# Patient Record
Sex: Male | Born: 1950 | Race: White | Hispanic: No | Marital: Married | Smoking: Former smoker
Health system: Southern US, Community
[De-identification: ages and names within clinical notes are randomized; demographics above are authoritative.]

## PROBLEM LIST (undated history)

## (undated) DIAGNOSIS — F419 Anxiety disorder, unspecified: Secondary | ICD-10-CM

## (undated) DIAGNOSIS — E785 Hyperlipidemia, unspecified: Secondary | ICD-10-CM

## (undated) DIAGNOSIS — J449 Chronic obstructive pulmonary disease, unspecified: Secondary | ICD-10-CM

## (undated) DIAGNOSIS — F329 Major depressive disorder, single episode, unspecified: Secondary | ICD-10-CM

## (undated) DIAGNOSIS — F32A Depression, unspecified: Secondary | ICD-10-CM

## (undated) DIAGNOSIS — N189 Chronic kidney disease, unspecified: Secondary | ICD-10-CM

## (undated) DIAGNOSIS — C801 Malignant (primary) neoplasm, unspecified: Secondary | ICD-10-CM

## (undated) DIAGNOSIS — I10 Essential (primary) hypertension: Secondary | ICD-10-CM

## (undated) DIAGNOSIS — E119 Type 2 diabetes mellitus without complications: Secondary | ICD-10-CM

## (undated) HISTORY — DX: Hyperlipidemia, unspecified: E78.5

## (undated) HISTORY — PX: EYE SURGERY: SHX253

## (undated) HISTORY — PX: MANDIBLE SURGERY: SHX707

## (undated) HISTORY — DX: Chronic obstructive pulmonary disease, unspecified: J44.9

## (undated) HISTORY — DX: Essential (primary) hypertension: I10

## (undated) HISTORY — DX: Chronic kidney disease, unspecified: N18.9

## (undated) HISTORY — DX: Depression, unspecified: F32.A

## (undated) HISTORY — DX: Type 2 diabetes mellitus without complications: E11.9

## (undated) HISTORY — DX: Major depressive disorder, single episode, unspecified: F32.9

## (undated) HISTORY — DX: Anxiety disorder, unspecified: F41.9

---

## 2011-10-24 ENCOUNTER — Ambulatory Visit: Payer: Self-pay | Admitting: Cardiovascular Disease

## 2012-03-16 ENCOUNTER — Ambulatory Visit: Payer: Self-pay | Admitting: Ophthalmology

## 2014-02-03 LAB — LIPID PANEL
Cholesterol: 152 mg/dL (ref 0–200)
HDL: 29 mg/dL — AB (ref 35–70)
LDL CALC: 87 mg/dL
Triglycerides: 183 mg/dL — AB (ref 40–160)

## 2014-02-03 LAB — BASIC METABOLIC PANEL
BUN: 27 mg/dL — AB (ref 4–21)
Creatinine: 1.5 mg/dL — AB (ref 0.6–1.3)
GLUCOSE: 128 mg/dL
Potassium: 5.2 mmol/L (ref 3.4–5.3)
Sodium: 140 mmol/L (ref 137–147)

## 2014-02-03 LAB — HEMOGLOBIN A1C: Hgb A1c MFr Bld: 6.3 % — AB (ref 4.0–6.0)

## 2014-04-24 ENCOUNTER — Inpatient Hospital Stay: Payer: Self-pay | Admitting: Internal Medicine

## 2014-04-24 LAB — BASIC METABOLIC PANEL
Anion Gap: 11 (ref 7–16)
BUN: 22 mg/dL — AB (ref 7–18)
CALCIUM: 8.8 mg/dL (ref 8.5–10.1)
Chloride: 105 mmol/L (ref 98–107)
Co2: 22 mmol/L (ref 21–32)
Creatinine: 1.72 mg/dL — ABNORMAL HIGH (ref 0.60–1.30)
EGFR (African American): 52 — ABNORMAL LOW
GFR CALC NON AF AMER: 43 — AB
Glucose: 240 mg/dL — ABNORMAL HIGH (ref 65–99)
OSMOLALITY: 287 (ref 275–301)
POTASSIUM: 3.7 mmol/L (ref 3.5–5.1)
SODIUM: 138 mmol/L (ref 136–145)

## 2014-04-24 LAB — CBC
HCT: 45 % (ref 40.0–52.0)
HGB: 14.7 g/dL (ref 13.0–18.0)
MCH: 30.7 pg (ref 26.0–34.0)
MCHC: 32.6 g/dL (ref 32.0–36.0)
MCV: 94 fL (ref 80–100)
Platelet: 311 10*3/uL (ref 150–440)
RBC: 4.78 10*6/uL (ref 4.40–5.90)
RDW: 13.3 % (ref 11.5–14.5)
WBC: 20 10*3/uL — ABNORMAL HIGH (ref 3.8–10.6)

## 2014-04-24 LAB — TROPONIN I

## 2014-04-25 LAB — CBC WITH DIFFERENTIAL/PLATELET
BASOS PCT: 0.1 %
Basophil #: 0 10*3/uL (ref 0.0–0.1)
EOS ABS: 0 10*3/uL (ref 0.0–0.7)
Eosinophil %: 0 %
HCT: 40.4 % (ref 40.0–52.0)
HGB: 12.8 g/dL — ABNORMAL LOW (ref 13.0–18.0)
Lymphocyte #: 0.6 10*3/uL — ABNORMAL LOW (ref 1.0–3.6)
Lymphocyte %: 3.3 %
MCH: 30.6 pg (ref 26.0–34.0)
MCHC: 31.8 g/dL — ABNORMAL LOW (ref 32.0–36.0)
MCV: 96 fL (ref 80–100)
Monocyte #: 0.6 x10 3/mm (ref 0.2–1.0)
Monocyte %: 3.5 %
Neutrophil #: 16.5 10*3/uL — ABNORMAL HIGH (ref 1.4–6.5)
Neutrophil %: 93.1 %
Platelet: 255 10*3/uL (ref 150–440)
RBC: 4.2 10*6/uL — AB (ref 4.40–5.90)
RDW: 13.3 % (ref 11.5–14.5)
WBC: 17.7 10*3/uL — AB (ref 3.8–10.6)

## 2014-04-25 LAB — BASIC METABOLIC PANEL
Anion Gap: 4 — ABNORMAL LOW (ref 7–16)
BUN: 29 mg/dL — ABNORMAL HIGH (ref 7–18)
CREATININE: 1.5 mg/dL — AB (ref 0.60–1.30)
Calcium, Total: 8.4 mg/dL — ABNORMAL LOW (ref 8.5–10.1)
Chloride: 111 mmol/L — ABNORMAL HIGH (ref 98–107)
Co2: 24 mmol/L (ref 21–32)
GLUCOSE: 177 mg/dL — AB (ref 65–99)
Osmolality: 288 (ref 275–301)
POTASSIUM: 3.6 mmol/L (ref 3.5–5.1)
SODIUM: 139 mmol/L (ref 136–145)

## 2014-05-01 LAB — BASIC METABOLIC PANEL
BUN: 21 mg/dL (ref 4–21)
Creatinine: 1.2 mg/dL (ref 0.6–1.3)
Glucose: 165 mg/dL
Potassium: 5 mmol/L (ref 3.4–5.3)
SODIUM: 139 mmol/L (ref 137–147)

## 2014-05-01 LAB — CBC AND DIFFERENTIAL
HCT: 43 % (ref 41–53)
Hemoglobin: 14.3 g/dL (ref 13.5–17.5)
Neutrophils Absolute: 11 /uL
Platelets: 277 10*3/uL (ref 150–399)
WBC: 14.5 10^3/mL

## 2014-05-01 LAB — HEPATIC FUNCTION PANEL
ALK PHOS: 42 U/L (ref 25–125)
ALT: 18 U/L (ref 10–40)
AST: 16 U/L (ref 14–40)

## 2014-05-01 LAB — HEMOGLOBIN A1C: Hgb A1c MFr Bld: 6.7 % — AB (ref 4.0–6.0)

## 2014-07-12 ENCOUNTER — Ambulatory Visit: Payer: Self-pay | Admitting: Ophthalmology

## 2014-08-15 LAB — BASIC METABOLIC PANEL WITH GFR
BUN: 34 mg/dL — AB (ref 4–21)
Creatinine: 1.9 mg/dL — AB (ref 0.6–1.3)
Glucose: 251 mg/dL
Potassium: 4.7 mmol/L (ref 3.4–5.3)
Sodium: 134 mmol/L — AB (ref 137–147)

## 2014-08-21 ENCOUNTER — Encounter: Payer: Self-pay | Admitting: *Deleted

## 2014-09-07 ENCOUNTER — Ambulatory Visit: Payer: Self-pay | Admitting: Endocrinology

## 2014-11-11 NOTE — Discharge Summary (Signed)
PATIENT NAME:  Ralph Lopez, MOLL MR#:  [id number] DATE OF BIRTH:  14-Mar-1951  DATE OF ADMISSION:  04/24/2014 DATE OF DISCHARGE:  04/25/2014  DISCHARGE DIAGNOSES:  1.  Chronic obstructive pulmonary disease exacerbation.   2.  Hypertension.  3.  Acute on chronic renal failure with chronic kidney disease stage III.   4.  Type 2 diabetes mellitus with nephropathy.  5.  Essential hypertension.   DISCHARGE MEDICATIONS:  Aspirin 81 mg p.o. daily, ProAir 90 mcg 2 puffs 4 times daily, atorvastatin 10 mg p.o. daily, amlodipine 5 mg p.o. daily, pantoprazole 40 mg p.o. daily, HCTZ 25 mg p.o. daily, atenolol 100 mg p.o. daily, Spiriva 18 mcg inhalation daily, lisinopril 40 mg p.o. daily, prednisone 20 mg 3 tablets daily for 3 days, 2 tablets daily for 3 days, 1 tablet daily for 3 days, then stop, azithromycin 500 mg daily for 3 days, Augmentin 875-125 mg p.o. b.i.d. for 10 days.   DIET: Low-sodium, low-fat diet.     Note that the patient metformin was stopped with primary doctor a week ago because of renal failure. The patient right now is not on any diabetes medication, so I advised him to check with his primary doctor in 2 days and possibly start insulin.  The patient's sugars were high here, but it is due to steroids. He does have a glucometer, I advised him to check blood sugar at least 2 times a day and keep the log and take it to the primary doctor. The patient had appointment made with his primary doctor on October 12 at 10:45.     HOSPITAL COURSE: The patient is a 64 year old male patient with tobacco abuse, comes in because of trouble breathing, wheezing, and cough. Look at history and physical for full details on October 5.   1.  The patient's chest x-ray did not show any pneumonia. The patient was started on BiPAP because of shortness of breath and wheezing. The patient's white count was 20,000 on admission. The patient admitted to hospitalist service for COPD exacerbation and the patient was able to  come off the BiPAP and he came to the floor. We continued Rocephin, Zithromax, Solu-Medrol, and Duonebs.  Yesterday the patient's wheezing got better, white count dropped to 17, and his O2 saturation improved nicely. The patient did not require any further oxygen. The patient's O2 saturations were 94% on room air at rest and 90% with exertion. He is discharged home with prednisone, antibiotics, and also nebulizers. He is advised to follow with primary doctor to set up pulmonology appointment and pulmonary function testing as outpatient.  We have added Spiriva to his medications needed diet. I advised him to quit smoking.  2.  Acute on chronic renal failure, CKD stage III , secondary to diabetic nephropathy. The patient's kidney function showed his creatinine 1.72 on October 5, BUN 22. The patient was given gentle hydration. His creatinine improved to 1.5 on October 6.  3.  The patient's sugars have been a little bit high at 190 and 200s. He is on steroids. I told him to continue to wean off steroids and then talk to his primary doctor to restart possibly other medications like glipizide or insulin. He is off metformin recently a week ago secondary to renal failure.   DISCHARGE VITAL SIGNS: Temperature 97.7, heart rate 89, blood pressure 112/69, saturation 94% on room air at rest and 93% on exertion.   TIME SPENT: More than 30 minutes.     ____________________________ Epifanio Lesches, MD  sk:bu D: 04/26/2014 12:39:04 ET T: 04/26/2014 15:16:10 ET JOB#: [id number]  cc: Epifanio Lesches, MD, <Dictator> Epifanio Lesches MD ELECTRONICALLY SIGNED 05/13/2014 18:30

## 2014-11-11 NOTE — H&P (Signed)
PATIENT NAME:  DEMONTAY, GRANTHAM MR#:  [id number] DATE OF BIRTH:  28-Feb-1951  DATE OF ADMISSION:  04/24/2014  PRIMARY CARE PHYSICIAN:  None.  REFERRING PHYSICIAN:     CHIEF COMPLAINT:  Shortness of breath.  HISTORY OF PRESENT ILLNESS:  Mr. Badeaux is a 64 year old male with past medical history of diabetes mellitus diet controlled, hypertension, hyperlipidemia, and COPD, who comes to the Emergency Department with cough and shortness of breath for the last 2 days. The cough gradually worsened, associated with severe shortness of breath. Denies having any fever. Concerning this, he came to the Emergency Department. The patient was diffusely wheezing. The patient received multiple breathing treatments without much improvement. The patient was placed on BiPAP. ABG showed pH of 7.30, pCO2 of 43. Chest x-ray does not show any infiltrates. The patient received azithromycin by the Emergency Department physician. The patient was also found to have elevated white blood cell count of 20,000.   PAST MEDICAL HISTORY:   1.  Hypertension. 2.  Diabetes mellitus. 3.  COPD. 4.  Gastroesophageal reflux disease.  ALLERGIES:  No known drug allergies.  HOME MEDICATIONS: 1.  Spiriva 18 mcg once a day. 2.  ProAir 2 puffs 4 times a day. 3.  Protonix 40 mg once a day. 4.  Lisinopril 40 mg once a day. 5.  Hydrochlorothiazide 25 mg once a day. 6.  Atorvastatin 10 mg once a day. 7.  Atenolol 100 mg once a day. 8.  Aspirin 81 mg daily. 9.  Amlodipine 5 mg once a day.  SOCIAL HISTORY:  Continues to smoke 1-1/2 packs a day. Denies drinking alcohol or using illicit drugs. Currently married, lives with his wife.   FAMILY HISTORY:  Hypertension and diabetes mellitus.  REVIEW OF SYSTEMS: CONSTITUTIONAL:  Experiencing generalized weakness. EYES:  No change in vision. EARS, NOSE, AND THROAT:  No change in hearing. RESPIRATORY:  Has cough and shortness of breath. CARDIOVASCULAR:  No chest pain or  palpitations. GASTROINTESTINAL:  No nausea, vomiting, or abdominal pain. GENITOURINARY:  No dysuria or hematuria. HEMATOLOGIC:  No easy bruising or bleeding. SKIN:  No rash or lesions. MUSCULOSKELETAL:  No joint pains and aches. NEUROLOGIC:  No weakness or numbness in any part of the body.  PHYSICAL EXAMINATION: GENERAL:  Well-built, well-nourished, age-appropriate male lying down in the bed not in distress. VITAL SIGNS:  Temperature 98, pulse 132, blood pressure 117/79, respiratory rate 26, oxygen saturation 100% on BiPAP. HEENT:  Head is normocephalic and atraumatic. There is no scleral icterus. Conjunctivae are normal. Pupils are equal, round, and react to light. Mucous membranes are moist. No pharyngeal erythema.  NECK:  Supple. No lymphadenopathy. No JVD. No carotid bruit. CHEST:  Has no focal tenderness. Bilateral decreased wheezing. HEART:  S1, S2 regular. No murmurs are heard. ABDOMEN:  Bowel sounds are present. Soft, nontender, nondistended.  EXTREMITIES:  No pedal edema. Pulses are 2+.  NEUROLOGIC:  The patient is alert and oriented to place, person, and time. Cranial nerves II through XII are intact. Motor is 5/5 in upper and lower extremities.   LABORATORY DATA:  CMP:  BUN 22, creatinine 1.72, glucose 240. CBC:  WBC 20,000, hemoglobin 14, platelet count 311.  ASSESSMENT AND PLAN:  Mr. Elahi is a 64 year old male with a history of heavy smoking, who comes to the Emergency Department with chronic obstructive pulmonary disease exacerbation.   1.  Chronic obstructive pulmonary disease exacerbation. Will continue the breathing treatments and Solu-Medrol. The patient is able to speak in full sentences.  The patient has bilateral diffuse wheezing and has air entry bilaterally. Will admit the patient to a medical bed. Continue DuoNeb and Solu-Medrol, also Rocephin and Zithromax concerning the patient's elevated white blood cell count. Chest x-ray does not show any infiltrates.  2.   Hypertension. Continue the home medications and hold hydrochlorothiazide.  3.  Renal insufficiency. We do not have the patient's baseline. Hold the hydrochlorothiazide, give gentle hydration, and follow up, as well as hold lisinopril.  4.  Tobacco use. Counsel the patient.  5.  Keep the patient on deep vein thrombosis prophylaxis with Lovenox.  TIME SPENT:  55 minutes.   ____________________________ Monica Becton, MD pv:nb D: 04/24/2014 02:56:10 ET T: 04/24/2014 03:06:19 ET JOB#: [id number]  cc: Monica Becton, MD, <Dictator> Monica Becton MD ELECTRONICALLY SIGNED 05/03/2014 22:20

## 2015-02-15 ENCOUNTER — Telehealth: Payer: Self-pay | Admitting: Unknown Physician Specialty

## 2015-02-15 DIAGNOSIS — E1122 Type 2 diabetes mellitus with diabetic chronic kidney disease: Secondary | ICD-10-CM

## 2015-02-15 DIAGNOSIS — F419 Anxiety disorder, unspecified: Secondary | ICD-10-CM | POA: Insufficient documentation

## 2015-02-15 DIAGNOSIS — E785 Hyperlipidemia, unspecified: Secondary | ICD-10-CM

## 2015-02-15 DIAGNOSIS — E1022 Type 1 diabetes mellitus with diabetic chronic kidney disease: Secondary | ICD-10-CM

## 2015-02-15 DIAGNOSIS — E119 Type 2 diabetes mellitus without complications: Secondary | ICD-10-CM | POA: Insufficient documentation

## 2015-02-15 DIAGNOSIS — N183 Chronic kidney disease, stage 3 unspecified: Secondary | ICD-10-CM | POA: Insufficient documentation

## 2015-02-15 DIAGNOSIS — F32A Depression, unspecified: Secondary | ICD-10-CM

## 2015-02-15 DIAGNOSIS — F329 Major depressive disorder, single episode, unspecified: Secondary | ICD-10-CM

## 2015-02-15 DIAGNOSIS — I129 Hypertensive chronic kidney disease with stage 1 through stage 4 chronic kidney disease, or unspecified chronic kidney disease: Secondary | ICD-10-CM

## 2015-02-15 DIAGNOSIS — J449 Chronic obstructive pulmonary disease, unspecified: Secondary | ICD-10-CM | POA: Insufficient documentation

## 2015-02-15 NOTE — Telephone Encounter (Signed)
Routing to provider. Called and asked patient what was going on with his elbow. He stated he thinks it may be tendonitis again, he can't straighten out his arm, and it hurts very bad.

## 2015-02-15 NOTE — Telephone Encounter (Signed)
Pt requests call back. Pt having issues with his elbow, wants to know if something can be called in for him. Pharm is [facility] in [place]. Thanks.

## 2015-02-16 ENCOUNTER — Encounter: Payer: Self-pay | Admitting: Unknown Physician Specialty

## 2015-02-16 ENCOUNTER — Ambulatory Visit (INDEPENDENT_AMBULATORY_CARE_PROVIDER_SITE_OTHER): Payer: BLUE CROSS/BLUE SHIELD | Admitting: Unknown Physician Specialty

## 2015-02-16 VITALS — BP 139/81 | HR 73 | Temp 97.6°F | Ht 69.7 in | Wt 171.8 lb

## 2015-02-16 DIAGNOSIS — M7702 Medial epicondylitis, left elbow: Secondary | ICD-10-CM | POA: Diagnosis not present

## 2015-02-16 MED ORDER — METHYLPREDNISOLONE 4 MG PO TBPK: ORAL_TABLET | ORAL | Status: DC

## 2015-02-16 MED ORDER — HYDROCODONE-ACETAMINOPHEN 5-325 MG PO TABS: 1.0000 | ORAL_TABLET | Freq: Four times a day (QID) | ORAL | Status: DC | PRN

## 2015-02-16 NOTE — Telephone Encounter (Signed)
It sounds like tennis elbow.  I recommend ice or heat.  Aspercream might be helpful.  Needs to get a tennis elbow brace you can get at the drug store.

## 2015-02-16 NOTE — Progress Notes (Signed)
BP 139/81 mmHg  Pulse 73  Temp(Src) 97.6 F (36.4 C)  Ht 5' 9.7" (1.77 m)  Wt 171 lb 12.8 oz (77.928 kg)  BMI 24.87 kg/m2  SpO2 98%   Subjective:    Patient ID: Ralph Lopez, male    DOB: Jul 12, 1951, 64 y.o.   MRN: [id number]  HPI: Ralph Lopez is a 64 y.o. male  Chief Complaint  Patient presents with  . Elbow Pain    pain is in left elbow and pt states pain started a couple of days ago.   Left elbow pain started suddenly a couple of days ago after repetitive lifting of crates.  He took the day off from work.  He works at [employer] in [employer] and has not made a [place] claim.  Now he cannot extend elbow and is tender to touch.    Relevant past medical, surgical, family and social history reviewed and updated as indicated. Interim medical history since our last visit reviewed. Allergies and medications reviewed and updated.  Review of Systems  Per HPI unless specifically indicated above     Objective:    BP 139/81 mmHg  Pulse 73  Temp(Src) 97.6 F (36.4 C)  Ht 5' 9.7" (1.77 m)  Wt 171 lb 12.8 oz (77.928 kg)  BMI 24.87 kg/m2  SpO2 98%  Wt Readings from Last 3 Encounters:  02/16/15 171 lb 12.8 oz (77.928 kg)  11/29/14 170 lb (77.111 kg)    Physical Exam  Musculoskeletal:       Left elbow: He exhibits swelling and effusion. Tenderness found. Medial epicondyle tenderness noted.    Results for orders placed or performed in visit on 69/67/89  Basic metabolic panel  Result Value Ref Range   Glucose 251 mg/dL   BUN 34 (A) 4 - 21 mg/dL   Creatinine 1.9 (A) 0.6 - 1.3 mg/dL   Potassium 4.7 3.4 - 5.3 mmol/L   Sodium 134 (A) 137 - 147 mmol/L  CBC and differential  Result Value Ref Range   Hemoglobin 14.3 13.5 - 17.5 g/dL   HCT 43 41 - 53 %   Neutrophils Absolute 11 /L   Platelets 277 150 - 399 K/L   WBC 14.5 38^1/OF  Basic metabolic panel  Result Value Ref Range   Glucose 165 mg/dL   BUN 21 4 - 21 mg/dL   Creatinine 1.2 0.6 - 1.3 mg/dL   Potassium 5.0 3.4 -  5.3 mmol/L   Sodium 139 137 - 147 mmol/L  Hepatic function panel  Result Value Ref Range   Alkaline Phosphatase 42 25 - 125 U/L   ALT 18 10 - 40 U/L   AST 16 14 - 40 U/L  Hemoglobin A1c  Result Value Ref Range   Hgb A1c MFr Bld 6.7 (A) 4.0 - 6.0 %  Basic metabolic panel  Result Value Ref Range   Glucose 128 mg/dL   BUN 27 (A) 4 - 21 mg/dL   Creatinine 1.5 (A) 0.6 - 1.3 mg/dL   Potassium 5.2 3.4 - 5.3 mmol/L   Sodium 140 137 - 147 mmol/L  Lipid panel  Result Value Ref Range   Triglycerides 183 (A) 40 - 160 mg/dL   Cholesterol 152 0 - 200 mg/dL   HDL 29 (A) 35 - 70 mg/dL   LDL Cholesterol 87 mg/dL  Hemoglobin A1c  Result Value Ref Range   Hgb A1c MFr Bld 6.3 (A) 4.0 - 6.0 %      Assessment & Plan:  Problem List Items Addressed This Visit    None    Visit Diagnoses    Epicondylitis elbow, medial, left    -  Primary    This seems to be a repetitive work injury.  Needs to report it for workmen's comp to get the appropriate work accomodations needed until this heals.      Relevant Medications    methylPREDNISolone (MEDROL DOSEPAK) 4 MG TBPK tablet    HYDROcodone-acetaminophen (NORCO/VICODIN) 5-325 MG per tablet        Follow up plan: Return Workman's comp doctor.

## 2015-02-16 NOTE — Telephone Encounter (Signed)
Patient came in for an appointment and I let him know what Malachy Mood suggested then.

## 2015-02-16 NOTE — Patient Instructions (Addendum)
Talk to your supervisor about a Workman's comp claim so appropriate work accomodations can be made.   Use a sling

## 2015-02-19 ENCOUNTER — Ambulatory Visit: Payer: Self-pay | Admitting: Unknown Physician Specialty

## 2015-03-04 ENCOUNTER — Other Ambulatory Visit: Payer: Self-pay | Admitting: Unknown Physician Specialty

## 2015-03-08 ENCOUNTER — Other Ambulatory Visit: Payer: Self-pay | Admitting: Unknown Physician Specialty

## 2015-03-27 ENCOUNTER — Other Ambulatory Visit: Payer: Self-pay | Admitting: Unknown Physician Specialty

## 2015-05-01 ENCOUNTER — Other Ambulatory Visit: Payer: Self-pay | Admitting: Unknown Physician Specialty

## 2015-06-01 ENCOUNTER — Other Ambulatory Visit: Payer: Self-pay | Admitting: Unknown Physician Specialty

## 2015-06-25 ENCOUNTER — Other Ambulatory Visit: Payer: Self-pay | Admitting: Unknown Physician Specialty

## 2015-07-03 ENCOUNTER — Ambulatory Visit (INDEPENDENT_AMBULATORY_CARE_PROVIDER_SITE_OTHER): Payer: BLUE CROSS/BLUE SHIELD | Admitting: Unknown Physician Specialty

## 2015-07-03 ENCOUNTER — Encounter: Payer: Self-pay | Admitting: Unknown Physician Specialty

## 2015-07-03 VITALS — BP 138/82 | HR 73 | Temp 98.3°F | Ht 69.7 in | Wt 170.8 lb

## 2015-07-03 DIAGNOSIS — I129 Hypertensive chronic kidney disease with stage 1 through stage 4 chronic kidney disease, or unspecified chronic kidney disease: Secondary | ICD-10-CM

## 2015-07-03 DIAGNOSIS — Z794 Long term (current) use of insulin: Secondary | ICD-10-CM | POA: Diagnosis not present

## 2015-07-03 DIAGNOSIS — N183 Chronic kidney disease, stage 3 unspecified: Secondary | ICD-10-CM

## 2015-07-03 DIAGNOSIS — E785 Hyperlipidemia, unspecified: Secondary | ICD-10-CM | POA: Diagnosis not present

## 2015-07-03 DIAGNOSIS — Z23 Encounter for immunization: Secondary | ICD-10-CM | POA: Diagnosis not present

## 2015-07-03 DIAGNOSIS — E1122 Type 2 diabetes mellitus with diabetic chronic kidney disease: Secondary | ICD-10-CM

## 2015-07-03 LAB — LIPID PANEL PICCOLO, WAIVED
CHOLESTEROL PICCOLO, WAIVED: 135 mg/dL (ref <200)
Chol/HDL Ratio Piccolo,Waive: 3.9 mg/dL
HDL CHOL PICCOLO, WAIVED: 35 mg/dL — AB (ref >59)
LDL CHOL CALC PICCOLO WAIVED: 81 mg/dL (ref <100)
Triglycerides Piccolo,Waived: 94 mg/dL (ref <150)
VLDL Chol Calc Piccolo,Waive: 19 mg/dL (ref <30)

## 2015-07-03 LAB — MICROALBUMIN, URINE WAIVED
CREATININE, URINE WAIVED: 200 mg/dL (ref 10–300)
Microalb, Ur Waived: 80 mg/L — ABNORMAL HIGH (ref 0–19)

## 2015-07-03 LAB — BAYER DCA HB A1C WAIVED: HB A1C (BAYER DCA - WAIVED): 6.3 % (ref <7.0)

## 2015-07-03 NOTE — Progress Notes (Signed)
BP 138/82 mmHg  Pulse 73  Temp(Src) 98.3 F (36.8 C)  Ht 5' 9.7" (1.77 m)  Wt 170 lb 12.8 oz (77.474 kg)  BMI 24.73 kg/m2  SpO2 98%   Subjective:    Patient ID: Ralph Lopez, male    DOB: 1951-05-26, 64 y.o.   MRN: [id number]  HPI: Ralph Lopez is a 64 y.o. male  Chief Complaint  Patient presents with  . Diabetes  . Hyperlipidemia  . Hypertension   Diabetes:  Using medications without difficulties Taking 30 units of Toujeo/day No hypoglycemic episodes No hyperglycemic episodes Feet problems: none Blood Sugars averaging: BS 119 this AM and typically there.   eye exam within last year  Hypertension:  Using medications without difficulty  Using medication without problems or lightheadedness No chest pain with exertion or shortness of breath No Edema  Elevated Cholesterol Using medications without problems: No Muscle aches  Diet compliance: OK Exercise: works a physical job    Relevant past medical, surgical, family and social history reviewed and updated as indicated. Interim medical history since our last visit reviewed. Allergies and medications reviewed and updated.  Review of Systems  Per HPI unless specifically indicated above     Objective:    BP 138/82 mmHg  Pulse 73  Temp(Src) 98.3 F (36.8 C)  Ht 5' 9.7" (1.77 m)  Wt 170 lb 12.8 oz (77.474 kg)  BMI 24.73 kg/m2  SpO2 98%  Wt Readings from Last 3 Encounters:  07/03/15 170 lb 12.8 oz (77.474 kg)  02/16/15 171 lb 12.8 oz (77.928 kg)  11/29/14 170 lb (77.111 kg)    Physical Exam  Constitutional: He is oriented to person, place, and time. He appears well-developed and well-nourished. No distress.  HENT:  Head: Normocephalic and atraumatic.  Eyes: Conjunctivae and lids are normal. Right eye exhibits no discharge. Left eye exhibits no discharge. No scleral icterus.  Neck: Normal range of motion. Neck supple. No JVD present. Carotid bruit is not present.  Cardiovascular: Normal rate,  regular rhythm and normal heart sounds.   Pulmonary/Chest: Effort normal and breath sounds normal. No respiratory distress.  Abdominal: Normal appearance. There is no splenomegaly or hepatomegaly.  Musculoskeletal: Normal range of motion.  Neurological: He is alert and oriented to person, place, and time.  Skin: Skin is warm, dry and intact. No rash noted. No pallor.  Psychiatric: He has a normal mood and affect. His behavior is normal. Judgment and thought content normal.     Assessment & Plan:   Problem List Items Addressed This Visit      Unprioritized   CKD (chronic kidney disease), stage III   Relevant Orders   Comprehensive metabolic panel   Hypertensive CKD (chronic kidney disease)   Relevant Orders   Microalbumin, Urine Waived   Uric acid   Hyperlipidemia    LDL is 81      Relevant Medications   atorvastatin (LIPITOR) 10 MG tablet   Other Relevant Orders   Comprehensive metabolic panel   Lipid Panel Piccolo, Waived   Diabetes ([place])    Hgb A1C is 6.3      Relevant Medications   atorvastatin (LIPITOR) 10 MG tablet   Other Relevant Orders   Comprehensive metabolic panel   Bayer DCA Hb A1c Waived    Other Visit Diagnoses    Immunization due    -  Primary    Relevant Orders    Flu Vaccine QUAD 36+ mos IM (Completed)  All diagnosis stable.  Continue present treatment Follow up plan: Return in about 6 months (around 01/01/2016) for physical.

## 2015-07-03 NOTE — Assessment & Plan Note (Signed)
LDL is 81

## 2015-07-03 NOTE — Assessment & Plan Note (Signed)
Hgb A1C is 6.3

## 2015-07-04 LAB — URIC ACID: URIC ACID: 9.1 mg/dL — AB (ref 3.7–8.6)

## 2015-07-04 LAB — COMPREHENSIVE METABOLIC PANEL
ALK PHOS: 63 IU/L (ref 39–117)
ALT: 8 IU/L (ref 0–44)
AST: 14 IU/L (ref 0–40)
Albumin/Globulin Ratio: 1.3 (ref 1.1–2.5)
Albumin: 3.9 g/dL (ref 3.6–4.8)
BILIRUBIN TOTAL: 0.2 mg/dL (ref 0.0–1.2)
BUN/Creatinine Ratio: 15 (ref 10–22)
BUN: 22 mg/dL (ref 8–27)
CHLORIDE: 105 mmol/L (ref 96–106)
CO2: 23 mmol/L (ref 18–29)
Calcium: 9.5 mg/dL (ref 8.6–10.2)
Creatinine, Ser: 1.51 mg/dL — ABNORMAL HIGH (ref 0.76–1.27)
GFR calc Af Amer: 56 mL/min/{1.73_m2} — ABNORMAL LOW (ref >59)
GFR calc non Af Amer: 48 mL/min/{1.73_m2} — ABNORMAL LOW (ref >59)
GLUCOSE: 106 mg/dL — AB (ref 65–99)
Globulin, Total: 3 g/dL (ref 1.5–4.5)
Potassium: 5.1 mmol/L (ref 3.5–5.2)
Sodium: 141 mmol/L (ref 134–144)
Total Protein: 6.9 g/dL (ref 6.0–8.5)

## 2015-07-10 ENCOUNTER — Other Ambulatory Visit: Payer: Self-pay | Admitting: Unknown Physician Specialty

## 2015-07-27 ENCOUNTER — Other Ambulatory Visit: Payer: Self-pay | Admitting: Family Medicine

## 2015-07-30 ENCOUNTER — Other Ambulatory Visit: Payer: Self-pay | Admitting: Unknown Physician Specialty

## 2015-08-28 ENCOUNTER — Other Ambulatory Visit: Payer: Self-pay | Admitting: Unknown Physician Specialty

## 2015-09-13 ENCOUNTER — Other Ambulatory Visit: Payer: Self-pay | Admitting: Unknown Physician Specialty

## 2015-09-30 ENCOUNTER — Other Ambulatory Visit: Payer: Self-pay | Admitting: Unknown Physician Specialty

## 2015-10-01 ENCOUNTER — Other Ambulatory Visit: Payer: Self-pay | Admitting: Unknown Physician Specialty

## 2015-10-01 NOTE — Telephone Encounter (Signed)
Ralph Lopez refilled his atenolol for 3 months in February, so he shouldn't be due

## 2015-11-02 ENCOUNTER — Other Ambulatory Visit: Payer: Self-pay | Admitting: Unknown Physician Specialty

## 2015-12-14 ENCOUNTER — Other Ambulatory Visit: Payer: Self-pay | Admitting: Unknown Physician Specialty

## 2015-12-18 ENCOUNTER — Telehealth: Payer: Self-pay

## 2015-12-18 NOTE — Telephone Encounter (Signed)
Patient's wife called back. She stated that they did not use this pharmacy and patient does not use any of the meds being requested.

## 2015-12-18 NOTE — Telephone Encounter (Signed)
Called to speak to patient about some refill requests we got from a pharmacy that is not listed in the chart and neither are the medications being requested. I left the patient a voicemail asking for him to please return my call.

## 2015-12-31 ENCOUNTER — Other Ambulatory Visit: Payer: Self-pay | Admitting: Unknown Physician Specialty

## 2016-01-02 ENCOUNTER — Ambulatory Visit (INDEPENDENT_AMBULATORY_CARE_PROVIDER_SITE_OTHER): Payer: BLUE CROSS/BLUE SHIELD | Admitting: Unknown Physician Specialty

## 2016-01-02 ENCOUNTER — Encounter: Payer: Self-pay | Admitting: Unknown Physician Specialty

## 2016-01-02 VITALS — BP 137/79 | HR 67 | Temp 98.4°F | Ht 69.1 in | Wt 178.0 lb

## 2016-01-02 DIAGNOSIS — E1122 Type 2 diabetes mellitus with diabetic chronic kidney disease: Secondary | ICD-10-CM

## 2016-01-02 DIAGNOSIS — N183 Chronic kidney disease, stage 3 unspecified: Secondary | ICD-10-CM | POA: Insufficient documentation

## 2016-01-02 DIAGNOSIS — Z794 Long term (current) use of insulin: Secondary | ICD-10-CM | POA: Diagnosis not present

## 2016-01-02 DIAGNOSIS — E785 Hyperlipidemia, unspecified: Secondary | ICD-10-CM | POA: Diagnosis not present

## 2016-01-02 DIAGNOSIS — J449 Chronic obstructive pulmonary disease, unspecified: Secondary | ICD-10-CM

## 2016-01-02 DIAGNOSIS — I10 Essential (primary) hypertension: Secondary | ICD-10-CM | POA: Insufficient documentation

## 2016-01-02 LAB — BAYER DCA HB A1C WAIVED: HB A1C (BAYER DCA - WAIVED): 7.8 % — ABNORMAL HIGH (ref <7.0)

## 2016-01-02 NOTE — Assessment & Plan Note (Signed)
Await lipid panel 

## 2016-01-02 NOTE — Progress Notes (Signed)
BP 137/79 mmHg  Pulse 67  Temp(Src) 98.4 F (36.9 C)  Ht 5' 9.1" (1.755 m)  Wt 178 lb (80.74 kg)  BMI 26.21 kg/m2  SpO2 96%   Subjective:    Patient ID: Ralph Lopez, male    DOB: [birth year], 65 y.o.   MRN: [id number]  HPI: Ralph Lopez is a 66 y.o. male  Chief Complaint  Patient presents with  . Diabetes    pt states last eye exam date in chart is correct  . Hyperlipidemia  . Hypertension   Diabetes: Uses Toujeo 30 units QAM Stopped Bydureon as price increased.  He took his first shot Thursday after a month No hypoglycemic episodes No hyperglycemic episodes Feet problems: none Blood Sugars averaging: It is high eye exam within last year Last Hgb A1C: 6.3  Hypertension  Using medications without difficulty Average home BPs Not checking  Using medication without problems or lightheadedness No chest pain with exertion or shortness of breath No Edema  Elevated Cholesterol Using medications without problems No Muscle aches  Diet: good Exercise: Stays active     Relevant past medical, surgical, family and social history reviewed and updated as indicated. Interim medical history since our last visit reviewed. Allergies and medications reviewed and updated.  Review of Systems  Musculoskeletal:       Complaininf of a lot of joint pain with left shoulder and bilateral knees    Per HPI unless specifically indicated above     Objective:    BP 137/79 mmHg  Pulse 67  Temp(Src) 98.4 F (36.9 C)  Ht 5' 9.1" (1.755 m)  Wt 178 lb (80.74 kg)  BMI 26.21 kg/m2  SpO2 96%  Wt Readings from Last 3 Encounters:  01/02/16 178 lb (80.74 kg)  07/03/15 170 lb 12.8 oz (77.474 kg)  02/16/15 171 lb 12.8 oz (77.928 kg)    Physical Exam  Constitutional: He is oriented to person, place, and time. He appears well-developed and well-nourished. No distress.  HENT:  Head: Normocephalic and atraumatic.  Eyes: Conjunctivae and lids are normal. Right eye exhibits no  discharge. Left eye exhibits no discharge. No scleral icterus.  Neck: Normal range of motion. Neck supple. No JVD present. Carotid bruit is not present.  Cardiovascular: Normal rate, regular rhythm and normal heart sounds.   Pulmonary/Chest: Effort normal and breath sounds normal. No respiratory distress.  Abdominal: Normal appearance. There is no splenomegaly or hepatomegaly.  Musculoskeletal: Normal range of motion.  Neurological: He is alert and oriented to person, place, and time.  Skin: Skin is warm, dry and intact. No rash noted. No pallor.  Psychiatric: He has a normal mood and affect. His behavior is normal. Judgment and thought content normal.    Results for orders placed or performed in visit on 07/03/15  Comprehensive metabolic panel  Result Value Ref Range   Glucose 106 (H) 65 - 99 mg/dL   BUN 22 8 - 27 mg/dL   Creatinine, Ser 1.51 (H) 0.76 - 1.27 mg/dL   GFR calc non Af Amer 48 (L) >59 mL/min/1.73   GFR calc Af Amer 56 (L) >59 mL/min/1.73   BUN/Creatinine Ratio 15 10 - 22   Sodium 141 134 - 144 mmol/L   Potassium 5.1 3.5 - 5.2 mmol/L   Chloride 105 96 - 106 mmol/L   CO2 23 18 - 29 mmol/L   Calcium 9.5 8.6 - 10.2 mg/dL   Total Protein 6.9 6.0 - 8.5 g/dL   Albumin 3.9 3.6 -  4.8 g/dL   Globulin, Total 3.0 1.5 - 4.5 g/dL   Albumin/Globulin Ratio 1.3 1.1 - 2.5   Bilirubin Total 0.2 0.0 - 1.2 mg/dL   Alkaline Phosphatase 63 39 - 117 IU/L   AST 14 0 - 40 IU/L   ALT 8 0 - 44 IU/L  Bayer DCA Hb A1c Waived  Result Value Ref Range   Bayer DCA Hb A1c Waived 6.3 <7.0 %  Lipid Panel Piccolo, Waived  Result Value Ref Range   Cholesterol Piccolo, Waived 135 <200 mg/dL   HDL Chol Piccolo, Waived 35 (L) >59 mg/dL   Triglycerides Piccolo,Waived 94 <150 mg/dL   Chol/HDL Ratio Piccolo,Waive 3.9 mg/dL   LDL Chol Calc Piccolo Waived 81 <100 mg/dL   VLDL Chol Calc Piccolo,Waive 19 <30 mg/dL  Microalbumin, Urine Waived  Result Value Ref Range   Microalb, Ur Waived 80 (H) 0 - 19 mg/L    Creatinine, Urine Waived 200 10 - 300 mg/dL   Microalb/Creat Ratio 30-300 (H) <30 mg/g  Uric acid  Result Value Ref Range   Uric Acid 9.1 (H) 3.7 - 8.6 mg/dL      Assessment & Plan:   Problem List Items Addressed This Visit      Unprioritized   COPD, severe ([place])    Stopped smoking and breathing is  better      Diabetes ([place]) - Primary   Relevant Orders   Comprehensive metabolic panel   Bayer DCA Hb A1c Waived   Essential hypertension    Stable, continue present medications.        Relevant Orders   Comprehensive metabolic panel   Hyperlipidemia    Await lipid panel      Relevant Orders   Lipid Panel w/o Chol/HDL Ratio       Follow up plan: Return for f/u for joint pain and also 3 months for DM.

## 2016-01-02 NOTE — Assessment & Plan Note (Signed)
Stable, continue present medications.   

## 2016-01-02 NOTE — Assessment & Plan Note (Signed)
Stopped smoking and breathing is  better

## 2016-01-02 NOTE — Assessment & Plan Note (Signed)
Hgb A1C is high today at 7.8.  However stopped bydureon and recently restarted

## 2016-01-03 ENCOUNTER — Other Ambulatory Visit: Payer: Self-pay | Admitting: Unknown Physician Specialty

## 2016-01-03 DIAGNOSIS — N183 Chronic kidney disease, stage 3 (moderate): Secondary | ICD-10-CM

## 2016-01-03 LAB — COMPREHENSIVE METABOLIC PANEL
A/G RATIO: 1.3 (ref 1.2–2.2)
ALBUMIN: 4.2 g/dL (ref 3.6–4.8)
ALT: 11 IU/L (ref 0–44)
AST: 12 IU/L (ref 0–40)
Alkaline Phosphatase: 71 IU/L (ref 39–117)
BUN / CREAT RATIO: 12 (ref 10–24)
BUN: 21 mg/dL (ref 8–27)
Bilirubin Total: 0.3 mg/dL (ref 0.0–1.2)
CALCIUM: 9.7 mg/dL (ref 8.6–10.2)
CO2: 21 mmol/L (ref 18–29)
CREATININE: 1.72 mg/dL — AB (ref 0.76–1.27)
Chloride: 102 mmol/L (ref 96–106)
GFR, EST AFRICAN AMERICAN: 48 mL/min/{1.73_m2} — AB (ref >59)
GFR, EST NON AFRICAN AMERICAN: 41 mL/min/{1.73_m2} — AB (ref >59)
GLOBULIN, TOTAL: 3.3 g/dL (ref 1.5–4.5)
Glucose: 218 mg/dL — ABNORMAL HIGH (ref 65–99)
POTASSIUM: 5.4 mmol/L — AB (ref 3.5–5.2)
SODIUM: 142 mmol/L (ref 134–144)
TOTAL PROTEIN: 7.5 g/dL (ref 6.0–8.5)

## 2016-01-03 LAB — LIPID PANEL W/O CHOL/HDL RATIO
Cholesterol, Total: 153 mg/dL (ref 100–199)
HDL: 30 mg/dL — ABNORMAL LOW (ref >39)
LDL CALC: 97 mg/dL (ref 0–99)
Triglycerides: 131 mg/dL (ref 0–149)
VLDL Cholesterol Cal: 26 mg/dL (ref 5–40)

## 2016-01-09 ENCOUNTER — Encounter: Payer: Self-pay | Admitting: Unknown Physician Specialty

## 2016-01-09 ENCOUNTER — Ambulatory Visit (INDEPENDENT_AMBULATORY_CARE_PROVIDER_SITE_OTHER): Payer: BLUE CROSS/BLUE SHIELD | Admitting: Unknown Physician Specialty

## 2016-01-09 VITALS — BP 145/87 | HR 65 | Temp 98.4°F | Ht 69.5 in | Wt 176.4 lb

## 2016-01-09 DIAGNOSIS — M6248 Contracture of muscle, other site: Secondary | ICD-10-CM

## 2016-01-09 DIAGNOSIS — M62838 Other muscle spasm: Secondary | ICD-10-CM | POA: Insufficient documentation

## 2016-01-09 MED ORDER — CYCLOBENZAPRINE HCL 10 MG PO TABS: 10.0000 mg | ORAL_TABLET | Freq: Every day | ORAL | Status: DC

## 2016-01-09 NOTE — Progress Notes (Signed)
BP 145/87 mmHg  Pulse 65  Temp(Src) 98.4 F (36.9 C)  Ht 5' 9.5" (1.765 m)  Wt 176 lb 6.4 oz (80.015 kg)  BMI 25.69 kg/m2  SpO2 97%   Subjective:    Patient ID: Ralph Lopez, male    DOB: April 11, 1951, 65 y.o.   MRN: [id number]  HPI: Ralph Lopez is a 65 y.o. male  Chief Complaint  Patient presents with  . Shoulder Pain    pt states his right shoulder has been hurting for a couple of weeks now    Shoulder pain Pt with a 2 week complaint of right shoulder.  He hasn't slept for 2 nights now.  Worse at night, but not when lying on it.  Its hurts and then is stops.  States it is a dull aching or throbbing pain.  Nothing seems to help.  Has tried ice, heat, and various rubs.    Relevant past medical, surgical, family and social history reviewed and updated as indicated. Interim medical history since our last visit reviewed. Allergies and medications reviewed and updated.  Review of Systems  Per HPI unless specifically indicated above     Objective:    BP 145/87 mmHg  Pulse 65  Temp(Src) 98.4 F (36.9 C)  Ht 5' 9.5" (1.765 m)  Wt 176 lb 6.4 oz (80.015 kg)  BMI 25.69 kg/m2  SpO2 97%  Wt Readings from Last 3 Encounters:  01/09/16 176 lb 6.4 oz (80.015 kg)  01/02/16 178 lb (80.74 kg)  07/03/15 170 lb 12.8 oz (77.474 kg)    Physical Exam  Constitutional: He is oriented to person, place, and time. He appears well-developed and well-nourished. No distress.  HENT:  Head: Normocephalic and atraumatic.  Eyes: Conjunctivae and lids are normal. Right eye exhibits no discharge. Left eye exhibits no discharge. No scleral icterus.  Cardiovascular: Normal rate.   Pulmonary/Chest: Effort normal.  Abdominal: Normal appearance. There is no splenomegaly or hepatomegaly.  Musculoskeletal: Normal range of motion.       Right shoulder: He exhibits normal range of motion, no tenderness, no bony tenderness, no swelling, no effusion, no crepitus, no deformity, no laceration, no pain,  no spasm, normal pulse and normal strength.  Tender upper trapezius right side.  Shoulder exam normal  Neurological: He is alert and oriented to person, place, and time.  Skin: Skin is intact. No rash noted. No pallor.  Psychiatric: He has a normal mood and affect. His behavior is normal. Judgment and thought content normal.    Results for orders placed or performed in visit on 01/02/16  Comprehensive metabolic panel  Result Value Ref Range   Glucose 218 (H) 65 - 99 mg/dL   BUN 21 8 - 27 mg/dL   Creatinine, Ser 1.72 (H) 0.76 - 1.27 mg/dL   GFR calc non Af Amer 41 (L) >59 mL/min/1.73   GFR calc Af Amer 48 (L) >59 mL/min/1.73   BUN/Creatinine Ratio 12 10 - 24   Sodium 142 134 - 144 mmol/L   Potassium 5.4 (H) 3.5 - 5.2 mmol/L   Chloride 102 96 - 106 mmol/L   CO2 21 18 - 29 mmol/L   Calcium 9.7 8.6 - 10.2 mg/dL   Total Protein 7.5 6.0 - 8.5 g/dL   Albumin 4.2 3.6 - 4.8 g/dL   Globulin, Total 3.3 1.5 - 4.5 g/dL   Albumin/Globulin Ratio 1.3 1.2 - 2.2   Bilirubin Total 0.3 0.0 - 1.2 mg/dL   Alkaline Phosphatase 71 39 -  117 IU/L   AST 12 0 - 40 IU/L   ALT 11 0 - 44 IU/L  Bayer DCA Hb A1c Waived  Result Value Ref Range   Bayer DCA Hb A1c Waived 7.8 (H) <7.0 %  Lipid Panel w/o Chol/HDL Ratio  Result Value Ref Range   Cholesterol, Total 153 100 - 199 mg/dL   Triglycerides 131 0 - 149 mg/dL   HDL 30 (L) >39 mg/dL   VLDL Cholesterol Cal 26 5 - 40 mg/dL   LDL Calculated 97 0 - 99 mg/dL      Assessment & Plan:   Problem List Items Addressed This Visit      Unprioritized   Trapezius muscle spasm - Primary    Appt with Dr. Wynetta Emery for further work on the muscle.  Rx for muscle relaxant at night          Follow up plan: Return for appt with Dr Wynetta Emery for OMM.

## 2016-01-09 NOTE — Assessment & Plan Note (Addendum)
Appt with Dr. Wynetta Emery for further work on the muscle.  Rx for muscle relaxant at night.  Unable to rx NSAIDs due to kidney function or steroids due to blood sugar.  Take Tylenol.

## 2016-01-10 ENCOUNTER — Encounter: Payer: Self-pay | Admitting: Unknown Physician Specialty

## 2016-01-11 ENCOUNTER — Other Ambulatory Visit: Payer: Self-pay | Admitting: Unknown Physician Specialty

## 2016-01-11 MED ORDER — TRAMADOL HCL 50 MG PO TABS: 50.0000 mg | ORAL_TABLET | Freq: Three times a day (TID) | ORAL | Status: DC | PRN

## 2016-01-17 ENCOUNTER — Ambulatory Visit (INDEPENDENT_AMBULATORY_CARE_PROVIDER_SITE_OTHER): Payer: BLUE CROSS/BLUE SHIELD | Admitting: Family Medicine

## 2016-01-17 ENCOUNTER — Encounter: Payer: Self-pay | Admitting: Family Medicine

## 2016-01-17 VITALS — BP 127/80 | HR 74 | Temp 98.4°F | Ht 68.8 in | Wt 173.0 lb

## 2016-01-17 DIAGNOSIS — M62838 Other muscle spasm: Secondary | ICD-10-CM

## 2016-01-17 DIAGNOSIS — M6248 Contracture of muscle, other site: Secondary | ICD-10-CM | POA: Diagnosis not present

## 2016-01-17 NOTE — Assessment & Plan Note (Signed)
Continue current regimen. Better. Stretches given today. If not better in 1 week, will do trigger point injection. He is aware.

## 2016-01-17 NOTE — Progress Notes (Signed)
BP 127/80 mmHg  Pulse 74  Temp(Src) 98.4 F (36.9 C)  Ht 5' 8.8" (1.748 m)  Wt 173 lb (78.472 kg)  BMI 25.68 kg/m2  SpO2 96%   Subjective:    Patient ID: Ralph Lopez, male    DOB: April 18, 1951, 65 y.o.   MRN: [id number]  HPI: Ralph Lopez is a 65 y.o. male  Chief Complaint  Patient presents with  . Back Pain   BACK PAIN Duration: 3-4 weeks Mechanism of injury: lifting Location: Right upper trap Onset: sudden Severity: mild Quality: dull and aching Frequency: intermittent especially at night Radiation: none Aggravating factors: none Alleviating factors: rest, ice, heat, laying, NSAIDs, APAP, narcotics and muscle relaxer Status: better Treatments attempted:icy hot, ice, heat, tylenol, ibuprofen, flexeril, tramadol   Relief with NSAIDs?: no Nighttime pain:  yes Paresthesias / decreased sensation:  no Bowel / bladder incontinence:  no Fevers:  no Dysuria / urinary frequency:  no  Relevant past medical, surgical, family and social history reviewed and updated as indicated. Interim medical history since our last visit reviewed. Allergies and medications reviewed and updated.  Review of Systems  Constitutional: Negative.   Respiratory: Negative.   Cardiovascular: Negative.   Gastrointestinal: Negative.   Musculoskeletal: Positive for myalgias, back pain, neck pain and neck stiffness. Negative for joint swelling, arthralgias and gait problem.  Skin: Negative.   Neurological: Negative.   Psychiatric/Behavioral: Negative.    Per HPI unless specifically indicated above     Objective:    BP 127/80 mmHg  Pulse 74  Temp(Src) 98.4 F (36.9 C)  Ht 5' 8.8" (1.748 m)  Wt 173 lb (78.472 kg)  BMI 25.68 kg/m2  SpO2 96%  Wt Readings from Last 3 Encounters:  01/17/16 173 lb (78.472 kg)  01/09/16 176 lb 6.4 oz (80.015 kg)  01/02/16 178 lb (80.74 kg)    Physical Exam  Constitutional: He is oriented to person, place, and time. He appears well-developed and  well-nourished. No distress.  HENT:  Head: Normocephalic and atraumatic.  Right Ear: Hearing normal.  Left Ear: Hearing normal.  Nose: Nose normal.  Eyes: Conjunctivae and lids are normal. Right eye exhibits no discharge. Left eye exhibits no discharge. No scleral icterus.  Cardiovascular: Normal rate, regular rhythm, normal heart sounds and intact distal pulses.  Exam reveals no gallop and no friction rub.   No murmur heard. Pulmonary/Chest: Effort normal and breath sounds normal. No respiratory distress. He has no wheezes. He has no rales. He exhibits no tenderness.  Musculoskeletal: He exhibits tenderness. He exhibits no edema.  Trigger point injection R upper trap, otherwise normal exam.   Neurological: He is alert and oriented to person, place, and time. He has normal reflexes.  Skin: Skin is warm, dry and intact. No rash noted. No erythema. No pallor.  Psychiatric: He has a normal mood and affect. His speech is normal and behavior is normal. Judgment and thought content normal. Cognition and memory are normal.  Nursing note and vitals reviewed.   Results for orders placed or performed in visit on 01/02/16  Comprehensive metabolic panel  Result Value Ref Range   Glucose 218 (H) 65 - 99 mg/dL   BUN 21 8 - 27 mg/dL   Creatinine, Ser 1.72 (H) 0.76 - 1.27 mg/dL   GFR calc non Af Amer 41 (L) >59 mL/min/1.73   GFR calc Af Amer 48 (L) >59 mL/min/1.73   BUN/Creatinine Ratio 12 10 - 24   Sodium 142 134 - 144 mmol/L  Potassium 5.4 (H) 3.5 - 5.2 mmol/L   Chloride 102 96 - 106 mmol/L   CO2 21 18 - 29 mmol/L   Calcium 9.7 8.6 - 10.2 mg/dL   Total Protein 7.5 6.0 - 8.5 g/dL   Albumin 4.2 3.6 - 4.8 g/dL   Globulin, Total 3.3 1.5 - 4.5 g/dL   Albumin/Globulin Ratio 1.3 1.2 - 2.2   Bilirubin Total 0.3 0.0 - 1.2 mg/dL   Alkaline Phosphatase 71 39 - 117 IU/L   AST 12 0 - 40 IU/L   ALT 11 0 - 44 IU/L  Bayer DCA Hb A1c Waived  Result Value Ref Range   Bayer DCA Hb A1c Waived 7.8 (H) <7.0 %   Lipid Panel w/o Chol/HDL Ratio  Result Value Ref Range   Cholesterol, Total 153 100 - 199 mg/dL   Triglycerides 131 0 - 149 mg/dL   HDL 30 (L) >39 mg/dL   VLDL Cholesterol Cal 26 5 - 40 mg/dL   LDL Calculated 97 0 - 99 mg/dL      Assessment & Plan:   Problem List Items Addressed This Visit      Musculoskeletal and Integument   Trapezius muscle spasm - Primary    Continue current regimen. Better. Stretches given today. If not better in 1 week, will do trigger point injection. He is aware.           Follow up plan: Return in about 1 week (around 01/24/2016) for trigger point injection if not better.

## 2016-01-21 ENCOUNTER — Other Ambulatory Visit: Payer: Self-pay | Admitting: Family Medicine

## 2016-01-21 ENCOUNTER — Other Ambulatory Visit: Payer: Self-pay | Admitting: Unknown Physician Specialty

## 2016-01-24 ENCOUNTER — Ambulatory Visit: Payer: BLUE CROSS/BLUE SHIELD | Admitting: Family Medicine

## 2016-02-06 ENCOUNTER — Other Ambulatory Visit: Payer: Self-pay | Admitting: Unknown Physician Specialty

## 2016-02-29 ENCOUNTER — Other Ambulatory Visit: Payer: Self-pay | Admitting: Unknown Physician Specialty

## 2016-02-29 ENCOUNTER — Other Ambulatory Visit: Payer: Self-pay | Admitting: Family Medicine

## 2016-03-03 NOTE — Telephone Encounter (Signed)
Your patient 

## 2016-03-10 ENCOUNTER — Other Ambulatory Visit: Payer: Self-pay | Admitting: Family Medicine

## 2016-03-10 NOTE — Telephone Encounter (Signed)
rx

## 2016-04-04 ENCOUNTER — Ambulatory Visit (INDEPENDENT_AMBULATORY_CARE_PROVIDER_SITE_OTHER): Payer: BLUE CROSS/BLUE SHIELD | Admitting: Unknown Physician Specialty

## 2016-04-04 ENCOUNTER — Telehealth: Payer: Self-pay

## 2016-04-04 ENCOUNTER — Encounter: Payer: Self-pay | Admitting: Unknown Physician Specialty

## 2016-04-04 ENCOUNTER — Ambulatory Visit
Admission: RE | Admit: 2016-04-04 | Discharge: 2016-04-04 | Disposition: A | Discharge status: Home / Self Care | Payer: BLUE CROSS/BLUE SHIELD | Source: Ambulatory Visit | Attending: Unknown Physician Specialty | Admitting: Unknown Physician Specialty

## 2016-04-04 VITALS — BP 124/80 | HR 94 | Temp 98.2°F | Ht 70.3 in | Wt 162.6 lb

## 2016-04-04 DIAGNOSIS — J449 Chronic obstructive pulmonary disease, unspecified: Secondary | ICD-10-CM | POA: Diagnosis not present

## 2016-04-04 DIAGNOSIS — Z23 Encounter for immunization: Secondary | ICD-10-CM | POA: Diagnosis not present

## 2016-04-04 DIAGNOSIS — R05 Cough: Secondary | ICD-10-CM

## 2016-04-04 DIAGNOSIS — E1122 Type 2 diabetes mellitus with diabetic chronic kidney disease: Secondary | ICD-10-CM | POA: Diagnosis not present

## 2016-04-04 DIAGNOSIS — N183 Chronic kidney disease, stage 3 unspecified: Secondary | ICD-10-CM

## 2016-04-04 DIAGNOSIS — R918 Other nonspecific abnormal finding of lung field: Secondary | ICD-10-CM | POA: Insufficient documentation

## 2016-04-04 DIAGNOSIS — Z794 Long term (current) use of insulin: Secondary | ICD-10-CM

## 2016-04-04 DIAGNOSIS — R059 Cough, unspecified: Secondary | ICD-10-CM

## 2016-04-04 LAB — BAYER DCA HB A1C WAIVED: HB A1C (BAYER DCA - WAIVED): 7 % — ABNORMAL HIGH (ref <7.0)

## 2016-04-04 MED ORDER — UMECLIDINIUM-VILANTEROL 62.5-25 MCG/INH IN AEPB: 1.0000 | INHALATION_SPRAY | Freq: Every day | RESPIRATORY_TRACT | Status: DC

## 2016-04-04 MED ORDER — UMECLIDINIUM-VILANTEROL 62.5-25 MCG/INH IN AEPB: 1.0000 | INHALATION_SPRAY | Freq: Every day | RESPIRATORY_TRACT | 12 refills | Status: DC

## 2016-04-04 NOTE — Assessment & Plan Note (Signed)
Hgb A1C down to 7.0 for  7.8.  Continue present medication

## 2016-04-04 NOTE — Assessment & Plan Note (Signed)
Add Anoro.  Stop [employer]

## 2016-04-04 NOTE — Progress Notes (Signed)
BP 124/80 (BP Location: Left Arm, Patient Position: Sitting, Cuff Size: Normal)   Pulse 94   Temp 98.2 F (36.8 C)   Ht 5' 10.3" (1.786 m)   Wt 162 lb 9.6 oz (73.8 kg)   SpO2 98%   BMI 23.13 kg/m    Subjective:    Patient ID: Ralph Lopez, male    DOB: 10-May-1951, 65 y.o.   MRN: [id number]  HPI: Ralph Lopez is a 65 y.o. male  Chief Complaint  Patient presents with  . Diabetes    pt states eye exam was done in the last few weeks. Will fax eye exam form to [facility] in [place].   . Hyperlipidemia  . Hypertension  . Cough    pt states he has a cough that started a few weeks ago.   COPD Feels cough is getting worse.  Taking Spireva daily and using Proair twice a day Night time symptoms: none ER visits since last visit: none Missed work or school::none Increased cough:yes Increased SOB:yes Using O2:no  Diabetes: Using medications without difficulties.  Taking 30 u daily of Toujeo No hypoglycemic episodes No hyperglycemic episodes Feet problems:none Blood Sugars averaging: 150-190 eye exam within last year Last Hgb A1C: 7.8  Hypertension  Using medications without difficulty Average home BPs not checking  Using medication without problems or lightheadedness No chest pain with exertion or shortness of breath No Edema  Elevated Cholesterol Using medications without problems No Muscle aches  Diet: watches what he eats Exercise: working        Relevant past medical, surgical, family and social history reviewed and updated as indicated. Interim medical history since our last visit reviewed. Allergies and medications reviewed and updated.  Review of Systems  Per HPI unless specifically indicated above     Objective:    BP 124/80 (BP Location: Left Arm, Patient Position: Sitting, Cuff Size: Normal)   Pulse 94   Temp 98.2 F (36.8 C)   Ht 5' 10.3" (1.786 m)   Wt 162 lb 9.6 oz (73.8 kg)   SpO2 98%   BMI 23.13 kg/m   Wt Readings from Last 3  Encounters:  04/04/16 162 lb 9.6 oz (73.8 kg)  01/17/16 173 lb (78.5 kg)  01/09/16 176 lb 6.4 oz (80 kg)    Physical Exam  Constitutional: He is oriented to person, place, and time. He appears well-developed and well-nourished. No distress.  HENT:  Head: Normocephalic and atraumatic.  Eyes: Conjunctivae and lids are normal. Right eye exhibits no discharge. Left eye exhibits no discharge. No scleral icterus.  Neck: Normal range of motion. Neck supple. No JVD present. Carotid bruit is not present.  Cardiovascular: Normal rate, regular rhythm and normal heart sounds.   Pulmonary/Chest: Effort normal and breath sounds normal. No respiratory distress.  Abdominal: Normal appearance. There is no splenomegaly or hepatomegaly.  Musculoskeletal: Normal range of motion.  Neurological: He is alert and oriented to person, place, and time.  Skin: Skin is warm, dry and intact. No rash noted. No pallor.  Psychiatric: He has a normal mood and affect. His behavior is normal. Judgment and thought content normal.    Results for orders placed or performed in visit on 01/02/16  Comprehensive metabolic panel  Result Value Ref Range   Glucose 218 (H) 65 - 99 mg/dL   BUN 21 8 - 27 mg/dL   Creatinine, Ser 1.72 (H) 0.76 - 1.27 mg/dL   GFR calc non Af Amer 41 (L) >59 mL/min/1.73  GFR calc Af Amer 48 (L) >59 mL/min/1.73   BUN/Creatinine Ratio 12 10 - 24   Sodium 142 134 - 144 mmol/L   Potassium 5.4 (H) 3.5 - 5.2 mmol/L   Chloride 102 96 - 106 mmol/L   CO2 21 18 - 29 mmol/L   Calcium 9.7 8.6 - 10.2 mg/dL   Total Protein 7.5 6.0 - 8.5 g/dL   Albumin 4.2 3.6 - 4.8 g/dL   Globulin, Total 3.3 1.5 - 4.5 g/dL   Albumin/Globulin Ratio 1.3 1.2 - 2.2   Bilirubin Total 0.3 0.0 - 1.2 mg/dL   Alkaline Phosphatase 71 39 - 117 IU/L   AST 12 0 - 40 IU/L   ALT 11 0 - 44 IU/L  Bayer DCA Hb A1c Waived  Result Value Ref Range   Bayer DCA Hb A1c Waived 7.8 (H) <7.0 %  Lipid Panel w/o Chol/HDL Ratio  Result Value Ref  Range   Cholesterol, Total 153 100 - 199 mg/dL   Triglycerides 131 0 - 149 mg/dL   HDL 30 (L) >39 mg/dL   VLDL Cholesterol Cal 26 5 - 40 mg/dL   LDL Calculated 97 0 - 99 mg/dL      Assessment & Plan:   Problem List Items Addressed This Visit      Unprioritized   CKD (chronic kidney disease), stage III   Relevant Orders   Comprehensive metabolic panel   COPD, severe (HCC)    Add Anoro.  Stop Spireva      Relevant Medications   umeclidinium-vilanterol (ANORO ELLIPTA) 62.5-25 MCG/INH 1 puff   umeclidinium-vilanterol (ANORO ELLIPTA) 62.5-25 MCG/INH AEPB   Other Relevant Orders   DG Chest 2 View   Type 2 diabetes mellitus with stage 3 chronic kidney disease, with long-term current use of insulin (HCC)    Hgb A1C down to 7.0 for  7.8.  Continue present medication      Relevant Orders   Comprehensive metabolic panel   Bayer DCA Hb A1c Waived    Other Visit Diagnoses    Need for pneumococcal vaccination    -  Primary   Relevant Orders   Pneumococcal conjugate vaccine 13-valent IM (Completed)   Immunization due       Relevant Orders   Flu vaccine HIGH DOSE PF (Completed)   Cough       Chest x-ray.  Order low dose CT for smoking history       Follow up plan: Return in about 3 months (around 07/04/2016).

## 2016-04-04 NOTE — Telephone Encounter (Signed)
[facility] called and wanted to give report on patient. They stated that the patient's chest x-ray showed a larger right hilar mass with RUL atelectasis. A Chest CT is recommended for further evaluation for malignancy.

## 2016-04-04 NOTE — Patient Instructions (Addendum)
Influenza (Flu) Vaccine (Inactivated or Recombinant):  1. Why get vaccinated? Influenza ("flu") is a contagious disease that spreads around the [place] every year, usually between October and May. Flu is caused by influenza viruses, and is spread mainly by coughing, sneezing, and close contact. Anyone can get flu. Flu strikes suddenly and can last several days. Symptoms vary by age, but can include:  fever/chills  sore throat  muscle aches  fatigue  cough  headache  runny or stuffy nose Flu can also lead to pneumonia and blood infections, and cause diarrhea and seizures in children. If you have a medical condition, such as heart or lung disease, flu can make it worse. Flu is more dangerous for some people. Infants and young children, people 65 years of age and older, pregnant women, and people with certain health conditions or a weakened immune system are at greatest risk. Each year thousands of people in the [place] die from flu, and many more are hospitalized. Flu vaccine can:  keep you from getting flu,  make flu less severe if you do get it, and  keep you from spreading flu to your family and other people. 2. Inactivated and recombinant flu vaccines A dose of flu vaccine is recommended every flu season. Children 6 months through 8 years of age may need two doses during the same flu season. Everyone else needs only one dose each flu season. Some inactivated flu vaccines contain a very small amount of a mercury-based preservative called thimerosal. Studies have not shown thimerosal in vaccines to be harmful, but flu vaccines that do not contain thimerosal are available. There is no live flu virus in flu shots. They cannot cause the flu. There are many flu viruses, and they are always changing. Each year a new flu vaccine is made to protect against three or four viruses that are likely to cause disease in the upcoming flu season. But even when the vaccine doesn't exactly  match these viruses, it may still provide some protection. Flu vaccine cannot prevent:  flu that is caused by a virus not covered by the vaccine, or  illnesses that look like flu but are not. It takes about 2 weeks for protection to develop after vaccination, and protection lasts through the flu season. 3. Some people should not get this vaccine Tell the person who is giving you the vaccine:  If you have any severe, life-threatening allergies. If you ever had a life-threatening allergic reaction after a dose of flu vaccine, or have a severe allergy to any part of this vaccine, you may be advised not to get vaccinated. Most, but not all, types of flu vaccine contain a small amount of egg protein.  If you ever had Guillain-Barre Syndrome (also called GBS). Some people with a history of GBS should not get this vaccine. This should be discussed with your doctor.  If you are not feeling well. It is usually okay to get flu vaccine when you have a mild illness, but you might be asked to come back when you feel better. 4. Risks of a vaccine reaction With any medicine, including vaccines, there is a chance of reactions. These are usually mild and go away on their own, but serious reactions are also possible. Most people who get a flu shot do not have any problems with it. Minor problems following a flu shot include:  soreness, redness, or swelling where the shot was given  hoarseness  sore, red or itchy eyes  cough    fever  aches  headache  itching  fatigue If these problems occur, they usually begin soon after the shot and last 1 or 2 days. More serious problems following a flu shot can include the following:  There may be a small increased risk of Guillain-Barre Syndrome (GBS) after inactivated flu vaccine. This risk has been estimated at 1 or 2 additional cases per million people vaccinated. This is much lower than the risk of severe complications from flu, which can be prevented by  flu vaccine.  Young children who get the flu shot along with pneumococcal vaccine (PCV13) and/or DTaP vaccine at the same time might be slightly more likely to have a seizure caused by fever. Ask your doctor for more information. Tell your doctor if a child who is getting flu vaccine has ever had a seizure. Problems that could happen after any injected vaccine:  People sometimes faint after a medical procedure, including vaccination. Sitting or lying down for about 15 minutes can help prevent fainting, and injuries caused by a fall. Tell your doctor if you feel dizzy, or have vision changes or ringing in the ears.  Some people get severe pain in the shoulder and have difficulty moving the arm where a shot was given. This happens very rarely.  Any medication can cause a severe allergic reaction. Such reactions from a vaccine are very rare, estimated at about 1 in a million doses, and would happen within a few minutes to a few hours after the vaccination. As with any medicine, there is a very remote chance of a vaccine causing a serious injury or death. The safety of vaccines is always being monitored. For more information, visit: www.cdc.gov/vaccinesafety/ 5. What if there is a serious reaction? What should I look for?  Look for anything that concerns you, such as signs of a severe allergic reaction, very high fever, or unusual behavior. Signs of a severe allergic reaction can include hives, swelling of the face and throat, difficulty breathing, a fast heartbeat, dizziness, and weakness. These would start a few minutes to a few hours after the vaccination. What should I do?  If you think it is a severe allergic reaction or other emergency that can't wait, call 9-1-1 and get the person to the nearest hospital. Otherwise, call your doctor.  Reactions should be reported to the Vaccine Adverse Event Reporting System (VAERS). Your doctor should file this report, or you can do it yourself through the  VAERS web site at www.vaers.hhs.gov, or by calling 1-800-822-7967. VAERS does not give medical advice. 6. The National Vaccine Injury Compensation Program The National Vaccine Injury Compensation Program (VICP) is a federal program that was created to compensate people who may have been injured by certain vaccines. Persons who believe they may have been injured by a vaccine can learn about the program and about filing a claim by calling 1-800-338-2382 or visiting the VICP website at www.hrsa.gov/vaccinecompensation. There is a time limit to file a claim for compensation. 7. How can I learn more?  Ask your healthcare provider. He or she can give you the vaccine package insert or suggest other sources of information.  Call your local or state health department.  Contact the Centers for Disease Control and Prevention (CDC):  Call 1-800-232-4636 (1-800-CDC-INFO) or  Visit CDC's website at www.cdc.gov/flu Vaccine Information Statement Inactivated Influenza Vaccine (02/24/2014)   This information is not intended to replace advice given to you by your health care provider. Make sure you discuss any questions you have with   your health care provider.   Document Released: 05/01/2006 Document Revised: 07/28/2014 Document Reviewed: 02/27/2014 Elsevier Interactive Patient Education 2016 [employer] Pneumococcal Conjugate Vaccine (PCV13)  1. Why get vaccinated? Vaccination can protect both children and adults from pneumococcal disease. Pneumococcal disease is caused by bacteria that can spread from person to person through close contact. It can cause ear infections, and it can also lead to more serious infections of the:  Lungs (pneumonia),  Blood (bacteremia), and  Covering of the brain and spinal cord (meningitis). Pneumococcal pneumonia is most common among adults. Pneumococcal meningitis can cause deafness and brain damage, and it kills about 1 child in 10 who get it. Anyone can get  pneumococcal disease, but children under 53 years of age and adults 15 years and older, people with certain medical conditions, and cigarette smokers are at the highest risk. Before there was a vaccine, the [place] saw:  more than 700 cases of meningitis,  about 13,000 blood infections,  about 5 million ear infections, and  about 200 deaths in children under 5 each year from pneumococcal disease. Since vaccine became available, severe pneumococcal disease in these children has fallen by 88%. About 18,000 older adults die of pneumococcal disease each year in the [place]. Treatment of pneumococcal infections with penicillin and other drugs is not as effective as it used to be, because some strains of the disease have become resistant to these drugs. This makes prevention of the disease, through vaccination, even more important. 2. PCV13 vaccine Pneumococcal conjugate vaccine (called PCV13) protects against 13 types of pneumococcal bacteria. PCV13 is routinely given to children at 2, 4, 6, and 62-50 months of age. It is also recommended for children and adults 67 to 80 years of age with certain health conditions, and for all adults 61 years of age and older. Your doctor can give you details. 3. Some people should not get this vaccine Anyone who has ever had a life-threatening allergic reaction to a dose of this vaccine, to an earlier pneumococcal vaccine called PCV7, or to any vaccine containing diphtheria toxoid (for example, DTaP), should not get PCV13. Anyone with a severe allergy to any component of PCV13 should not get the vaccine. Tell your doctor if the person being vaccinated has any severe allergies. If the person scheduled for vaccination is not feeling well, your healthcare provider might decide to reschedule the shot on another day. 4. Risks of a vaccine reaction With any medicine, including vaccines, there is a chance of reactions. These are usually mild and go away on their  own, but serious reactions are also possible. Problems reported following PCV13 varied by age and dose in the series. The most common problems reported among children were:  About half became drowsy after the shot, had a temporary loss of appetite, or had redness or tenderness where the shot was given.  About 1 out of 3 had swelling where the shot was given.  About 1 out of 3 had a mild fever, and about 1 in 20 had a fever over 102.59F.  Up to about 8 out of 10 became fussy or irritable. Adults have reported pain, redness, and swelling where the shot was given; also mild fever, fatigue, headache, chills, or muscle pain. Young children who get PCV13 along with inactivated flu vaccine at the same time may be at increased risk for seizures caused by fever. Ask your doctor for more information. Problems that could happen after any vaccine:  People sometimes faint after  a medical procedure, including vaccination. Sitting or lying down for about 15 minutes can help prevent fainting, and injuries caused by a fall. Tell your doctor if you feel dizzy, or have vision changes or ringing in the ears.  Some older children and adults get severe pain in the shoulder and have difficulty moving the arm where a shot was given. This happens very rarely.  Any medication can cause a severe allergic reaction. Such reactions from a vaccine are very rare, estimated at about 1 in a million doses, and would happen within a few minutes to a few hours after the vaccination. As with any medicine, there is a very small chance of a vaccine causing a serious injury or death. The safety of vaccines is always being monitored. For more information, visit: http://www.aguilar.org/ 5. What if there is a serious reaction? What should I look for?  Look for anything that concerns you, such as signs of a severe allergic reaction, very high fever, or unusual behavior. Signs of a severe allergic reaction can include hives, swelling  of the face and throat, difficulty breathing, a fast heartbeat, dizziness, and weakness-usually within a few minutes to a few hours after the vaccination. What should I do?  If you think it is a severe allergic reaction or other emergency that can't wait, call 9-1-1 or get the person to the nearest hospital. Otherwise, call your doctor. Reactions should be reported to the Vaccine Adverse Event Reporting System (VAERS). Your doctor should file this report, or you can do it yourself through the VAERS web site at www.vaers.SamedayNews.es, or by calling (617) 500-2729. VAERS does not give medical advice. 6. The National Vaccine Injury Compensation Program The [employer] Vaccine Injury Compensation Program (VICP) is a federal program that was created to compensate people who may have been injured by certain vaccines. Persons who believe they may have been injured by a vaccine can learn about the program and about filing a claim by calling 616-799-4199 or visiting the [place] website at GoldCloset.com.ee. There is a time limit to file a claim for compensation. 7. How can I learn more?  Ask your healthcare provider. He or she can give you the vaccine package insert or suggest other sources of information.  Call your local or state health department.  Contact the Centers for Disease Control and Prevention (CDC):  Call (435)005-6480 (1-800-CDC-INFO) or  Visit CDC's website at http://hunter.com/ Vaccine Information Statement PCV13 Vaccine (05/25/2014)   This information is not intended to replace advice given to you by your health care provider. Make sure you discuss any questions you have with your health care provider.   Document Released: 05/04/2006 Document Revised: 07/28/2014 Document Reviewed: 06/01/2014 Elsevier Interactive Patient Education [employer].

## 2016-04-05 LAB — COMPREHENSIVE METABOLIC PANEL
A/G RATIO: 1 — AB (ref 1.2–2.2)
ALT: 6 IU/L (ref 0–44)
AST: 9 IU/L (ref 0–40)
Albumin: 3.5 g/dL — ABNORMAL LOW (ref 3.6–4.8)
Alkaline Phosphatase: 69 IU/L (ref 39–117)
BUN/Creatinine Ratio: 12 (ref 10–24)
BUN: 19 mg/dL (ref 8–27)
Bilirubin Total: 0.3 mg/dL (ref 0.0–1.2)
CALCIUM: 9.9 mg/dL (ref 8.6–10.2)
CO2: 22 mmol/L (ref 18–29)
Chloride: 99 mmol/L (ref 96–106)
Creatinine, Ser: 1.56 mg/dL — ABNORMAL HIGH (ref 0.76–1.27)
GFR calc Af Amer: 53 mL/min/{1.73_m2} — ABNORMAL LOW (ref >59)
GFR, EST NON AFRICAN AMERICAN: 46 mL/min/{1.73_m2} — AB (ref >59)
Globulin, Total: 3.5 g/dL (ref 1.5–4.5)
Glucose: 137 mg/dL — ABNORMAL HIGH (ref 65–99)
POTASSIUM: 4.5 mmol/L (ref 3.5–5.2)
Sodium: 139 mmol/L (ref 134–144)
Total Protein: 7 g/dL (ref 6.0–8.5)

## 2016-04-07 ENCOUNTER — Other Ambulatory Visit: Payer: Self-pay | Admitting: Family Medicine

## 2016-04-07 ENCOUNTER — Telehealth: Payer: Self-pay | Admitting: Unknown Physician Specialty

## 2016-04-07 DIAGNOSIS — R918 Other nonspecific abnormal finding of lung field: Secondary | ICD-10-CM

## 2016-04-07 MED ORDER — LORAZEPAM 0.5 MG PO TABS: 0.5000 mg | ORAL_TABLET | Freq: Every day | ORAL | 1 refills | Status: DC

## 2016-04-07 NOTE — Telephone Encounter (Signed)
Discussed with pt about chest x-ray.  CT is pending.  He would like something for nerves.  Lorazepam will be called in

## 2016-04-07 NOTE — Telephone Encounter (Signed)
Medication called in 

## 2016-04-07 NOTE — Telephone Encounter (Signed)
We discussed chest x-ray showing mass.  Order CT with contrast. Communicated with the cancer center.

## 2016-04-09 ENCOUNTER — Other Ambulatory Visit: Payer: Self-pay | Admitting: Family Medicine

## 2016-04-09 NOTE — Telephone Encounter (Signed)
Routing to provider  

## 2016-04-10 ENCOUNTER — Ambulatory Visit
Admission: RE | Admit: 2016-04-10 | Discharge: 2016-04-10 | Disposition: A | Discharge status: Home / Self Care | Payer: BLUE CROSS/BLUE SHIELD | Source: Ambulatory Visit | Attending: Unknown Physician Specialty | Admitting: Unknown Physician Specialty

## 2016-04-10 ENCOUNTER — Encounter: Payer: Self-pay | Admitting: Oncology

## 2016-04-10 ENCOUNTER — Inpatient Hospital Stay: Payer: BLUE CROSS/BLUE SHIELD | Attending: Oncology | Admitting: Oncology

## 2016-04-10 ENCOUNTER — Telehealth: Payer: Self-pay

## 2016-04-10 VITALS — BP 141/81 | HR 80 | Temp 96.9°F | Resp 18 | Wt 161.4 lb

## 2016-04-10 DIAGNOSIS — I2510 Atherosclerotic heart disease of native coronary artery without angina pectoris: Secondary | ICD-10-CM | POA: Insufficient documentation

## 2016-04-10 DIAGNOSIS — J9819 Other pulmonary collapse: Secondary | ICD-10-CM | POA: Insufficient documentation

## 2016-04-10 DIAGNOSIS — I10 Essential (primary) hypertension: Secondary | ICD-10-CM | POA: Diagnosis not present

## 2016-04-10 DIAGNOSIS — J449 Chronic obstructive pulmonary disease, unspecified: Secondary | ICD-10-CM | POA: Insufficient documentation

## 2016-04-10 DIAGNOSIS — R918 Other nonspecific abnormal finding of lung field: Secondary | ICD-10-CM | POA: Insufficient documentation

## 2016-04-10 DIAGNOSIS — E119 Type 2 diabetes mellitus without complications: Secondary | ICD-10-CM | POA: Insufficient documentation

## 2016-04-10 DIAGNOSIS — F419 Anxiety disorder, unspecified: Secondary | ICD-10-CM | POA: Insufficient documentation

## 2016-04-10 DIAGNOSIS — R05 Cough: Secondary | ICD-10-CM | POA: Diagnosis not present

## 2016-04-10 DIAGNOSIS — Z87891 Personal history of nicotine dependence: Secondary | ICD-10-CM | POA: Insufficient documentation

## 2016-04-10 DIAGNOSIS — E785 Hyperlipidemia, unspecified: Secondary | ICD-10-CM | POA: Insufficient documentation

## 2016-04-10 DIAGNOSIS — J90 Pleural effusion, not elsewhere classified: Secondary | ICD-10-CM | POA: Diagnosis not present

## 2016-04-10 DIAGNOSIS — Z79899 Other long term (current) drug therapy: Secondary | ICD-10-CM | POA: Insufficient documentation

## 2016-04-10 DIAGNOSIS — N189 Chronic kidney disease, unspecified: Secondary | ICD-10-CM | POA: Diagnosis not present

## 2016-04-10 DIAGNOSIS — I700 Atherosclerosis of aorta: Secondary | ICD-10-CM | POA: Insufficient documentation

## 2016-04-10 MED ORDER — IOPAMIDOL (ISOVUE-300) INJECTION 61%
60.0000 mL | Freq: Once | INTRAVENOUS | Status: AC | PRN
  Administered 2016-04-10: 60 mL via INTRAVENOUS

## 2016-04-10 NOTE — Progress Notes (Signed)
New evaluation for lung cancer. States has dry cough but feeling well.

## 2016-04-10 NOTE — Telephone Encounter (Signed)
[facility] called to let us know his Chest CT results were back and in his chart.

## 2016-04-14 NOTE — Progress Notes (Signed)
[place]  Telephone:([phone]) [phone] [phone]) [phone]  ID: Ralph Lopez OB: May 25, 1951  MR#: [id number]  PZW#:[id number]  Patient Care Team: Kathrine Haddock, NP as PCP - General (Nurse Practitioner)  CHIEF COMPLAINT: Right upper lobe lung mass.  INTERVAL HISTORY: Patient is 65 year old male who presented to his primary care physician with persistent cough. He otherwise felt well. Subsequent x-ray and CT scan revealed a large right upper lobe lung mass highly suspicious for malignancy. Currently, patient feels well and is asymptomatic. He denies any fevers. He has no neurologic complaints. He has a good appetite and denies weight loss. He continues to have a cough, but denies shortness of breath, chest pain, or hemoptysis. He has no nausea, vomiting, constipation, or diarrhea. He has no urinary complaints. Patient otherwise feels well and offers no further specific complaints.  REVIEW OF SYSTEMS:   Review of Systems  Constitutional: Negative.  Negative for fever, malaise/fatigue and weight loss.  Respiratory: Positive for cough. Negative for hemoptysis and shortness of breath.   Cardiovascular: Negative.  Negative for chest pain and leg swelling.  Gastrointestinal: Negative.  Negative for abdominal pain.  Musculoskeletal: Negative.   Neurological: Negative.  Negative for sensory change and weakness.  Psychiatric/Behavioral: Negative.  The patient is not nervous/anxious.     As per HPI. Otherwise, a complete review of systems is negative.  PAST MEDICAL HISTORY: Past Medical History:  Diagnosis Date  . Anxiety   . Chronic kidney disease   . COPD (chronic obstructive pulmonary disease) ([place])   . Depression   . Diabetes mellitus without complication ([place])   . Hyperlipidemia   . Hypertension     PAST SURGICAL HISTORY: Past Surgical History:  Procedure Laterality Date  . EYE SURGERY    . MANDIBLE SURGERY      FAMILY HISTORY: Family History  Problem  Relation Age of Onset  . Diabetes Mother   . Hypertension Mother   . Breast cancer Mother   . Thrombosis Mother   . Heart disease Father     MI  . Breast cancer Sister   . Colon cancer Brother   . Lung cancer Brother     ADVANCED DIRECTIVES (Y/N):  N  HEALTH MAINTENANCE: Social History  Substance Use Topics  . Smoking status: Former Smoker    Years: 20.00    Quit date: 04/22/2014  . Smokeless tobacco: Never Used  . Alcohol use No     Colonoscopy:  PAP:  Bone density:  Lipid panel:  No Known Allergies  Current Outpatient Prescriptions  Medication Sig Dispense Refill  . albuterol (PROVENTIL HFA;VENTOLIN HFA) 108 (90 BASE) MCG/ACT inhaler Inhale 2 puffs into the lungs every 6 (six) hours as needed for wheezing or shortness of breath.    Marland Kitchen aspirin EC 81 MG tablet Take 81 mg by mouth daily.    Marland Kitchen atenolol (TENORMIN) 100 MG tablet TAKE ONE TABLET BY MOUTH ONCE DAILY 90 tablet 0  . atorvastatin (LIPITOR) 10 MG tablet TAKE ONE TABLET BY MOUTH ONCE DAILY 30 tablet 6  . BYDUREON 2 MG PEN INJECT '2MG'$  SUBCUTANEOUSLY WEEKLY 4 each 0  . lisinopril (PRINIVIL,ZESTRIL) 40 MG tablet Take 1 tablet (40 mg total) by mouth daily. 90 tablet 1  . pantoprazole (PROTONIX) 40 MG tablet TAKE ONE TABLET BY MOUTH ONCE DAILY 30 tablet 0  . ranitidine (ZANTAC) 150 MG tablet Take 150 mg by mouth daily.    Nelva Nay SOLOSTAR 300 UNIT/ML SOPN INJECT DAILY AS DIRECTED 4 pen 6  .  traMADol (ULTRAM) 50 MG tablet Take 1 tablet (50 mg total) by mouth every 8 (eight) hours as needed. 30 tablet 0  . umeclidinium-vilanterol (ANORO ELLIPTA) 62.5-25 MCG/INH AEPB Inhale 1 puff into the lungs daily. 1 each 12   Current Facility-Administered Medications  Medication Dose Route Frequency Provider Last Rate Last Dose  . umeclidinium-vilanterol (ANORO ELLIPTA) 62.5-25 MCG/INH 1 puff  1 puff Inhalation Daily Kathrine Haddock, NP        OBJECTIVE: Vitals:   04/10/16 1453  BP: (!) 141/81  Pulse: 80  Resp: 18  Temp: (!)  96.9 F (36.1 C)     Body mass index is 22.97 kg/m.    ECOG FS:0 - Asymptomatic  General: Well-developed, well-nourished, no acute distress. Eyes: Pink conjunctiva, anicteric sclera. HEENT: Normocephalic, moist mucous membranes, clear oropharnyx. Lungs: Clear to auscultation bilaterally. Heart: Regular rate and rhythm. No rubs, murmurs, or gallops. Abdomen: Soft, nontender, nondistended. No organomegaly noted, normoactive bowel sounds. Musculoskeletal: No edema, cyanosis, or clubbing. Neuro: Alert, answering all questions appropriately. Cranial nerves grossly intact. Skin: No rashes or petechiae noted. Psych: Normal affect. Lymphatics: No cervical, calvicular, axillary or inguinal LAD.   LAB RESULTS:  Lab Results  Component Value Date   NA 139 04/04/2016   K 4.5 04/04/2016   CL 99 04/04/2016   CO2 22 04/04/2016   GLUCOSE 137 (H) 04/04/2016   BUN 19 04/04/2016   CREATININE 1.56 (H) 04/04/2016   CALCIUM 9.9 04/04/2016   PROT 7.0 04/04/2016   ALBUMIN 3.5 (L) 04/04/2016   AST 9 04/04/2016   ALT 6 04/04/2016   ALKPHOS 69 04/04/2016   BILITOT 0.3 04/04/2016   GFRNONAA 46 (L) 04/04/2016   GFRAA 53 (L) 04/04/2016    Lab Results  Component Value Date   WBC 14.5 05/01/2014   NEUTROABS 11 05/01/2014   HGB 14.3 05/01/2014   HCT 43 05/01/2014   MCV 96 04/25/2014   PLT 277 05/01/2014     STUDIES: Dg Chest 2 View  Result Date: 04/04/2016 CLINICAL DATA:  Cough for 2 weeks. EXAM: CHEST  2 VIEW COMPARISON:  Radiographs of April 24, 2014. FINDINGS: Large right hilar and upper lobe opacity is noted most consistent with large right hilar mass or neoplasm resulting in right upper lobe atelectasis. No pneumothorax is noted. No pleural effusion is noted. Left lung is clear. Cardiac silhouette appears normal. Bony thorax is unremarkable. IMPRESSION: Large right hilar mass consistent with malignancy is noted with probable atelectasis of right upper lobe. CT scan of the chest with  contrast administration is recommended for further evaluation. These results will be called to the ordering clinician or representative by the Radiologist Assistant, and communication documented in the PACS or zVision Dashboard. Electronically Signed   By: Marijo Conception, M.D.   On: 04/04/2016 14:54   Ct Chest W Contrast  Result Date: 04/10/2016 CLINICAL DATA:  Right lung mass appreciable on recent chest radiograph EXAM: CT CHEST WITH CONTRAST TECHNIQUE: Multidetector CT imaging of the chest was performed during intravenous contrast administration. CONTRAST:  46m ISOVUE-300 IOPAMIDOL (ISOVUE-300) INJECTION 61% COMPARISON:  Chest radiograph April 04, 2016 FINDINGS: Cardiovascular: There is no appreciable thoracic aortic aneurysm or dissection. There is atherosclerotic calcification in the aorta as well as foci of coronary artery calcification at multiple sites. The visualized great vessels show mild calcification at the origins of the left common and left subclavian arteries. There is a minimal amount of pericardial fluid present. There is a mass which is causing collapse of the  right upper lobe with extension into the pulmonary venous confluence from the right side. There is no major vessel pulmonary arterial embolus seen. Mediastinum/Nodes: Mass which appears to arise from the right upper lobe extends into the right superior mediastinum at the level of the superior vena cava. This mass does not invade the superior vena cava. There are multiple small lymph nodes in the right paratracheal region. The largest of these lymph nodes measures 1.3 x 1.2 cm. There is an enlarged sub- carinal lymph node measuring 1.7 x 1.2 cm. There is right hilar adenopathy immediately adjacent to the right upper lobe mass measuring 2.0 x 1.8 cm. Thyroid appears unremarkable.  There is a small hiatal hernia. Lungs/Pleura: There is a mass with consolidation collapsing the right upper lobe. It is difficult to separate mass from  surrounding consolidation. The area that is felt to represent primarily mass measures 8.7 x 6.2 cm. Mass extends into the right upper lobe pulmonary vein and into the central pulmonary vein confluence near the junction with the left atrium. Tumor extending into the pulmonary venous region new measures 4.3 x 2.1 by 2.5 cm. There is a fairly small pleural effusion on the right. On axial slice 89 series 3, there is a 6 mm nodular opacity in the lateral segment of the right lower lobe. On axial slice 784 series 3, there is a 4 mm nodular opacity in the lateral segment of the left lower lobe. On axial slice 84 series 3, there is a 5 mm nodular opacity abutting the major fissure on the left. Upper Abdomen: There is incomplete visualization of a left renal cyst measuring 1.9 x 1.8 cm arising medially from the upper pole region. Adrenals appear normal bilaterally. Visualized upper abdominal structures otherwise appear unremarkable. Musculoskeletal: There is degenerative change in the thoracic spine noted. There are no blastic or lytic bone lesions. IMPRESSION: Large mass arising from the right upper lobe with surrounding right upper lobe consolidation and right upper lobe collapse. There is apparent invasion of tumor into the right upper lobe pulmonary vein extending into the confluence of the pulmonary veins near the insertion with the left atrium. There is adenopathy in the right paratracheal and right hilar regions as well as in the sub- carinal region. Scattered small nodular opacities bilaterally may well represent small parenchymal metastases. No adrenal lesions evident. There is a fairly small right pleural effusion. There is coronary artery calcification as well as atherosclerotic calcification in the aorta/aortic atherosclerosis. These results will be called to the ordering clinician or representative by the Radiologist Assistant, and communication documented in the PACS or zVision Dashboard. Electronically Signed    By: Lowella Grip III M.D.   On: 04/10/2016 09:29    ASSESSMENT: Right upper lobe lung mass.  PLAN:    1. Right upper lobe lung mass: CT scan results reviewed independently and reported as above. This highly suspicious for underlying malignancy. 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 We will get a PET scan as well as a CT-guided biopsy in the next 1-2 weeks to confirm the diagnosis. Patient will then return to clinic today discuss the results and treatment planning. He will also likely need an MRI of the brain in the near future. 2. Renal insufficiency: Unclear patient's baseline, monitor.  Approximately 45 minutes was spent in discussion of which greater than 50% was consultation.  Patient expressed understanding and was in agreement with this plan. He also understands that He can call clinic  at any time with any questions, concerns, or complaints.   No matching staging information was found for the patient.  Lloyd Huger, MD   04/14/2016 10:11 AM

## 2016-04-18 ENCOUNTER — Telehealth: Payer: Self-pay | Admitting: *Deleted

## 2016-04-18 ENCOUNTER — Telehealth: Payer: Self-pay | Admitting: Oncology

## 2016-04-18 NOTE — Telephone Encounter (Signed)
Lattie Haw has been working on this appt all week for the patient. Please discuss with her. Thanks.

## 2016-04-18 NOTE — Telephone Encounter (Signed)
Asking if the biopsy has been decided not to be done. Sees where the appt was moved for PET and FU. I see where Mickel Baas has an inquiry regarding this matter

## 2016-04-18 NOTE — Telephone Encounter (Signed)
Pt sister called [facility] today, very anxious that they had not received a date yet for CT guided bx. I called and lvm for specialty schedulers to inquire on status and advised Dianne that I would call her back when I find out. Can you confirm that team faxed over orders already? Thanks.

## 2016-04-18 NOTE — Telephone Encounter (Signed)
All orders have been submitted for this patient. Lattie Haw has been working on getting this scheduled as well. Please discuss with her. Thanks.

## 2016-04-21 ENCOUNTER — Encounter: Payer: Self-pay | Admitting: *Deleted

## 2016-04-21 ENCOUNTER — Telehealth: Payer: Self-pay | Admitting: *Deleted

## 2016-04-21 ENCOUNTER — Encounter: Payer: Self-pay | Admitting: Oncology

## 2016-04-21 NOTE — Telephone Encounter (Signed)
Called pt to inform him that we are still working on getting biopsy scheduled. Spoke with pt's wife and reassured her that biopsy will get scheduled so we can move forward with treatment. Pt's sister called this morning as well to question about biopsy but was unable to call her back since patient's sister is not an authorized person for our office to discuss his medical care with. This information was passed along to administration to discuss with pt's sister if calls back.

## 2016-04-22 ENCOUNTER — Other Ambulatory Visit: Payer: Self-pay | Admitting: *Deleted

## 2016-04-22 ENCOUNTER — Other Ambulatory Visit: Payer: Self-pay | Admitting: Oncology

## 2016-04-22 ENCOUNTER — Telehealth: Payer: Self-pay | Admitting: *Deleted

## 2016-04-22 MED ORDER — OXYCODONE-ACETAMINOPHEN 5-325 MG PO TABS: 1.0000 | ORAL_TABLET | Freq: Four times a day (QID) | ORAL | 0 refills | Status: DC | PRN

## 2016-04-22 NOTE — Telephone Encounter (Signed)
-----   Message from Lloyd Huger, MD sent at 04/22/2016 12:16 PM EDT ----- Dr. Rosita Fire will do bronch on 10/9 tentatively. They will call patient to confirm.  He needs to stop asa today if he hasn't already.  Cancel PET and CT biopsy.  ----- Message ----- From: Cephus Richer Sent: 04/18/2016  11:58 AM To: Lloyd Huger, MD, Johney Maine, RN, #  Dr. Grayland Ormond have you talk to the radiologist about pt?

## 2016-04-22 NOTE — Telephone Encounter (Signed)
Spoke with patients wife regarding update to plan of care. Dr. Grayland Ormond spoke with Dr. Alva Garnet and patient will be set up for bronchoscopy tentatively on 10/9. Patient also has PET scan scheduled for 10/10, will proceed with PET scan per Dr. Grayland Ormond. Patients wife verbalized understanding of plan. Dr. Alva Garnet office will call with details of bronchoscopy. Patient has been off aspirin, family advised to continue to hold aspirin at this time. Patients wife also requesting pain medication as patient has only been taking Tylenol, prescription for Norco will be ready for pick up today.

## 2016-04-24 ENCOUNTER — Ambulatory Visit (INDEPENDENT_AMBULATORY_CARE_PROVIDER_SITE_OTHER): Payer: BLUE CROSS/BLUE SHIELD | Admitting: Pulmonary Disease

## 2016-04-24 ENCOUNTER — Encounter: Payer: Self-pay | Admitting: Oncology

## 2016-04-24 ENCOUNTER — Encounter: Payer: Self-pay | Admitting: Pulmonary Disease

## 2016-04-24 VITALS — BP 140/70 | HR 80 | Ht 66.0 in | Wt 159.0 lb

## 2016-04-24 DIAGNOSIS — R918 Other nonspecific abnormal finding of lung field: Secondary | ICD-10-CM

## 2016-04-24 DIAGNOSIS — Z87891 Personal history of nicotine dependence: Secondary | ICD-10-CM | POA: Diagnosis not present

## 2016-04-24 DIAGNOSIS — J9819 Other pulmonary collapse: Secondary | ICD-10-CM | POA: Diagnosis not present

## 2016-04-24 NOTE — Patient Instructions (Signed)
Bronchoscopy Monday 04/28/16 @ 1:00 PM

## 2016-04-27 NOTE — Progress Notes (Signed)
PULMONARY CONSULT NOTE  Requesting MD/Service: Grayland Ormond Date of initial consultation: 04/24/16 Reason for consultation: Lung mass and RUL collapse  PT PROFILE: 25 M former smoker referred for eval of RUL collapse and apparent lung mass  HPI:  46 M former smoker who develop a "small cough" of a few days duration and was seen by primary provider who ordered CXR which is discussed below. He was referred to Oncology, then here for diagnostic intervention. He denies significant DOE, hemoptysis, weight loss and chest pain.  Past Medical History:  Diagnosis Date  . Anxiety   . Chronic kidney disease   . COPD (chronic obstructive pulmonary disease) ([place])   . Depression   . Diabetes mellitus without complication ([place])   . Hyperlipidemia   . Hypertension     Past Surgical History:  Procedure Laterality Date  . EYE SURGERY    . MANDIBLE SURGERY      MEDICATIONS: I have reviewed all medications and confirmed regimen as documented  Social History   Social History  . Marital status: Married    Spouse name: N/A  . Number of children: N/A  . Years of education: N/A   Occupational History  . Not on file.   Social History Main Topics  . Smoking status: Former Smoker    Years: 20.00    Quit date: 04/22/2014  . Smokeless tobacco: Never Used  . Alcohol use No  . Drug use: No  . Sexual activity: No   Other Topics Concern  . Not on file   Social History Narrative  . No narrative on file    Family History  Problem Relation Age of Onset  . Diabetes Mother   . Hypertension Mother   . Breast cancer Mother   . Thrombosis Mother   . Heart disease Father     MI  . Breast cancer Sister   . Colon cancer Brother   . Lung cancer Brother     ROS: No fever, myalgias/arthralgias, unexplained weight loss or weight gain No new focal weakness or sensory deficits No otalgia, hearing loss, visual changes, nasal and sinus symptoms, mouth and throat problems No neck pain or  adenopathy No abdominal pain, N/V/D, diarrhea, change in bowel pattern No dysuria, change in urinary pattern   Vitals:   04/24/16 1128  BP: 140/70  Pulse: 80  SpO2: 97%  Weight: 159 lb (72.1 kg)  Height: '5\' 6"'$  (1.676 m)     EXAM:  Gen: WDWN, No overt respiratory distress HEENT: NCAT, sclera white, oropharynx normal Neck: Supple without LAN, thyromegaly, JVD Lungs: breath sounds: diffusely diminished, No wheezes Cardiovascular: Reg, no murmurs noted Abdomen: Soft, nontender, normal BS Ext: without clubbing, cyanosis, edema Neuro: CNs grossly intact, motor and sensory intact Skin: Limited exam, no lesions noted  DATA:   BMP Latest Ref Rng & Units 04/04/2016 01/02/2016 07/03/2015  Glucose 65 - 99 mg/dL 137(H) 218(H) 106(H)  BUN 8 - 27 mg/dL '19 21 22  '$ Creatinine 0.76 - 1.27 mg/dL 1.56(H) 1.72(H) 1.51(H)  BUN/Creat Ratio 10 - '24 12 12 15  '$ Sodium 134 - 144 mmol/L 139 142 141  Potassium 3.5 - 5.2 mmol/L 4.5 5.4(H) 5.1  Chloride 96 - 106 mmol/L 99 102 105  CO2 18 - 29 mmol/L '22 21 23  '$ Calcium 8.6 - 10.2 mg/dL 9.9 9.7 9.5    CBC Latest Ref Rng & Units 05/01/2014 04/25/2014 04/24/2014  WBC 10:3/mL 14.5 17.7(H) 20.0(H)  Hemoglobin 13.5 - 17.5 g/dL 14.3 12.8(L) 14.7  Hematocrit 41 -  53 % 43 40.4 45.0  Platelets 150 - 399 K/L 277 255 311    CXR (04/04/16) Large right hilar mass consistent with malignancy is noted with probable atelectasis of right upper lobe    CT chest (04/10/16): IMPRESSION: Large mass arising from the right upper lobe with surrounding right upper lobe consolidation and right upper lobe collapse. There is apparent invasion of tumor into the right upper lobe pulmonary vein extending into the confluence of the pulmonary veins near the insertion with the left atrium. There is adenopathy in the right paratracheal and right hilar regions as well as in the sub- carinal region. Scattered small nodular opacities bilaterally may well represent small parenchymal  metastases. No adrenal lesions evident. There is a fairly small right pleural effusion  IMPRESSION:     ICD-9-CM ICD-10-CM   1. Lung mass 786.6 R91.8   2. Former smoker V15.82 Z87.891   3. Lung collapse 518.0 J98.19    I discussed the radiographic findings with the patient and his wife. They understand that this is almost certainly a malignancy and the need for confirmatory tissue diagnosis. The best way to pursue a biopsy is bronchoscopically. There is likely an endobronchial component and therefore, the yield of bronchoscopy should be very high. The procedure was discussed in detail including indication, technique, risks and alternatives. He agrees to proceed   PLAN:  Bronchoscopy scheduled for 10/09 @ 1:00 PM. The exact tissue sampling technique will be determined by findings on airway exam but will likely involve endobronchial biopsy of RUL bronchus. If there is no endobronchial component of tumor, needle biopsy, brushings and TBBx will be considered   Merton Border, MD PCCM service Mobile [phone] Pager [phone] 04/27/2016

## 2016-04-28 ENCOUNTER — Ambulatory Visit
Admission: RE | Admit: 2016-04-28 | Discharge: 2016-04-28 | Disposition: A | Discharge status: Home / Self Care | Payer: BLUE CROSS/BLUE SHIELD | Source: Ambulatory Visit | Attending: Pulmonary Disease | Admitting: Pulmonary Disease

## 2016-04-28 ENCOUNTER — Encounter: Payer: Self-pay | Admitting: *Deleted

## 2016-04-28 ENCOUNTER — Ambulatory Visit: Payer: BLUE CROSS/BLUE SHIELD | Admitting: Oncology

## 2016-04-28 ENCOUNTER — Ambulatory Visit: Payer: BLUE CROSS/BLUE SHIELD

## 2016-04-28 ENCOUNTER — Telehealth: Payer: Self-pay | Admitting: *Deleted

## 2016-04-28 ENCOUNTER — Encounter
Admission: RE | Disposition: A | Discharge status: Home / Self Care | Payer: Self-pay | Source: Ambulatory Visit | Attending: Pulmonary Disease

## 2016-04-28 ENCOUNTER — Other Ambulatory Visit: Payer: Self-pay | Admitting: *Deleted

## 2016-04-28 DIAGNOSIS — R918 Other nonspecific abnormal finding of lung field: Secondary | ICD-10-CM

## 2016-04-28 DIAGNOSIS — N189 Chronic kidney disease, unspecified: Secondary | ICD-10-CM | POA: Diagnosis not present

## 2016-04-28 DIAGNOSIS — I129 Hypertensive chronic kidney disease with stage 1 through stage 4 chronic kidney disease, or unspecified chronic kidney disease: Secondary | ICD-10-CM | POA: Insufficient documentation

## 2016-04-28 DIAGNOSIS — Z87891 Personal history of nicotine dependence: Secondary | ICD-10-CM | POA: Insufficient documentation

## 2016-04-28 DIAGNOSIS — C3411 Malignant neoplasm of upper lobe, right bronchus or lung: Secondary | ICD-10-CM | POA: Diagnosis not present

## 2016-04-28 DIAGNOSIS — E1122 Type 2 diabetes mellitus with diabetic chronic kidney disease: Secondary | ICD-10-CM | POA: Diagnosis not present

## 2016-04-28 DIAGNOSIS — J449 Chronic obstructive pulmonary disease, unspecified: Secondary | ICD-10-CM | POA: Diagnosis not present

## 2016-04-28 DIAGNOSIS — J9819 Other pulmonary collapse: Secondary | ICD-10-CM | POA: Insufficient documentation

## 2016-04-28 HISTORY — PX: FLEXIBLE BRONCHOSCOPY: SHX5094

## 2016-04-28 SURGERY — BRONCHOSCOPY, FLEXIBLE
Anesthesia: Moderate Sedation

## 2016-04-28 MED ORDER — MIDAZOLAM HCL 5 MG/5ML IJ SOLN
INTRAMUSCULAR | Status: AC
  Administered 2016-04-28: 2 mg
  Filled 2016-04-28: qty 10

## 2016-04-28 MED ORDER — FENTANYL CITRATE (PF) 100 MCG/2ML IJ SOLN
INTRAMUSCULAR | Status: AC
  Administered 2016-04-28: 50 ug
  Filled 2016-04-28: qty 4

## 2016-04-28 MED ORDER — SODIUM CHLORIDE 0.9 % IV SOLN
INTRAVENOUS | Status: DC
  Administered 2016-04-28: 13:00:00 via INTRAVENOUS

## 2016-04-28 MED ORDER — IPRATROPIUM-ALBUTEROL 0.5-2.5 (3) MG/3ML IN SOLN
3.0000 mL | Freq: Once | RESPIRATORY_TRACT | Status: AC
  Administered 2016-04-28: 3 mL via RESPIRATORY_TRACT

## 2016-04-28 MED ORDER — PHENYLEPHRINE HCL 0.25 % NA SOLN: 1.0000 | Freq: Four times a day (QID) | NASAL | Status: DC | PRN

## 2016-04-28 MED ORDER — HYDROCOD POLST-CPM POLST ER 10-8 MG/5ML PO SUER: 5.0000 mL | Freq: Two times a day (BID) | ORAL | 0 refills | Status: DC | PRN

## 2016-04-28 MED ORDER — IPRATROPIUM-ALBUTEROL 0.5-2.5 (3) MG/3ML IN SOLN
RESPIRATORY_TRACT | Status: AC
  Administered 2016-04-28: 3 mL via RESPIRATORY_TRACT
  Filled 2016-04-28: qty 3

## 2016-04-28 MED ORDER — LIDOCAINE HCL 2 % EX GEL: 1.0000 "application " | Freq: Once | CUTANEOUS | Status: DC

## 2016-04-28 MED ORDER — BUTAMBEN-TETRACAINE-BENZOCAINE 2-2-14 % EX AERO: 1.0000 | INHALATION_SPRAY | Freq: Once | CUTANEOUS | Status: DC

## 2016-04-28 NOTE — Procedures (Signed)
Indication:   R lung mass  Sedation:   Fentanyl 50 mcg Midaz 4 mg  Anesthesia: Topical to nose and throat 40 cc of 1% lidocaine used during the course of procedure  Procedure: After adequate sedation and anesthesia, the bronchoscope was introduced via the L naris and advanced into the posterior pharynx. Further anesthesia was obtained with 1% lidocaine and the scope was advanced into the trachea. Complete airway anesthesia was achieved with 1% lidocaine and a thorough airway examination was performed. This revealed the following findings:  Findings:  Upper airway - normal anatomy, cords moved bilaterally Tracheobronchial tree - normal anatomy on L with severe diffuse chronic bronchitic changes. RUL airway obstructed with endobronchial tumor. Mucosa of bronchus intermedius and RML edematous and beefy with appearance of diffuse tumor infiltration. RML bronchus appeared obstructed with whitish tumor. RLL airways were diffusely bronchitic but patent   Specimens:   washings from RUL  Cytology brushings from RUL   Complications: There was moderate bleeding on the second pass of the cytology brush with resultant coughing and desaturation to the low 80s. The scope was held against tumor in RUL to successfully tamponade the bleeding. Because of the extent of bleeding, no endobronchial biopsies were obtained  Post procedure evaluation:  Wheezing - nebulized bronchodilators ordered CXR - ordered   Merton Border, MD PCCM service Mobile [phone] Pager [phone]  04/28/2016

## 2016-04-28 NOTE — Interval H&P Note (Signed)
History and Physical Interval Note:  04/28/2016 1:53 PM  Ralph Lopez  has presented today for surgery, with the diagnosis of Lung mass  OK [name]   [facility] Yes  [facility] No per [facility]  The various methods of treatment have been discussed with the patient and family. After consideration of risks, benefits and other options for treatment, the patient has consented to  Procedure(s): FLEXIBLE BRONCHOSCOPY (N/A) as a surgical intervention .  The patient's history has been reviewed, patient examined, no change in status, stable for surgery.  I have reviewed the patient's chart and labs.  Questions were answered to the patient's satisfaction.     [name]

## 2016-04-28 NOTE — Addendum Note (Signed)
Addended by: Alva Garnet, Jahiem B on: 04/28/2016 02:59 PM   Modules accepted: Orders

## 2016-04-28 NOTE — Telephone Encounter (Signed)
-----   Message from [name], MD sent at 04/28/2016  3:00 PM EDT ----- Please call in Tussionex 473 ml. 5 cc po q 12 hrs PRN. No RF  I am unable to get that to print out from here  Thanks  Waunita Schooner

## 2016-04-28 NOTE — H&P (View-Only) (Signed)
PULMONARY CONSULT NOTE  Requesting MD/Service: Grayland Ormond Date of initial consultation: 04/24/16 Reason for consultation: Lung mass and RUL collapse  PT PROFILE: 37 M former smoker referred for eval of RUL collapse and apparent lung mass  HPI:  38 M former smoker who develop a "small cough" of a few days duration and was seen by primary provider who ordered CXR which is discussed below. He was referred to Oncology, then here for diagnostic intervention. He denies significant DOE, hemoptysis, weight loss and chest pain.  Past Medical History:  Diagnosis Date  . Anxiety   . Chronic kidney disease   . COPD (chronic obstructive pulmonary disease) ([place])   . Depression   . Diabetes mellitus without complication ([place])   . Hyperlipidemia   . Hypertension     Past Surgical History:  Procedure Laterality Date  . EYE SURGERY    . MANDIBLE SURGERY      MEDICATIONS: I have reviewed all medications and confirmed regimen as documented  Social History   Social History  . Marital status: Married    Spouse name: N/A  . Number of children: N/A  . Years of education: N/A   Occupational History  . Not on file.   Social History Main Topics  . Smoking status: Former Smoker    Years: 20.00    Quit date: 04/22/2014  . Smokeless tobacco: Never Used  . Alcohol use No  . Drug use: No  . Sexual activity: No   Other Topics Concern  . Not on file   Social History Narrative  . No narrative on file    Family History  Problem Relation Age of Onset  . Diabetes Mother   . Hypertension Mother   . Breast cancer Mother   . Thrombosis Mother   . Heart disease Father     MI  . Breast cancer Sister   . Colon cancer Brother   . Lung cancer Brother     ROS: No fever, myalgias/arthralgias, unexplained weight loss or weight gain No new focal weakness or sensory deficits No otalgia, hearing loss, visual changes, nasal and sinus symptoms, mouth and throat problems No neck pain or  adenopathy No abdominal pain, N/V/D, diarrhea, change in bowel pattern No dysuria, change in urinary pattern   Vitals:   04/24/16 1128  BP: 140/70  Pulse: 80  SpO2: 97%  Weight: 159 lb (72.1 kg)  Height: '5\' 6"'$  (1.676 m)     EXAM:  Gen: WDWN, No overt respiratory distress HEENT: NCAT, sclera white, oropharynx normal Neck: Supple without LAN, thyromegaly, JVD Lungs: breath sounds: diffusely diminished, No wheezes Cardiovascular: Reg, no murmurs noted Abdomen: Soft, nontender, normal BS Ext: without clubbing, cyanosis, edema Neuro: CNs grossly intact, motor and sensory intact Skin: Limited exam, no lesions noted  DATA:   BMP Latest Ref Rng & Units 04/04/2016 01/02/2016 07/03/2015  Glucose 65 - 99 mg/dL 137(H) 218(H) 106(H)  BUN 8 - 27 mg/dL '19 21 22  '$ Creatinine 0.76 - 1.27 mg/dL 1.56(H) 1.72(H) 1.51(H)  BUN/Creat Ratio 10 - '24 12 12 15  '$ Sodium 134 - 144 mmol/L 139 142 141  Potassium 3.5 - 5.2 mmol/L 4.5 5.4(H) 5.1  Chloride 96 - 106 mmol/L 99 102 105  CO2 18 - 29 mmol/L '22 21 23  '$ Calcium 8.6 - 10.2 mg/dL 9.9 9.7 9.5    CBC Latest Ref Rng & Units 05/01/2014 04/25/2014 04/24/2014  WBC 10:3/mL 14.5 17.7(H) 20.0(H)  Hemoglobin 13.5 - 17.5 g/dL 14.3 12.8(L) 14.7  Hematocrit 41 -  53 % 43 40.4 45.0  Platelets 150 - 399 K/L 277 255 311    CXR (04/04/16) Large right hilar mass consistent with malignancy is noted with probable atelectasis of right upper lobe    CT chest (04/10/16): IMPRESSION: Large mass arising from the right upper lobe with surrounding right upper lobe consolidation and right upper lobe collapse. There is apparent invasion of tumor into the right upper lobe pulmonary vein extending into the confluence of the pulmonary veins near the insertion with the left atrium. There is adenopathy in the right paratracheal and right hilar regions as well as in the sub- carinal region. Scattered small nodular opacities bilaterally may well represent small parenchymal  metastases. No adrenal lesions evident. There is a fairly small right pleural effusion  IMPRESSION:     ICD-9-CM ICD-10-CM   1. Lung mass 786.6 R91.8   2. Former smoker V15.82 Z87.891   3. Lung collapse 518.0 J98.19    I discussed the radiographic findings with the patient and his wife. They understand that this is almost certainly a malignancy and the need for confirmatory tissue diagnosis. The best way to pursue a biopsy is bronchoscopically. There is likely an endobronchial component and therefore, the yield of bronchoscopy should be very high. The procedure was discussed in detail including indication, technique, risks and alternatives. He agrees to proceed   PLAN:  Bronchoscopy scheduled for 10/09 @ 1:00 PM. The exact tissue sampling technique will be determined by findings on airway exam but will likely involve endobronchial biopsy of RUL bronchus. If there is no endobronchial component of tumor, needle biopsy, brushings and TBBx will be considered   Merton Border, MD PCCM service Mobile [phone] Pager [phone] 04/27/2016

## 2016-04-28 NOTE — Telephone Encounter (Signed)
Unable to call in RX per DS. Will print and have DR to sign and will call pt and wife to come by and pick it up.  Informed pt and wife that the rx will have to be picked up. They state they are sending their son Ralph Lopez to pick up RX. Nothing further needed.

## 2016-04-29 ENCOUNTER — Encounter: Payer: Self-pay | Admitting: Pulmonary Disease

## 2016-04-29 ENCOUNTER — Telehealth: Payer: Self-pay | Admitting: Pulmonary Disease

## 2016-04-29 ENCOUNTER — Ambulatory Visit: Payer: BLUE CROSS/BLUE SHIELD

## 2016-04-29 ENCOUNTER — Other Ambulatory Visit: Payer: Self-pay | Admitting: Unknown Physician Specialty

## 2016-04-29 LAB — GLUCOSE, CAPILLARY: GLUCOSE-CAPILLARY: 98 mg/dL (ref 65–99)

## 2016-04-29 LAB — CYTOLOGY - NON PAP

## 2016-04-29 NOTE — Telephone Encounter (Signed)
He has had no problems after bronchoscopy I have informed patient of the diagnosis of squamous cell carcinoma  Merton Border, MD PCCM service Mobile [phone] Pager [phone] 04/29/2016

## 2016-04-30 ENCOUNTER — Ambulatory Visit: Payer: BLUE CROSS/BLUE SHIELD

## 2016-05-01 ENCOUNTER — Other Ambulatory Visit: Payer: Self-pay | Admitting: *Deleted

## 2016-05-01 DIAGNOSIS — C3490 Malignant neoplasm of unspecified part of unspecified bronchus or lung: Secondary | ICD-10-CM

## 2016-05-02 ENCOUNTER — Encounter
Admission: RE | Admit: 2016-05-02 | Discharge: 2016-05-02 | Disposition: A | Discharge status: Home / Self Care | Payer: BLUE CROSS/BLUE SHIELD | Source: Ambulatory Visit | Attending: Oncology | Admitting: Oncology

## 2016-05-02 DIAGNOSIS — R918 Other nonspecific abnormal finding of lung field: Secondary | ICD-10-CM | POA: Insufficient documentation

## 2016-05-02 LAB — GLUCOSE, CAPILLARY: GLUCOSE-CAPILLARY: 136 mg/dL — AB (ref 65–99)

## 2016-05-02 MED ORDER — FLUDEOXYGLUCOSE F - 18 (FDG) INJECTION
12.2300 | Freq: Once | INTRAVENOUS | Status: AC
  Administered 2016-05-02: 12.23 via INTRAVENOUS

## 2016-05-03 ENCOUNTER — Telehealth: Payer: Self-pay | Admitting: Internal Medicine

## 2016-05-03 NOTE — Telephone Encounter (Signed)
Patient: Regarding worsening cough; Recommend use of hydrocodone chlorpheniramine- every 6-8 hours

## 2016-05-05 ENCOUNTER — Other Ambulatory Visit: Payer: Self-pay

## 2016-05-05 ENCOUNTER — Ambulatory Visit: Payer: BLUE CROSS/BLUE SHIELD

## 2016-05-05 DIAGNOSIS — C3411 Malignant neoplasm of upper lobe, right bronchus or lung: Secondary | ICD-10-CM | POA: Insufficient documentation

## 2016-05-05 MED ORDER — PANTOPRAZOLE SODIUM 40 MG PO TBEC: 40.0000 mg | DELAYED_RELEASE_TABLET | Freq: Every day | ORAL | 3 refills | Status: AC

## 2016-05-05 NOTE — Progress Notes (Signed)
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  INTERVAL HISTORY: Patient returns to clinic today for further evaluation, discussion of his PET scan results, and treatment planning. He continues to feel well and is asymptomatic. He denies any fevers. He has no neurologic complaints. He has a good appetite and denies weight loss. He continues to have a cough, but denies shortness of breath, chest pain, or hemoptysis. He has no nausea, vomiting, constipation, or diarrhea. He has no urinary complaints. Patient otherwise feels well and offers no further specific complaints.  REVIEW OF SYSTEMS:   Review of Systems  Constitutional: Negative.  Negative for fever, malaise/fatigue and weight loss.  Respiratory: Positive for cough. Negative for hemoptysis and shortness of breath.   Cardiovascular: Negative.  Negative for chest pain and leg swelling.  Gastrointestinal: Negative.  Negative for abdominal pain.  Musculoskeletal: Negative.   Neurological: Negative.  Negative for sensory change and weakness.  Psychiatric/Behavioral: Negative.  The patient is not nervous/anxious.     As per HPI. Otherwise, a complete review of systems is negative.  PAST MEDICAL HISTORY: Past Medical History:  Diagnosis Date  . Anxiety   . Chronic kidney disease   . COPD (chronic obstructive pulmonary disease) ([place])   . Depression   . Diabetes mellitus without complication ([place])   . Hyperlipidemia   . Hypertension     PAST SURGICAL HISTORY: Past Surgical History:  Procedure Laterality Date  . EYE SURGERY    . FLEXIBLE BRONCHOSCOPY N/A 04/28/2016   Procedure: FLEXIBLE BRONCHOSCOPY;  Surgeon: Ralph Mcardle, MD;   Location: [facility] ORS;  Service: Pulmonary;  Laterality: N/A;  . MANDIBLE SURGERY      FAMILY HISTORY: Family History  Problem Relation Age of Onset  . Diabetes Mother   . Hypertension Mother   . Breast cancer Mother   . Thrombosis Mother   . Heart disease Father     MI  . Breast cancer Sister   . Colon cancer Brother   . Lung cancer Brother     ADVANCED DIRECTIVES (Y/N):  N  HEALTH MAINTENANCE: Social History  Substance Use Topics  . Smoking status: Former Smoker    Years: 20.00    Quit date: 04/22/2014  . Smokeless tobacco: Never Used  . Alcohol use No     Colonoscopy:  PAP:  Bone density:  Lipid panel:  No Known Allergies  Current Outpatient Prescriptions  Medication Sig Dispense Refill  . albuterol (PROVENTIL HFA;VENTOLIN HFA) 108 (90 BASE) MCG/ACT inhaler Inhale 2 puffs into the lungs every 6 (six) hours as needed for wheezing or shortness of breath.    Marland Kitchen aspirin EC 81 MG tablet Take 81 mg by mouth daily.    Marland Kitchen atenolol (TENORMIN) 100 MG tablet TAKE ONE TABLET BY MOUTH ONCE DAILY 90 tablet 0  . atorvastatin (LIPITOR) 10 MG tablet TAKE ONE TABLET BY MOUTH ONCE DAILY 30 tablet 6  . BYDUREON 2 MG PEN INJECT '2MG'$  SUBCUTANEOUSLY WEEKLY 4 each 0  . chlorpheniramine-HYDROcodone (TUSSIONEX PENNKINETIC ER) 10-8 MG/5ML SUER Take 5 mLs by mouth every 12 (twelve) hours as needed for cough. 473 mL 0  . lisinopril (PRINIVIL,ZESTRIL) 40 MG tablet Take 1 tablet (40 mg total) by mouth daily. 90 tablet  1  . oxyCODONE-acetaminophen (PERCOCET/ROXICET) 5-325 MG tablet Take 1-2 tablets by mouth every 6 (six) hours as needed for moderate pain or severe pain. 60 tablet 0  . pantoprazole (PROTONIX) 40 MG tablet Take 1 tablet (40 mg total) by mouth daily. 90 tablet 3  . ranitidine (ZANTAC) 150 MG tablet Take 150 mg by mouth daily.    Nelva Nay SOLOSTAR 300 UNIT/ML SOPN INJECT DAILY AS DIRECTED 4 pen 6  . traMADol (ULTRAM) 50 MG tablet Take 1 tablet (50 mg total) by mouth every 8 (eight) hours  as needed. (Patient not taking: Reported on 04/28/2016) 30 tablet 0  . umeclidinium-vilanterol (ANORO ELLIPTA) 62.5-25 MCG/INH AEPB Inhale 1 puff into the lungs daily. 1 each 12   Current Facility-Administered Medications  Medication Dose Route Frequency Provider Last Rate Last Dose  . umeclidinium-vilanterol (ANORO ELLIPTA) 62.5-25 MCG/INH 1 puff  1 puff Inhalation Daily Ralph Haddock, NP        OBJECTIVE: There were no vitals filed for this visit.   There is no height or weight on file to calculate BMI.    ECOG FS:0 - Asymptomatic  General: Well-developed, well-nourished, no acute distress. Eyes: Pink conjunctiva, anicteric sclera. Lungs: Clear to auscultation bilaterally. Heart: Regular rate and rhythm. No rubs, murmurs, or gallops. Abdomen: Soft, nontender, nondistended. No organomegaly noted, normoactive bowel sounds. Musculoskeletal: No edema, cyanosis, or clubbing. Neuro: Alert, answering all questions appropriately. Cranial nerves grossly intact. Skin: No rashes or petechiae noted. Psych: Normal affect.   LAB RESULTS:  Lab Results  Component Value Date   NA 139 04/04/2016   K 4.5 04/04/2016   CL 99 04/04/2016   CO2 22 04/04/2016   GLUCOSE 137 (H) 04/04/2016   BUN 19 04/04/2016   CREATININE 1.56 (H) 04/04/2016   CALCIUM 9.9 04/04/2016   PROT 7.0 04/04/2016   ALBUMIN 3.5 (L) 04/04/2016   AST 9 04/04/2016   ALT 6 04/04/2016   ALKPHOS 69 04/04/2016   BILITOT 0.3 04/04/2016   GFRNONAA 46 (L) 04/04/2016   GFRAA 53 (L) 04/04/2016    Lab Results  Component Value Date   WBC 14.5 05/01/2014   NEUTROABS 11 05/01/2014   HGB 14.3 05/01/2014   HCT 43 05/01/2014   MCV 96 04/25/2014   PLT 277 05/01/2014     STUDIES: Dg Chest 1 View  Result Date: 04/28/2016 CLINICAL DATA:  Post bronchoscopy.  Right side mass. EXAM: CHEST 1 VIEW COMPARISON:  CT 04/10/2016 FINDINGS: Large mass is again noted in the right hemithorax, stable. No pneumothorax following bronchoscopy.  Elevation of the right hemidiaphragm. Left lung is clear. No effusions. Heart is normal size. IMPRESSION: Large right lung mass.  No pneumothorax. Electronically Signed   By: Ralph Lopez M.D.   On: 04/28/2016 14:53   Ct Chest W Contrast  Result Date: 04/10/2016 CLINICAL DATA:  Right lung mass appreciable on recent chest radiograph EXAM: CT CHEST WITH CONTRAST TECHNIQUE: Multidetector CT imaging of the chest was performed during intravenous contrast administration. CONTRAST:  75m ISOVUE-300 IOPAMIDOL (ISOVUE-300) INJECTION 61% COMPARISON:  Chest radiograph April 04, 2016 FINDINGS: Cardiovascular: There is no appreciable thoracic aortic aneurysm or dissection. There is atherosclerotic calcification in the aorta as well as foci of coronary artery calcification at multiple sites. The visualized great vessels show mild calcification at the origins of the left common and left subclavian arteries. There is a minimal amount of pericardial fluid present. There is a mass which is causing collapse of the right upper lobe with extension into the  pulmonary venous confluence from the right side. There is no major vessel pulmonary arterial embolus seen. Mediastinum/Nodes: Mass which appears to arise from the right upper lobe extends into the right superior mediastinum at the level of the superior vena cava. This mass does not invade the superior vena cava. There are multiple small lymph nodes in the right paratracheal region. The largest of these lymph nodes measures 1.3 x 1.2 cm. There is an enlarged sub- carinal lymph node measuring 1.7 x 1.2 cm. There is right hilar adenopathy immediately adjacent to the right upper lobe mass measuring 2.0 x 1.8 cm. Thyroid appears unremarkable.  There is a small hiatal hernia. Lungs/Pleura: There is a mass with consolidation collapsing the right upper lobe. It is difficult to separate mass from surrounding consolidation. The area that is felt to represent primarily mass measures 8.7  x 6.2 cm. Mass extends into the right upper lobe pulmonary vein and into the central pulmonary vein confluence near the junction with the left atrium. Tumor extending into the pulmonary venous region new measures 4.3 x 2.1 by 2.5 cm. There is a fairly small pleural effusion on the right. On axial slice 89 series 3, there is a 6 mm nodular opacity in the lateral segment of the right lower lobe. On axial slice 831 series 3, there is a 4 mm nodular opacity in the lateral segment of the left lower lobe. On axial slice 84 series 3, there is a 5 mm nodular opacity abutting the major fissure on the left. Upper Abdomen: There is incomplete visualization of a left renal cyst measuring 1.9 x 1.8 cm arising medially from the upper pole region. Adrenals appear normal bilaterally. Visualized upper abdominal structures otherwise appear unremarkable. Musculoskeletal: There is degenerative change in the thoracic spine noted. There are no blastic or lytic bone lesions. IMPRESSION: Large mass arising from the right upper lobe with surrounding right upper lobe consolidation and right upper lobe collapse. There is apparent invasion of tumor into the right upper lobe pulmonary vein extending into the confluence of the pulmonary veins near the insertion with the left atrium. There is adenopathy in the right paratracheal and right hilar regions as well as in the sub- carinal region. Scattered small nodular opacities bilaterally may well represent small parenchymal metastases. No adrenal lesions evident. There is a fairly small right pleural effusion. There is coronary artery calcification as well as atherosclerotic calcification in the aorta/aortic atherosclerosis. These results will be called to the ordering clinician or representative by the Radiologist Assistant, and communication documented in the PACS or zVision Dashboard. Electronically Signed   By: Lowella Grip III M.D.   On: 04/10/2016 09:29   Nm Pet Image Initial (pi)  Skull Base To Thigh  Result Date: 05/02/2016 CLINICAL DATA:  Initial treatment strategy for lung mass. EXAM: NUCLEAR MEDICINE PET SKULL BASE TO THIGH TECHNIQUE: Twelve point to mCi F-18 FDG was injected intravenously. Full-ring PET imaging was performed from the skull base to thigh after the radiotracer. CT data was obtained and used for attenuation correction and anatomic localization. FASTING BLOOD GLUCOSE:  Value: 136 mg/dl COMPARISON:  Chest CT 04/10/2016 FINDINGS: NECK No hypermetabolic lymph nodes in the neck. Focal area of hypermetabolism is noted anterior to the mandible. No CT correlate is identified. The mandible is intact. This may reflect dental or gum disease. CHEST Large central right upper lobe lung mass with a drowned/obstructed right upper lobe. This is markedly hypermetabolic with SUV max of 51.7. Most of the hypermetabolism is  centered in the medial aspect of the right upper lobe along with mediastinal and right hilar adenopathy. No contralateral adenopathy or subcarinal adenopathy. Multiple pulmonary nodules are noted on the chest CT consistent with pulmonary metastatic disease. The largest nodule at the right lung base measures 9 mm and is hypermetabolic with SUV max of 5.0. The other nodules are below the limits of sensitivity for PET imaging. Small right pleural effusion. ABDOMEN/PELVIS No abnormal hypermetabolic activity within the liver, pancreas, adrenal glands, or spleen. No hypermetabolic lymph nodes in the abdomen or pelvis. SKELETON There is a lytic lesion involving the left pubic bone near the pubic symphysis which is hypermetabolic. SUV max is 13.8. Findings consistent with a metastatic lesion. I do not however see any other definite osseous metastasis. IMPRESSION: 1. Large central right upper lobe lung mass consistent with neoplasm and associated mediastinal and right hilar adenopathy. The right upper lobe has obstructed/drowned. 2. Pulmonary metastatic disease. 3. Single lytic  metastatic bone lesion involving the left pubic bone. Electronically Signed   By: Marijo Sanes M.D.   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 Biopsy confirmed squamous cell carcinoma. MRI the brain is scheduled for May 14, 2016 to complete the staging workup. Patient will benefit from concurrent chemotherapy and radiation therapy and had consultation with radiation oncology today. Initially will treat patient as stage III disease and monitor his isolated metastasis in his left pubic bone. Return to clinic on May 15, 2016 to initiate cycle 1 of weekly carboplatinum and Taxol. Patient will initiate his daily XRT on May 14, 2016.  2. Renal insufficiency: Unclear patient's baseline, monitor.  Approximately 30 minutes was spent in discussion of which greater than 50% was consultation.  Patient expressed understanding and was in agreement with this plan. He also understands that He can call clinic at any time with any questions, concerns, or complaints.   Primary cancer of right upper lobe of lung [facility])   Staging form: Lung, AJCC 7th Edition   - Clinical stage from 05/09/2016: Stage IV (T3, N2, M1b) - Signed by Lloyd Huger, MD on 05/09/2016  Lloyd Huger, MD   05/09/2016 4:12 PM

## 2016-05-06 ENCOUNTER — Ambulatory Visit
Admission: RE | Admit: 2016-05-06 | Discharge: 2016-05-06 | Disposition: A | Discharge status: Home / Self Care | Payer: BLUE CROSS/BLUE SHIELD | Source: Ambulatory Visit | Attending: Radiation Oncology | Admitting: Radiation Oncology

## 2016-05-06 ENCOUNTER — Inpatient Hospital Stay: Payer: BLUE CROSS/BLUE SHIELD | Attending: Oncology | Admitting: Oncology

## 2016-05-06 ENCOUNTER — Encounter: Payer: Self-pay | Admitting: Radiation Oncology

## 2016-05-06 VITALS — BP 147/86 | HR 75 | Temp 98.0°F | Ht 66.0 in | Wt 159.8 lb

## 2016-05-06 DIAGNOSIS — I129 Hypertensive chronic kidney disease with stage 1 through stage 4 chronic kidney disease, or unspecified chronic kidney disease: Secondary | ICD-10-CM | POA: Insufficient documentation

## 2016-05-06 DIAGNOSIS — J90 Pleural effusion, not elsewhere classified: Secondary | ICD-10-CM | POA: Insufficient documentation

## 2016-05-06 DIAGNOSIS — Z7982 Long term (current) use of aspirin: Secondary | ICD-10-CM | POA: Insufficient documentation

## 2016-05-06 DIAGNOSIS — Z79899 Other long term (current) drug therapy: Secondary | ICD-10-CM | POA: Diagnosis not present

## 2016-05-06 DIAGNOSIS — I700 Atherosclerosis of aorta: Secondary | ICD-10-CM | POA: Insufficient documentation

## 2016-05-06 DIAGNOSIS — C3411 Malignant neoplasm of upper lobe, right bronchus or lung: Secondary | ICD-10-CM | POA: Insufficient documentation

## 2016-05-06 DIAGNOSIS — C7951 Secondary malignant neoplasm of bone: Secondary | ICD-10-CM | POA: Insufficient documentation

## 2016-05-06 DIAGNOSIS — E119 Type 2 diabetes mellitus without complications: Secondary | ICD-10-CM | POA: Insufficient documentation

## 2016-05-06 DIAGNOSIS — E1122 Type 2 diabetes mellitus with diabetic chronic kidney disease: Secondary | ICD-10-CM | POA: Diagnosis not present

## 2016-05-06 DIAGNOSIS — M25511 Pain in right shoulder: Secondary | ICD-10-CM | POA: Diagnosis not present

## 2016-05-06 DIAGNOSIS — R630 Anorexia: Secondary | ICD-10-CM | POA: Diagnosis not present

## 2016-05-06 DIAGNOSIS — Z800 Family history of malignant neoplasm of digestive organs: Secondary | ICD-10-CM | POA: Diagnosis not present

## 2016-05-06 DIAGNOSIS — F419 Anxiety disorder, unspecified: Secondary | ICD-10-CM | POA: Diagnosis not present

## 2016-05-06 DIAGNOSIS — Z801 Family history of malignant neoplasm of trachea, bronchus and lung: Secondary | ICD-10-CM | POA: Insufficient documentation

## 2016-05-06 DIAGNOSIS — Z87891 Personal history of nicotine dependence: Secondary | ICD-10-CM | POA: Diagnosis not present

## 2016-05-06 DIAGNOSIS — J449 Chronic obstructive pulmonary disease, unspecified: Secondary | ICD-10-CM | POA: Diagnosis not present

## 2016-05-06 DIAGNOSIS — Z803 Family history of malignant neoplasm of breast: Secondary | ICD-10-CM | POA: Diagnosis not present

## 2016-05-06 DIAGNOSIS — K449 Diaphragmatic hernia without obstruction or gangrene: Secondary | ICD-10-CM | POA: Insufficient documentation

## 2016-05-06 DIAGNOSIS — E785 Hyperlipidemia, unspecified: Secondary | ICD-10-CM | POA: Insufficient documentation

## 2016-05-06 DIAGNOSIS — R634 Abnormal weight loss: Secondary | ICD-10-CM | POA: Diagnosis not present

## 2016-05-06 DIAGNOSIS — N189 Chronic kidney disease, unspecified: Secondary | ICD-10-CM | POA: Insufficient documentation

## 2016-05-06 DIAGNOSIS — G939 Disorder of brain, unspecified: Secondary | ICD-10-CM | POA: Insufficient documentation

## 2016-05-06 DIAGNOSIS — F329 Major depressive disorder, single episode, unspecified: Secondary | ICD-10-CM | POA: Insufficient documentation

## 2016-05-06 DIAGNOSIS — Z5111 Encounter for antineoplastic chemotherapy: Secondary | ICD-10-CM | POA: Insufficient documentation

## 2016-05-06 NOTE — Consult Note (Signed)
NEW PATIENT EVALUATION  Name: Ralph Lopez  MRN: [id number]  Date:   05/06/2016     DOB: 01-May-1951   This 65 y.o. 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  REFERRING PHYSICIAN: Kathrine Haddock, NP  CHIEF COMPLAINT:  Chief Complaint  Patient presents with  . Lung Cancer    Initial consult    DIAGNOSIS: The encounter diagnosis was Malignant neoplasm of right upper lobe of lung ([place]).   PREVIOUS INVESTIGATIONS:  CT scans and PET/CT scans reviewed Cytology report reviewed Clinical notes reviewed   HPI: Patient is a 65 year old male former heavy smoker who presented with progressive cough for the past several months. Eventually chest x-ray was performed showing a large right upper lobe mass. CT scan PET CT scan confirmed a large central right upper lobe mass consistent with malignancy with associated mediastinal and right hilar adenopathy. There is also evidence of pulmonary metastatic disease as well as a single lytic metastatic bone lesion involving the left pubic symphysis. Patient underwent bronchoscopy which was positive on cytology for squamous cell carcinoma. He is seen today for opinion regarding radiation therapy. He still has a cough no hemoptysis. Appetite is poor he has lost about 10 pounds. He does have some right shoulder pain and specifically denies pain in his pubic region.  PLANNED TREATMENT REGIMEN: Concurrent chemoradiation with palliative intent  PAST MEDICAL HISTORY:  has a past medical history of Anxiety; Chronic kidney disease; COPD (chronic obstructive pulmonary disease) ([place]); Depression; Diabetes mellitus without complication ([place]); Hyperlipidemia; and Hypertension.    PAST SURGICAL HISTORY:  Past Surgical History:  Procedure Laterality Date  . EYE SURGERY    . FLEXIBLE BRONCHOSCOPY N/A 04/28/2016   Procedure: FLEXIBLE BRONCHOSCOPY;  Surgeon: [name], MD;  Location: [facility]  ORS;  Service: Pulmonary;  Laterality: N/A;  . MANDIBLE SURGERY      FAMILY HISTORY: family history includes Breast cancer in his mother and sister; Colon cancer in his brother; Diabetes in his mother; Heart disease in his father; Hypertension in his mother; Lung cancer in his brother; Thrombosis in his mother.  SOCIAL HISTORY:  reports that he quit smoking about 2 years ago. He quit after 20.00 years of use. He has never used smokeless tobacco. He reports that he does not drink alcohol or use drugs.  ALLERGIES: Review of patient's allergies indicates no known allergies.  MEDICATIONS:  Current Outpatient Prescriptions  Medication Sig Dispense Refill  . albuterol (PROVENTIL HFA;VENTOLIN HFA) 108 (90 BASE) MCG/ACT inhaler Inhale 2 puffs into the lungs every 6 (six) hours as needed for wheezing or shortness of breath.    Marland Kitchen aspirin EC 81 MG tablet Take 81 mg by mouth daily.    Marland Kitchen atenolol (TENORMIN) 100 MG tablet TAKE ONE TABLET BY MOUTH ONCE DAILY 90 tablet 0  . atorvastatin (LIPITOR) 10 MG tablet TAKE ONE TABLET BY MOUTH ONCE DAILY 30 tablet 6  . BYDUREON 2 MG PEN INJECT '2MG'$  SUBCUTANEOUSLY WEEKLY 4 each 0  . chlorpheniramine-HYDROcodone (TUSSIONEX PENNKINETIC ER) 10-8 MG/5ML SUER Take 5 mLs by mouth every 12 (twelve) hours as needed for cough. 473 mL 0  . lisinopril (PRINIVIL,ZESTRIL) 40 MG tablet Take 1 tablet (40 mg total) by mouth daily. 90 tablet 1  . oxyCODONE-acetaminophen (PERCOCET/ROXICET) 5-325 MG tablet Take 1-2 tablets by mouth every 6 (six) hours as needed for moderate pain or severe pain. 60 tablet 0  . pantoprazole (PROTONIX) 40 MG tablet Take  1 tablet (40 mg total) by mouth daily. 90 tablet 3  . ranitidine (ZANTAC) 150 MG tablet Take 150 mg by mouth daily.    Nelva Nay SOLOSTAR 300 UNIT/ML SOPN INJECT DAILY AS DIRECTED 4 pen 6  . traMADol (ULTRAM) 50 MG tablet Take 1 tablet (50 mg total) by mouth every 8 (eight) hours as needed. (Patient not taking: Reported on 04/28/2016) 30 tablet  0  . umeclidinium-vilanterol (ANORO ELLIPTA) 62.5-25 MCG/INH AEPB Inhale 1 puff into the lungs daily. 1 each 12   Current Facility-Administered Medications  Medication Dose Route Frequency Provider Last Rate Last Dose  . umeclidinium-vilanterol (ANORO ELLIPTA) 62.5-25 MCG/INH 1 puff  1 puff Inhalation Daily Kathrine Haddock, NP        ECOG PERFORMANCE STATUS:  1 - Symptomatic but completely ambulatory  REVIEW OF SYSTEMS: Except for the weight loss cough and right shoulder pain  Patient denies any weight loss, fatigue, weakness, fever, chills or night sweats. Patient denies any loss of vision, blurred vision. Patient denies any ringing  of the ears or hearing loss. No irregular heartbeat. Patient denies heart murmur or history of fainting. Patient denies any chest pain or pain radiating to her upper extremities. Patient denies any shortness of breath, difficulty breathing at night, cough or hemoptysis. Patient denies any swelling in the lower legs. Patient denies any nausea vomiting, vomiting of blood, or coffee ground material in the vomitus. Patient denies any stomach pain. Patient states has had normal bowel movements no significant constipation or diarrhea. Patient denies any dysuria, hematuria or significant nocturia. Patient denies any problems walking, swelling in the joints or loss of balance. Patient denies any skin changes, loss of hair or loss of weight. Patient denies any excessive worrying or anxiety or significant depression. Patient denies any problems with insomnia. Patient denies excessive thirst, polyuria, polydipsia. Patient denies any swollen glands, patient denies easy bruising or easy bleeding. Patient denies any recent infections, allergies or URI. Patient "s visual fields have not changed significantly in recent time.   PHYSICAL EXAM: BP (!) 147/86   Pulse 75   Temp 98 F (36.7 C)   Ht '5\' 6"'$  (1.676 m)   Wt 159 lb 13.3 oz (72.5 kg)   BMI 25.80 kg/m  Range of motion of his  right upper extremity does not elicit pain he has decreased breath sounds in his right upper lobe. Deep palpation of his pubic symphysis does not elicit pain. Well-developed well-nourished patient in NAD. HEENT reveals PERLA, EOMI, discs not visualized.  Oral cavity is clear. No oral mucosal lesions are identified. Neck is clear without evidence of cervical or supraclavicular adenopathy. Lungs are clear to A&P. Cardiac examination is essentially unremarkable with regular rate and rhythm without murmur rub or thrill. Abdomen is benign with no organomegaly or masses noted. Motor sensory and DTR levels are equal and symmetric in the upper and lower extremities. Cranial nerves II through XII are grossly intact. Proprioception is intact. No peripheral adenopathy or edema is identified. No motor or sensory levels are noted. Crude visual fields are within normal range.  LABORATORY DATA: Cytology reports reviewed    RADIOLOGY RESULTS: PET CT and CT scans reviewed MRI of brain pending   IMPRESSION: Stage for screw cell carcinoma along with single lytic lesion in his symphysis pubis as well as pulmonary metastasis in 65 year old male with large necrotic right upper lobe squamous cell carcinoma  PLAN: I did discuss the case personally with medical oncology. We'll go ahead with chemoradiation in  a palliative mode. I would plan on delivering 4000 cGy over 4 weeks to his right upper lobe and evaluate for response. Hopefully prevent further atelectasis of his lung, may improve some of his right shoulder pain if this is tumor involvement with nerve roots. Risks and benefits of treatment including possible dysphasia from radiation esophagitis, fatigue skin reaction alteration of blood counts all were discussed in detail with the patient. I've also offer palliative radiation therapy to his symphysis pubis should this become painful in the future. I personally set up and ordered CT simulation for tomorrow.There will be  extra effort by both professional staff as well as technical staff to coordinate and manage concurrent chemoradiation and ensuing side effects during his treatments.   I would like to take this opportunity to thank you for allowing me to participate in the care of your patient.Armstead Peaks., MD

## 2016-05-06 NOTE — Patient Instructions (Signed)

## 2016-05-06 NOTE — Progress Notes (Signed)
Written and verbal information given regarding side effects, lab appointments and hygiene.  Patient and family verbalized understanding of all information; questions answered to their satisfaction.  15 minutes spent with patient and family going over information and answering questions.

## 2016-05-07 ENCOUNTER — Ambulatory Visit
Admission: RE | Admit: 2016-05-07 | Discharge: 2016-05-07 | Disposition: A | Discharge status: Home / Self Care | Payer: BLUE CROSS/BLUE SHIELD | Source: Ambulatory Visit | Attending: Radiation Oncology | Admitting: Radiation Oncology

## 2016-05-07 DIAGNOSIS — C3411 Malignant neoplasm of upper lobe, right bronchus or lung: Secondary | ICD-10-CM | POA: Diagnosis not present

## 2016-05-08 ENCOUNTER — Other Ambulatory Visit: Payer: Self-pay | Admitting: Family Medicine

## 2016-05-08 ENCOUNTER — Inpatient Hospital Stay: Payer: BLUE CROSS/BLUE SHIELD

## 2016-05-08 NOTE — Telephone Encounter (Signed)
Call pt 

## 2016-05-09 MED ORDER — PROCHLORPERAZINE MALEATE 10 MG PO TABS: 10.0000 mg | ORAL_TABLET | Freq: Four times a day (QID) | ORAL | 1 refills | Status: DC | PRN

## 2016-05-09 MED ORDER — LIDOCAINE-PRILOCAINE 2.5-2.5 % EX CREA: TOPICAL_CREAM | CUTANEOUS | 3 refills | Status: DC

## 2016-05-09 MED ORDER — ONDANSETRON HCL 8 MG PO TABS: 8.0000 mg | ORAL_TABLET | Freq: Two times a day (BID) | ORAL | 1 refills | Status: DC | PRN

## 2016-05-09 NOTE — Progress Notes (Signed)
ALERT: Recent Pathways Treatment decision is outdated. Please await next Pathways decision 

## 2016-05-09 NOTE — Progress Notes (Signed)
START OFF PATHWAY REGIMEN - Non-Small Cell Lung  Off Pathway: CUSTOM [facility]): Carboplatin (Weekly Low Dose) + Paclitaxel + Radiation Therapy  [id number]:CUSTOM [facility]): Carboplatin (Weekly Low Dose) + Paclitaxel + Radiation Therapy:   A cycle is every 7 weeks:     Paclitaxel (Taxol(R)) 30 mg/m2 IV q7 days Dose Mod: None     Carboplatin (Paraplatin(R)) AUC = 1 IV q7 days Dose Mod: None Additional Orders: Ref: Vlacich G, et al. Oncologist. 2012; 17(5):673-81.  Disclaimer: Custom regimens, while included in the [facility] Regimen Library, are validated by a specific customer site only.  All use should be limited to the organization for whom the regimen was built.  **Always confirm dose/schedule in your pharmacy ordering system**    Patient Characteristics: Stage IV Metastatic, Squamous, PS = 0, 1, First Line, PD-L1 Expression Positive 1-49% (TPS) / Negative / Not Tested / Not a Candidate for Immunotherapy AJCC M Stage: X AJCC N Stage: X AJCC T Stage: X Current Disease Status: Distant Metastases AJCC Stage Grouping: IV Histology: Squamous Cell Line of therapy: First Line PD-L1 Expression Status: Quantity Not Sufficient Performance Status: PS = 0, 1 Would you be surprised if this patient died  in the next year? I would NOT be surprised if this patient died in the next year  Intent of Therapy: Non-Curative / Palliative Intent, Discussed with Patient

## 2016-05-12 DIAGNOSIS — C3411 Malignant neoplasm of upper lobe, right bronchus or lung: Secondary | ICD-10-CM | POA: Diagnosis not present

## 2016-05-14 ENCOUNTER — Ambulatory Visit
Admission: RE | Admit: 2016-05-14 | Discharge: 2016-05-14 | Disposition: A | Discharge status: Home / Self Care | Payer: BLUE CROSS/BLUE SHIELD | Source: Ambulatory Visit | Attending: Radiation Oncology | Admitting: Radiation Oncology

## 2016-05-14 ENCOUNTER — Other Ambulatory Visit: Payer: Self-pay | Admitting: Oncology

## 2016-05-14 ENCOUNTER — Encounter: Payer: Self-pay | Admitting: Oncology

## 2016-05-14 ENCOUNTER — Ambulatory Visit
Admission: RE | Admit: 2016-05-14 | Discharge: 2016-05-14 | Disposition: A | Discharge status: Home / Self Care | Payer: BLUE CROSS/BLUE SHIELD | Source: Ambulatory Visit | Attending: Oncology | Admitting: Oncology

## 2016-05-14 DIAGNOSIS — R938 Abnormal findings on diagnostic imaging of other specified body structures: Secondary | ICD-10-CM | POA: Diagnosis not present

## 2016-05-14 DIAGNOSIS — R9082 White matter disease, unspecified: Secondary | ICD-10-CM | POA: Diagnosis not present

## 2016-05-14 DIAGNOSIS — C3490 Malignant neoplasm of unspecified part of unspecified bronchus or lung: Secondary | ICD-10-CM | POA: Insufficient documentation

## 2016-05-14 DIAGNOSIS — I6782 Cerebral ischemia: Secondary | ICD-10-CM | POA: Diagnosis not present

## 2016-05-14 DIAGNOSIS — C3411 Malignant neoplasm of upper lobe, right bronchus or lung: Secondary | ICD-10-CM | POA: Diagnosis not present

## 2016-05-14 LAB — POCT I-STAT CREATININE: CREATININE: 1.2 mg/dL (ref 0.61–1.24)

## 2016-05-14 MED ORDER — GADOBENATE DIMEGLUMINE 529 MG/ML IV SOLN
15.0000 mL | Freq: Once | INTRAVENOUS | Status: AC | PRN
  Administered 2016-05-14: 15 mL via INTRAVENOUS

## 2016-05-14 NOTE — Progress Notes (Signed)
[place]  [phone] [phone]  ID: Ralph Lopez OB: October 02, 1950  MR#: [id number]  EPP#:[id number]  Patient Care Team: Kathrine Haddock, NP as PCP - General (Nurse Practitioner)  CHIEF COMPLAINT: Stage IV squamous cell carcinoma of the right upper lobe lung with metastasis to the left pubic bone.  INTERVAL HISTORY: Patient returns to clinic today for further evaluation and initiation of cycle 1 of weekly carboplatinum and Taxol along with his daily XRT.  He continues to feel well and is asymptomatic. He denies any fevers. He has no neurologic complaints. He has a good appetite and denies weight loss. He continues to have a cough, but denies shortness of breath, chest pain, or hemoptysis. He has no nausea, vomiting, constipation, or diarrhea. He has no urinary complaints. Patient otherwise feels well and offers no further specific complaints.  REVIEW OF SYSTEMS:   Review of Systems  Constitutional: Negative.  Negative for fever, malaise/fatigue and weight loss.  Respiratory: Positive for cough. Negative for hemoptysis and shortness of breath.   Cardiovascular: Negative.  Negative for chest pain and leg swelling.  Gastrointestinal: Negative.  Negative for abdominal pain.  Musculoskeletal: Negative.   Neurological: Negative.  Negative for sensory change and weakness.  Psychiatric/Behavioral: Negative.  The patient is not nervous/anxious.    As per HPI. Otherwise, a complete review of systems is negative.   PAST MEDICAL HISTORY: Past Medical History:  Diagnosis Date  . Anxiety   . Chronic kidney disease   . COPD (chronic obstructive pulmonary disease) ([place])   . Depression   . Diabetes mellitus without complication ([place])   . Hyperlipidemia   . Hypertension     PAST SURGICAL HISTORY: Past Surgical History:  Procedure Laterality Date  . EYE SURGERY    . FLEXIBLE BRONCHOSCOPY N/A 04/28/2016   Procedure: FLEXIBLE BRONCHOSCOPY;  Surgeon:  [name], MD;  Location: [facility] ORS;  Service: Pulmonary;  Laterality: N/A;  . MANDIBLE SURGERY      FAMILY HISTORY: Family History  Problem Relation Age of Onset  . Diabetes Mother   . Hypertension Mother   . Breast cancer Mother   . Thrombosis Mother   . Heart disease Father     MI  . Breast cancer Sister   . Colon cancer Brother   . Lung cancer Brother     ADVANCED DIRECTIVES (Y/N):  N  HEALTH MAINTENANCE: Social History  Substance Use Topics  . Smoking status: Former Smoker    Years: 20.00    Quit date: 04/22/2014  . Smokeless tobacco: Never Used  . Alcohol use No     Colonoscopy:  PAP:  Bone density:  Lipid panel:  No Known Allergies  Current Outpatient Prescriptions  Medication Sig Dispense Refill  . albuterol (PROVENTIL HFA;VENTOLIN HFA) 108 (90 BASE) MCG/ACT inhaler Inhale 2 puffs into the lungs every 6 (six) hours as needed for wheezing or shortness of breath.    Marland Kitchen aspirin EC 81 MG tablet Take 81 mg by mouth daily.    Marland Kitchen atenolol (TENORMIN) 100 MG tablet TAKE ONE TABLET BY MOUTH ONCE DAILY 90 tablet 0  . atorvastatin (LIPITOR) 10 MG tablet TAKE ONE TABLET BY MOUTH ONCE DAILY (Patient taking differently: TAKE ONE TABLET BY MOUTH ONCE DAILY AT BEDTIME) 30 tablet 6  . BYDUREON 2 MG PEN INJECT '2MG'$  SUBCUTANEOUSLY WEEKLY (Patient taking differently: INJECT '2MG'$  SUBCUTANEOUSLY WEEKLY ON THURSDAYS) 4 each 0  . chlorpheniramine-HYDROcodone (TUSSIONEX PENNKINETIC ER) 10-8 MG/5ML SUER Take 5 mLs by mouth every  12 (twelve) hours as needed for cough. 473 mL 0  . lidocaine-prilocaine (EMLA) cream Apply to port 1-2 hours prior to chemotherapy appointment. Cover with plastic wrap. 30 g 3  . lisinopril (PRINIVIL,ZESTRIL) 40 MG tablet Take 1 tablet (40 mg total) by mouth daily. (Patient taking differently: Take 40 mg by mouth at bedtime. ) 90 tablet 1  . LORazepam (ATIVAN) 0.5 MG tablet Take 1 tablet (0.5 mg total) by mouth 2 (two) times daily. 60 tablet 0  . ondansetron  (ZOFRAN) 8 MG tablet Take 1 tablet (8 mg total) by mouth 2 (two) times daily as needed for refractory nausea / vomiting. 30 tablet 1  . oxyCODONE-acetaminophen (PERCOCET/ROXICET) 5-325 MG tablet Take 1-2 tablets by mouth every 6 (six) hours as needed for moderate pain or severe pain. 60 tablet 0  . pantoprazole (PROTONIX) 40 MG tablet Take 1 tablet (40 mg total) by mouth daily. 90 tablet 3  . prochlorperazine (COMPAZINE) 10 MG tablet Take 1 tablet (10 mg total) by mouth every 6 (six) hours as needed (Nausea or vomiting). 30 tablet 1  . ranitidine (ZANTAC) 150 MG tablet Take 150 mg by mouth daily as needed for heartburn.     Nelva Nay SOLOSTAR 300 UNIT/ML SOPN INJECT DAILY AS DIRECTED (Patient taking differently: INJECT 30 UNITS SUBCUTANEOUSLY IN THE MORNING) 4 pen 6  . umeclidinium-vilanterol (ANORO ELLIPTA) 62.5-25 MCG/INH AEPB Inhale 1 puff into the lungs daily. (Patient not taking: Reported on 05/16/2016) 1 each 12   Current Facility-Administered Medications  Medication Dose Route Frequency Provider Last Rate Last Dose  . ondansetron (ZOFRAN) 4 mg in sodium chloride 0.9 % 50 mL IVPB  4 mg Intravenous Q6H PRN Kimberly A Stegmayer, PA-C      . umeclidinium-vilanterol (ANORO ELLIPTA) 62.5-25 MCG/INH 1 puff  1 puff Inhalation Daily Kathrine Haddock, NP        OBJECTIVE: Vitals:   05/15/16 0903  BP: (!) 149/88  Pulse: 82  Resp: 18  Temp: (!) 96.9 F (36.1 C)     Body mass index is 25.98 kg/m.    ECOG FS:0 - Asymptomatic  General: Well-developed, well-nourished, no acute distress. Eyes: Pink conjunctiva, anicteric sclera. Lungs: Clear to auscultation bilaterally. Heart: Regular rate and rhythm. No rubs, murmurs, or gallops. Abdomen: Soft, nontender, nondistended. No organomegaly noted, normoactive bowel sounds. Musculoskeletal: No edema, cyanosis, or clubbing. Neuro: Alert, answering all questions appropriately. Cranial nerves grossly intact. Skin: No rashes or petechiae noted. Psych:  Normal affect.   LAB RESULTS:  Lab Results  Component Value Date   NA 135 05/15/2016   K 4.0 05/15/2016   CL 100 (L) 05/15/2016   CO2 26 05/15/2016   GLUCOSE 153 (H) 05/15/2016   BUN 16 05/15/2016   CREATININE 1.31 (H) 05/15/2016   CALCIUM 10.2 05/15/2016   PROT 7.8 05/15/2016   ALBUMIN 3.3 (L) 05/15/2016   AST 20 05/15/2016   ALT 16 (L) 05/15/2016   ALKPHOS 65 05/15/2016   BILITOT 0.3 05/15/2016   GFRNONAA 56 (L) 05/15/2016   GFRAA >60 05/15/2016    Lab Results  Component Value Date   WBC 14.3 (H) 05/15/2016   NEUTROABS 11.5 (H) 05/15/2016   HGB 10.8 (L) 05/15/2016   HCT 33.3 (L) 05/15/2016   MCV 80.4 05/15/2016   PLT 391 05/15/2016     STUDIES: Dg Chest 1 View  Result Date: 04/28/2016 CLINICAL DATA:  Post bronchoscopy.  Right side mass. EXAM: CHEST 1 VIEW COMPARISON:  CT 04/10/2016 FINDINGS: Large mass is again noted  in the right hemithorax, stable. No pneumothorax following bronchoscopy. Elevation of the right hemidiaphragm. Left lung is clear. No effusions. Heart is normal size. IMPRESSION: Large right lung mass.  No pneumothorax. Electronically Signed   By: Rolm Baptise M.D.   On: 04/28/2016 14:53   Mr Jeri Cos BO Contrast  Result Date: 05/14/2016 CLINICAL DATA:  Lung cancer staging EXAM: MRI HEAD WITHOUT AND WITH CONTRAST TECHNIQUE: Multiplanar, multiecho pulse sequences of the brain and surrounding structures were obtained without and with intravenous contrast. CONTRAST:  47m MULTIHANCE GADOBENATE DIMEGLUMINE 529 MG/ML IV SOLN COMPARISON:  None. FINDINGS: Brain: Ventricle size normal. Lung volume normal. Negative for acute infarct. Mild hyperintensity in the cerebral white matter bilaterally most consistent with mild chronic microvascular ischemia. Negative for intracranial hemorrhage. 2 mm enhancing nodule in the right frontal cortex, suspicious for metastatic disease. No other suspicious enhancing lesions identified. Leptomeningeal enhancement normal. Vascular:  Normal arterial flow voids. Normal venous enhancement postcontrast administration. Skull and upper cervical spine: Negative Sinuses/Orbits: Negative Other: None IMPRESSION: 2 mm enhancing nodule in the right frontal cortex over the convexity, suspicious for solitary metastatic disease. Short-term interval MRI follow-up is suggested. Mild age-related chronic microvascular ischemic changes in the white matter. Electronically Signed   By: CFranchot GalloM.D.   On: 05/14/2016 13:09   Nm Pet Image Initial (pi) Skull Base To Thigh  Result Date: 05/02/2016 CLINICAL DATA:  Initial treatment strategy for lung mass. EXAM: NUCLEAR MEDICINE PET SKULL BASE TO THIGH TECHNIQUE: Twelve point to mCi F-18 FDG was injected intravenously. Full-ring PET imaging was performed from the skull base to thigh after the radiotracer. CT data was obtained and used for attenuation correction and anatomic localization. FASTING BLOOD GLUCOSE:  Value: 136 mg/dl COMPARISON:  Chest CT 04/10/2016 FINDINGS: NECK No hypermetabolic lymph nodes in the neck. Focal area of hypermetabolism is noted anterior to the mandible. No CT correlate is identified. The mandible is intact. This may reflect dental or gum disease. CHEST Large central right upper lobe lung mass with a drowned/obstructed right upper lobe. This is markedly hypermetabolic with SUV max of 117.5 Most of the hypermetabolism is centered in the medial aspect of the right upper lobe along with mediastinal and right hilar adenopathy. No contralateral adenopathy or subcarinal adenopathy. Multiple pulmonary nodules are noted on the chest CT consistent with pulmonary metastatic disease. The largest nodule at the right lung base measures 9 mm and is hypermetabolic with SUV max of 5.0. The other nodules are below the limits of sensitivity for PET imaging. Small right pleural effusion. ABDOMEN/PELVIS No abnormal hypermetabolic activity within the liver, pancreas, adrenal glands, or spleen. No  hypermetabolic lymph nodes in the abdomen or pelvis. SKELETON There is a lytic lesion involving the left pubic bone near the pubic symphysis which is hypermetabolic. SUV max is 13.8. Findings consistent with a metastatic lesion. I do not however see any other definite osseous metastasis. IMPRESSION: 1. Large central right upper lobe lung mass consistent with neoplasm and associated mediastinal and right hilar adenopathy. The right upper lobe has obstructed/drowned. 2. Pulmonary metastatic disease. 3. Single lytic metastatic bone lesion involving the left pubic bone. Electronically Signed   By: PMarijo SanesM.D.   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 Biopsy confirmed squamous cell carcinoma. MRI the brain noted 2 mm focus concerning for metastatic disease, but currently too small to characterize. Initially will treat patient as stage III disease and monitor his isolated metastasis in his left pubic bone. Proceed with cycle 1 of weekly carboplatinum and Taxol. Continue daily XRT. Return to clinic in 1 week for consideration of cycle 2.  2. Renal insufficiency: Unclear patient's baseline, monitor. 3. Brain lesion: 2 mm focus concerning for metastatic disease, but too small to characterize. Consider repeat brain imaging in 3 months or January 2018.  Approximately 30 minutes was spent in discussion of which greater than 50% was consultation.  Patient expressed understanding and was in agreement with this plan. He also understands that He can call clinic at any time with any questions, concerns, or complaints.   Primary cancer of right upper lobe of lung [facility])   Staging form: Lung, AJCC 7th Edition   - Clinical stage from  05/09/2016: Stage IV (T3, N2, M1b) - Signed by Lloyd Huger, MD on 05/09/2016  Lloyd Huger, MD   05/19/2016 9:23 AM

## 2016-05-15 ENCOUNTER — Ambulatory Visit
Admission: RE | Admit: 2016-05-15 | Discharge: 2016-05-15 | Disposition: A | Discharge status: Home / Self Care | Payer: BLUE CROSS/BLUE SHIELD | Source: Ambulatory Visit | Attending: Radiation Oncology | Admitting: Radiation Oncology

## 2016-05-15 ENCOUNTER — Inpatient Hospital Stay: Payer: BLUE CROSS/BLUE SHIELD

## 2016-05-15 ENCOUNTER — Other Ambulatory Visit: Payer: Self-pay | Admitting: Oncology

## 2016-05-15 ENCOUNTER — Encounter: Payer: Self-pay | Admitting: Oncology

## 2016-05-15 ENCOUNTER — Inpatient Hospital Stay (HOSPITAL_BASED_OUTPATIENT_CLINIC_OR_DEPARTMENT_OTHER): Payer: BLUE CROSS/BLUE SHIELD | Admitting: Oncology

## 2016-05-15 VITALS — BP 149/88 | HR 82 | Temp 96.9°F | Resp 18 | Wt 160.9 lb

## 2016-05-15 DIAGNOSIS — C7951 Secondary malignant neoplasm of bone: Secondary | ICD-10-CM | POA: Diagnosis not present

## 2016-05-15 DIAGNOSIS — G939 Disorder of brain, unspecified: Secondary | ICD-10-CM

## 2016-05-15 DIAGNOSIS — C3411 Malignant neoplasm of upper lobe, right bronchus or lung: Secondary | ICD-10-CM

## 2016-05-15 DIAGNOSIS — Z79899 Other long term (current) drug therapy: Secondary | ICD-10-CM

## 2016-05-15 DIAGNOSIS — I129 Hypertensive chronic kidney disease with stage 1 through stage 4 chronic kidney disease, or unspecified chronic kidney disease: Secondary | ICD-10-CM | POA: Diagnosis not present

## 2016-05-15 DIAGNOSIS — N189 Chronic kidney disease, unspecified: Secondary | ICD-10-CM

## 2016-05-15 LAB — CBC WITH DIFFERENTIAL/PLATELET
BASOS PCT: 0 %
Basophils Absolute: 0.1 10*3/uL (ref 0–0.1)
Eosinophils Absolute: 0.2 10*3/uL (ref 0–0.7)
Eosinophils Relative: 1 %
HEMATOCRIT: 33.3 % — AB (ref 40.0–52.0)
HEMOGLOBIN: 10.8 g/dL — AB (ref 13.0–18.0)
Lymphocytes Relative: 8 %
Lymphs Abs: 1.2 10*3/uL (ref 1.0–3.6)
MCH: 26 pg (ref 26.0–34.0)
MCHC: 32.3 g/dL (ref 32.0–36.0)
MCV: 80.4 fL (ref 80.0–100.0)
MONOS PCT: 10 %
Monocytes Absolute: 1.4 10*3/uL — ABNORMAL HIGH (ref 0.2–1.0)
NEUTROS ABS: 11.5 10*3/uL — AB (ref 1.4–6.5)
NEUTROS PCT: 81 %
Platelets: 391 10*3/uL (ref 150–440)
RBC: 4.14 MIL/uL — ABNORMAL LOW (ref 4.40–5.90)
RDW: 14.7 % — ABNORMAL HIGH (ref 11.5–14.5)
WBC: 14.3 10*3/uL — ABNORMAL HIGH (ref 3.8–10.6)

## 2016-05-15 LAB — COMPREHENSIVE METABOLIC PANEL
ALBUMIN: 3.3 g/dL — AB (ref 3.5–5.0)
ALT: 16 U/L — AB (ref 17–63)
AST: 20 U/L (ref 15–41)
Alkaline Phosphatase: 65 U/L (ref 38–126)
Anion gap: 9 (ref 5–15)
BILIRUBIN TOTAL: 0.3 mg/dL (ref 0.3–1.2)
BUN: 16 mg/dL (ref 6–20)
CALCIUM: 10.2 mg/dL (ref 8.9–10.3)
CO2: 26 mmol/L (ref 22–32)
CREATININE: 1.31 mg/dL — AB (ref 0.61–1.24)
Chloride: 100 mmol/L — ABNORMAL LOW (ref 101–111)
GFR calc Af Amer: >60 mL/min (ref >60)
GFR calc non Af Amer: 56 mL/min — ABNORMAL LOW (ref >60)
GLUCOSE: 153 mg/dL — AB (ref 65–99)
Potassium: 4 mmol/L (ref 3.5–5.1)
Sodium: 135 mmol/L (ref 135–145)
TOTAL PROTEIN: 7.8 g/dL (ref 6.5–8.1)

## 2016-05-15 MED ORDER — PACLITAXEL CHEMO INJECTION 300 MG/50ML
45.0000 mg/m2 | Freq: Once | INTRAVENOUS | Status: AC
  Administered 2016-05-15: 84 mg via INTRAVENOUS
  Filled 2016-05-15: qty 14

## 2016-05-15 MED ORDER — DIPHENHYDRAMINE HCL 50 MG/ML IJ SOLN
25.0000 mg | Freq: Once | INTRAMUSCULAR | Status: AC
  Administered 2016-05-15: 25 mg via INTRAVENOUS
  Filled 2016-05-15: qty 1

## 2016-05-15 MED ORDER — SODIUM CHLORIDE 0.9 % IV SOLN
165.2000 mg | Freq: Once | INTRAVENOUS | Status: AC
  Administered 2016-05-15: 170 mg via INTRAVENOUS
  Filled 2016-05-15: qty 17

## 2016-05-15 MED ORDER — SODIUM CHLORIDE 0.9 % IV SOLN
Freq: Once | INTRAVENOUS | Status: AC
  Administered 2016-05-15: 10:00:00 via INTRAVENOUS
  Filled 2016-05-15: qty 1000

## 2016-05-15 MED ORDER — LORAZEPAM 0.5 MG PO TABS: 0.5000 mg | ORAL_TABLET | Freq: Two times a day (BID) | ORAL | 0 refills | Status: DC

## 2016-05-15 MED ORDER — PROCHLORPERAZINE MALEATE 10 MG PO TABS: 10.0000 mg | ORAL_TABLET | Freq: Four times a day (QID) | ORAL | 1 refills | Status: DC | PRN

## 2016-05-15 MED ORDER — DEXAMETHASONE SODIUM PHOSPHATE 100 MG/10ML IJ SOLN: 10.0000 mg | Freq: Once | INTRAMUSCULAR | Status: DC

## 2016-05-15 MED ORDER — PALONOSETRON HCL INJECTION 0.25 MG/5ML
0.2500 mg | Freq: Once | INTRAVENOUS | Status: AC
  Administered 2016-05-15: 0.25 mg via INTRAVENOUS
  Filled 2016-05-15: qty 5

## 2016-05-15 MED ORDER — LIDOCAINE-PRILOCAINE 2.5-2.5 % EX CREA: TOPICAL_CREAM | CUTANEOUS | 3 refills | Status: DC

## 2016-05-15 MED ORDER — OXYCODONE-ACETAMINOPHEN 5-325 MG PO TABS: 1.0000 | ORAL_TABLET | Freq: Four times a day (QID) | ORAL | 0 refills | Status: DC | PRN

## 2016-05-15 MED ORDER — DEXAMETHASONE SODIUM PHOSPHATE 10 MG/ML IJ SOLN
10.0000 mg | Freq: Once | INTRAMUSCULAR | Status: AC
  Administered 2016-05-15: 10 mg via INTRAVENOUS
  Filled 2016-05-15: qty 1

## 2016-05-15 MED ORDER — FAMOTIDINE IN NACL 20-0.9 MG/50ML-% IV SOLN
20.0000 mg | Freq: Once | INTRAVENOUS | Status: AC
  Administered 2016-05-15: 20 mg via INTRAVENOUS
  Filled 2016-05-15: qty 50

## 2016-05-15 NOTE — Progress Notes (Signed)
States is feeling well. Offers no complaints. 

## 2016-05-16 ENCOUNTER — Other Ambulatory Visit (INDEPENDENT_AMBULATORY_CARE_PROVIDER_SITE_OTHER): Payer: Self-pay | Admitting: Vascular Surgery

## 2016-05-16 ENCOUNTER — Telehealth (INDEPENDENT_AMBULATORY_CARE_PROVIDER_SITE_OTHER): Payer: Self-pay

## 2016-05-16 ENCOUNTER — Ambulatory Visit
Admission: RE | Admit: 2016-05-16 | Discharge: 2016-05-16 | Disposition: A | Discharge status: Home / Self Care | Payer: BLUE CROSS/BLUE SHIELD | Source: Ambulatory Visit | Attending: Radiation Oncology | Admitting: Radiation Oncology

## 2016-05-16 DIAGNOSIS — C3411 Malignant neoplasm of upper lobe, right bronchus or lung: Secondary | ICD-10-CM | POA: Diagnosis not present

## 2016-05-16 MED ORDER — SODIUM CHLORIDE 0.9 % IV SOLN: 4.0000 mg | Freq: Four times a day (QID) | INTRAVENOUS | Status: DC | PRN

## 2016-05-16 NOTE — Telephone Encounter (Signed)
Patient's sister called and canceled the patient's procedure for a port-a-cath placement because per the patient he does not want to have the procedure.

## 2016-05-19 ENCOUNTER — Encounter: Admission: RE | Payer: Self-pay | Source: Ambulatory Visit

## 2016-05-19 ENCOUNTER — Ambulatory Visit
Admission: RE | Admit: 2016-05-19 | Discharge: 2016-05-19 | Disposition: A | Discharge status: Home / Self Care | Payer: BLUE CROSS/BLUE SHIELD | Source: Ambulatory Visit | Attending: Radiation Oncology | Admitting: Radiation Oncology

## 2016-05-19 ENCOUNTER — Ambulatory Visit
Admission: RE | Admit: 2016-05-19 | Payer: BLUE CROSS/BLUE SHIELD | Source: Ambulatory Visit | Admitting: Vascular Surgery

## 2016-05-19 DIAGNOSIS — C3411 Malignant neoplasm of upper lobe, right bronchus or lung: Secondary | ICD-10-CM | POA: Diagnosis not present

## 2016-05-19 SURGERY — PORTA CATH INSERTION
Anesthesia: Moderate Sedation

## 2016-05-20 ENCOUNTER — Ambulatory Visit
Admission: RE | Admit: 2016-05-20 | Discharge: 2016-05-20 | Disposition: A | Discharge status: Home / Self Care | Payer: BLUE CROSS/BLUE SHIELD | Source: Ambulatory Visit | Attending: Radiation Oncology | Admitting: Radiation Oncology

## 2016-05-20 DIAGNOSIS — C3411 Malignant neoplasm of upper lobe, right bronchus or lung: Secondary | ICD-10-CM | POA: Diagnosis not present

## 2016-05-21 ENCOUNTER — Ambulatory Visit
Admission: RE | Admit: 2016-05-21 | Discharge: 2016-05-21 | Disposition: A | Discharge status: Home / Self Care | Payer: BLUE CROSS/BLUE SHIELD | Source: Ambulatory Visit | Attending: Radiation Oncology | Admitting: Radiation Oncology

## 2016-05-21 DIAGNOSIS — C3411 Malignant neoplasm of upper lobe, right bronchus or lung: Secondary | ICD-10-CM | POA: Diagnosis not present

## 2016-05-21 NOTE — Progress Notes (Signed)
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  INTERVAL HISTORY: Patient returns to clinic today for further evaluation and consideration of cycle 2 of weekly carboplatinum and Taxol along with his daily XRT.  He is tolerating his treatments well with only some mild nausea. He currently feels well and is asymptomatic.  He denies any fevers. He has no neurologic complaints. He has a good appetite and denies weight loss. He continues to have a cough, but denies shortness of breath, chest pain, or hemoptysis. He has no vomiting, constipation, or diarrhea. He has no urinary complaints. Patient offers no further specific complaints.  REVIEW OF SYSTEMS:   Review of Systems  Constitutional: Negative.  Negative for fever, malaise/fatigue and weight loss.  Respiratory: Positive for cough. Negative for hemoptysis and shortness of breath.   Cardiovascular: Negative.  Negative for chest pain and leg swelling.  Gastrointestinal: Negative.  Negative for abdominal pain.  Musculoskeletal: Negative.   Neurological: Negative.  Negative for sensory change and weakness.  Psychiatric/Behavioral: Negative.  The patient is not nervous/anxious.    As per HPI. Otherwise, a complete review of systems is negative.   PAST MEDICAL HISTORY: Past Medical History:  Diagnosis Date  . Anxiety   . Chronic kidney disease   . COPD (chronic obstructive pulmonary disease) ([place])   . Depression   . Diabetes mellitus without complication ([place])   . Hyperlipidemia   . Hypertension     PAST SURGICAL HISTORY: Past Surgical History:  Procedure Laterality Date  . EYE SURGERY    . FLEXIBLE BRONCHOSCOPY N/A 04/28/2016   Procedure:  FLEXIBLE BRONCHOSCOPY;  Surgeon: [name], MD;  Location: [facility] ORS;  Service: Pulmonary;  Laterality: N/A;  . MANDIBLE SURGERY      FAMILY HISTORY: Family History  Problem Relation Age of Onset  . Diabetes Mother   . Hypertension Mother   . Breast cancer Mother   . Thrombosis Mother   . Heart disease Father     MI  . Breast cancer Sister   . Colon cancer Brother   . Lung cancer Brother     ADVANCED DIRECTIVES (Y/N):  N  HEALTH MAINTENANCE: Social History  Substance Use Topics  . Smoking status: Former Smoker    Years: 20.00    Quit date: 04/22/2014  . Smokeless tobacco: Never Used  . Alcohol use No     Colonoscopy:  PAP:  Bone density:  Lipid panel:  No Known Allergies  Current Outpatient Prescriptions  Medication Sig Dispense Refill  . albuterol (PROVENTIL HFA;VENTOLIN HFA) 108 (90 BASE) MCG/ACT inhaler Inhale 2 puffs into the lungs every 6 (six) hours as needed for wheezing or shortness of breath.    Marland Kitchen aspirin EC 81 MG tablet Take 81 mg by mouth daily.    Marland Kitchen atenolol (TENORMIN) 100 MG tablet TAKE ONE TABLET BY MOUTH ONCE DAILY 90 tablet 0  . atorvastatin (LIPITOR) 10 MG tablet TAKE ONE TABLET BY MOUTH ONCE DAILY (Patient taking differently: TAKE ONE TABLET BY MOUTH ONCE DAILY AT BEDTIME) 30 tablet 6  . BYDUREON 2 MG PEN INJECT '2MG'$  SUBCUTANEOUSLY WEEKLY (Patient taking differently: INJECT '2MG'$  SUBCUTANEOUSLY WEEKLY ON THURSDAYS) 4 each 0  . chlorpheniramine-HYDROcodone (TUSSIONEX PENNKINETIC ER) 10-8 MG/5ML SUER  Take 5 mLs by mouth every 12 (twelve) hours as needed for cough. 473 mL 0  . lidocaine-prilocaine (EMLA) cream Apply to port 1-2 hours prior to chemotherapy appointment. Cover with plastic wrap. 30 g 3  . lisinopril (PRINIVIL,ZESTRIL) 40 MG tablet Take 1 tablet (40 mg total) by mouth daily. (Patient taking differently: Take 40 mg by mouth at bedtime. ) 90 tablet 1  . LORazepam (ATIVAN) 0.5 MG tablet Take 1 tablet (0.5 mg total) by mouth 2 (two) times daily.  60 tablet 0  . ondansetron (ZOFRAN) 8 MG tablet Take 1 tablet (8 mg total) by mouth 2 (two) times daily as needed for refractory nausea / vomiting. 30 tablet 1  . oxyCODONE-acetaminophen (PERCOCET/ROXICET) 5-325 MG tablet Take 1-2 tablets by mouth every 6 (six) hours as needed for moderate pain or severe pain. 60 tablet 0  . pantoprazole (PROTONIX) 40 MG tablet Take 1 tablet (40 mg total) by mouth daily. 90 tablet 3  . prochlorperazine (COMPAZINE) 10 MG tablet Take 1 tablet (10 mg total) by mouth every 6 (six) hours as needed (Nausea or vomiting). 30 tablet 1  . ranitidine (ZANTAC) 150 MG tablet Take 150 mg by mouth daily as needed for heartburn.     Nelva Nay SOLOSTAR 300 UNIT/ML SOPN INJECT DAILY AS DIRECTED (Patient taking differently: INJECT 30 UNITS SUBCUTANEOUSLY IN THE MORNING) 4 pen 6  . umeclidinium-vilanterol (ANORO ELLIPTA) 62.5-25 MCG/INH AEPB Inhale 1 puff into the lungs daily. 1 each 12   No current facility-administered medications for this visit.     OBJECTIVE: Vitals:   05/22/16 0911  BP: 120/74  Pulse: 96  Resp: 18  Temp: 99.1 F (37.3 C)     Body mass index is 25.46 kg/m.    ECOG FS:0 - Asymptomatic  General: Well-developed, well-nourished, no acute distress. Eyes: Pink conjunctiva, anicteric sclera. Lungs: Clear to auscultation bilaterally. Heart: Regular rate and rhythm. No rubs, murmurs, or gallops. Abdomen: Soft, nontender, nondistended. No organomegaly noted, normoactive bowel sounds. Musculoskeletal: No edema, cyanosis, or clubbing. Neuro: Alert, answering all questions appropriately. Cranial nerves grossly intact. Skin: No rashes or petechiae noted. Psych: Normal affect.   LAB RESULTS:  Lab Results  Component Value Date   NA 134 (L) 05/22/2016   K 4.6 05/22/2016   CL 101 05/22/2016   CO2 27 05/22/2016   GLUCOSE 170 (H) 05/22/2016   BUN 30 (H) 05/22/2016   CREATININE 1.60 (H) 05/22/2016   CALCIUM 9.9 05/22/2016   PROT 7.9 05/22/2016   ALBUMIN 3.3  (L) 05/22/2016   AST 24 05/22/2016   ALT 22 05/22/2016   ALKPHOS 103 05/22/2016   BILITOT 0.5 05/22/2016   GFRNONAA 44 (L) 05/22/2016   GFRAA 51 (L) 05/22/2016    Lab Results  Component Value Date   WBC 10.1 05/22/2016   NEUTROABS 8.6 (H) 05/22/2016   HGB 10.7 (L) 05/22/2016   HCT 32.2 (L) 05/22/2016   MCV 80.4 05/22/2016   PLT 342 05/22/2016     STUDIES: Dg Chest 1 View  Result Date: 04/28/2016 CLINICAL DATA:  Post bronchoscopy.  Right side mass. EXAM: CHEST 1 VIEW COMPARISON:  CT 04/10/2016 FINDINGS: Large mass is again noted in the right hemithorax, stable. No pneumothorax following bronchoscopy. Elevation of the right hemidiaphragm. Left lung is clear. No effusions. Heart is normal size. IMPRESSION: Large right lung mass.  No pneumothorax. Electronically Signed   By: Rolm Baptise M.D.   On: 04/28/2016 14:53   Mr Jeri Cos ZO Contrast  Result Date:  05/14/2016 CLINICAL DATA:  Lung cancer staging EXAM: MRI HEAD WITHOUT AND WITH CONTRAST TECHNIQUE: Multiplanar, multiecho pulse sequences of the brain and surrounding structures were obtained without and with intravenous contrast. CONTRAST:  76m MULTIHANCE GADOBENATE DIMEGLUMINE 529 MG/ML IV SOLN COMPARISON:  None. FINDINGS: Brain: Ventricle size normal. Lung volume normal. Negative for acute infarct. Mild hyperintensity in the cerebral white matter bilaterally most consistent with mild chronic microvascular ischemia. Negative for intracranial hemorrhage. 2 mm enhancing nodule in the right frontal cortex, suspicious for metastatic disease. No other suspicious enhancing lesions identified. Leptomeningeal enhancement normal. Vascular: Normal arterial flow voids. Normal venous enhancement postcontrast administration. Skull and upper cervical spine: Negative Sinuses/Orbits: Negative Other: None IMPRESSION: 2 mm enhancing nodule in the right frontal cortex over the convexity, suspicious for solitary metastatic disease. Short-term interval MRI  follow-up is suggested. Mild age-related chronic microvascular ischemic changes in the white matter. Electronically Signed   By: CFranchot GalloM.D.   On: 05/14/2016 13:09   Nm Pet Image Initial (pi) Skull Base To Thigh  Result Date: 05/02/2016 CLINICAL DATA:  Initial treatment strategy for lung mass. EXAM: NUCLEAR MEDICINE PET SKULL BASE TO THIGH TECHNIQUE: Twelve point to mCi F-18 FDG was injected intravenously. Full-ring PET imaging was performed from the skull base to thigh after the radiotracer. CT data was obtained and used for attenuation correction and anatomic localization. FASTING BLOOD GLUCOSE:  Value: 136 mg/dl COMPARISON:  Chest CT 04/10/2016 FINDINGS: NECK No hypermetabolic lymph nodes in the neck. Focal area of hypermetabolism is noted anterior to the mandible. No CT correlate is identified. The mandible is intact. This may reflect dental or gum disease. CHEST Large central right upper lobe lung mass with a drowned/obstructed right upper lobe. This is markedly hypermetabolic with SUV max of 178.9 Most of the hypermetabolism is centered in the medial aspect of the right upper lobe along with mediastinal and right hilar adenopathy. No contralateral adenopathy or subcarinal adenopathy. Multiple pulmonary nodules are noted on the chest CT consistent with pulmonary metastatic disease. The largest nodule at the right lung base measures 9 mm and is hypermetabolic with SUV max of 5.0. The other nodules are below the limits of sensitivity for PET imaging. Small right pleural effusion. ABDOMEN/PELVIS No abnormal hypermetabolic activity within the liver, pancreas, adrenal glands, or spleen. No hypermetabolic lymph nodes in the abdomen or pelvis. SKELETON There is a lytic lesion involving the left pubic bone near the pubic symphysis which is hypermetabolic. SUV max is 13.8. Findings consistent with a metastatic lesion. I do not however see any other definite osseous metastasis. IMPRESSION: 1. Large central  right upper lobe lung mass consistent with neoplasm and associated mediastinal and right hilar adenopathy. The right upper lobe has obstructed/drowned. 2. Pulmonary metastatic disease. 3. Single lytic metastatic bone lesion involving the left pubic bone. Electronically Signed   By: PMarijo SanesM.D.   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 Biopsy confirmed squamous cell carcinoma. MRI the brain noted 2 mm focus concerning for metastatic disease, but currently too small to characterize. Initially will treat patient as stage III disease and monitor his isolated metastasis in his left pubic bone. Proceed with cycle 2 of weekly carboplatinum and  Taxol. Continue daily XRT. Return to clinic in 1 week for consideration of cycle 3.  2. Renal insufficiency: Patient's creatinine has slightly trended up, monitor.  3. Brain lesion: 2 mm focus concerning for metastatic disease, but too small to characterize. Consider repeat brain imaging in 3 months or January 2018. 4. Hyperglycemia: Monitor since patient is receiving dexamethasone with his treatments.   Patient expressed understanding and was in agreement with this plan. He also understands that He can call clinic at any time with any questions, concerns, or complaints.   Primary cancer of right upper lobe of lung [facility])   Staging form: Lung, AJCC 7th Edition   - Clinical stage from 05/09/2016: Stage IV (T3, N2, M1b) - Signed by Lloyd Huger, MD on 05/09/2016  Lloyd Huger, MD   05/22/2016 10:00 AM

## 2016-05-22 ENCOUNTER — Ambulatory Visit
Admission: RE | Admit: 2016-05-22 | Discharge: 2016-05-22 | Disposition: A | Discharge status: Home / Self Care | Payer: BLUE CROSS/BLUE SHIELD | Source: Ambulatory Visit | Attending: Radiation Oncology | Admitting: Radiation Oncology

## 2016-05-22 ENCOUNTER — Inpatient Hospital Stay: Payer: BLUE CROSS/BLUE SHIELD

## 2016-05-22 ENCOUNTER — Inpatient Hospital Stay: Payer: BLUE CROSS/BLUE SHIELD | Attending: Oncology | Admitting: Oncology

## 2016-05-22 VITALS — BP 120/74 | HR 96 | Temp 99.1°F | Resp 18 | Wt 157.7 lb

## 2016-05-22 DIAGNOSIS — R944 Abnormal results of kidney function studies: Secondary | ICD-10-CM | POA: Diagnosis not present

## 2016-05-22 DIAGNOSIS — G939 Disorder of brain, unspecified: Secondary | ICD-10-CM | POA: Insufficient documentation

## 2016-05-22 DIAGNOSIS — Z7982 Long term (current) use of aspirin: Secondary | ICD-10-CM | POA: Diagnosis not present

## 2016-05-22 DIAGNOSIS — N189 Chronic kidney disease, unspecified: Secondary | ICD-10-CM | POA: Insufficient documentation

## 2016-05-22 DIAGNOSIS — E1122 Type 2 diabetes mellitus with diabetic chronic kidney disease: Secondary | ICD-10-CM | POA: Diagnosis not present

## 2016-05-22 DIAGNOSIS — F329 Major depressive disorder, single episode, unspecified: Secondary | ICD-10-CM | POA: Diagnosis not present

## 2016-05-22 DIAGNOSIS — Z5111 Encounter for antineoplastic chemotherapy: Secondary | ICD-10-CM | POA: Insufficient documentation

## 2016-05-22 DIAGNOSIS — Z87891 Personal history of nicotine dependence: Secondary | ICD-10-CM | POA: Insufficient documentation

## 2016-05-22 DIAGNOSIS — F419 Anxiety disorder, unspecified: Secondary | ICD-10-CM | POA: Diagnosis not present

## 2016-05-22 DIAGNOSIS — Z79899 Other long term (current) drug therapy: Secondary | ICD-10-CM | POA: Diagnosis not present

## 2016-05-22 DIAGNOSIS — E785 Hyperlipidemia, unspecified: Secondary | ICD-10-CM | POA: Insufficient documentation

## 2016-05-22 DIAGNOSIS — C3411 Malignant neoplasm of upper lobe, right bronchus or lung: Secondary | ICD-10-CM | POA: Insufficient documentation

## 2016-05-22 DIAGNOSIS — I129 Hypertensive chronic kidney disease with stage 1 through stage 4 chronic kidney disease, or unspecified chronic kidney disease: Secondary | ICD-10-CM | POA: Diagnosis not present

## 2016-05-22 DIAGNOSIS — C7951 Secondary malignant neoplasm of bone: Secondary | ICD-10-CM | POA: Insufficient documentation

## 2016-05-22 DIAGNOSIS — J449 Chronic obstructive pulmonary disease, unspecified: Secondary | ICD-10-CM | POA: Insufficient documentation

## 2016-05-22 LAB — CBC WITH DIFFERENTIAL/PLATELET
BASOS ABS: 0 10*3/uL (ref 0–0.1)
Basophils Relative: 0 %
Eosinophils Absolute: 0.1 10*3/uL (ref 0–0.7)
Eosinophils Relative: 1 %
HEMATOCRIT: 32.2 % — AB (ref 40.0–52.0)
Hemoglobin: 10.7 g/dL — ABNORMAL LOW (ref 13.0–18.0)
LYMPHS PCT: 5 %
Lymphs Abs: 0.5 10*3/uL — ABNORMAL LOW (ref 1.0–3.6)
MCH: 26.6 pg (ref 26.0–34.0)
MCHC: 33.1 g/dL (ref 32.0–36.0)
MCV: 80.4 fL (ref 80.0–100.0)
MONO ABS: 0.9 10*3/uL (ref 0.2–1.0)
Monocytes Relative: 8 %
NEUTROS ABS: 8.6 10*3/uL — AB (ref 1.4–6.5)
Neutrophils Relative %: 86 %
Platelets: 342 10*3/uL (ref 150–440)
RBC: 4.01 MIL/uL — AB (ref 4.40–5.90)
RDW: 14.4 % (ref 11.5–14.5)
WBC: 10.1 10*3/uL (ref 3.8–10.6)

## 2016-05-22 LAB — COMPREHENSIVE METABOLIC PANEL
ALK PHOS: 103 U/L (ref 38–126)
ALT: 22 U/L (ref 17–63)
AST: 24 U/L (ref 15–41)
Albumin: 3.3 g/dL — ABNORMAL LOW (ref 3.5–5.0)
Anion gap: 6 (ref 5–15)
BILIRUBIN TOTAL: 0.5 mg/dL (ref 0.3–1.2)
BUN: 30 mg/dL — AB (ref 6–20)
CO2: 27 mmol/L (ref 22–32)
CREATININE: 1.6 mg/dL — AB (ref 0.61–1.24)
Calcium: 9.9 mg/dL (ref 8.9–10.3)
Chloride: 101 mmol/L (ref 101–111)
GFR calc Af Amer: 51 mL/min — ABNORMAL LOW (ref >60)
GFR, EST NON AFRICAN AMERICAN: 44 mL/min — AB (ref >60)
Glucose, Bld: 170 mg/dL — ABNORMAL HIGH (ref 65–99)
Potassium: 4.6 mmol/L (ref 3.5–5.1)
Sodium: 134 mmol/L — ABNORMAL LOW (ref 135–145)
TOTAL PROTEIN: 7.9 g/dL (ref 6.5–8.1)

## 2016-05-22 MED ORDER — SODIUM CHLORIDE 0.9 % IV SOLN
45.0000 mg/m2 | Freq: Once | INTRAVENOUS | Status: AC
  Administered 2016-05-22: 84 mg via INTRAVENOUS
  Filled 2016-05-22: qty 14

## 2016-05-22 MED ORDER — SODIUM CHLORIDE 0.9 % IV SOLN
Freq: Once | INTRAVENOUS | Status: AC
  Administered 2016-05-22: 10:00:00 via INTRAVENOUS
  Filled 2016-05-22: qty 1000

## 2016-05-22 MED ORDER — SODIUM CHLORIDE 0.9 % IV SOLN
144.4000 mg | Freq: Once | INTRAVENOUS | Status: AC
  Administered 2016-05-22: 140 mg via INTRAVENOUS
  Filled 2016-05-22: qty 14

## 2016-05-22 MED ORDER — PALONOSETRON HCL INJECTION 0.25 MG/5ML
0.2500 mg | Freq: Once | INTRAVENOUS | Status: AC
  Administered 2016-05-22: 0.25 mg via INTRAVENOUS
  Filled 2016-05-22: qty 5

## 2016-05-22 MED ORDER — DEXAMETHASONE SODIUM PHOSPHATE 100 MG/10ML IJ SOLN: 10.0000 mg | Freq: Once | INTRAMUSCULAR | Status: DC

## 2016-05-22 MED ORDER — PACLITAXEL CHEMO INJECTION 300 MG/50ML: 45.0000 mg/m2 | Freq: Once | INTRAVENOUS | Status: DC

## 2016-05-22 MED ORDER — FAMOTIDINE IN NACL 20-0.9 MG/50ML-% IV SOLN
20.0000 mg | Freq: Once | INTRAVENOUS | Status: AC
  Administered 2016-05-22: 20 mg via INTRAVENOUS
  Filled 2016-05-22: qty 50

## 2016-05-22 MED ORDER — DIPHENHYDRAMINE HCL 50 MG/ML IJ SOLN
25.0000 mg | Freq: Once | INTRAMUSCULAR | Status: AC
  Administered 2016-05-22: 25 mg via INTRAVENOUS
  Filled 2016-05-22: qty 1

## 2016-05-22 MED ORDER — DEXAMETHASONE SODIUM PHOSPHATE 10 MG/ML IJ SOLN
10.0000 mg | Freq: Once | INTRAMUSCULAR | Status: AC
  Administered 2016-05-22: 10 mg via INTRAVENOUS
  Filled 2016-05-22: qty 1

## 2016-05-22 NOTE — Progress Notes (Signed)
States had mild nausea after last treatment. Nausea resolved. Feeling well today.

## 2016-05-22 NOTE — Progress Notes (Signed)
Creatinine 1.6 today. Per Dr Grayland Ormond may proceed with treatment

## 2016-05-23 ENCOUNTER — Telehealth: Payer: Self-pay | Admitting: *Deleted

## 2016-05-23 ENCOUNTER — Ambulatory Visit
Admission: RE | Admit: 2016-05-23 | Discharge: 2016-05-23 | Disposition: A | Discharge status: Home / Self Care | Payer: BLUE CROSS/BLUE SHIELD | Source: Ambulatory Visit | Attending: Radiation Oncology | Admitting: Radiation Oncology

## 2016-05-23 DIAGNOSIS — C3411 Malignant neoplasm of upper lobe, right bronchus or lung: Secondary | ICD-10-CM | POA: Diagnosis not present

## 2016-05-23 NOTE — Telephone Encounter (Signed)
I spoke with Ralph Lopez and his sister today when he was here for radiation.   He said he was having some hallucinations last night after he got home.  He also became confused about the time of day.  He was alert and oriented today and was able to verbalize that these were hallucinations he was having.  He also said last week after he received some medication prior to chemo he felt very strange.  When he asked what was happening the nurse said she had just given him some benadryl.  He has not had any problems on other days.  He is also taking ativan, tussionex, and oxycodone prn.  Family is wondering if it is related to benadryl or possibly interactions with his medications, but they do feel it is connected to the days he receives chemotherapy.  Ralph Lopez declined seeing the MD covering for Dr. Baruch Gouty today, so he was told if this happens again to give [place] a call immediately.

## 2016-05-23 NOTE — Telephone Encounter (Signed)
Instructed wife per VO Dr Sherrine Maples to hold off his lorazepam, Tussionex and oxycodone ( he may use Oxycodone sparingly if in severe pain) She acknowledged this and reported that he had no Tussionex or Oxycodone yesterday, only the lorazepam when he got home, but she will abide by the orders to hold off medications.

## 2016-05-23 NOTE — Telephone Encounter (Signed)
Called to report that he was having hallucinations when he got home from his chemo treatment See ing stuff not there, states his "nerves were tore up" also He got up at 10 PM thinking it was 10 AM and started making coffee He was angry because she did not agree that pictures were moving and that it was night time not morning.  I called Ana RN in XRT to evaluate patient who is here for treatment this morning and she will contact on call md if necessary.

## 2016-05-24 ENCOUNTER — Other Ambulatory Visit: Payer: Self-pay | Admitting: Family Medicine

## 2016-05-26 ENCOUNTER — Ambulatory Visit
Admission: RE | Admit: 2016-05-26 | Discharge: 2016-05-26 | Disposition: A | Discharge status: Home / Self Care | Payer: BLUE CROSS/BLUE SHIELD | Source: Ambulatory Visit | Attending: Radiation Oncology | Admitting: Radiation Oncology

## 2016-05-26 DIAGNOSIS — C3411 Malignant neoplasm of upper lobe, right bronchus or lung: Secondary | ICD-10-CM | POA: Diagnosis not present

## 2016-05-26 NOTE — Telephone Encounter (Signed)
Routing to provider  

## 2016-05-27 ENCOUNTER — Ambulatory Visit
Admission: RE | Admit: 2016-05-27 | Discharge: 2016-05-27 | Disposition: A | Discharge status: Home / Self Care | Payer: BLUE CROSS/BLUE SHIELD | Source: Ambulatory Visit | Attending: Radiation Oncology | Admitting: Radiation Oncology

## 2016-05-27 DIAGNOSIS — C3411 Malignant neoplasm of upper lobe, right bronchus or lung: Secondary | ICD-10-CM | POA: Diagnosis not present

## 2016-05-28 ENCOUNTER — Ambulatory Visit
Admission: RE | Admit: 2016-05-28 | Discharge: 2016-05-28 | Disposition: A | Discharge status: Home / Self Care | Payer: BLUE CROSS/BLUE SHIELD | Source: Ambulatory Visit | Attending: Radiation Oncology | Admitting: Radiation Oncology

## 2016-05-28 DIAGNOSIS — C3411 Malignant neoplasm of upper lobe, right bronchus or lung: Secondary | ICD-10-CM | POA: Diagnosis not present

## 2016-05-29 ENCOUNTER — Ambulatory Visit
Admission: RE | Admit: 2016-05-29 | Discharge: 2016-05-29 | Disposition: A | Discharge status: Home / Self Care | Payer: BLUE CROSS/BLUE SHIELD | Source: Ambulatory Visit | Attending: Radiation Oncology | Admitting: Radiation Oncology

## 2016-05-29 ENCOUNTER — Inpatient Hospital Stay: Payer: BLUE CROSS/BLUE SHIELD

## 2016-05-29 ENCOUNTER — Inpatient Hospital Stay (HOSPITAL_BASED_OUTPATIENT_CLINIC_OR_DEPARTMENT_OTHER): Payer: BLUE CROSS/BLUE SHIELD | Admitting: Oncology

## 2016-05-29 VITALS — BP 122/82 | HR 93 | Temp 97.6°F | Resp 18 | Wt 156.6 lb

## 2016-05-29 VITALS — BP 132/80 | HR 98 | Resp 18

## 2016-05-29 DIAGNOSIS — R944 Abnormal results of kidney function studies: Secondary | ICD-10-CM | POA: Diagnosis not present

## 2016-05-29 DIAGNOSIS — C7951 Secondary malignant neoplasm of bone: Secondary | ICD-10-CM | POA: Diagnosis not present

## 2016-05-29 DIAGNOSIS — G939 Disorder of brain, unspecified: Secondary | ICD-10-CM | POA: Diagnosis not present

## 2016-05-29 DIAGNOSIS — C3411 Malignant neoplasm of upper lobe, right bronchus or lung: Secondary | ICD-10-CM

## 2016-05-29 DIAGNOSIS — Z79899 Other long term (current) drug therapy: Secondary | ICD-10-CM

## 2016-05-29 LAB — COMPREHENSIVE METABOLIC PANEL
ALT: 53 U/L (ref 17–63)
AST: 48 U/L — AB (ref 15–41)
Albumin: 3.1 g/dL — ABNORMAL LOW (ref 3.5–5.0)
Alkaline Phosphatase: 98 U/L (ref 38–126)
Anion gap: 6 (ref 5–15)
BUN: 19 mg/dL (ref 6–20)
CHLORIDE: 103 mmol/L (ref 101–111)
CO2: 26 mmol/L (ref 22–32)
CREATININE: 1.31 mg/dL — AB (ref 0.61–1.24)
Calcium: 9.9 mg/dL (ref 8.9–10.3)
GFR calc Af Amer: >60 mL/min (ref >60)
GFR calc non Af Amer: 56 mL/min — ABNORMAL LOW (ref >60)
Glucose, Bld: 136 mg/dL — ABNORMAL HIGH (ref 65–99)
Potassium: 4.4 mmol/L (ref 3.5–5.1)
SODIUM: 135 mmol/L (ref 135–145)
Total Bilirubin: 0.4 mg/dL (ref 0.3–1.2)
Total Protein: 7.5 g/dL (ref 6.5–8.1)

## 2016-05-29 LAB — CBC WITH DIFFERENTIAL/PLATELET
BASOS ABS: 0.1 10*3/uL (ref 0–0.1)
Basophils Relative: 1 %
EOS ABS: 0.1 10*3/uL (ref 0–0.7)
EOS PCT: 1 %
HCT: 34 % — ABNORMAL LOW (ref 40.0–52.0)
Hemoglobin: 11.1 g/dL — ABNORMAL LOW (ref 13.0–18.0)
LYMPHS PCT: 7 %
Lymphs Abs: 0.5 10*3/uL — ABNORMAL LOW (ref 1.0–3.6)
MCH: 26.2 pg (ref 26.0–34.0)
MCHC: 32.5 g/dL (ref 32.0–36.0)
MCV: 80.5 fL (ref 80.0–100.0)
Monocytes Absolute: 0.9 10*3/uL (ref 0.2–1.0)
Monocytes Relative: 14 %
Neutro Abs: 5 10*3/uL (ref 1.4–6.5)
Neutrophils Relative %: 77 %
PLATELETS: 363 10*3/uL (ref 150–440)
RBC: 4.22 MIL/uL — AB (ref 4.40–5.90)
RDW: 14.7 % — ABNORMAL HIGH (ref 11.5–14.5)
WBC: 6.5 10*3/uL (ref 3.8–10.6)

## 2016-05-29 MED ORDER — FAMOTIDINE IN NACL 20-0.9 MG/50ML-% IV SOLN
20.0000 mg | Freq: Once | INTRAVENOUS | Status: AC
  Administered 2016-05-29: 20 mg via INTRAVENOUS
  Filled 2016-05-29: qty 50

## 2016-05-29 MED ORDER — CARBOPLATIN CHEMO INJECTION 450 MG/45ML: 165.2000 mg | Freq: Once | INTRAVENOUS | Status: DC

## 2016-05-29 MED ORDER — SODIUM CHLORIDE 0.9 % IV SOLN
Freq: Once | INTRAVENOUS | Status: AC
  Administered 2016-05-29: 12:00:00 via INTRAVENOUS
  Filled 2016-05-29: qty 1000

## 2016-05-29 MED ORDER — SODIUM CHLORIDE 0.9 % IV SOLN
45.0000 mg/m2 | Freq: Once | INTRAVENOUS | Status: AC
  Administered 2016-05-29: 84 mg via INTRAVENOUS
  Filled 2016-05-29: qty 14

## 2016-05-29 MED ORDER — HEPARIN SOD (PORK) LOCK FLUSH 100 UNIT/ML IV SOLN: 500.0000 [IU] | Freq: Once | INTRAVENOUS | Status: DC | PRN

## 2016-05-29 MED ORDER — DEXAMETHASONE SODIUM PHOSPHATE 10 MG/ML IJ SOLN
10.0000 mg | Freq: Once | INTRAMUSCULAR | Status: AC
  Administered 2016-05-29: 10 mg via INTRAVENOUS
  Filled 2016-05-29: qty 1

## 2016-05-29 MED ORDER — PALONOSETRON HCL INJECTION 0.25 MG/5ML
0.2500 mg | Freq: Once | INTRAVENOUS | Status: AC
  Administered 2016-05-29: 0.25 mg via INTRAVENOUS
  Filled 2016-05-29: qty 5

## 2016-05-29 MED ORDER — SODIUM CHLORIDE 0.9 % IV SOLN
165.2000 mg | Freq: Once | INTRAVENOUS | Status: AC
  Administered 2016-05-29: 170 mg via INTRAVENOUS
  Filled 2016-05-29: qty 17

## 2016-05-29 NOTE — Progress Notes (Signed)
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  INTERVAL HISTORY: Patient returns to clinic today for further evaluation and consideration of cycle 3 of weekly carboplatinum and Taxol along with his daily XRT.  He is tolerating his treatments well with only some mild nausea. He currently feels well and is asymptomatic.  He denies any fevers. He has no neurologic complaints. He has a good appetite and denies weight loss. He continues to have a cough, but denies shortness of breath, chest pain, or hemoptysis. He has no vomiting, constipation, or diarrhea. He has no urinary complaints. Patient offers no further specific complaints.  REVIEW OF SYSTEMS:   Review of Systems  Constitutional: Negative.  Negative for fever, malaise/fatigue and weight loss.  Respiratory: Positive for cough. Negative for hemoptysis and shortness of breath.   Cardiovascular: Negative.  Negative for chest pain and leg swelling.  Gastrointestinal: Negative.  Negative for abdominal pain.  Musculoskeletal: Negative.   Neurological: Negative.  Negative for sensory change and weakness.  Psychiatric/Behavioral: Negative.  The patient is not nervous/anxious.    As per HPI. Otherwise, a complete review of systems is negative.   PAST MEDICAL HISTORY: Past Medical History:  Diagnosis Date  . Anxiety   . Chronic kidney disease   . COPD (chronic obstructive pulmonary disease) ([place])   . Depression   . Diabetes mellitus without complication ([place])   . Hyperlipidemia   . Hypertension     PAST SURGICAL HISTORY: Past Surgical History:  Procedure Laterality Date  . EYE SURGERY    . FLEXIBLE BRONCHOSCOPY N/A 04/28/2016   Procedure:  FLEXIBLE BRONCHOSCOPY;  Surgeon: [name], MD;  Location: [facility] ORS;  Service: Pulmonary;  Laterality: N/A;  . MANDIBLE SURGERY      FAMILY HISTORY: Family History  Problem Relation Age of Onset  . Diabetes Mother   . Hypertension Mother   . Breast cancer Mother   . Thrombosis Mother   . Heart disease Father     MI  . Breast cancer Sister   . Colon cancer Brother   . Lung cancer Brother     ADVANCED DIRECTIVES (Y/N):  N  HEALTH MAINTENANCE: Social History  Substance Use Topics  . Smoking status: Former Smoker    Years: 20.00    Quit date: 04/22/2014  . Smokeless tobacco: Never Used  . Alcohol use No     Colonoscopy:  PAP:  Bone density:  Lipid panel:  No Known Allergies  Current Outpatient Prescriptions  Medication Sig Dispense Refill  . albuterol (PROVENTIL HFA;VENTOLIN HFA) 108 (90 Base) MCG/ACT inhaler Inhale 2 puffs into the lungs every 6 (six) hours as needed for wheezing or shortness of breath.    Marland Kitchen aspirin EC 81 MG tablet Take 81 mg by mouth daily.    Marland Kitchen atenolol (TENORMIN) 100 MG tablet Take 100 mg by mouth daily.    Marland Kitchen atorvastatin (LIPITOR) 10 MG tablet Take 10 mg by mouth daily.    . Exenatide ER (BYDUREON) 2 MG PEN Inject 2 mg into the skin once a week.    Marland Kitchen lisinopril (PRINIVIL,ZESTRIL) 40 MG tablet Take 40 mg by mouth daily.    Marland Kitchen LORazepam (ATIVAN) 0.5 MG tablet Take 1 tablet (0.5 mg total)  by mouth 2 (two) times daily. 60 tablet 0  . ondansetron (ZOFRAN) 8 MG tablet Take 1 tablet (8 mg total) by mouth 2 (two) times daily as needed for refractory nausea / vomiting. 30 tablet 1  . pantoprazole (PROTONIX) 40 MG tablet Take 1 tablet (40 mg total) by mouth daily. 90 tablet 3  . prochlorperazine (COMPAZINE) 10 MG tablet Take 1 tablet (10 mg total) by mouth every 6 (six) hours as needed (Nausea or vomiting). 30 tablet 1  . ranitidine (ZANTAC) 150 MG tablet Take 150 mg by mouth daily as needed for heartburn.     . umeclidinium-vilanterol (ANORO ELLIPTA)  62.5-25 MCG/INH AEPB Inhale 1 puff into the lungs daily. 1 each 12  . atenolol (TENORMIN) 100 MG tablet TAKE ONE TABLET BY MOUTH ONCE DAILY 90 tablet 0  . BYDUREON 2 MG PEN INJECT '2MG'$  SUBCUTANEOUSLY WEEKLY 4 each 0  . TOUJEO SOLOSTAR 300 UNIT/ML SOPN INJECT DAILY AS DIRECTED 4 pen 6   No current facility-administered medications for this visit.     OBJECTIVE: Vitals:   05/29/16 1040  BP: 122/82  Pulse: 93  Resp: 18  Temp: 97.6 F (36.4 C)     Body mass index is 25.28 kg/m.    ECOG FS:0 - Asymptomatic  General: Well-developed, well-nourished, no acute distress. Eyes: Pink conjunctiva, anicteric sclera. Lungs: Clear to auscultation bilaterally. Heart: Regular rate and rhythm. No rubs, murmurs, or gallops. Abdomen: Soft, nontender, nondistended. No organomegaly noted, normoactive bowel sounds. Musculoskeletal: No edema, cyanosis, or clubbing. Neuro: Alert, answering all questions appropriately. Cranial nerves grossly intact. Skin: No rashes or petechiae noted. Psych: Normal affect.   LAB RESULTS:  Lab Results  Component Value Date   NA 135 05/29/2016   K 4.4 05/29/2016   CL 103 05/29/2016   CO2 26 05/29/2016   GLUCOSE 136 (H) 05/29/2016   BUN 19 05/29/2016   CREATININE 1.31 (H) 05/29/2016   CALCIUM 9.9 05/29/2016   PROT 7.5 05/29/2016   ALBUMIN 3.1 (L) 05/29/2016   AST 48 (H) 05/29/2016   ALT 53 05/29/2016   ALKPHOS 98 05/29/2016   BILITOT 0.4 05/29/2016   GFRNONAA 56 (L) 05/29/2016   GFRAA >60 05/29/2016    Lab Results  Component Value Date   WBC 6.5 05/29/2016   NEUTROABS 5.0 05/29/2016   HGB 11.1 (L) 05/29/2016   HCT 34.0 (L) 05/29/2016   MCV 80.5 05/29/2016   PLT 363 05/29/2016     STUDIES: Mr Jeri Cos ZJ Contrast  Result Date: 05/14/2016 CLINICAL DATA:  Lung cancer staging EXAM: MRI HEAD WITHOUT AND WITH CONTRAST TECHNIQUE: Multiplanar, multiecho pulse sequences of the brain and surrounding structures were obtained without and with intravenous  contrast. CONTRAST:  37m MULTIHANCE GADOBENATE DIMEGLUMINE 529 MG/ML IV SOLN COMPARISON:  None. FINDINGS: Brain: Ventricle size normal. Lung volume normal. Negative for acute infarct. Mild hyperintensity in the cerebral white matter bilaterally most consistent with mild chronic microvascular ischemia. Negative for intracranial hemorrhage. 2 mm enhancing nodule in the right frontal cortex, suspicious for metastatic disease. No other suspicious enhancing lesions identified. Leptomeningeal enhancement normal. Vascular: Normal arterial flow voids. Normal venous enhancement postcontrast administration. Skull and upper cervical spine: Negative Sinuses/Orbits: Negative Other: None IMPRESSION: 2 mm enhancing nodule in the right frontal cortex over the convexity, suspicious for solitary metastatic disease. Short-term interval MRI follow-up is suggested. Mild age-related chronic microvascular ischemic changes in the white matter. Electronically Signed   By: CFranchot GalloM.D.   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 Biopsy confirmed squamous cell carcinoma. MRI the brain noted 2 mm focus concerning for metastatic disease, but currently too small to characterize. Initially will treat patient as stage III disease and monitor his isolated metastasis in his left pubic bone. Proceed with cycle 3 of weekly carboplatinum and Taxol. Continue daily XRT. Return to clinic in 1 week for consideration of cycle 4.  2. Renal insufficiency: Patient's creatinine is approximately his baseline. Monitor.  3. Brain lesion: 2 mm focus concerning for metastatic disease, but too small to characterize. Consider repeat brain imaging in 3 months or January 2018. 4.  Hyperglycemia: Improved. Monitor since patient is receiving dexamethasone with his treatments.   Patient expressed understanding and was in agreement with this plan. He also understands that He can call clinic at any time with any questions, concerns, or complaints.   Primary cancer of right upper lobe of lung [facility])   Staging form: Lung, AJCC 7th Edition   - Clinical stage from 05/09/2016: Stage IV (T3, N2, M1b) - Signed by Lloyd Huger, MD on 05/09/2016  Lloyd Huger, MD   06/01/2016 11:07 PM

## 2016-05-29 NOTE — Progress Notes (Signed)
Pt states thinks has allergy to benadryl. After last infusion when benadryl was given, pt experienced hallucinations. Pt requests that benadryl not be given prior to treatment today.

## 2016-05-30 ENCOUNTER — Ambulatory Visit: Payer: BLUE CROSS/BLUE SHIELD

## 2016-05-30 ENCOUNTER — Other Ambulatory Visit: Payer: Self-pay | Admitting: Unknown Physician Specialty

## 2016-05-30 ENCOUNTER — Other Ambulatory Visit: Payer: Self-pay | Admitting: Family Medicine

## 2016-05-30 NOTE — Telephone Encounter (Signed)
Routing to provider, has appt on 07/07/16

## 2016-06-02 ENCOUNTER — Ambulatory Visit
Admission: RE | Admit: 2016-06-02 | Discharge: 2016-06-02 | Disposition: A | Discharge status: Home / Self Care | Payer: BLUE CROSS/BLUE SHIELD | Source: Ambulatory Visit | Attending: Radiation Oncology | Admitting: Radiation Oncology

## 2016-06-02 ENCOUNTER — Telehealth: Payer: Self-pay

## 2016-06-02 DIAGNOSIS — C3411 Malignant neoplasm of upper lobe, right bronchus or lung: Secondary | ICD-10-CM | POA: Diagnosis not present

## 2016-06-02 MED ORDER — INSULIN GLARGINE 300 UNIT/ML ~~LOC~~ SOPN: 30.0000 [IU] | PEN_INJECTOR | Freq: Every day | SUBCUTANEOUS | 6 refills | Status: AC

## 2016-06-02 NOTE — Telephone Encounter (Signed)
Pharmacy sent a fax wanting clarification on patient's Toujeo rx, they need to know how many units the patient is injecting. Last OV note states 30 units and to continue this present dose.

## 2016-06-02 NOTE — Telephone Encounter (Signed)
OK 

## 2016-06-03 ENCOUNTER — Ambulatory Visit
Admission: RE | Admit: 2016-06-03 | Discharge: 2016-06-03 | Disposition: A | Discharge status: Home / Self Care | Payer: BLUE CROSS/BLUE SHIELD | Source: Ambulatory Visit | Attending: Radiation Oncology | Admitting: Radiation Oncology

## 2016-06-03 DIAGNOSIS — C3411 Malignant neoplasm of upper lobe, right bronchus or lung: Secondary | ICD-10-CM | POA: Diagnosis not present

## 2016-06-03 NOTE — Progress Notes (Signed)
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  INTERVAL HISTORY: Patient returns to clinic today for further evaluation and consideration of cycle 4 of weekly carboplatinum and Taxol along with his daily XRT.  He is tolerating his treatments well with only some mild nausea. He currently feels well and is asymptomatic.  He denies any fevers. He has no neurologic complaints. He has a good appetite and denies weight loss. He continues to have a cough, but denies shortness of breath, chest pain, or hemoptysis. He has no vomiting, constipation, or diarrhea. He has no urinary complaints. Patient offers no further specific complaints.  REVIEW OF SYSTEMS:   Review of Systems  Constitutional: Negative.  Negative for fever, malaise/fatigue and weight loss.  Respiratory: Positive for cough. Negative for hemoptysis and shortness of breath.   Cardiovascular: Negative.  Negative for chest pain and leg swelling.  Gastrointestinal: Negative.  Negative for abdominal pain.  Musculoskeletal: Negative.   Neurological: Negative.  Negative for sensory change and weakness.  Psychiatric/Behavioral: Negative.  The patient is not nervous/anxious.    As per HPI. Otherwise, a complete review of systems is negative.   PAST MEDICAL HISTORY: Past Medical History:  Diagnosis Date  . Anxiety   . Chronic kidney disease   . COPD (chronic obstructive pulmonary disease) ([place])   . Depression   . Diabetes mellitus without complication ([place])   . Hyperlipidemia   . Hypertension     PAST SURGICAL HISTORY: Past Surgical History:  Procedure Laterality Date  . EYE SURGERY    . FLEXIBLE BRONCHOSCOPY N/A 04/28/2016   Procedure:  FLEXIBLE BRONCHOSCOPY;  Surgeon: Ralph Mcardle, MD;  Location: [facility] ORS;  Service: Pulmonary;  Laterality: N/A;  . MANDIBLE SURGERY      FAMILY HISTORY: Family History  Problem Relation Age of Onset  . Diabetes Mother   . Hypertension Mother   . Breast cancer Mother   . Thrombosis Mother   . Heart disease Father     MI  . Breast cancer Sister   . Colon cancer Brother   . Lung cancer Brother     ADVANCED DIRECTIVES (Y/N):  N  HEALTH MAINTENANCE: Social History  Substance Use Topics  . Smoking status: Former Smoker    Years: 20.00    Quit date: 04/22/2014  . Smokeless tobacco: Never Used  . Alcohol use No     Colonoscopy:  PAP:  Bone density:  Lipid panel:  No Known Allergies  Current Outpatient Prescriptions  Medication Sig Dispense Refill  . albuterol (PROVENTIL HFA;VENTOLIN HFA) 108 (90 Base) MCG/ACT inhaler Inhale 2 puffs into the lungs every 6 (six) hours as needed for wheezing or shortness of breath.    Marland Kitchen aspirin EC 81 MG tablet Take 81 mg by mouth daily.    Marland Kitchen atenolol (TENORMIN) 100 MG tablet TAKE ONE TABLET BY MOUTH ONCE DAILY 90 tablet 0  . atorvastatin (LIPITOR) 10 MG tablet Take 10 mg by mouth daily.    Marland Kitchen BYDUREON 2 MG PEN INJECT '2MG'$  SUBCUTANEOUSLY WEEKLY 4 each 0  . Exenatide ER (BYDUREON) 2 MG PEN Inject 2 mg into the skin once a week.    . Insulin Glargine (TOUJEO SOLOSTAR) 300 UNIT/ML SOPN Inject 30 Units as  directed daily. 4 pen 6  . LORazepam (ATIVAN) 0.5 MG tablet Take 1 tablet (0.5 mg total) by mouth 2 (two) times daily. 60 tablet 0  . ondansetron (ZOFRAN) 8 MG tablet Take 1 tablet (8 mg total) by mouth 2 (two) times daily as needed for refractory nausea / vomiting. 30 tablet 1  . pantoprazole (PROTONIX) 40 MG tablet Take 1 tablet (40 mg total) by mouth daily. 90 tablet 3  . prochlorperazine (COMPAZINE) 10 MG tablet Take 1 tablet (10 mg total) by mouth every 6 (six) hours as needed (Nausea or vomiting). 30 tablet 1  . ranitidine (ZANTAC) 150 MG  tablet Take 150 mg by mouth daily as needed for heartburn.     . umeclidinium-vilanterol (ANORO ELLIPTA) 62.5-25 MCG/INH AEPB Inhale 1 puff into the lungs daily. 1 each 12   No current facility-administered medications for this visit.     OBJECTIVE: Vitals:   06/05/16 1122  BP: 97/62  Pulse: 96  Resp: 18  Temp: 97.7 F (36.5 C)     Body mass index is 25.39 kg/m.    ECOG FS:0 - Asymptomatic  General: Well-developed, well-nourished, no acute distress. Eyes: Pink conjunctiva, anicteric sclera. Lungs: Clear to auscultation bilaterally. Heart: Regular rate and rhythm. No rubs, murmurs, or gallops. Abdomen: Soft, nontender, nondistended. No organomegaly noted, normoactive bowel sounds. Musculoskeletal: No edema, cyanosis, or clubbing. Neuro: Alert, answering all questions appropriately. Cranial nerves grossly intact. Skin: No rashes or petechiae noted. Psych: Normal affect.   LAB RESULTS:  Lab Results  Component Value Date   NA 135 06/05/2016   K 5.3 (H) 06/05/2016   CL 102 06/05/2016   CO2 26 06/05/2016   GLUCOSE 239 (H) 06/05/2016   BUN 21 (H) 06/05/2016   CREATININE 1.45 (H) 06/05/2016   CALCIUM 9.6 06/05/2016   PROT 7.0 06/05/2016   ALBUMIN 3.1 (L) 06/05/2016   AST 19 06/05/2016   ALT 25 06/05/2016   ALKPHOS 67 06/05/2016   BILITOT 0.2 (L) 06/05/2016   GFRNONAA 49 (L) 06/05/2016   GFRAA 57 (L) 06/05/2016    Lab Results  Component Value Date   WBC 5.6 06/05/2016   NEUTROABS 4.7 06/05/2016   HGB 10.1 (L) 06/05/2016   HCT 31.3 (L) 06/05/2016   MCV 81.0 06/05/2016   PLT 301 06/05/2016     STUDIES: Mr Ralph Lopez TF Contrast  Result Date: 05/14/2016 CLINICAL DATA:  Lung cancer staging EXAM: MRI HEAD WITHOUT AND WITH CONTRAST TECHNIQUE: Multiplanar, multiecho pulse sequences of the brain and surrounding structures were obtained without and with intravenous contrast. CONTRAST:  105m MULTIHANCE GADOBENATE DIMEGLUMINE 529 MG/ML IV SOLN COMPARISON:  None. FINDINGS:  Brain: Ventricle size normal. Lung volume normal. Negative for acute infarct. Mild hyperintensity in the cerebral white matter bilaterally most consistent with mild chronic microvascular ischemia. Negative for intracranial hemorrhage. 2 mm enhancing nodule in the right frontal cortex, suspicious for metastatic disease. No other suspicious enhancing lesions identified. Leptomeningeal enhancement normal. Vascular: Normal arterial flow voids. Normal venous enhancement postcontrast administration. Skull and upper cervical spine: Negative Sinuses/Orbits: Negative Other: None IMPRESSION: 2 mm enhancing nodule in the right frontal cortex over the convexity, suspicious for solitary metastatic disease. Short-term interval MRI follow-up is suggested. Mild age-related chronic microvascular ischemic changes in the white matter. Electronically Signed   By: CFranchot GalloM.D.   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 Biopsy confirmed squamous cell carcinoma. MRI the brain noted 2 mm focus concerning for metastatic disease, but currently too small to characterize. Initially will treat patient as stage III disease and monitor his isolated metastasis in his left pubic bone. Proceed with cycle 4 of weekly carboplatinum and Taxol. Continue daily XRT completing on June 16, 2016, patient will also likely get a boost XRT treatment in several weeks. Return to clinic in 2 weeks for further evaluation and consideration of cycle 5.  2. Renal insufficiency: Patient's creatinine is approximately his baseline. Monitor.  3. Brain lesion: 2 mm focus concerning for metastatic disease, but too small to characterize. Consider repeat brain imaging in 3 months or  January 2018. 4. Hyperglycemia: Blood glucose significantly elevated today. Monitor since patient is receiving dexamethasone with his treatments.  5. Hypotension: Patient is asymptomatic, monitor.  Patient expressed understanding and was in agreement with this plan. He also understands that He can call clinic at any time with any questions, concerns, or complaints.   Primary cancer of right upper lobe of lung [facility])   Staging form: Lung, AJCC 7th Edition   - Clinical stage from 05/09/2016: Stage IV (T3, N2, M1b) - Signed by Lloyd Huger, MD on 05/09/2016  Lloyd Huger, MD   06/05/2016 11:47 AM

## 2016-06-04 ENCOUNTER — Ambulatory Visit
Admission: RE | Admit: 2016-06-04 | Discharge: 2016-06-04 | Disposition: A | Discharge status: Home / Self Care | Payer: BLUE CROSS/BLUE SHIELD | Source: Ambulatory Visit | Attending: Radiation Oncology | Admitting: Radiation Oncology

## 2016-06-04 DIAGNOSIS — C3411 Malignant neoplasm of upper lobe, right bronchus or lung: Secondary | ICD-10-CM | POA: Diagnosis not present

## 2016-06-05 ENCOUNTER — Inpatient Hospital Stay: Payer: BLUE CROSS/BLUE SHIELD

## 2016-06-05 ENCOUNTER — Ambulatory Visit
Admission: RE | Admit: 2016-06-05 | Discharge: 2016-06-05 | Disposition: A | Discharge status: Home / Self Care | Payer: BLUE CROSS/BLUE SHIELD | Source: Ambulatory Visit | Attending: Radiation Oncology | Admitting: Radiation Oncology

## 2016-06-05 ENCOUNTER — Inpatient Hospital Stay (HOSPITAL_BASED_OUTPATIENT_CLINIC_OR_DEPARTMENT_OTHER): Payer: BLUE CROSS/BLUE SHIELD | Admitting: Oncology

## 2016-06-05 VITALS — BP 97/62 | HR 96 | Temp 97.7°F | Resp 18 | Wt 157.3 lb

## 2016-06-05 DIAGNOSIS — C7951 Secondary malignant neoplasm of bone: Secondary | ICD-10-CM

## 2016-06-05 DIAGNOSIS — C3411 Malignant neoplasm of upper lobe, right bronchus or lung: Secondary | ICD-10-CM

## 2016-06-05 DIAGNOSIS — R944 Abnormal results of kidney function studies: Secondary | ICD-10-CM

## 2016-06-05 DIAGNOSIS — Z79899 Other long term (current) drug therapy: Secondary | ICD-10-CM

## 2016-06-05 DIAGNOSIS — G939 Disorder of brain, unspecified: Secondary | ICD-10-CM | POA: Diagnosis not present

## 2016-06-05 LAB — COMPREHENSIVE METABOLIC PANEL
ALK PHOS: 67 U/L (ref 38–126)
ALT: 25 U/L (ref 17–63)
ANION GAP: 7 (ref 5–15)
AST: 19 U/L (ref 15–41)
Albumin: 3.1 g/dL — ABNORMAL LOW (ref 3.5–5.0)
BILIRUBIN TOTAL: 0.2 mg/dL — AB (ref 0.3–1.2)
BUN: 21 mg/dL — AB (ref 6–20)
CALCIUM: 9.6 mg/dL (ref 8.9–10.3)
CO2: 26 mmol/L (ref 22–32)
Chloride: 102 mmol/L (ref 101–111)
Creatinine, Ser: 1.45 mg/dL — ABNORMAL HIGH (ref 0.61–1.24)
GFR calc Af Amer: 57 mL/min — ABNORMAL LOW (ref >60)
GFR, EST NON AFRICAN AMERICAN: 49 mL/min — AB (ref >60)
Glucose, Bld: 239 mg/dL — ABNORMAL HIGH (ref 65–99)
POTASSIUM: 5.3 mmol/L — AB (ref 3.5–5.1)
Sodium: 135 mmol/L (ref 135–145)
TOTAL PROTEIN: 7 g/dL (ref 6.5–8.1)

## 2016-06-05 LAB — CBC WITH DIFFERENTIAL/PLATELET
Basophils Absolute: 0 10*3/uL (ref 0–0.1)
Basophils Relative: 1 %
Eosinophils Absolute: 0 10*3/uL (ref 0–0.7)
Eosinophils Relative: 0 %
HEMATOCRIT: 31.3 % — AB (ref 40.0–52.0)
Hemoglobin: 10.1 g/dL — ABNORMAL LOW (ref 13.0–18.0)
LYMPHS ABS: 0.4 10*3/uL — AB (ref 1.0–3.6)
LYMPHS PCT: 7 %
MCH: 26.1 pg (ref 26.0–34.0)
MCHC: 32.2 g/dL (ref 32.0–36.0)
MCV: 81 fL (ref 80.0–100.0)
MONO ABS: 0.5 10*3/uL (ref 0.2–1.0)
MONOS PCT: 9 %
NEUTROS ABS: 4.7 10*3/uL (ref 1.4–6.5)
Neutrophils Relative %: 83 %
Platelets: 301 10*3/uL (ref 150–440)
RBC: 3.86 MIL/uL — ABNORMAL LOW (ref 4.40–5.90)
RDW: 14.8 % — AB (ref 11.5–14.5)
WBC: 5.6 10*3/uL (ref 3.8–10.6)

## 2016-06-05 MED ORDER — SODIUM CHLORIDE 0.9 % IV SOLN
140.0000 mg | Freq: Once | INTRAVENOUS | Status: AC
  Administered 2016-06-05: 140 mg via INTRAVENOUS
  Filled 2016-06-05: qty 14

## 2016-06-05 MED ORDER — ZOLPIDEM TARTRATE 5 MG PO TABS: 5.0000 mg | ORAL_TABLET | Freq: Every evening | ORAL | 1 refills | Status: DC | PRN

## 2016-06-05 MED ORDER — PALONOSETRON HCL INJECTION 0.25 MG/5ML
0.2500 mg | Freq: Once | INTRAVENOUS | Status: AC
  Administered 2016-06-05: 0.25 mg via INTRAVENOUS
  Filled 2016-06-05: qty 5

## 2016-06-05 MED ORDER — DEXAMETHASONE SODIUM PHOSPHATE 10 MG/ML IJ SOLN
10.0000 mg | Freq: Once | INTRAMUSCULAR | Status: AC
  Administered 2016-06-05: 10 mg via INTRAVENOUS
  Filled 2016-06-05: qty 1

## 2016-06-05 MED ORDER — SODIUM CHLORIDE 0.9 % IV SOLN
Freq: Once | INTRAVENOUS | Status: AC
  Administered 2016-06-05: 13:00:00 via INTRAVENOUS
  Filled 2016-06-05: qty 1000

## 2016-06-05 MED ORDER — FAMOTIDINE IN NACL 20-0.9 MG/50ML-% IV SOLN
20.0000 mg | Freq: Once | INTRAVENOUS | Status: AC
  Administered 2016-06-05: 20 mg via INTRAVENOUS
  Filled 2016-06-05: qty 50

## 2016-06-05 MED ORDER — PACLITAXEL CHEMO INJECTION 300 MG/50ML
45.0000 mg/m2 | Freq: Once | INTRAVENOUS | Status: AC
  Administered 2016-06-05: 84 mg via INTRAVENOUS
  Filled 2016-06-05: qty 14

## 2016-06-05 NOTE — Progress Notes (Signed)
Patient states that he has no new concerns today.  Patient states that he does have constipation

## 2016-06-06 ENCOUNTER — Ambulatory Visit
Admission: RE | Admit: 2016-06-06 | Discharge: 2016-06-06 | Disposition: A | Discharge status: Home / Self Care | Payer: BLUE CROSS/BLUE SHIELD | Source: Ambulatory Visit | Attending: Radiation Oncology | Admitting: Radiation Oncology

## 2016-06-06 DIAGNOSIS — C3411 Malignant neoplasm of upper lobe, right bronchus or lung: Secondary | ICD-10-CM | POA: Diagnosis not present

## 2016-06-09 ENCOUNTER — Ambulatory Visit
Admission: RE | Admit: 2016-06-09 | Discharge: 2016-06-09 | Disposition: A | Discharge status: Home / Self Care | Payer: BLUE CROSS/BLUE SHIELD | Source: Ambulatory Visit | Attending: Radiation Oncology | Admitting: Radiation Oncology

## 2016-06-09 DIAGNOSIS — C3411 Malignant neoplasm of upper lobe, right bronchus or lung: Secondary | ICD-10-CM | POA: Diagnosis not present

## 2016-06-10 ENCOUNTER — Ambulatory Visit
Admission: RE | Admit: 2016-06-10 | Discharge: 2016-06-10 | Disposition: A | Discharge status: Home / Self Care | Payer: BLUE CROSS/BLUE SHIELD | Source: Ambulatory Visit | Attending: Radiation Oncology | Admitting: Radiation Oncology

## 2016-06-10 DIAGNOSIS — C3411 Malignant neoplasm of upper lobe, right bronchus or lung: Secondary | ICD-10-CM | POA: Diagnosis not present

## 2016-06-11 ENCOUNTER — Ambulatory Visit
Admission: RE | Admit: 2016-06-11 | Discharge: 2016-06-11 | Disposition: A | Discharge status: Home / Self Care | Payer: BLUE CROSS/BLUE SHIELD | Source: Ambulatory Visit | Attending: Radiation Oncology | Admitting: Radiation Oncology

## 2016-06-11 ENCOUNTER — Ambulatory Visit: Payer: BLUE CROSS/BLUE SHIELD

## 2016-06-11 DIAGNOSIS — C3411 Malignant neoplasm of upper lobe, right bronchus or lung: Secondary | ICD-10-CM | POA: Diagnosis not present

## 2016-06-16 ENCOUNTER — Ambulatory Visit: Payer: BLUE CROSS/BLUE SHIELD

## 2016-06-16 ENCOUNTER — Ambulatory Visit
Admission: RE | Admit: 2016-06-16 | Discharge: 2016-06-16 | Disposition: A | Discharge status: Home / Self Care | Payer: BLUE CROSS/BLUE SHIELD | Source: Ambulatory Visit | Attending: Radiation Oncology | Admitting: Radiation Oncology

## 2016-06-16 DIAGNOSIS — C3411 Malignant neoplasm of upper lobe, right bronchus or lung: Secondary | ICD-10-CM | POA: Diagnosis not present

## 2016-06-17 ENCOUNTER — Ambulatory Visit: Payer: BLUE CROSS/BLUE SHIELD

## 2016-06-18 ENCOUNTER — Ambulatory Visit: Payer: BLUE CROSS/BLUE SHIELD

## 2016-06-18 NOTE — Progress Notes (Signed)
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  INTERVAL HISTORY: Patient returns to clinic today for further evaluation. He currently is on a break from XRT and we will reinitiate with a boost in approximately 2 weeks. He continues to tolerate his treatments well with only some mild nausea. He currently feels well and is asymptomatic.  He denies any fevers. He has no neurologic complaints. He has a good appetite and denies weight loss. He continues to have a cough, but denies shortness of breath, chest pain, or hemoptysis. He has no vomiting, constipation, or diarrhea. He has no urinary complaints. Patient offers no further specific complaints.  REVIEW OF SYSTEMS:   Review of Systems  Constitutional: Negative.  Negative for fever, malaise/fatigue and weight loss.  Respiratory: Positive for cough. Negative for hemoptysis and shortness of breath.   Cardiovascular: Negative.  Negative for chest pain and leg swelling.  Gastrointestinal: Negative.  Negative for abdominal pain.  Musculoskeletal: Negative.   Neurological: Negative.  Negative for sensory change and weakness.  Psychiatric/Behavioral: Negative.  The patient is not nervous/anxious.    As per HPI. Otherwise, a complete review of systems is negative.   PAST MEDICAL HISTORY: Past Medical History:  Diagnosis Date  . Anxiety   . Chronic kidney disease   . COPD (chronic obstructive pulmonary disease) ([place])   . Depression   . Diabetes mellitus without complication ([place])   . Hyperlipidemia   . Hypertension     PAST SURGICAL HISTORY: Past Surgical History:  Procedure Laterality Date  . EYE SURGERY    . FLEXIBLE BRONCHOSCOPY N/A 04/28/2016     Procedure: FLEXIBLE BRONCHOSCOPY;  Surgeon: [name], MD;  Location: [facility] ORS;  Service: Pulmonary;  Laterality: N/A;  . MANDIBLE SURGERY      FAMILY HISTORY: Family History  Problem Relation Age of Onset  . Diabetes Mother   . Hypertension Mother   . Breast cancer Mother   . Thrombosis Mother   . Heart disease Father     MI  . Breast cancer Sister   . Colon cancer Brother   . Lung cancer Brother     ADVANCED DIRECTIVES (Y/N):  N  HEALTH MAINTENANCE: Social History  Substance Use Topics  . Smoking status: Former Smoker    Years: 20.00    Quit date: 04/22/2014  . Smokeless tobacco: Never Used  . Alcohol use No     Colonoscopy:  PAP:  Bone density:  Lipid panel:  No Known Allergies  Current Outpatient Prescriptions  Medication Sig Dispense Refill  . albuterol (PROVENTIL HFA;VENTOLIN HFA) 108 (90 Base) MCG/ACT inhaler Inhale 2 puffs into the lungs every 6 (six) hours as needed for wheezing or shortness of breath.    Marland Kitchen aspirin EC 81 MG tablet Take 81 mg by mouth daily.    Marland Kitchen atenolol (TENORMIN) 100 MG tablet TAKE ONE TABLET BY MOUTH ONCE DAILY 90 tablet 0  . atorvastatin (LIPITOR) 10 MG tablet Take 10 mg by mouth daily.    Marland Kitchen BYDUREON 2 MG PEN INJECT '2MG'$  SUBCUTANEOUSLY WEEKLY 4 each 0  . Exenatide ER (BYDUREON) 2 MG PEN Inject 2 mg into the skin once a week.    . Insulin Glargine (TOUJEO SOLOSTAR) 300 UNIT/ML  SOPN Inject 30 Units as directed daily. 4 pen 6  . LORazepam (ATIVAN) 0.5 MG tablet Take 1 tablet (0.5 mg total) by mouth 2 (two) times daily. 60 tablet 0  . ondansetron (ZOFRAN) 8 MG tablet Take 1 tablet (8 mg total) by mouth 2 (two) times daily as needed for refractory nausea / vomiting. 30 tablet 1  . pantoprazole (PROTONIX) 40 MG tablet Take 1 tablet (40 mg total) by mouth daily. 90 tablet 3  . prochlorperazine (COMPAZINE) 10 MG tablet Take 1 tablet (10 mg total) by mouth every 6 (six) hours as needed (Nausea or vomiting). 30 tablet 1  . ranitidine  (ZANTAC) 150 MG tablet Take 150 mg by mouth daily as needed for heartburn.     . umeclidinium-vilanterol (ANORO ELLIPTA) 62.5-25 MCG/INH AEPB Inhale 1 puff into the lungs daily. 1 each 12  . zolpidem (AMBIEN) 5 MG tablet Take 1 tablet (5 mg total) by mouth at bedtime as needed for sleep. 30 tablet 1   No current facility-administered medications for this visit.     OBJECTIVE: Vitals:   06/19/16 0910  BP: 114/75  Pulse: 86  Resp: 18  Temp: 97.1 F (36.2 C)     Body mass index is 25.98 kg/m.    ECOG FS:0 - Asymptomatic  General: Well-developed, well-nourished, no acute distress. Eyes: Pink conjunctiva, anicteric sclera. Lungs: Clear to auscultation bilaterally. Heart: Regular rate and rhythm. No rubs, murmurs, or gallops. Abdomen: Soft, nontender, nondistended. No organomegaly noted, normoactive bowel sounds. Musculoskeletal: No edema, cyanosis, or clubbing. Neuro: Alert, answering all questions appropriately. Cranial nerves grossly intact. Skin: No rashes or petechiae noted. Psych: Normal affect.   LAB RESULTS:  Lab Results  Component Value Date   NA 137 06/19/2016   K 4.5 06/19/2016   CL 103 06/19/2016   CO2 29 06/19/2016   GLUCOSE 160 (H) 06/19/2016   BUN 23 (H) 06/19/2016   CREATININE 1.41 (H) 06/19/2016   CALCIUM 9.3 06/19/2016   PROT 7.0 06/19/2016   ALBUMIN 3.4 (L) 06/19/2016   AST 16 06/19/2016   ALT 14 (L) 06/19/2016   ALKPHOS 72 06/19/2016   BILITOT 0.4 06/19/2016   GFRNONAA 51 (L) 06/19/2016   GFRAA 59 (L) 06/19/2016    Lab Results  Component Value Date   WBC 4.4 06/19/2016   NEUTROABS 3.2 06/19/2016   HGB 10.6 (L) 06/19/2016   HCT 32.3 (L) 06/19/2016   MCV 82.1 06/19/2016   PLT 210 06/19/2016     STUDIES: No results found.  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 Biopsy confirmed squamous cell carcinoma. MRI the brain noted 2 mm focus concerning for metastatic disease, but currently too small to characterize. Initially will treat patient as stage III disease and monitor his isolated metastasis in his left pubic bone. Because patient is currently on break prior to his boost XRT, will also delay chemotherapy. Return to clinic in 2 weeks to reinitiate XRT as well as cycle 5 of carboplatinum and Taxol.  2. Renal insufficiency: Patient's creatinine is approximately his baseline. Monitor.  3. Brain lesion: 2 mm focus concerning for metastatic disease, but too small to characterize. Consider repeat brain imaging in 3 months in January 2018. 4. Hyperglycemia: Blood glucose elevated today.  Monitor since patient is receiving dexamethasone with his treatments.  5. Hypotension: Blood pressure is now within normal limits.  Patient expressed understanding and was in agreement with this plan. He also understands that He can call clinic at any time with any questions, concerns, or complaints.   Primary cancer of right upper lobe of lung [facility])   Staging form: Lung, AJCC 7th Edition   - Clinical stage from 05/09/2016: Stage IV (T3, N2, M1b) - Signed by Lloyd Huger, MD on 05/09/2016    Lloyd Huger, MD   06/22/2016 10:33 PM

## 2016-06-19 ENCOUNTER — Inpatient Hospital Stay: Payer: BLUE CROSS/BLUE SHIELD

## 2016-06-19 ENCOUNTER — Inpatient Hospital Stay (HOSPITAL_BASED_OUTPATIENT_CLINIC_OR_DEPARTMENT_OTHER): Payer: BLUE CROSS/BLUE SHIELD | Admitting: Oncology

## 2016-06-19 VITALS — BP 114/75 | HR 86 | Temp 97.1°F | Resp 18 | Wt 160.9 lb

## 2016-06-19 DIAGNOSIS — C3411 Malignant neoplasm of upper lobe, right bronchus or lung: Secondary | ICD-10-CM | POA: Diagnosis not present

## 2016-06-19 DIAGNOSIS — G939 Disorder of brain, unspecified: Secondary | ICD-10-CM | POA: Diagnosis not present

## 2016-06-19 DIAGNOSIS — C7951 Secondary malignant neoplasm of bone: Secondary | ICD-10-CM

## 2016-06-19 DIAGNOSIS — Z79899 Other long term (current) drug therapy: Secondary | ICD-10-CM

## 2016-06-19 DIAGNOSIS — R944 Abnormal results of kidney function studies: Secondary | ICD-10-CM | POA: Diagnosis not present

## 2016-06-19 LAB — COMPREHENSIVE METABOLIC PANEL
ALK PHOS: 72 U/L (ref 38–126)
ALT: 14 U/L — ABNORMAL LOW (ref 17–63)
ANION GAP: 5 (ref 5–15)
AST: 16 U/L (ref 15–41)
Albumin: 3.4 g/dL — ABNORMAL LOW (ref 3.5–5.0)
BUN: 23 mg/dL — ABNORMAL HIGH (ref 6–20)
CALCIUM: 9.3 mg/dL (ref 8.9–10.3)
CO2: 29 mmol/L (ref 22–32)
Chloride: 103 mmol/L (ref 101–111)
Creatinine, Ser: 1.41 mg/dL — ABNORMAL HIGH (ref 0.61–1.24)
GFR calc non Af Amer: 51 mL/min — ABNORMAL LOW (ref >60)
GFR, EST AFRICAN AMERICAN: 59 mL/min — AB (ref >60)
Glucose, Bld: 160 mg/dL — ABNORMAL HIGH (ref 65–99)
POTASSIUM: 4.5 mmol/L (ref 3.5–5.1)
SODIUM: 137 mmol/L (ref 135–145)
TOTAL PROTEIN: 7 g/dL (ref 6.5–8.1)
Total Bilirubin: 0.4 mg/dL (ref 0.3–1.2)

## 2016-06-19 LAB — CBC WITH DIFFERENTIAL/PLATELET
Basophils Absolute: 0 10*3/uL (ref 0–0.1)
Basophils Relative: 1 %
EOS ABS: 0 10*3/uL (ref 0–0.7)
EOS PCT: 1 %
HCT: 32.3 % — ABNORMAL LOW (ref 40.0–52.0)
HEMOGLOBIN: 10.6 g/dL — AB (ref 13.0–18.0)
LYMPHS ABS: 0.4 10*3/uL — AB (ref 1.0–3.6)
Lymphocytes Relative: 8 %
MCH: 27 pg (ref 26.0–34.0)
MCHC: 32.9 g/dL (ref 32.0–36.0)
MCV: 82.1 fL (ref 80.0–100.0)
MONO ABS: 0.7 10*3/uL (ref 0.2–1.0)
MONOS PCT: 16 %
NEUTROS PCT: 74 %
Neutro Abs: 3.2 10*3/uL (ref 1.4–6.5)
Platelets: 210 10*3/uL (ref 150–440)
RBC: 3.93 MIL/uL — ABNORMAL LOW (ref 4.40–5.90)
RDW: 18 % — AB (ref 11.5–14.5)
WBC: 4.4 10*3/uL (ref 3.8–10.6)

## 2016-06-19 NOTE — Progress Notes (Signed)
Offers no complaints. States is feeling well. 

## 2016-06-22 ENCOUNTER — Other Ambulatory Visit: Payer: Self-pay | Admitting: Unknown Physician Specialty

## 2016-06-23 ENCOUNTER — Ambulatory Visit
Admission: RE | Admit: 2016-06-23 | Discharge: 2016-06-23 | Disposition: A | Discharge status: Home / Self Care | Payer: BLUE CROSS/BLUE SHIELD | Source: Ambulatory Visit | Attending: Radiation Oncology | Admitting: Radiation Oncology

## 2016-06-23 ENCOUNTER — Encounter: Payer: Self-pay | Admitting: Radiation Oncology

## 2016-06-23 VITALS — BP 128/89 | HR 89 | Temp 95.8°F | Resp 20 | Wt 160.8 lb

## 2016-06-23 DIAGNOSIS — C3411 Malignant neoplasm of upper lobe, right bronchus or lung: Secondary | ICD-10-CM | POA: Insufficient documentation

## 2016-06-23 DIAGNOSIS — Z510 Encounter for antineoplastic radiation therapy: Secondary | ICD-10-CM | POA: Diagnosis not present

## 2016-06-23 DIAGNOSIS — Z87891 Personal history of nicotine dependence: Secondary | ICD-10-CM | POA: Insufficient documentation

## 2016-06-23 NOTE — Progress Notes (Signed)
Radiation Oncology Follow up Note  Name: Ralph Lopez   Date:   06/23/2016 MRN:  [id number] DOB: Jun 21, 1951    This 65 y.o. male presents to the clinic today for follow-up status post initial course of radiation therapy for stage IV squamous cell carcinoma the right upper lobe.  REFERRING PROVIDER: Kathrine Haddock, NP  HPI: Patient is completed initial course of radiation therapy to his right upper lobe for presumed stage IV non-small cell lung cancer. He is seen today in routine follow-up and is doing well his cough has completely diminished. Specifically denies hemoptysis or chest tightness. He is having no dysphagia..  COMPLICATIONS OF TREATMENT: none  FOLLOW UP COMPLIANCE: keeps appointments   PHYSICAL EXAM:  BP 128/89   Pulse 89   Temp (!) 95.8 F (35.4 C)   Resp 20   Wt 160 lb 13.2 oz (73 kg)   BMI 25.96 kg/m  Well-developed well-nourished patient in NAD. HEENT reveals PERLA, EOMI, discs not visualized.  Oral cavity is clear. No oral mucosal lesions are identified. Neck is clear without evidence of cervical or supraclavicular adenopathy. Lungs are clear to A&P. Cardiac examination is essentially unremarkable with regular rate and rhythm without murmur rub or thrill. Abdomen is benign with no organomegaly or masses noted. Motor sensory and DTR levels are equal and symmetric in the upper and lower extremities. Cranial nerves II through XII are grossly intact. Proprioception is intact. No peripheral adenopathy or edema is identified. No motor or sensory levels are noted. Crude visual fields are within normal range.  RADIOLOGY RESULTS: Repeat CT scan for small field lung boost has been ordered  PLAN: At this time like to reevaluate his chest and possibly add another 2 weeks of palliative radiation therapy to prevent further atelectasis hemoptysis and progression of disease. I personally ordered CT simulation for later this week. Risks and benefits of further treatment were all  reviewed with patient and his wife. They both seem to compress my treatment plan well.  I would like to take this opportunity to thank you for allowing me to participate in the care of your patient.Armstead Peaks., MD

## 2016-06-24 ENCOUNTER — Encounter: Payer: Self-pay | Admitting: *Deleted

## 2016-06-25 ENCOUNTER — Encounter: Payer: Self-pay | Admitting: Oncology

## 2016-06-25 ENCOUNTER — Ambulatory Visit
Admission: RE | Admit: 2016-06-25 | Discharge: 2016-06-25 | Disposition: A | Discharge status: Home / Self Care | Payer: BLUE CROSS/BLUE SHIELD | Source: Ambulatory Visit | Attending: Radiation Oncology | Admitting: Radiation Oncology

## 2016-06-25 DIAGNOSIS — C3411 Malignant neoplasm of upper lobe, right bronchus or lung: Secondary | ICD-10-CM | POA: Diagnosis not present

## 2016-06-26 ENCOUNTER — Encounter: Payer: Self-pay | Admitting: Unknown Physician Specialty

## 2016-06-27 MED ORDER — AMLODIPINE BESYLATE 5 MG PO TABS: 5.0000 mg | ORAL_TABLET | Freq: Every day | ORAL | 3 refills | Status: DC

## 2016-06-30 DIAGNOSIS — C3411 Malignant neoplasm of upper lobe, right bronchus or lung: Secondary | ICD-10-CM | POA: Diagnosis not present

## 2016-07-03 ENCOUNTER — Other Ambulatory Visit: Payer: BLUE CROSS/BLUE SHIELD

## 2016-07-03 ENCOUNTER — Ambulatory Visit: Payer: BLUE CROSS/BLUE SHIELD

## 2016-07-03 ENCOUNTER — Ambulatory Visit: Payer: BLUE CROSS/BLUE SHIELD | Admitting: Oncology

## 2016-07-07 ENCOUNTER — Encounter: Payer: Self-pay | Admitting: Unknown Physician Specialty

## 2016-07-07 ENCOUNTER — Ambulatory Visit (INDEPENDENT_AMBULATORY_CARE_PROVIDER_SITE_OTHER): Payer: BLUE CROSS/BLUE SHIELD | Admitting: Unknown Physician Specialty

## 2016-07-07 VITALS — BP 108/71 | HR 90 | Temp 97.6°F | Ht 70.0 in | Wt 160.0 lb

## 2016-07-07 DIAGNOSIS — E782 Mixed hyperlipidemia: Secondary | ICD-10-CM | POA: Diagnosis not present

## 2016-07-07 DIAGNOSIS — Z794 Long term (current) use of insulin: Secondary | ICD-10-CM

## 2016-07-07 DIAGNOSIS — E1122 Type 2 diabetes mellitus with diabetic chronic kidney disease: Secondary | ICD-10-CM | POA: Diagnosis not present

## 2016-07-07 DIAGNOSIS — N183 Chronic kidney disease, stage 3 unspecified: Secondary | ICD-10-CM

## 2016-07-07 DIAGNOSIS — I10 Essential (primary) hypertension: Secondary | ICD-10-CM

## 2016-07-07 DIAGNOSIS — M25471 Effusion, right ankle: Secondary | ICD-10-CM

## 2016-07-07 LAB — CBC WITH DIFFERENTIAL/PLATELET
Hematocrit: 33.6 % — ABNORMAL LOW (ref 37.5–51.0)
Hemoglobin: 10.8 g/dL — ABNORMAL LOW (ref 13.0–17.7)
LYMPHS: 7 %
Lymphocytes Absolute: 0.5 10*3/uL — ABNORMAL LOW (ref 0.7–3.1)
MCH: 27.6 pg (ref 26.6–33.0)
MCHC: 32.1 g/dL (ref 31.5–35.7)
MCV: 86 fL (ref 79–97)
MID (Absolute): 1.3 10*3/uL (ref 0.1–1.6)
MID: 20 %
NEUTROS ABS: 4.7 10*3/uL (ref 1.4–7.0)
NEUTROS PCT: 72 %
PLATELETS: 329 10*3/uL (ref 150–379)
RBC: 3.91 x10E6/uL — ABNORMAL LOW (ref 4.14–5.80)
RDW: 19 % — AB (ref 12.3–15.4)
WBC: 6.5 10*3/uL (ref 3.4–10.8)

## 2016-07-07 LAB — BAYER DCA HB A1C WAIVED: HB A1C (BAYER DCA - WAIVED): 6.9 % (ref <7.0)

## 2016-07-07 MED ORDER — METHYLPREDNISOLONE 4 MG PO TBPK: ORAL_TABLET | ORAL | 0 refills | Status: DC

## 2016-07-07 NOTE — Progress Notes (Signed)
BP 108/71 (BP Location: Left Arm, Patient Position: Sitting, Cuff Size: Normal)   Pulse 90   Temp 97.6 F (36.4 C)   Ht '5\' 10"'$  (1.778 m) Comment: pt had shoes on  Wt 160 lb (72.6 kg) Comment: pt had shoes on  SpO2 98%   BMI 22.96 kg/m    Subjective:    Patient ID: Ralph Lopez, male    DOB: 31-Mar-1951, 65 y.o.   MRN: [id number]  HPI: Ralph Lopez is a 65 y.o. male  Chief Complaint  Patient presents with  . Diabetes    pt states he had eye exam this year, will fax form to [facility]  . Hyperlipidemia  . Hypertension   Getting cancer treatments and doing well.    Diabetes: Taking 30 units of Toujeo Using medications without difficulties No hypoglycemic episodes none No hyperglycemic episodes none Feet problems: Blood Sugars averaging: 108 eye exam within last year Last Hgb A1C: 7.0  Hypertension  Using medications without difficulty Average home BPs Not checking  Using medication without problems or lightheadedness No chest pain with exertion or shortness of breath No Edema  Elevated Cholesterol Using medications without problems No Muscle aches  Diet: Exercise: Currently getting chemo  Ankle swelling Left medial ankle swollen fter wearing bedroom slippers and being on feet a lot.  This has happened before.  Painful at night and during day.     Relevant past medical, surgical, family and social history reviewed and updated as indicated. Interim medical history since our last visit reviewed. Allergies and medications reviewed and updated.  Review of Systems  Per HPI unless specifically indicated above     Objective:    BP 108/71 (BP Location: Left Arm, Patient Position: Sitting, Cuff Size: Normal)   Pulse 90   Temp 97.6 F (36.4 C)   Ht '5\' 10"'$  (1.778 m) Comment: pt had shoes on  Wt 160 lb (72.6 kg) Comment: pt had shoes on  SpO2 98%   BMI 22.96 kg/m   Wt Readings from Last 3 Encounters:  07/07/16 160 lb (72.6 kg)  06/23/16 160 lb 13.2  oz (73 kg)  06/19/16 160 lb 15 oz (73 kg)    Physical Exam  Constitutional: He is oriented to person, place, and time. He appears well-developed and well-nourished. No distress.  HENT:  Head: Normocephalic and atraumatic.  Eyes: Conjunctivae and lids are normal. Right eye exhibits no discharge. Left eye exhibits no discharge. No scleral icterus.  Neck: Normal range of motion. Neck supple. No JVD present. Carotid bruit is not present.  Cardiovascular: Normal rate, regular rhythm and normal heart sounds.   Pulmonary/Chest: Effort normal and breath sounds normal. No respiratory distress.  Abdominal: Normal appearance. There is no splenomegaly or hepatomegaly.  Musculoskeletal: Normal range of motion.  Neurological: He is alert and oriented to person, place, and time.  Skin: Skin is warm, dry and intact. No rash noted. No pallor.  Psychiatric: He has a normal mood and affect. His behavior is normal. Judgment and thought content normal.   WBC is normal  Results for orders placed or performed in visit on 06/19/16  CBC with Differential  Result Value Ref Range   WBC 4.4 3.8 - 10.6 K/uL   RBC 3.93 (L) 4.40 - 5.90 MIL/uL   Hemoglobin 10.6 (L) 13.0 - 18.0 g/dL   HCT 32.3 (L) 40.0 - 52.0 %   MCV 82.1 80.0 - 100.0 fL   MCH 27.0 26.0 - 34.0 pg  MCHC 32.9 32.0 - 36.0 g/dL   RDW 18.0 (H) 11.5 - 14.5 %   Platelets 210 150 - 440 K/uL   Neutrophils Relative % 74 %   Neutro Abs 3.2 1.4 - 6.5 K/uL   Lymphocytes Relative 8 %   Lymphs Abs 0.4 (L) 1.0 - 3.6 K/uL   Monocytes Relative 16 %   Monocytes Absolute 0.7 0.2 - 1.0 K/uL   Eosinophils Relative 1 %   Eosinophils Absolute 0.0 0 - 0.7 K/uL   Basophils Relative 1 %   Basophils Absolute 0.0 0 - 0.1 K/uL  Comprehensive metabolic panel  Result Value Ref Range   Sodium 137 135 - 145 mmol/L   Potassium 4.5 3.5 - 5.1 mmol/L   Chloride 103 101 - 111 mmol/L   CO2 29 22 - 32 mmol/L   Glucose, Bld 160 (H) 65 - 99 mg/dL   BUN 23 (H) 6 - 20 mg/dL    Creatinine, Ser 1.41 (H) 0.61 - 1.24 mg/dL   Calcium 9.3 8.9 - 10.3 mg/dL   Total Protein 7.0 6.5 - 8.1 g/dL   Albumin 3.4 (L) 3.5 - 5.0 g/dL   AST 16 15 - 41 U/L   ALT 14 (L) 17 - 63 U/L   Alkaline Phosphatase 72 38 - 126 U/L   Total Bilirubin 0.4 0.3 - 1.2 mg/dL   GFR calc non Af Amer 51 (L) >60 mL/min   GFR calc Af Amer 59 (L) >60 mL/min   Anion gap 5 5 - 15      Assessment & Plan:   Problem List Items Addressed This Visit      Unprioritized   CKD (chronic kidney disease), stage III - Primary   Essential hypertension    Stable, continue present medications.        Relevant Medications   lisinopril (PRINIVIL,ZESTRIL) 40 MG tablet   Other Relevant Orders   Comprehensive metabolic panel   Hyperlipidemia   Relevant Medications   lisinopril (PRINIVIL,ZESTRIL) 40 MG tablet   Type 2 diabetes mellitus with stage 3 chronic kidney disease, with long-term current use of insulin (HCC)    Hgb a1C is 6.9%  Continue present      Relevant Medications   lisinopril (PRINIVIL,ZESTRIL) 40 MG tablet   Other Relevant Orders   Bayer DCA Hb A1c Waived    Other Visit Diagnoses    Right ankle swelling       CBC not elevated.  Will try a Medrol dose pack.  Warning increase in blood sugar   Relevant Orders   CBC With Differential/Platelet       Follow up plan: Return in about 3 months (around 10/05/2016).

## 2016-07-07 NOTE — Assessment & Plan Note (Signed)
Hgb a1C is 6.9%  Continue present

## 2016-07-07 NOTE — Assessment & Plan Note (Signed)
Stable, continue present medications.   

## 2016-07-08 ENCOUNTER — Ambulatory Visit
Admission: RE | Admit: 2016-07-08 | Discharge: 2016-07-08 | Disposition: A | Discharge status: Home / Self Care | Payer: BLUE CROSS/BLUE SHIELD | Source: Ambulatory Visit | Attending: Radiation Oncology | Admitting: Radiation Oncology

## 2016-07-08 DIAGNOSIS — C3411 Malignant neoplasm of upper lobe, right bronchus or lung: Secondary | ICD-10-CM | POA: Diagnosis not present

## 2016-07-08 LAB — COMPREHENSIVE METABOLIC PANEL
A/G RATIO: 1.2 (ref 1.2–2.2)
ALK PHOS: 75 IU/L (ref 39–117)
ALT: 11 IU/L (ref 0–44)
AST: 11 IU/L (ref 0–40)
Albumin: 3.7 g/dL (ref 3.6–4.8)
BUN/Creatinine Ratio: 14 (ref 10–24)
BUN: 21 mg/dL (ref 8–27)
CALCIUM: 10 mg/dL (ref 8.6–10.2)
CHLORIDE: 101 mmol/L (ref 96–106)
CO2: 26 mmol/L (ref 18–29)
Creatinine, Ser: 1.49 mg/dL — ABNORMAL HIGH (ref 0.76–1.27)
GFR calc Af Amer: 56 mL/min/{1.73_m2} — ABNORMAL LOW (ref >59)
GFR calc non Af Amer: 49 mL/min/{1.73_m2} — ABNORMAL LOW (ref >59)
GLOBULIN, TOTAL: 3.1 g/dL (ref 1.5–4.5)
Glucose: 67 mg/dL (ref 65–99)
POTASSIUM: 5.2 mmol/L (ref 3.5–5.2)
SODIUM: 141 mmol/L (ref 134–144)
Total Protein: 6.8 g/dL (ref 6.0–8.5)

## 2016-07-09 ENCOUNTER — Ambulatory Visit
Admission: RE | Admit: 2016-07-09 | Discharge: 2016-07-09 | Disposition: A | Discharge status: Home / Self Care | Payer: BLUE CROSS/BLUE SHIELD | Source: Ambulatory Visit | Attending: Radiation Oncology | Admitting: Radiation Oncology

## 2016-07-09 DIAGNOSIS — C3411 Malignant neoplasm of upper lobe, right bronchus or lung: Secondary | ICD-10-CM | POA: Diagnosis not present

## 2016-07-09 NOTE — Progress Notes (Signed)
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  INTERVAL HISTORY: Patient returns to clinic today for initiation of weekly Carbo/Taxol with XRT boost. Today is cycle 5 of Carbo/Taxol. He continues to tolerate his treatments well with only some mild nausea. He currently feels well and is asymptomatic.  He denies any fevers. He has no neurologic complaints. He has a good appetite and denies weight loss. He continues to have a cough, but denies shortness of breath, chest pain, or hemoptysis. He has no vomiting, constipation, or diarrhea. He has no urinary complaints. Patient offers no further specific complaints.  REVIEW OF SYSTEMS:   Review of Systems  Constitutional: Negative.  Negative for fever, malaise/fatigue and weight loss.  Respiratory: Positive for cough. Negative for hemoptysis and shortness of breath.   Cardiovascular: Negative.  Negative for chest pain and leg swelling.  Gastrointestinal: Negative.  Negative for abdominal pain.  Musculoskeletal: Negative.   Neurological: Negative.  Negative for sensory change and weakness.  Psychiatric/Behavioral: Negative.  The patient is not nervous/anxious.    As per HPI. Otherwise, a complete review of systems is negative.   PAST MEDICAL HISTORY: Past Medical History:  Diagnosis Date  . Anxiety   . Chronic kidney disease   . COPD (chronic obstructive pulmonary disease) ([place])   . Depression   . Diabetes mellitus without complication ([place])   . Hyperlipidemia   . Hypertension     PAST SURGICAL HISTORY: Past Surgical History:  Procedure Laterality Date  . EYE SURGERY    . FLEXIBLE BRONCHOSCOPY N/A 04/28/2016   Procedure: FLEXIBLE BRONCHOSCOPY;   Surgeon: [name], MD;  Location: [facility] ORS;  Service: Pulmonary;  Laterality: N/A;  . MANDIBLE SURGERY      FAMILY HISTORY: Family History  Problem Relation Age of Onset  . Diabetes Mother   . Hypertension Mother   . Breast cancer Mother   . Thrombosis Mother   . Heart disease Father     MI  . Breast cancer Sister   . Colon cancer Brother   . Lung cancer Brother     ADVANCED DIRECTIVES (Y/N):  N  HEALTH MAINTENANCE: Social History  Substance Use Topics  . Smoking status: Former Smoker    Years: 20.00    Quit date: 04/22/2014  . Smokeless tobacco: Never Used  . Alcohol use No     Colonoscopy:  PAP:  Bone density:  Lipid panel:  No Known Allergies  Current Outpatient Prescriptions  Medication Sig Dispense Refill  . amLODipine (NORVASC) 5 MG tablet Take 1 tablet (5 mg total) by mouth daily. 90 tablet 3  . aspirin EC 81 MG tablet Take 81 mg by mouth daily.    Marland Kitchen atenolol (TENORMIN) 100 MG tablet TAKE ONE TABLET BY MOUTH ONCE DAILY 90 tablet 0  . atorvastatin (LIPITOR) 10 MG tablet Take 10 mg by mouth daily.    Marland Kitchen BYDUREON 2 MG PEN INJECT '2MG'$  SUBCUTANEOUSLY WEEKLY 4 each 0  . Insulin Glargine (TOUJEO SOLOSTAR) 300 UNIT/ML SOPN Inject 30 Units as directed daily. 4 pen 6  . lisinopril (PRINIVIL,ZESTRIL) 40 MG tablet Take 40 mg by mouth daily.    Marland Kitchen LORazepam (ATIVAN) 0.5 MG tablet Take 1 tablet (0.5 mg total) by mouth  2 (two) times daily. 60 tablet 0  . methylPREDNISolone (MEDROL DOSEPAK) 4 MG TBPK tablet As directed 21 tablet 0  . ondansetron (ZOFRAN) 8 MG tablet Take 1 tablet (8 mg total) by mouth 2 (two) times daily as needed for refractory nausea / vomiting. 30 tablet 1  . pantoprazole (PROTONIX) 40 MG tablet Take 1 tablet (40 mg total) by mouth daily. 90 tablet 3  . prochlorperazine (COMPAZINE) 10 MG tablet Take 1 tablet (10 mg total) by mouth every 6 (six) hours as needed (Nausea or vomiting). 30 tablet 1  . ranitidine (ZANTAC) 150 MG tablet Take 150 mg by mouth  daily as needed for heartburn.     . umeclidinium-vilanterol (ANORO ELLIPTA) 62.5-25 MCG/INH AEPB Inhale 1 puff into the lungs daily. 1 each 12  . zolpidem (AMBIEN) 5 MG tablet Take 1 tablet (5 mg total) by mouth at bedtime as needed for sleep. 30 tablet 1   No current facility-administered medications for this visit.    Facility-Administered Medications Ordered in Other Visits  Medication Dose Route Frequency Provider Last Rate Last Dose  . CARBOplatin (PARAPLATIN) 180 mg in sodium chloride 0.9 % 250 mL chemo infusion  180 mg Intravenous Once Lloyd Huger, MD      . PACLitaxel (TAXOL) 84 mg in sodium chloride 0.9 % 250 mL chemo infusion (</= '80mg'$ /m2)  45 mg/m2 (Treatment Plan Recorded) Intravenous Once Lloyd Huger, MD 264 mL/hr at 07/10/16 1055 84 mg at 07/10/16 1055    OBJECTIVE: Vitals:   07/10/16 0850  BP: (!) 150/93  Pulse: 93  Resp: 18  Temp: (!) 96.9 F (36.1 C)     Body mass index is 23.25 kg/m.    ECOG FS:0 - Asymptomatic  General: Well-developed, well-nourished, no acute distress. Eyes: Pink conjunctiva, anicteric sclera. Lungs: Clear to auscultation bilaterally. Heart: Regular rate and rhythm. No rubs, murmurs, or gallops. Abdomen: Soft, nontender, nondistended. No organomegaly noted, normoactive bowel sounds. Musculoskeletal: No edema, cyanosis, or clubbing. Neuro: Alert, answering all questions appropriately. Cranial nerves grossly intact. Skin: No rashes or petechiae noted. Psych: Normal affect.   LAB RESULTS:  Lab Results  Component Value Date   NA 140 07/10/2016   K 4.1 07/10/2016   CL 107 07/10/2016   CO2 27 07/10/2016   GLUCOSE 148 (H) 07/10/2016   BUN 30 (H) 07/10/2016   CREATININE 1.17 07/10/2016   CALCIUM 9.5 07/10/2016   PROT 7.8 07/10/2016   ALBUMIN 3.8 07/10/2016   AST 17 07/10/2016   ALT 19 07/10/2016   ALKPHOS 61 07/10/2016   BILITOT 0.3 07/10/2016   GFRNONAA >60 07/10/2016   GFRAA >60 07/10/2016    Lab Results  Component  Value Date   WBC 10.1 07/10/2016   NEUTROABS 7.9 (H) 07/10/2016   HGB 11.4 (L) 07/10/2016   HCT 34.1 (L) 07/10/2016   MCV 84.3 07/10/2016   PLT 356 07/10/2016     STUDIES: No results found.  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 Grayland Ormond. Biopsy confirmed squamous cell carcinoma. MRI the brain noted 2 mm focus concerning for metastatic disease, but currently too small to characterize. Initially will treat patient as stage III disease and monitor his isolated metastasis in his left pubic bone. Proceed with cycle 5 weekly carboplatinum  and Taxol along with concurrent XRT boost. Return to clinic in 1 week for continuation of XRT boost as well and cycle 6 of carboplatinum and Taxol.  2. Renal insufficiency: Patient's creatinine is WNL today. BUN slightly elevated. Monitor.  3. Brain lesion: 2 mm focus concerning for metastatic disease, but too small to characterize. Consider repeat brain imaging in 3 month in January 2018. 4. Hyperglycemia: Blood glucose slightly elevated today. Monitor since patient is receiving dexamethasone with his treatments.   Patient expressed understanding and was in agreement with this plan. He also understands that He can call clinic at any time with any questions, concerns, or complaints.   Primary cancer of right upper lobe of lung [facility])   Staging form: Lung, AJCC 7th Edition   - Clinical stage from 05/09/2016: Stage IV (T3, N2, M1b) - Signed by Lloyd Huger, MD on 05/09/2016   Faythe Casa, NP 07/10/2016  Patient was seen and evaluated independently and I agree with the assessment and plan as dictated above. At the completion of patient's XRT he will benefit from 2 doses of consolidation chemotherapy.  Lloyd Huger, MD  07/13/16 8:08 AM

## 2016-07-10 ENCOUNTER — Inpatient Hospital Stay: Payer: BLUE CROSS/BLUE SHIELD

## 2016-07-10 ENCOUNTER — Inpatient Hospital Stay: Payer: BLUE CROSS/BLUE SHIELD | Attending: Oncology

## 2016-07-10 ENCOUNTER — Other Ambulatory Visit: Payer: Self-pay | Admitting: Unknown Physician Specialty

## 2016-07-10 ENCOUNTER — Ambulatory Visit
Admission: RE | Admit: 2016-07-10 | Discharge: 2016-07-10 | Disposition: A | Discharge status: Home / Self Care | Payer: BLUE CROSS/BLUE SHIELD | Source: Ambulatory Visit | Attending: Radiation Oncology | Admitting: Radiation Oncology

## 2016-07-10 ENCOUNTER — Inpatient Hospital Stay (HOSPITAL_BASED_OUTPATIENT_CLINIC_OR_DEPARTMENT_OTHER): Payer: BLUE CROSS/BLUE SHIELD | Admitting: Oncology

## 2016-07-10 VITALS — BP 150/93 | HR 93 | Temp 96.9°F | Resp 18 | Wt 162.0 lb

## 2016-07-10 DIAGNOSIS — Z5111 Encounter for antineoplastic chemotherapy: Secondary | ICD-10-CM | POA: Insufficient documentation

## 2016-07-10 DIAGNOSIS — F329 Major depressive disorder, single episode, unspecified: Secondary | ICD-10-CM | POA: Diagnosis not present

## 2016-07-10 DIAGNOSIS — Z87891 Personal history of nicotine dependence: Secondary | ICD-10-CM | POA: Diagnosis not present

## 2016-07-10 DIAGNOSIS — C7951 Secondary malignant neoplasm of bone: Secondary | ICD-10-CM | POA: Insufficient documentation

## 2016-07-10 DIAGNOSIS — N189 Chronic kidney disease, unspecified: Secondary | ICD-10-CM | POA: Insufficient documentation

## 2016-07-10 DIAGNOSIS — C3411 Malignant neoplasm of upper lobe, right bronchus or lung: Secondary | ICD-10-CM | POA: Insufficient documentation

## 2016-07-10 DIAGNOSIS — Z79899 Other long term (current) drug therapy: Secondary | ICD-10-CM | POA: Diagnosis not present

## 2016-07-10 DIAGNOSIS — E785 Hyperlipidemia, unspecified: Secondary | ICD-10-CM | POA: Diagnosis not present

## 2016-07-10 DIAGNOSIS — Z7982 Long term (current) use of aspirin: Secondary | ICD-10-CM | POA: Diagnosis not present

## 2016-07-10 DIAGNOSIS — J449 Chronic obstructive pulmonary disease, unspecified: Secondary | ICD-10-CM | POA: Insufficient documentation

## 2016-07-10 DIAGNOSIS — G939 Disorder of brain, unspecified: Secondary | ICD-10-CM

## 2016-07-10 DIAGNOSIS — E1122 Type 2 diabetes mellitus with diabetic chronic kidney disease: Secondary | ICD-10-CM | POA: Diagnosis not present

## 2016-07-10 DIAGNOSIS — F419 Anxiety disorder, unspecified: Secondary | ICD-10-CM | POA: Diagnosis not present

## 2016-07-10 DIAGNOSIS — I129 Hypertensive chronic kidney disease with stage 1 through stage 4 chronic kidney disease, or unspecified chronic kidney disease: Secondary | ICD-10-CM | POA: Diagnosis not present

## 2016-07-10 DIAGNOSIS — Z794 Long term (current) use of insulin: Secondary | ICD-10-CM | POA: Diagnosis not present

## 2016-07-10 LAB — COMPREHENSIVE METABOLIC PANEL
ALT: 19 U/L (ref 17–63)
ANION GAP: 6 (ref 5–15)
AST: 17 U/L (ref 15–41)
Albumin: 3.8 g/dL (ref 3.5–5.0)
Alkaline Phosphatase: 61 U/L (ref 38–126)
BUN: 30 mg/dL — ABNORMAL HIGH (ref 6–20)
CALCIUM: 9.5 mg/dL (ref 8.9–10.3)
CHLORIDE: 107 mmol/L (ref 101–111)
CO2: 27 mmol/L (ref 22–32)
Creatinine, Ser: 1.17 mg/dL (ref 0.61–1.24)
Glucose, Bld: 148 mg/dL — ABNORMAL HIGH (ref 65–99)
Potassium: 4.1 mmol/L (ref 3.5–5.1)
SODIUM: 140 mmol/L (ref 135–145)
Total Bilirubin: 0.3 mg/dL (ref 0.3–1.2)
Total Protein: 7.8 g/dL (ref 6.5–8.1)

## 2016-07-10 LAB — CBC WITH DIFFERENTIAL/PLATELET
Basophils Absolute: 0 10*3/uL (ref 0–0.1)
Basophils Relative: 0 %
EOS ABS: 0 10*3/uL (ref 0–0.7)
EOS PCT: 0 %
HCT: 34.1 % — ABNORMAL LOW (ref 40.0–52.0)
Hemoglobin: 11.4 g/dL — ABNORMAL LOW (ref 13.0–18.0)
LYMPHS ABS: 0.7 10*3/uL — AB (ref 1.0–3.6)
Lymphocytes Relative: 7 %
MCH: 28.2 pg (ref 26.0–34.0)
MCHC: 33.5 g/dL (ref 32.0–36.0)
MCV: 84.3 fL (ref 80.0–100.0)
MONO ABS: 1.5 10*3/uL — AB (ref 0.2–1.0)
MONOS PCT: 15 %
Neutro Abs: 7.9 10*3/uL — ABNORMAL HIGH (ref 1.4–6.5)
Neutrophils Relative %: 78 %
PLATELETS: 356 10*3/uL (ref 150–440)
RBC: 4.05 MIL/uL — ABNORMAL LOW (ref 4.40–5.90)
RDW: 21.7 % — ABNORMAL HIGH (ref 11.5–14.5)
WBC: 10.1 10*3/uL (ref 3.8–10.6)

## 2016-07-10 MED ORDER — SODIUM CHLORIDE 0.9 % IV SOLN
Freq: Once | INTRAVENOUS | Status: AC
  Administered 2016-07-10: 10:00:00 via INTRAVENOUS
  Filled 2016-07-10: qty 1000

## 2016-07-10 MED ORDER — SODIUM CHLORIDE 0.9 % IV SOLN
45.0000 mg/m2 | Freq: Once | INTRAVENOUS | Status: AC
  Administered 2016-07-10: 84 mg via INTRAVENOUS
  Filled 2016-07-10: qty 14

## 2016-07-10 MED ORDER — DEXAMETHASONE SODIUM PHOSPHATE 10 MG/ML IJ SOLN
10.0000 mg | Freq: Once | INTRAMUSCULAR | Status: AC
  Administered 2016-07-10: 10 mg via INTRAVENOUS
  Filled 2016-07-10: qty 1

## 2016-07-10 MED ORDER — SODIUM CHLORIDE 0.9 % IV SOLN
180.0000 mg | Freq: Once | INTRAVENOUS | Status: AC
  Administered 2016-07-10: 180 mg via INTRAVENOUS
  Filled 2016-07-10: qty 18

## 2016-07-10 MED ORDER — FAMOTIDINE IN NACL 20-0.9 MG/50ML-% IV SOLN
20.0000 mg | Freq: Once | INTRAVENOUS | Status: AC
  Administered 2016-07-10: 20 mg via INTRAVENOUS
  Filled 2016-07-10: qty 50

## 2016-07-10 MED ORDER — PALONOSETRON HCL INJECTION 0.25 MG/5ML
0.2500 mg | Freq: Once | INTRAVENOUS | Status: AC
  Administered 2016-07-10: 0.25 mg via INTRAVENOUS
  Filled 2016-07-10: qty 5

## 2016-07-10 NOTE — Progress Notes (Signed)
Carbo dose '180mg'$  today different from last dose of '140mg'$  due to change in SrCr (<10%).

## 2016-07-10 NOTE — Telephone Encounter (Signed)
Routing to provider  

## 2016-07-10 NOTE — Telephone Encounter (Signed)
Patients wife called to see if [facility] had faxed request for pts BYDUREON '2mg'$  pen. He needs this today.   Thank [place]

## 2016-07-10 NOTE — Progress Notes (Signed)
Offers no complaints  

## 2016-07-11 ENCOUNTER — Ambulatory Visit
Admission: RE | Admit: 2016-07-11 | Discharge: 2016-07-11 | Disposition: A | Discharge status: Home / Self Care | Payer: BLUE CROSS/BLUE SHIELD | Source: Ambulatory Visit | Attending: Radiation Oncology | Admitting: Radiation Oncology

## 2016-07-11 DIAGNOSIS — C3411 Malignant neoplasm of upper lobe, right bronchus or lung: Secondary | ICD-10-CM | POA: Diagnosis not present

## 2016-07-15 ENCOUNTER — Ambulatory Visit
Admission: RE | Admit: 2016-07-15 | Discharge: 2016-07-15 | Disposition: A | Discharge status: Home / Self Care | Payer: BLUE CROSS/BLUE SHIELD | Source: Ambulatory Visit | Attending: Radiation Oncology | Admitting: Radiation Oncology

## 2016-07-15 DIAGNOSIS — C3411 Malignant neoplasm of upper lobe, right bronchus or lung: Secondary | ICD-10-CM | POA: Diagnosis not present

## 2016-07-15 NOTE — Progress Notes (Signed)
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  INTERVAL HISTORY: Patient returns to clinic today for continuation of weekly Carbo/Taxol with XRT boost. Today is cycle 7 of Carbo/Taxol. He continues to tolerate his treatments well.  He currently feels well and is asymptomatic. He does complain of continuing mild fatigue, not interfering with daily activities.  He currently has a runny nose, no cough, chest tightness, chest pain, or fever. He reports no difficulty falling asleep, but awakens after 3hrs and finds he is unable to go back to sleep; he took Ambien '5mg'$  one night, without relief, and stopped taking it. He denies numbness or tingling, vision or hearing changes. He has a good appetite and denies weight loss. He reports dyspnea on exertion but no current shortness of breath. He complains of constipation, recently stopped taking daily Miralax when he ran out of it. He has no vomiting or diarrhea. He has no urinary complaints. Patient offers no further specific complaints.  REVIEW OF SYSTEMS:   Review of Systems  Constitutional: Positive for malaise/fatigue. Negative for chills, fever and weight loss.  HENT: Negative for congestion and hearing loss.   Eyes: Negative for blurred vision.  Respiratory: Negative for cough, hemoptysis and shortness of breath.   Cardiovascular: Negative.  Negative for chest pain and leg swelling.  Gastrointestinal: Positive for constipation. Negative for abdominal pain, diarrhea, nausea and vomiting.  Genitourinary: Negative for frequency and urgency.  Musculoskeletal: Negative.  Negative for myalgias.  Neurological: Negative.  Negative for tingling, sensory change,  weakness and headaches.  Psychiatric/Behavioral: The patient has insomnia. The patient is not nervous/anxious.    As per HPI. Otherwise, a complete review of systems is negative.   PAST MEDICAL HISTORY: Past Medical History:  Diagnosis Date  . Anxiety   . Chronic kidney disease   . COPD (chronic obstructive pulmonary disease) ([place])   . Depression   . Diabetes mellitus without complication ([place])   . Hyperlipidemia   . Hypertension     PAST SURGICAL HISTORY: Past Surgical History:  Procedure Laterality Date  . EYE SURGERY    . FLEXIBLE BRONCHOSCOPY N/A 04/28/2016   Procedure: FLEXIBLE BRONCHOSCOPY;  Surgeon: [name], MD;  Location: [facility] ORS;  Service: Pulmonary;  Laterality: N/A;  . MANDIBLE SURGERY      FAMILY HISTORY: Family History  Problem Relation Age of Onset  . Diabetes Mother   . Hypertension Mother   . Breast cancer Mother   . Thrombosis Mother   . Heart disease Father     MI  . Breast cancer Sister   . Colon cancer Brother   . Lung cancer Brother     ADVANCED DIRECTIVES (Y/N):  N  HEALTH MAINTENANCE: Social History  Substance Use Topics  . Smoking status: Former Smoker    Years: 20.00    Quit date: 04/22/2014  . Smokeless tobacco: Never Used  . Alcohol use No     Colonoscopy:  PAP:  Bone density:  Lipid panel:  No Known Allergies  Current Outpatient Prescriptions  Medication Sig Dispense Refill  . amLODipine (NORVASC) 5 MG tablet Take 1 tablet (5 mg total) by mouth daily. 90 tablet 3  . aspirin EC 81 MG tablet Take 81 mg by  mouth daily.    Marland Kitchen atenolol (TENORMIN) 100 MG tablet TAKE ONE TABLET BY MOUTH ONCE DAILY 90 tablet 0  . atorvastatin (LIPITOR) 10 MG tablet Take 10 mg by mouth daily.    Marland Kitchen BYDUREON 2 MG PEN INJECT '2MG'$  SUBCUTANEOUSLY WEEKLY 4 each 2  . Insulin Glargine (TOUJEO SOLOSTAR) 300 UNIT/ML SOPN Inject 30 Units as directed daily. 4 pen 6  . lisinopril (PRINIVIL,ZESTRIL) 40 MG tablet Take 40 mg by mouth daily.    Marland Kitchen LORazepam  (ATIVAN) 0.5 MG tablet Take 1 tablet (0.5 mg total) by mouth 2 (two) times daily. 60 tablet 0  . methylPREDNISolone (MEDROL DOSEPAK) 4 MG TBPK tablet As directed 21 tablet 0  . ondansetron (ZOFRAN) 8 MG tablet Take 1 tablet (8 mg total) by mouth 2 (two) times daily as needed for refractory nausea / vomiting. 30 tablet 1  . pantoprazole (PROTONIX) 40 MG tablet Take 1 tablet (40 mg total) by mouth daily. 90 tablet 3  . prochlorperazine (COMPAZINE) 10 MG tablet Take 1 tablet (10 mg total) by mouth every 6 (six) hours as needed (Nausea or vomiting). 30 tablet 1  . ranitidine (ZANTAC) 150 MG tablet Take 150 mg by mouth daily as needed for heartburn.     . umeclidinium-vilanterol (ANORO ELLIPTA) 62.5-25 MCG/INH AEPB Inhale 1 puff into the lungs daily. 1 each 12  . zolpidem (AMBIEN) 5 MG tablet Take 1 tablet (5 mg total) by mouth at bedtime as needed for sleep. 30 tablet 1   No current facility-administered medications for this visit.     OBJECTIVE: Vitals:   07/17/16 0855  BP: 101/64  Pulse: 94  Resp: 18  Temp: 97.4 F (36.3 C)     Body mass index is 23.16 kg/m.    ECOG FS:0 - Asymptomatic  General: Well-developed, well-nourished, no acute distress. Eyes: Pink conjunctiva, anicteric sclera. Lungs: Clear to auscultation bilaterally. Heart: Regular rate and rhythm. No rubs, murmurs, or gallops. Abdomen: Soft, nontender, nondistended. No organomegaly noted, normoactive bowel sounds. Musculoskeletal: No edema, cyanosis, or clubbing. Neuro: Alert, answering all questions appropriately. Cranial nerves grossly intact. Skin: No rashes or petechiae noted. Psych: Normal affect.   LAB RESULTS:  Lab Results  Component Value Date   NA 140 07/17/2016   K 5.6 (H) 07/17/2016   CL 106 07/17/2016   CO2 30 07/17/2016   GLUCOSE 140 (H) 07/17/2016   BUN 29 (H) 07/17/2016   CREATININE 1.60 (H) 07/17/2016   CALCIUM 9.6 07/17/2016   PROT 7.3 07/17/2016   ALBUMIN 3.4 (L) 07/17/2016   AST 19  07/17/2016   ALT 15 (L) 07/17/2016   ALKPHOS 61 07/17/2016   BILITOT 0.4 07/17/2016   GFRNONAA 44 (L) 07/17/2016   GFRAA 51 (L) 07/17/2016    Lab Results  Component Value Date   WBC 7.9 07/17/2016   NEUTROABS 6.0 07/17/2016   HGB 11.2 (L) 07/17/2016   HCT 33.9 (L) 07/17/2016   MCV 86.0 07/17/2016   PLT 299 07/17/2016     STUDIES: No results found.  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 Grayland Ormond. Biopsy confirmed squamous cell carcinoma. MRI the brain noted 2 mm focus concerning for metastatic disease, but currently too small to characterize. Initially will treat patient as stage III disease and  monitor his isolated metastasis in his left pubic bone. Proceed with cycle 7 weekly carboplatinum and Taxol along with concurrent XRT boost. Return to clinic in 1 week for continuation of XRT boost as well and cycle 8 of carboplatinum and Taxol. At the completion of patient's XRT he will benefit from 2 doses of consolidation chemotherapy.  2. Renal insufficiency: Patient's creatinine is WNL today. BUN slightly elevated. Monitor.  3. Brain lesion: 2 mm focus concerning for metastatic disease, but too small to characterize. Consider repeat brain imaging in 3 month in January 2018. 4. Hyperglycemia: Blood glucose slightly elevated today. Monitor since patient is receiving dexamethasone with his treatments.  5. Insomnia: Double dose to '10mg'$  Ambien at night. 6. Constipation: Start taking miralax daily. 7. Fatigue: Activities to tolerance. Monitor.  Patient expressed understanding and was in agreement with this plan. He also understands that He can call clinic at any time with any questions, concerns, or complaints.   Primary cancer of right upper lobe of lung [facility])   Staging  form: Lung, AJCC 7th Edition   - Clinical stage from 05/09/2016: Stage IV (T3, N2, M1b) - Signed by Lloyd Huger, MD on 05/09/2016   Lucendia Herrlich, NP 07/17/16 0945  Patient was seen and evaluated independently and I agree with the assessment and plan as dictated above. Patient has a mild hyperkalemia today. Monitor closely and repeat laboratory work next week.  Lloyd Huger, MD 07/18/16 1:26 PM

## 2016-07-16 ENCOUNTER — Ambulatory Visit
Admission: RE | Admit: 2016-07-16 | Discharge: 2016-07-16 | Disposition: A | Discharge status: Home / Self Care | Payer: BLUE CROSS/BLUE SHIELD | Source: Ambulatory Visit | Attending: Radiation Oncology | Admitting: Radiation Oncology

## 2016-07-16 ENCOUNTER — Telehealth: Payer: Self-pay | Admitting: Unknown Physician Specialty

## 2016-07-16 DIAGNOSIS — C3411 Malignant neoplasm of upper lobe, right bronchus or lung: Secondary | ICD-10-CM | POA: Diagnosis not present

## 2016-07-16 NOTE — Telephone Encounter (Signed)
If he's not better, he really needs to be seen.

## 2016-07-16 NOTE — Telephone Encounter (Signed)
Routing to provider  

## 2016-07-16 NOTE — Telephone Encounter (Signed)
Pt's wife called stated pt's foot is giving him trouble again. Wants to know if Malachy Mood can call in more of the medication she gave him at his last visit. Pharm is [facility] in [place]. Did not know the name of the medication. Thanks.

## 2016-07-17 ENCOUNTER — Inpatient Hospital Stay: Payer: BLUE CROSS/BLUE SHIELD

## 2016-07-17 ENCOUNTER — Other Ambulatory Visit: Payer: Self-pay

## 2016-07-17 ENCOUNTER — Ambulatory Visit
Admission: RE | Admit: 2016-07-17 | Discharge: 2016-07-17 | Disposition: A | Discharge status: Home / Self Care | Payer: BLUE CROSS/BLUE SHIELD | Source: Ambulatory Visit | Attending: Radiation Oncology | Admitting: Radiation Oncology

## 2016-07-17 ENCOUNTER — Inpatient Hospital Stay (HOSPITAL_BASED_OUTPATIENT_CLINIC_OR_DEPARTMENT_OTHER): Payer: BLUE CROSS/BLUE SHIELD | Admitting: Oncology

## 2016-07-17 VITALS — BP 101/64 | HR 94 | Temp 97.4°F | Resp 18 | Wt 161.4 lb

## 2016-07-17 DIAGNOSIS — C3411 Malignant neoplasm of upper lobe, right bronchus or lung: Secondary | ICD-10-CM | POA: Diagnosis not present

## 2016-07-17 DIAGNOSIS — C7951 Secondary malignant neoplasm of bone: Secondary | ICD-10-CM | POA: Diagnosis not present

## 2016-07-17 DIAGNOSIS — G939 Disorder of brain, unspecified: Secondary | ICD-10-CM

## 2016-07-17 DIAGNOSIS — Z79899 Other long term (current) drug therapy: Secondary | ICD-10-CM | POA: Diagnosis not present

## 2016-07-17 LAB — CBC WITH DIFFERENTIAL/PLATELET
BASOS ABS: 0 10*3/uL (ref 0–0.1)
Basophils Relative: 1 %
Eosinophils Absolute: 0.1 10*3/uL (ref 0–0.7)
Eosinophils Relative: 1 %
HEMATOCRIT: 33.9 % — AB (ref 40.0–52.0)
Hemoglobin: 11.2 g/dL — ABNORMAL LOW (ref 13.0–18.0)
LYMPHS ABS: 0.6 10*3/uL — AB (ref 1.0–3.6)
LYMPHS PCT: 7 %
MCH: 28.3 pg (ref 26.0–34.0)
MCHC: 33 g/dL (ref 32.0–36.0)
MCV: 86 fL (ref 80.0–100.0)
MONO ABS: 1.2 10*3/uL — AB (ref 0.2–1.0)
MONOS PCT: 15 %
NEUTROS ABS: 6 10*3/uL (ref 1.4–6.5)
Neutrophils Relative %: 76 %
Platelets: 299 10*3/uL (ref 150–440)
RBC: 3.94 MIL/uL — ABNORMAL LOW (ref 4.40–5.90)
RDW: 21.2 % — AB (ref 11.5–14.5)
WBC: 7.9 10*3/uL (ref 3.8–10.6)

## 2016-07-17 LAB — COMPREHENSIVE METABOLIC PANEL
ALT: 15 U/L — ABNORMAL LOW (ref 17–63)
AST: 19 U/L (ref 15–41)
Albumin: 3.4 g/dL — ABNORMAL LOW (ref 3.5–5.0)
Alkaline Phosphatase: 61 U/L (ref 38–126)
Anion gap: 4 — ABNORMAL LOW (ref 5–15)
BUN: 29 mg/dL — AB (ref 6–20)
CO2: 30 mmol/L (ref 22–32)
Calcium: 9.6 mg/dL (ref 8.9–10.3)
Chloride: 106 mmol/L (ref 101–111)
Creatinine, Ser: 1.6 mg/dL — ABNORMAL HIGH (ref 0.61–1.24)
GFR calc Af Amer: 51 mL/min — ABNORMAL LOW (ref >60)
GFR, EST NON AFRICAN AMERICAN: 44 mL/min — AB (ref >60)
Glucose, Bld: 140 mg/dL — ABNORMAL HIGH (ref 65–99)
POTASSIUM: 5.6 mmol/L — AB (ref 3.5–5.1)
Sodium: 140 mmol/L (ref 135–145)
TOTAL PROTEIN: 7.3 g/dL (ref 6.5–8.1)
Total Bilirubin: 0.4 mg/dL (ref 0.3–1.2)

## 2016-07-17 MED ORDER — PALONOSETRON HCL INJECTION 0.25 MG/5ML
0.2500 mg | Freq: Once | INTRAVENOUS | Status: AC
  Administered 2016-07-17: 0.25 mg via INTRAVENOUS
  Filled 2016-07-17: qty 5

## 2016-07-17 MED ORDER — FAMOTIDINE IN NACL 20-0.9 MG/50ML-% IV SOLN
20.0000 mg | Freq: Once | INTRAVENOUS | Status: AC
  Administered 2016-07-17: 20 mg via INTRAVENOUS
  Filled 2016-07-17: qty 50

## 2016-07-17 MED ORDER — SODIUM CHLORIDE 0.9 % IV SOLN
Freq: Once | INTRAVENOUS | Status: AC
  Administered 2016-07-17: 11:00:00 via INTRAVENOUS
  Filled 2016-07-17: qty 1000

## 2016-07-17 MED ORDER — SODIUM CHLORIDE 0.9 % IV SOLN
144.4000 mg | Freq: Once | INTRAVENOUS | Status: AC
  Administered 2016-07-17: 140 mg via INTRAVENOUS
  Filled 2016-07-17: qty 14

## 2016-07-17 MED ORDER — SODIUM CHLORIDE 0.9 % IV SOLN
45.0000 mg/m2 | Freq: Once | INTRAVENOUS | Status: AC
  Administered 2016-07-17: 84 mg via INTRAVENOUS
  Filled 2016-07-17: qty 14

## 2016-07-17 MED ORDER — DEXAMETHASONE SODIUM PHOSPHATE 10 MG/ML IJ SOLN
10.0000 mg | Freq: Once | INTRAMUSCULAR | Status: AC
  Administered 2016-07-17: 10 mg via INTRAVENOUS
  Filled 2016-07-17: qty 1

## 2016-07-17 NOTE — Telephone Encounter (Signed)
Called and left a VM letting patient and/or his wife know that he would need an appointment if his foot is not getting any better. Asked for them to give [place] a call to schedule an appointment.

## 2016-07-17 NOTE — Progress Notes (Signed)
Offers no complaints. Feeling well. 

## 2016-07-18 ENCOUNTER — Ambulatory Visit
Admission: RE | Admit: 2016-07-18 | Discharge: 2016-07-18 | Disposition: A | Discharge status: Home / Self Care | Payer: BLUE CROSS/BLUE SHIELD | Source: Ambulatory Visit | Attending: Radiation Oncology | Admitting: Radiation Oncology

## 2016-07-18 DIAGNOSIS — C3411 Malignant neoplasm of upper lobe, right bronchus or lung: Secondary | ICD-10-CM | POA: Diagnosis not present

## 2016-07-22 ENCOUNTER — Ambulatory Visit
Admission: RE | Admit: 2016-07-22 | Discharge: 2016-07-22 | Disposition: A | Discharge status: Home / Self Care | Payer: BLUE CROSS/BLUE SHIELD | Source: Ambulatory Visit | Attending: Radiation Oncology | Admitting: Radiation Oncology

## 2016-07-22 DIAGNOSIS — C3411 Malignant neoplasm of upper lobe, right bronchus or lung: Secondary | ICD-10-CM | POA: Diagnosis not present

## 2016-07-23 ENCOUNTER — Ambulatory Visit
Admission: RE | Admit: 2016-07-23 | Discharge: 2016-07-23 | Disposition: A | Discharge status: Home / Self Care | Payer: BLUE CROSS/BLUE SHIELD | Source: Ambulatory Visit | Attending: Radiation Oncology | Admitting: Radiation Oncology

## 2016-07-23 DIAGNOSIS — C3411 Malignant neoplasm of upper lobe, right bronchus or lung: Secondary | ICD-10-CM | POA: Diagnosis not present

## 2016-07-23 NOTE — Progress Notes (Deleted)
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  INTERVAL HISTORY: Patient returns to clinic today for continuation of weekly Carbo/Taxol with XRT boost. Today is cycle 7 of Carbo/Taxol. He continues to tolerate his treatments well.  He currently feels well and is asymptomatic. He does complain of continuing mild fatigue, not interfering with daily activities.  He currently has a runny nose, no cough, chest tightness, chest pain, or fever. He reports no difficulty falling asleep, but awakens after 3hrs and finds he is unable to go back to sleep; he took Ambien '5mg'$  one night, without relief, and stopped taking it. He denies numbness or tingling, vision or hearing changes. He has a good appetite and denies weight loss. He reports dyspnea on exertion but no current shortness of breath. He complains of constipation, recently stopped taking daily Miralax when he ran out of it. He has no vomiting or diarrhea. He has no urinary complaints. Patient offers no further specific complaints.  REVIEW OF SYSTEMS:   Review of Systems  Constitutional: Positive for malaise/fatigue. Negative for chills, fever and weight loss.  HENT: Negative for congestion and hearing loss.   Eyes: Negative for blurred vision.  Respiratory: Negative for cough, hemoptysis and shortness of breath.   Cardiovascular: Negative.  Negative for chest pain and leg swelling.  Gastrointestinal: Positive for constipation. Negative for abdominal pain, diarrhea, nausea and vomiting.  Genitourinary: Negative for frequency and urgency.  Musculoskeletal: Negative.  Negative for myalgias.  Neurological: Negative.  Negative for tingling, sensory change,  weakness and headaches.  Psychiatric/Behavioral: The patient has insomnia. The patient is not nervous/anxious.    As per HPI. Otherwise, a complete review of systems is negative.   PAST MEDICAL HISTORY: Past Medical History:  Diagnosis Date  . Anxiety   . Chronic kidney disease   . COPD (chronic obstructive pulmonary disease) ([place])   . Depression   . Diabetes mellitus without complication ([place])   . Hyperlipidemia   . Hypertension     PAST SURGICAL HISTORY: Past Surgical History:  Procedure Laterality Date  . EYE SURGERY    . FLEXIBLE BRONCHOSCOPY N/A 04/28/2016   Procedure: FLEXIBLE BRONCHOSCOPY;  Surgeon: [name], MD;  Location: [facility] ORS;  Service: Pulmonary;  Laterality: N/A;  . MANDIBLE SURGERY      FAMILY HISTORY: Family History  Problem Relation Age of Onset  . Diabetes Mother   . Hypertension Mother   . Breast cancer Mother   . Thrombosis Mother   . Heart disease Father     MI  . Breast cancer Sister   . Colon cancer Brother   . Lung cancer Brother     ADVANCED DIRECTIVES (Y/N):  N  HEALTH MAINTENANCE: Social History  Substance Use Topics  . Smoking status: Former Smoker    Years: 20.00    Quit date: 04/22/2014  . Smokeless tobacco: Never Used  . Alcohol use No     Colonoscopy:  PAP:  Bone density:  Lipid panel:  No Known Allergies  Current Outpatient Prescriptions  Medication Sig Dispense Refill  . amLODipine (NORVASC) 5 MG tablet Take 1 tablet (5 mg total) by mouth daily. 90 tablet 3  . aspirin EC 81 MG tablet Take 81 mg by  mouth daily.    Marland Kitchen atenolol (TENORMIN) 100 MG tablet TAKE ONE TABLET BY MOUTH ONCE DAILY 90 tablet 0  . atorvastatin (LIPITOR) 10 MG tablet Take 10 mg by mouth daily.    Marland Kitchen BYDUREON 2 MG PEN INJECT '2MG'$  SUBCUTANEOUSLY WEEKLY 4 each 2  . Insulin Glargine (TOUJEO SOLOSTAR) 300 UNIT/ML SOPN Inject 30 Units as directed daily. 4 pen 6  . lisinopril (PRINIVIL,ZESTRIL) 40 MG tablet Take 40 mg by mouth daily.    Marland Kitchen LORazepam  (ATIVAN) 0.5 MG tablet Take 1 tablet (0.5 mg total) by mouth 2 (two) times daily. 60 tablet 0  . methylPREDNISolone (MEDROL DOSEPAK) 4 MG TBPK tablet As directed 21 tablet 0  . ondansetron (ZOFRAN) 8 MG tablet Take 1 tablet (8 mg total) by mouth 2 (two) times daily as needed for refractory nausea / vomiting. 30 tablet 1  . pantoprazole (PROTONIX) 40 MG tablet Take 1 tablet (40 mg total) by mouth daily. 90 tablet 3  . prochlorperazine (COMPAZINE) 10 MG tablet Take 1 tablet (10 mg total) by mouth every 6 (six) hours as needed (Nausea or vomiting). 30 tablet 1  . ranitidine (ZANTAC) 150 MG tablet Take 150 mg by mouth daily as needed for heartburn.     . umeclidinium-vilanterol (ANORO ELLIPTA) 62.5-25 MCG/INH AEPB Inhale 1 puff into the lungs daily. 1 each 12  . zolpidem (AMBIEN) 5 MG tablet Take 1 tablet (5 mg total) by mouth at bedtime as needed for sleep. 30 tablet 1   No current facility-administered medications for this visit.     OBJECTIVE: There were no vitals filed for this visit.   There is no height or weight on file to calculate BMI.    ECOG FS:0 - Asymptomatic  General: Well-developed, well-nourished, no acute distress. Eyes: Pink conjunctiva, anicteric sclera. Lungs: Clear to auscultation bilaterally. Heart: Regular rate and rhythm. No rubs, murmurs, or gallops. Abdomen: Soft, nontender, nondistended. No organomegaly noted, normoactive bowel sounds. Musculoskeletal: No edema, cyanosis, or clubbing. Neuro: Alert, answering all questions appropriately. Cranial nerves grossly intact. Skin: No rashes or petechiae noted. Psych: Normal affect.   LAB RESULTS:  Lab Results  Component Value Date   NA 140 07/17/2016   K 5.6 (H) 07/17/2016   CL 106 07/17/2016   CO2 30 07/17/2016   GLUCOSE 140 (H) 07/17/2016   BUN 29 (H) 07/17/2016   CREATININE 1.60 (H) 07/17/2016   CALCIUM 9.6 07/17/2016   PROT 7.3 07/17/2016   ALBUMIN 3.4 (L) 07/17/2016   AST 19 07/17/2016   ALT 15 (L)  07/17/2016   ALKPHOS 61 07/17/2016   BILITOT 0.4 07/17/2016   GFRNONAA 44 (L) 07/17/2016   GFRAA 51 (L) 07/17/2016    Lab Results  Component Value Date   WBC 7.9 07/17/2016   NEUTROABS 6.0 07/17/2016   HGB 11.2 (L) 07/17/2016   HCT 33.9 (L) 07/17/2016   MCV 86.0 07/17/2016   PLT 299 07/17/2016     STUDIES: No results found.  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 Grayland Ormond. Biopsy confirmed squamous cell carcinoma. MRI the brain noted 2 mm focus concerning for metastatic disease, but currently too small to characterize. Initially will treat patient as stage III disease and monitor his isolated metastasis in his left pubic bone.  Proceed with cycle 7 weekly carboplatinum and Taxol along with concurrent XRT boost. Return to clinic in 1 week for continuation of XRT boost as well and cycle 8 of carboplatinum and Taxol. At the completion of patient's XRT he will benefit from 2 doses of consolidation chemotherapy.  2. Renal insufficiency: Patient's creatinine is WNL today. BUN slightly elevated. Monitor.  3. Brain lesion: 2 mm focus concerning for metastatic disease, but too small to characterize. Consider repeat brain imaging in 3 month in January 2018. 4. Hyperglycemia: Blood glucose slightly elevated today. Monitor since patient is receiving dexamethasone with his treatments.  5. Insomnia: Double dose to '10mg'$  Ambien at night. 6. Constipation: Start taking miralax daily. 7. Fatigue: Activities to tolerance. Monitor.  Patient expressed understanding and was in agreement with this plan. He also understands that He can call clinic at any time with any questions, concerns, or complaints.   Primary cancer of right upper lobe of lung [facility])   Staging form: Lung, AJCC 7th  Edition   - Clinical stage from 05/09/2016: Stage IV (T3, N2, M1b) - Signed by Lloyd Huger, MD on 05/09/2016   Lucendia Herrlich, NP 07/17/16 0945  Patient was seen and evaluated independently and I agree with the assessment and plan as dictated above. Patient has a mild hyperkalemia today. Monitor closely and repeat laboratory work next week.  Lloyd Huger, MD 07/23/16 11:20 PM

## 2016-07-24 ENCOUNTER — Inpatient Hospital Stay: Payer: BLUE CROSS/BLUE SHIELD

## 2016-07-24 ENCOUNTER — Inpatient Hospital Stay: Payer: BLUE CROSS/BLUE SHIELD | Admitting: Oncology

## 2016-07-24 ENCOUNTER — Ambulatory Visit
Admission: RE | Admit: 2016-07-24 | Discharge: 2016-07-24 | Disposition: A | Discharge status: Home / Self Care | Payer: BLUE CROSS/BLUE SHIELD | Source: Ambulatory Visit | Attending: Radiation Oncology | Admitting: Radiation Oncology

## 2016-07-24 ENCOUNTER — Inpatient Hospital Stay: Payer: BLUE CROSS/BLUE SHIELD | Attending: Oncology | Admitting: Oncology

## 2016-07-24 VITALS — BP 127/79 | HR 91 | Temp 98.5°F | Resp 18 | Wt 162.0 lb

## 2016-07-24 DIAGNOSIS — Z7982 Long term (current) use of aspirin: Secondary | ICD-10-CM | POA: Diagnosis not present

## 2016-07-24 DIAGNOSIS — E1165 Type 2 diabetes mellitus with hyperglycemia: Secondary | ICD-10-CM | POA: Diagnosis not present

## 2016-07-24 DIAGNOSIS — G939 Disorder of brain, unspecified: Secondary | ICD-10-CM | POA: Insufficient documentation

## 2016-07-24 DIAGNOSIS — Z794 Long term (current) use of insulin: Secondary | ICD-10-CM | POA: Insufficient documentation

## 2016-07-24 DIAGNOSIS — Z87891 Personal history of nicotine dependence: Secondary | ICD-10-CM | POA: Diagnosis not present

## 2016-07-24 DIAGNOSIS — N189 Chronic kidney disease, unspecified: Secondary | ICD-10-CM | POA: Diagnosis not present

## 2016-07-24 DIAGNOSIS — C7951 Secondary malignant neoplasm of bone: Secondary | ICD-10-CM | POA: Diagnosis not present

## 2016-07-24 DIAGNOSIS — J449 Chronic obstructive pulmonary disease, unspecified: Secondary | ICD-10-CM | POA: Insufficient documentation

## 2016-07-24 DIAGNOSIS — I129 Hypertensive chronic kidney disease with stage 1 through stage 4 chronic kidney disease, or unspecified chronic kidney disease: Secondary | ICD-10-CM | POA: Diagnosis not present

## 2016-07-24 DIAGNOSIS — E785 Hyperlipidemia, unspecified: Secondary | ICD-10-CM | POA: Diagnosis not present

## 2016-07-24 DIAGNOSIS — K5900 Constipation, unspecified: Secondary | ICD-10-CM | POA: Insufficient documentation

## 2016-07-24 DIAGNOSIS — F419 Anxiety disorder, unspecified: Secondary | ICD-10-CM | POA: Insufficient documentation

## 2016-07-24 DIAGNOSIS — Z923 Personal history of irradiation: Secondary | ICD-10-CM | POA: Insufficient documentation

## 2016-07-24 DIAGNOSIS — Z79899 Other long term (current) drug therapy: Secondary | ICD-10-CM | POA: Diagnosis not present

## 2016-07-24 DIAGNOSIS — F329 Major depressive disorder, single episode, unspecified: Secondary | ICD-10-CM | POA: Diagnosis not present

## 2016-07-24 DIAGNOSIS — C3411 Malignant neoplasm of upper lobe, right bronchus or lung: Secondary | ICD-10-CM | POA: Diagnosis not present

## 2016-07-24 DIAGNOSIS — G4700 Insomnia, unspecified: Secondary | ICD-10-CM | POA: Insufficient documentation

## 2016-07-24 LAB — CBC WITH DIFFERENTIAL/PLATELET
BASOS ABS: 0 10*3/uL (ref 0–0.1)
Basophils Relative: 1 %
EOS PCT: 1 %
Eosinophils Absolute: 0 10*3/uL (ref 0–0.7)
HCT: 34.3 % — ABNORMAL LOW (ref 40.0–52.0)
Hemoglobin: 11.3 g/dL — ABNORMAL LOW (ref 13.0–18.0)
LYMPHS PCT: 7 %
Lymphs Abs: 0.4 10*3/uL — ABNORMAL LOW (ref 1.0–3.6)
MCH: 28.4 pg (ref 26.0–34.0)
MCHC: 32.9 g/dL (ref 32.0–36.0)
MCV: 86.4 fL (ref 80.0–100.0)
Monocytes Absolute: 0.7 10*3/uL (ref 0.2–1.0)
Monocytes Relative: 10 %
Neutro Abs: 5.5 10*3/uL (ref 1.4–6.5)
Neutrophils Relative %: 81 %
Platelets: 277 10*3/uL (ref 150–440)
RBC: 3.96 MIL/uL — ABNORMAL LOW (ref 4.40–5.90)
RDW: 21.4 % — AB (ref 11.5–14.5)
WBC: 6.7 10*3/uL (ref 3.8–10.6)

## 2016-07-24 LAB — COMPREHENSIVE METABOLIC PANEL
ALBUMIN: 3.7 g/dL (ref 3.5–5.0)
ALT: 15 U/L — ABNORMAL LOW (ref 17–63)
ANION GAP: 7 (ref 5–15)
AST: 14 U/L — AB (ref 15–41)
Alkaline Phosphatase: 59 U/L (ref 38–126)
BUN: 31 mg/dL — AB (ref 6–20)
CO2: 23 mmol/L (ref 22–32)
Calcium: 9.3 mg/dL (ref 8.9–10.3)
Chloride: 105 mmol/L (ref 101–111)
Creatinine, Ser: 1.7 mg/dL — ABNORMAL HIGH (ref 0.61–1.24)
GFR calc Af Amer: 47 mL/min — ABNORMAL LOW (ref >60)
GFR calc non Af Amer: 41 mL/min — ABNORMAL LOW (ref >60)
GLUCOSE: 161 mg/dL — AB (ref 65–99)
POTASSIUM: 4.5 mmol/L (ref 3.5–5.1)
SODIUM: 135 mmol/L (ref 135–145)
Total Bilirubin: 0.3 mg/dL (ref 0.3–1.2)
Total Protein: 7.4 g/dL (ref 6.5–8.1)

## 2016-07-24 MED ORDER — OXYCODONE-ACETAMINOPHEN 5-325 MG PO TABS: 1.0000 | ORAL_TABLET | Freq: Four times a day (QID) | ORAL | 0 refills | Status: DC | PRN

## 2016-07-24 NOTE — Progress Notes (Signed)
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  INTERVAL HISTORY: Patient returns to clinic today for further evaluation and consideration of cycle 8 of weekly carboplatinum and Taxol.  He continues to tolerate his treatments well.  He currently feels well and is asymptomatic. He does complain of continuing mild fatigue, but it does not interfering with his daily activities. He has a good appetite and denies weight loss. He denies any chest pain or shortness of breath. He denies any nausea, vomiting, constipation, or diarrhea. He has no urinary complaints. Patient offers no further specific complaints.  REVIEW OF SYSTEMS:   Review of Systems  Constitutional: Positive for malaise/fatigue. Negative for chills, fever and weight loss.  HENT: Negative for congestion and hearing loss.   Eyes: Negative for blurred vision.  Respiratory: Negative for cough, hemoptysis and shortness of breath.   Cardiovascular: Negative.  Negative for chest pain and leg swelling.  Gastrointestinal: Positive for constipation. Negative for abdominal pain, diarrhea, nausea and vomiting.  Genitourinary: Negative for frequency and urgency.  Musculoskeletal: Negative.  Negative for myalgias.  Neurological: Negative.  Negative for tingling, sensory change, weakness and headaches.  Psychiatric/Behavioral: The patient has insomnia. The patient is not nervous/anxious.    As per HPI. Otherwise, a complete review of systems is negative.   PAST MEDICAL HISTORY: Past Medical History:  Diagnosis Date  . Anxiety   . Chronic kidney disease   . COPD (chronic obstructive pulmonary disease) ([place])   . Depression   . Diabetes mellitus  without complication ([place])   . Hyperlipidemia   . Hypertension     PAST SURGICAL HISTORY: Past Surgical History:  Procedure Laterality Date  . EYE SURGERY    . FLEXIBLE BRONCHOSCOPY N/A 04/28/2016   Procedure: FLEXIBLE BRONCHOSCOPY;  Surgeon: [name], MD;  Location: [facility] ORS;  Service: Pulmonary;  Laterality: N/A;  . MANDIBLE SURGERY      FAMILY HISTORY: Family History  Problem Relation Age of Onset  . Diabetes Mother   . Hypertension Mother   . Breast cancer Mother   . Thrombosis Mother   . Heart disease Father     MI  . Breast cancer Sister   . Colon cancer Brother   . Lung cancer Brother     ADVANCED DIRECTIVES (Y/N):  N  HEALTH MAINTENANCE: Social History  Substance Use Topics  . Smoking status: Former Smoker    Years: 20.00    Quit date: 04/22/2014  . Smokeless tobacco: Never Used  . Alcohol use No     Colonoscopy:  PAP:  Bone density:  Lipid panel:  No Known Allergies  Current Outpatient Prescriptions  Medication Sig Dispense Refill  . amLODipine (NORVASC) 5 MG tablet Take 1 tablet (5 mg total) by mouth daily. 90 tablet 3  . aspirin EC 81 MG tablet Take 81 mg by mouth daily.    Marland Kitchen atenolol (TENORMIN) 100 MG tablet TAKE ONE TABLET BY MOUTH ONCE DAILY 90 tablet 0  . atorvastatin (LIPITOR) 10 MG tablet Take 10 mg by mouth daily.    Marland Kitchen BYDUREON 2 MG PEN INJECT '2MG'$  SUBCUTANEOUSLY WEEKLY 4 each 2  . Insulin Glargine (TOUJEO SOLOSTAR) 300 UNIT/ML SOPN Inject 30 Units as directed daily. 4 pen 6  .  lisinopril (PRINIVIL,ZESTRIL) 40 MG tablet Take 40 mg by mouth daily.    Marland Kitchen LORazepam (ATIVAN) 0.5 MG tablet Take 1 tablet (0.5 mg total) by mouth 2 (two) times daily. 60 tablet 0  . methylPREDNISolone (MEDROL DOSEPAK) 4 MG TBPK tablet As directed 21 tablet 0  . ondansetron (ZOFRAN) 8 MG tablet Take 1 tablet (8 mg total) by mouth 2 (two) times daily as needed for refractory nausea / vomiting. 30 tablet 1  . oxyCODONE-acetaminophen (PERCOCET/ROXICET) 5-325 MG  tablet Take 1-2 tablets by mouth every 6 (six) hours as needed for severe pain. 60 tablet 0  . pantoprazole (PROTONIX) 40 MG tablet Take 1 tablet (40 mg total) by mouth daily. 90 tablet 3  . prochlorperazine (COMPAZINE) 10 MG tablet Take 1 tablet (10 mg total) by mouth every 6 (six) hours as needed (Nausea or vomiting). 30 tablet 1  . ranitidine (ZANTAC) 150 MG tablet Take 150 mg by mouth daily as needed for heartburn.     . umeclidinium-vilanterol (ANORO ELLIPTA) 62.5-25 MCG/INH AEPB Inhale 1 puff into the lungs daily. 1 each 12  . zolpidem (AMBIEN) 5 MG tablet Take 1 tablet (5 mg total) by mouth at bedtime as needed for sleep. 30 tablet 1   No current facility-administered medications for this visit.     OBJECTIVE: Vitals:   07/24/16 1336  BP: 127/79  Pulse: 91  Resp: 18  Temp: 98.5 F (36.9 C)     Body mass index is 23.25 kg/m.    ECOG FS:0 - Asymptomatic  General: Well-developed, well-nourished, no acute distress. Eyes: Pink conjunctiva, anicteric sclera. Lungs: Clear to auscultation bilaterally. Heart: Regular rate and rhythm. No rubs, murmurs, or gallops. Abdomen: Soft, nontender, nondistended. No organomegaly noted, normoactive bowel sounds. Musculoskeletal: No edema, cyanosis, or clubbing. Neuro: Alert, answering all questions appropriately. Cranial nerves grossly intact. Skin: No rashes or petechiae noted. Psych: Normal affect.   LAB RESULTS:  Lab Results  Component Value Date   NA 135 07/24/2016   K 4.5 07/24/2016   CL 105 07/24/2016   CO2 23 07/24/2016   GLUCOSE 161 (H) 07/24/2016   BUN 31 (H) 07/24/2016   CREATININE 1.70 (H) 07/24/2016   CALCIUM 9.3 07/24/2016   PROT 7.4 07/24/2016   ALBUMIN 3.7 07/24/2016   AST 14 (L) 07/24/2016   ALT 15 (L) 07/24/2016   ALKPHOS 59 07/24/2016   BILITOT 0.3 07/24/2016   GFRNONAA 41 (L) 07/24/2016   GFRAA 47 (L) 07/24/2016    Lab Results  Component Value Date   WBC 6.7 07/24/2016   NEUTROABS 5.5 07/24/2016   HGB  11.3 (L) 07/24/2016   HCT 34.3 (L) 07/24/2016   MCV 86.4 07/24/2016   PLT 277 07/24/2016     STUDIES: No results found.  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 Initially will treat patient as stage III disease and monitor his isolated metastasis in his left pubic bone. Proceed with cycle 8 weekly carboplatinum and Taxol along with concurrent XRT boost. Patient completed XRT today. Return to clinic in 3 weeks for consideration of cycle 1 of 2 of consolidation treatment. Plan to reimage approximately 6-8 weeks after the completion of chemotherapy.  2. Renal insufficiency:  Mild, monitor.  3. Brain lesion: 2 mm focus concerning for metastatic disease, but too small to characterize. Consider repeat brain imaging in 3 month in January 2018. 4. Hyperglycemia: Blood glucose slightly elevated today. Monitor since patient is receiving dexamethasone with his treatments.  5. Insomnia: Continue Ambien as needed. 6. Constipation: Continue MiraLAX.  Patient expressed understanding and was in agreement with this plan. He also understands that He can call clinic at any time with any questions, concerns, or complaints.   Primary cancer of right upper lobe of lung [facility])   Staging form: Lung, AJCC 7th Edition   - Clinical stage from 05/09/2016: Stage IV (T3, N2, M1b) - Signed by Lloyd Huger, MD on 05/09/2016    Lloyd Huger, MD 07/24/16 2:29 PM

## 2016-07-24 NOTE — Progress Notes (Signed)
Complains of shoulder pain and requests refill of percocet. Feeling well today.

## 2016-07-25 ENCOUNTER — Inpatient Hospital Stay: Payer: BLUE CROSS/BLUE SHIELD

## 2016-07-25 VITALS — BP 109/74 | HR 104 | Temp 97.6°F | Resp 18

## 2016-07-25 DIAGNOSIS — C3411 Malignant neoplasm of upper lobe, right bronchus or lung: Secondary | ICD-10-CM

## 2016-07-25 MED ORDER — FAMOTIDINE IN NACL 20-0.9 MG/50ML-% IV SOLN
20.0000 mg | Freq: Once | INTRAVENOUS | Status: AC
  Administered 2016-07-25: 20 mg via INTRAVENOUS
  Filled 2016-07-25: qty 50

## 2016-07-25 MED ORDER — DEXAMETHASONE SODIUM PHOSPHATE 10 MG/ML IJ SOLN
10.0000 mg | Freq: Once | INTRAMUSCULAR | Status: AC
  Administered 2016-07-25: 10 mg via INTRAVENOUS
  Filled 2016-07-25: qty 1

## 2016-07-25 MED ORDER — SODIUM CHLORIDE 0.9 % IV SOLN
Freq: Once | INTRAVENOUS | Status: AC
  Administered 2016-07-25: 10:00:00 via INTRAVENOUS
  Filled 2016-07-25: qty 1000

## 2016-07-25 MED ORDER — PALONOSETRON HCL INJECTION 0.25 MG/5ML
0.2500 mg | Freq: Once | INTRAVENOUS | Status: AC
  Administered 2016-07-25: 0.25 mg via INTRAVENOUS
  Filled 2016-07-25: qty 5

## 2016-07-25 MED ORDER — PACLITAXEL CHEMO INJECTION 300 MG/50ML
45.0000 mg/m2 | Freq: Once | INTRAVENOUS | Status: AC
  Administered 2016-07-25: 84 mg via INTRAVENOUS
  Filled 2016-07-25: qty 14

## 2016-07-25 MED ORDER — SODIUM CHLORIDE 0.9 % IV SOLN: 10.0000 mg | Freq: Once | INTRAVENOUS | Status: DC

## 2016-07-25 MED ORDER — CARBOPLATIN CHEMO INJECTION 450 MG/45ML
138.8000 mg | Freq: Once | INTRAVENOUS | Status: AC
  Administered 2016-07-25: 140 mg via INTRAVENOUS
  Filled 2016-07-25: qty 14

## 2016-07-31 ENCOUNTER — Ambulatory Visit: Payer: BLUE CROSS/BLUE SHIELD | Admitting: Oncology

## 2016-07-31 ENCOUNTER — Other Ambulatory Visit: Payer: BLUE CROSS/BLUE SHIELD

## 2016-07-31 ENCOUNTER — Ambulatory Visit: Payer: BLUE CROSS/BLUE SHIELD

## 2016-08-12 ENCOUNTER — Other Ambulatory Visit: Payer: Self-pay | Admitting: Unknown Physician Specialty

## 2016-08-12 MED ORDER — METHYLPREDNISOLONE 4 MG PO TBPK: ORAL_TABLET | ORAL | 0 refills | Status: DC

## 2016-08-13 ENCOUNTER — Other Ambulatory Visit: Payer: Self-pay | Admitting: Oncology

## 2016-08-13 DIAGNOSIS — C3411 Malignant neoplasm of upper lobe, right bronchus or lung: Secondary | ICD-10-CM

## 2016-08-13 NOTE — Progress Notes (Signed)
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  INTERVAL HISTORY: Patient returns to clinic today for further evaluation and consideration of cycle 1 of 2 of carboplatin and paclitaxel consolidation treatment.  He continues to tolerate his treatments well.  He currently feels well and is asymptomatic. He does complain of continuing mild intermittent fatigue, but it is not interfering with his daily activities.  He denies any weakness.  He denies any fever, chills, or recent illnesses.  He has a good appetite and denies weight loss.  He reports mild shortness of breath with exertion.  He denies any chest pain.  He reports occasional constipation, relieved with miralax.  He denies any nausea, vomiting, diarrhea, hematuria, or hematochezia.  He has no urinary complaints.  He complains of 5/10 left rear hip pain, attributing it to sleeping "wrong".  He reports improving right foot pain after taking steroids prescribed by PCP.  Patient offers no further specific complaints.  REVIEW OF SYSTEMS:   Review of Systems  Constitutional: Positive for malaise/fatigue. Negative for chills, fever and weight loss.  HENT: Negative for congestion and hearing loss.   Eyes: Negative for blurred vision.  Respiratory: Positive for shortness of breath. Negative for cough and hemoptysis.   Cardiovascular: Negative.  Negative for chest pain and leg swelling.  Gastrointestinal: Positive for constipation. Negative for abdominal pain, blood in stool, diarrhea, nausea and vomiting.  Genitourinary: Negative.  Negative for frequency, hematuria and urgency.  Musculoskeletal: Positive for joint pain. Negative for myalgias.  Neurological:  Negative.  Negative for tingling, sensory change, weakness and headaches.  Psychiatric/Behavioral: The patient has insomnia. The patient is not nervous/anxious.    As per HPI. Otherwise, a complete review of systems is negative.   PAST MEDICAL HISTORY: Past Medical History:  Diagnosis Date  . Anxiety   . Chronic kidney disease   . COPD (chronic obstructive pulmonary disease) ([place])   . Depression   . Diabetes mellitus without complication ([place])   . Hyperlipidemia   . Hypertension     PAST SURGICAL HISTORY: Past Surgical History:  Procedure Laterality Date  . EYE SURGERY    . FLEXIBLE BRONCHOSCOPY N/A 04/28/2016   Procedure: FLEXIBLE BRONCHOSCOPY;  Surgeon: [name], MD;  Location: [facility] ORS;  Service: Pulmonary;  Laterality: N/A;  . MANDIBLE SURGERY      FAMILY HISTORY: Family History  Problem Relation Age of Onset  . Diabetes Mother   . Hypertension Mother   . Breast cancer Mother   . Thrombosis Mother   . Heart disease Father     MI  . Breast cancer Sister   . Colon cancer Brother   . Lung cancer Brother     ADVANCED DIRECTIVES (Y/N):  N  HEALTH MAINTENANCE: Social History  Substance Use Topics  . Smoking status: Former Smoker    Years: 20.00    Quit date: 04/22/2014  . Smokeless tobacco: Never Used  . Alcohol use No     Colonoscopy:  PAP:  Bone density:  Lipid panel:  No Known Allergies  Current Outpatient Prescriptions  Medication Sig Dispense Refill  . amLODipine (NORVASC) 5 MG tablet Take 1 tablet (5 mg total) by mouth daily. 90 tablet 3  . aspirin EC  81 MG tablet Take 81 mg by mouth daily.    Marland Kitchen atenolol (TENORMIN) 100 MG tablet TAKE ONE TABLET BY MOUTH ONCE DAILY 90 tablet 0  . atorvastatin (LIPITOR) 10 MG tablet Take 10 mg by mouth daily.    Marland Kitchen BYDUREON 2 MG PEN INJECT 2MG SUBCUTANEOUSLY WEEKLY 4 each 2  . Insulin Glargine (TOUJEO SOLOSTAR) 300 UNIT/ML SOPN Inject 30 Units as directed daily. 4 pen 6  . lisinopril (PRINIVIL,ZESTRIL) 40 MG  tablet Take 40 mg by mouth daily.    Marland Kitchen LORazepam (ATIVAN) 0.5 MG tablet Take 1 tablet (0.5 mg total) by mouth 2 (two) times daily. 60 tablet 0  . methylPREDNISolone (MEDROL DOSEPAK) 4 MG TBPK tablet As directed 21 tablet 0  . ondansetron (ZOFRAN) 8 MG tablet Take 1 tablet by mouth 2 (two) times daily as needed.    Marland Kitchen oxyCODONE-acetaminophen (PERCOCET/ROXICET) 5-325 MG tablet Take 1-2 tablets by mouth every 6 (six) hours as needed for severe pain. 60 tablet 0  . pantoprazole (PROTONIX) 40 MG tablet Take 1 tablet (40 mg total) by mouth daily. 90 tablet 3  . prochlorperazine (COMPAZINE) 10 MG tablet Take 1 tablet (10 mg total) by mouth every 6 (six) hours as needed (Nausea or vomiting). 30 tablet 1  . ranitidine (ZANTAC) 150 MG tablet Take 150 mg by mouth daily as needed for heartburn.     . umeclidinium-vilanterol (ANORO ELLIPTA) 62.5-25 MCG/INH AEPB Inhale 1 puff into the lungs daily. 1 each 12  . zolpidem (AMBIEN) 5 MG tablet Take 1 tablet (5 mg total) by mouth at bedtime as needed for sleep. 30 tablet 1   No current facility-administered medications for this visit.    Facility-Administered Medications Ordered in Other Visits  Medication Dose Route Frequency Provider Last Rate Last Dose  . CARBOplatin (PARAPLATIN) 380 mg in sodium chloride 0.9 % 250 mL chemo infusion  380 mg Intravenous Once Lloyd Huger, MD      . PACLitaxel (TAXOL) 336 mg in sodium chloride 0.9 % 500 mL chemo infusion (> 81m/m2)  175 mg/m2 (Treatment Plan Recorded) Intravenous Once TLloyd Huger MD 185 mL/hr at 08/14/16 1029 336 mg at 08/14/16 1029  . pegfilgrastim (NEULASTA ONPRO KIT) injection 6 mg  6 mg Subcutaneous Once TLloyd Huger MD        OBJECTIVE: Vitals:   08/14/16 0917  BP: 122/77  Pulse: 93  Resp: 18  Temp: (!) 96.9 F (36.1 C)     Body mass index is 23.88 kg/m.    ECOG FS:0 - Asymptomatic  General: Well-developed, well-nourished, no acute distress. Eyes: Pink conjunctiva, anicteric  sclera. Lungs: Clear to auscultation bilaterally. Heart: Regular rate and rhythm. No rubs, murmurs, or gallops. Abdomen: Soft, nontender, nondistended. No organomegaly noted, normoactive bowel sounds. Musculoskeletal: No edema, cyanosis, or clubbing. Neuro: Alert, answering all questions appropriately. Cranial nerves grossly intact. Skin: No rashes or petechiae noted. Psych: Normal affect.   LAB RESULTS:  Lab Results  Component Value Date   NA 136 08/14/2016   K 4.9 08/14/2016   CL 103 08/14/2016   CO2 25 08/14/2016   GLUCOSE 208 (H) 08/14/2016   BUN 32 (H) 08/14/2016   CREATININE 1.49 (H) 08/14/2016   CALCIUM 9.7 08/14/2016   PROT 7.3 08/14/2016   ALBUMIN 3.5 08/14/2016   AST 23 08/14/2016   ALT 10 (L) 08/14/2016   ALKPHOS 57 08/14/2016   BILITOT 0.4 08/14/2016   GFRNONAA 48 (L) 08/14/2016   GFRAA 55 (L) 08/14/2016  Lab Results  Component Value Date   WBC 10.0 08/14/2016   NEUTROABS 8.6 (H) 08/14/2016   HGB 10.6 (L) 08/14/2016   HCT 30.7 (L) 08/14/2016   MCV 88.4 08/14/2016   PLT 193 08/14/2016     STUDIES: No results found.  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 Initially will treat patient as stage III disease and monitor his isolated metastasis in his left pubic bone. Patient completed XRT on 07/24/16.  Proceed with cycle 1 of 2 of consolidation carboplatinum and Taxol today. Return to clinic in 3 weeks for consideration of cycle 2 of 2 of consolidation treatment. Plan to reimage approximately 6-8 weeks after the completion of chemotherapy.  2. Renal insufficiency: Mild, improving, monitor.  3. Brain lesion: 2 mm focus concerning for metastatic disease, but too small  to characterize. Consider repeat brain imaging after completion of chemo consolidation in 3 weeks. 4. Hyperglycemia: Blood glucose elevated today. Monitor since patient is receiving dexamethasone with his treatments.  5. Insomnia: Continue Ambien as needed. 6. Constipation: Continue MiraLAX.  Patient expressed understanding and was in agreement with this plan. He also understands that He can call clinic at any time with any questions, concerns, or complaints.   Cancer Staging Primary cancer of right upper lobe of lung [facility]) Staging form: Lung, AJCC 7th Edition - Clinical stage from 05/09/2016: Stage IV (T3, N2, M1b) - Signed by Lloyd Huger, MD on 05/09/2016    Lucendia Herrlich, NP  08/14/16 1:03 PM    Patient was seen and evaluated independently and I agree with the assessment and plan as dictated above. Patient will also receive OnPro Neulasta support with his treatments.  Lloyd Huger, MD 08/17/16 1:41 PM

## 2016-08-14 ENCOUNTER — Inpatient Hospital Stay (HOSPITAL_BASED_OUTPATIENT_CLINIC_OR_DEPARTMENT_OTHER): Payer: BLUE CROSS/BLUE SHIELD | Admitting: Oncology

## 2016-08-14 ENCOUNTER — Inpatient Hospital Stay: Payer: BLUE CROSS/BLUE SHIELD

## 2016-08-14 VITALS — BP 122/77 | HR 93 | Temp 96.9°F | Resp 18 | Wt 166.4 lb

## 2016-08-14 DIAGNOSIS — G939 Disorder of brain, unspecified: Secondary | ICD-10-CM | POA: Diagnosis not present

## 2016-08-14 DIAGNOSIS — K5900 Constipation, unspecified: Secondary | ICD-10-CM | POA: Diagnosis not present

## 2016-08-14 DIAGNOSIS — C3411 Malignant neoplasm of upper lobe, right bronchus or lung: Secondary | ICD-10-CM

## 2016-08-14 DIAGNOSIS — G4700 Insomnia, unspecified: Secondary | ICD-10-CM

## 2016-08-14 DIAGNOSIS — E1165 Type 2 diabetes mellitus with hyperglycemia: Secondary | ICD-10-CM

## 2016-08-14 DIAGNOSIS — C7951 Secondary malignant neoplasm of bone: Secondary | ICD-10-CM

## 2016-08-14 DIAGNOSIS — Z79899 Other long term (current) drug therapy: Secondary | ICD-10-CM

## 2016-08-14 DIAGNOSIS — Z923 Personal history of irradiation: Secondary | ICD-10-CM

## 2016-08-14 LAB — COMPREHENSIVE METABOLIC PANEL
ALBUMIN: 3.5 g/dL (ref 3.5–5.0)
ALK PHOS: 57 U/L (ref 38–126)
ALT: 10 U/L — AB (ref 17–63)
AST: 23 U/L (ref 15–41)
Anion gap: 8 (ref 5–15)
BILIRUBIN TOTAL: 0.4 mg/dL (ref 0.3–1.2)
BUN: 32 mg/dL — ABNORMAL HIGH (ref 6–20)
CALCIUM: 9.7 mg/dL (ref 8.9–10.3)
CO2: 25 mmol/L (ref 22–32)
CREATININE: 1.49 mg/dL — AB (ref 0.61–1.24)
Chloride: 103 mmol/L (ref 101–111)
GFR calc Af Amer: 55 mL/min — ABNORMAL LOW (ref >60)
GFR calc non Af Amer: 48 mL/min — ABNORMAL LOW (ref >60)
GLUCOSE: 208 mg/dL — AB (ref 65–99)
Potassium: 4.9 mmol/L (ref 3.5–5.1)
SODIUM: 136 mmol/L (ref 135–145)
TOTAL PROTEIN: 7.3 g/dL (ref 6.5–8.1)

## 2016-08-14 LAB — CBC WITH DIFFERENTIAL/PLATELET
BASOS PCT: 0 %
Basophils Absolute: 0 10*3/uL (ref 0–0.1)
EOS ABS: 0 10*3/uL (ref 0–0.7)
Eosinophils Relative: 0 %
HEMATOCRIT: 30.7 % — AB (ref 40.0–52.0)
HEMOGLOBIN: 10.6 g/dL — AB (ref 13.0–18.0)
LYMPHS ABS: 0.3 10*3/uL — AB (ref 1.0–3.6)
Lymphocytes Relative: 3 %
MCH: 30.6 pg (ref 26.0–34.0)
MCHC: 34.6 g/dL (ref 32.0–36.0)
MCV: 88.4 fL (ref 80.0–100.0)
MONOS PCT: 11 %
Monocytes Absolute: 1.1 10*3/uL — ABNORMAL HIGH (ref 0.2–1.0)
NEUTROS ABS: 8.6 10*3/uL — AB (ref 1.4–6.5)
NEUTROS PCT: 86 %
Platelets: 193 10*3/uL (ref 150–440)
RBC: 3.47 MIL/uL — ABNORMAL LOW (ref 4.40–5.90)
RDW: 19.6 % — ABNORMAL HIGH (ref 11.5–14.5)
WBC: 10 10*3/uL (ref 3.8–10.6)

## 2016-08-14 MED ORDER — PALONOSETRON HCL INJECTION 0.25 MG/5ML
0.2500 mg | Freq: Once | INTRAVENOUS | Status: AC
  Administered 2016-08-14: 0.25 mg via INTRAVENOUS
  Filled 2016-08-14: qty 5

## 2016-08-14 MED ORDER — SODIUM CHLORIDE 0.9 % IV SOLN
382.0000 mg | Freq: Once | INTRAVENOUS | Status: AC
  Administered 2016-08-14: 380 mg via INTRAVENOUS
  Filled 2016-08-14: qty 38

## 2016-08-14 MED ORDER — SODIUM CHLORIDE 0.9 % IV SOLN
175.0000 mg/m2 | Freq: Once | INTRAVENOUS | Status: AC
  Administered 2016-08-14: 336 mg via INTRAVENOUS
  Filled 2016-08-14: qty 56

## 2016-08-14 MED ORDER — PEGFILGRASTIM 6 MG/0.6ML ~~LOC~~ PSKT
6.0000 mg | PREFILLED_SYRINGE | Freq: Once | SUBCUTANEOUS | Status: AC
  Administered 2016-08-14: 6 mg via SUBCUTANEOUS
  Filled 2016-08-14: qty 0.6

## 2016-08-14 MED ORDER — FAMOTIDINE IN NACL 20-0.9 MG/50ML-% IV SOLN
20.0000 mg | Freq: Once | INTRAVENOUS | Status: AC
  Administered 2016-08-14: 20 mg via INTRAVENOUS
  Filled 2016-08-14: qty 50

## 2016-08-14 MED ORDER — DIPHENHYDRAMINE HCL 50 MG/ML IJ SOLN
25.0000 mg | Freq: Once | INTRAMUSCULAR | Status: AC
  Administered 2016-08-14: 25 mg via INTRAVENOUS
  Filled 2016-08-14: qty 1

## 2016-08-14 MED ORDER — SODIUM CHLORIDE 0.9 % IV SOLN: 10.0000 mg | Freq: Once | INTRAVENOUS | Status: DC

## 2016-08-14 MED ORDER — DEXAMETHASONE SODIUM PHOSPHATE 10 MG/ML IJ SOLN
10.0000 mg | Freq: Once | INTRAMUSCULAR | Status: AC
  Administered 2016-08-14: 10 mg via INTRAVENOUS
  Filled 2016-08-14: qty 1

## 2016-08-14 MED ORDER — SODIUM CHLORIDE 0.9 % IV SOLN
Freq: Once | INTRAVENOUS | Status: AC
  Administered 2016-08-14: 10:00:00 via INTRAVENOUS
  Filled 2016-08-14: qty 1000

## 2016-08-14 NOTE — Progress Notes (Signed)
Complains of left hip pain that started this morning. Pt states slept on hip wrong. Was started on steroid taper yesterday by PCP for right foot injury. Right foot is feeling better today.

## 2016-08-18 ENCOUNTER — Telehealth: Payer: Self-pay | Admitting: *Deleted

## 2016-08-18 NOTE — Telephone Encounter (Signed)
Per Dr Grayland Ormond this is most likely from the Neulasta, take Claritin and Tylenol. Left message on wife's VM

## 2016-08-18 NOTE — Telephone Encounter (Signed)
Called to report that he is having a lot of pain since chemo and inj, asking if this could be from that, he is having a hard time moving due to the pain. Please advise

## 2016-08-25 ENCOUNTER — Other Ambulatory Visit: Payer: Self-pay | Admitting: Oncology

## 2016-08-25 ENCOUNTER — Ambulatory Visit: Payer: BLUE CROSS/BLUE SHIELD | Admitting: Radiation Oncology

## 2016-08-25 ENCOUNTER — Encounter: Payer: Self-pay | Admitting: Oncology

## 2016-08-25 DIAGNOSIS — C3411 Malignant neoplasm of upper lobe, right bronchus or lung: Secondary | ICD-10-CM

## 2016-08-25 MED ORDER — OXYCODONE-ACETAMINOPHEN 5-325 MG PO TABS: 1.0000 | ORAL_TABLET | Freq: Four times a day (QID) | ORAL | 0 refills | Status: DC | PRN

## 2016-09-03 NOTE — Progress Notes (Signed)
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  INTERVAL HISTORY: Patient returns to clinic today for further evaluation and consideration of cycle 2 of 2 of carboplatin and paclitaxel consolidation treatment.  He continues to tolerate his treatments well. He is having significant back pain that is radiating down his left leg consistent with his known sciatica. He otherwise feels well. He does not complain of weakness or fatigue today. He denies any fever, chills, or recent illnesses.  He has a good appetite and denies weight loss.  He reports mild shortness of breath with exertion.  He denies any chest pain.  He reports occasional constipation, relieved with miralax.  He denies any nausea, vomiting, diarrhea, hematuria, or hematochezia.  He has no urinary complaints. Patient offers no further specific complaints.  REVIEW OF SYSTEMS:   Review of Systems  Constitutional: Negative for chills, fever, malaise/fatigue and weight loss.  HENT: Negative for congestion and hearing loss.   Respiratory: Positive for shortness of breath. Negative for cough and hemoptysis.   Cardiovascular: Negative.  Negative for chest pain and leg swelling.  Gastrointestinal: Positive for constipation. Negative for abdominal pain, blood in stool, diarrhea, nausea and vomiting.  Genitourinary: Negative.  Negative for frequency, hematuria and urgency.  Musculoskeletal: Positive for back pain and joint pain. Negative for myalgias.  Neurological: Negative.  Negative for tingling, sensory change, weakness and headaches.  Psychiatric/Behavioral: The patient is not nervous/anxious and does not have insomnia.    As per HPI. Otherwise, a  complete review of systems is negative.   PAST MEDICAL HISTORY: Past Medical History:  Diagnosis Date  . Anxiety   . Chronic kidney disease   . COPD (chronic obstructive pulmonary disease) ([place])   . Depression   . Diabetes mellitus without complication ([place])   . Hyperlipidemia   . Hypertension     PAST SURGICAL HISTORY: Past Surgical History:  Procedure Laterality Date  . EYE SURGERY    . FLEXIBLE BRONCHOSCOPY N/A 04/28/2016   Procedure: FLEXIBLE BRONCHOSCOPY;  Surgeon: [name], MD;  Location: [facility] ORS;  Service: Pulmonary;  Laterality: N/A;  . MANDIBLE SURGERY      FAMILY HISTORY: Family History  Problem Relation Age of Onset  . Diabetes Mother   . Hypertension Mother   . Breast cancer Mother   . Thrombosis Mother   . Heart disease Father     MI  . Breast cancer Sister   . Colon cancer Brother   . Lung cancer Brother     ADVANCED DIRECTIVES (Y/N):  N  HEALTH MAINTENANCE: Social History  Substance Use Topics  . Smoking status: Former Smoker    Years: 20.00    Quit date: 04/22/2014  . Smokeless tobacco: Never Used  . Alcohol use No     Colonoscopy:  PAP:  Bone density:  Lipid panel:  No Known Allergies  Current Outpatient Prescriptions  Medication Sig Dispense Refill  . amLODipine (NORVASC) 5 MG tablet Take 1 tablet (5 mg total) by mouth daily. 90 tablet 3  . aspirin EC 81 MG tablet Take 81 mg by mouth daily.    Marland Kitchen atenolol (TENORMIN) 100 MG tablet TAKE ONE TABLET BY MOUTH ONCE DAILY 90 tablet 0  . atorvastatin (LIPITOR)  10 MG tablet Take 10 mg by mouth daily.    Marland Kitchen BYDUREON 2 MG PEN INJECT '2MG'$  SUBCUTANEOUSLY WEEKLY 4 each 2  . Insulin Glargine (TOUJEO SOLOSTAR) 300 UNIT/ML SOPN Inject 30 Units as directed daily. 4 pen 6  . lisinopril (PRINIVIL,ZESTRIL) 40 MG tablet Take 40 mg by mouth daily.    Marland Kitchen LORazepam (ATIVAN) 0.5 MG tablet Take 1 tablet (0.5 mg total) by mouth 2 (two) times daily. 60 tablet 0  . ondansetron (ZOFRAN) 8 MG tablet Take 1  tablet by mouth 2 (two) times daily as needed.    Marland Kitchen oxyCODONE-acetaminophen (PERCOCET/ROXICET) 5-325 MG tablet Take 1-2 tablets by mouth every 6 (six) hours as needed for severe pain. 60 tablet 0  . pantoprazole (PROTONIX) 40 MG tablet Take 1 tablet (40 mg total) by mouth daily. 90 tablet 3  . prochlorperazine (COMPAZINE) 10 MG tablet Take 1 tablet (10 mg total) by mouth every 6 (six) hours as needed (Nausea or vomiting). 30 tablet 1  . ranitidine (ZANTAC) 150 MG tablet Take 150 mg by mouth daily as needed for heartburn.     . zolpidem (AMBIEN) 5 MG tablet Take 1 tablet (5 mg total) by mouth at bedtime as needed for sleep. 30 tablet 1  . umeclidinium-vilanterol (ANORO ELLIPTA) 62.5-25 MCG/INH AEPB Inhale 1 puff into the lungs daily. (Patient not taking: Reported on 09/04/2016) 1 each 12   No current facility-administered medications for this visit.     OBJECTIVE: Vitals:   09/04/16 0924  BP: 99/73  Pulse: 97  Resp: 18  Temp: 97.9 F (36.6 C)     Body mass index is 23.04 kg/m.    ECOG FS:0 - Asymptomatic  General: Well-developed, well-nourished, no acute distress. Eyes: Pink conjunctiva, anicteric sclera. Lungs: Clear to auscultation bilaterally. Heart: Regular rate and rhythm. No rubs, murmurs, or gallops. Abdomen: Soft, nontender, nondistended. No organomegaly noted, normoactive bowel sounds. Musculoskeletal: No edema, cyanosis, or clubbing. Neuro: Alert, answering all questions appropriately. Cranial nerves grossly intact. Skin: No rashes or petechiae noted. Psych: Normal affect.   LAB RESULTS:  Lab Results  Component Value Date   NA 134 (L) 09/04/2016   K 4.3 09/04/2016   CL 101 09/04/2016   CO2 26 09/04/2016   GLUCOSE 184 (H) 09/04/2016   BUN 19 09/04/2016   CREATININE 1.50 (H) 09/04/2016   CALCIUM 10.0 09/04/2016   PROT 7.5 09/04/2016   ALBUMIN 3.8 09/04/2016   AST 15 09/04/2016   ALT 10 (L) 09/04/2016   ALKPHOS 77 09/04/2016   BILITOT 0.6 09/04/2016   GFRNONAA  47 (L) 09/04/2016   GFRAA 55 (L) 09/04/2016    Lab Results  Component Value Date   WBC 12.1 (H) 09/04/2016   NEUTROABS 9.8 (H) 09/04/2016   HGB 11.2 (L) 09/04/2016   HCT 33.2 (L) 09/04/2016   MCV 90.3 09/04/2016   PLT 371 09/04/2016     STUDIES: No results found.  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 Initially will treat patient as stage III disease and monitor his isolated metastasis in his left pubic bone. Patient completed XRT July 24, 2016  Proceed with cycle 2 of 2 of consolidation carboplatinum and Taxol today. Return  to clinic in 8 weeks for repeat PET scan and further evaluation. We will also repeat MRI the brain at that time.   2. Renal insufficiency: Mild, improving, monitor.  3. Brain lesion: 2 mm focus concerning for metastatic disease, but too small to characterize. Repeat brain imaging as above.  4. Hyperglycemia: Blood glucose elevated today. Monitor since patient is receiving dexamethasone with his treatments.  5. Insomnia: Continue Ambien as needed. 6. Constipation: Continue MiraLAX. 7. Back pain: Continue evaluation and treatment per PCP. If no improvement, patient may benefit from an MRI.  Patient expressed understanding and was in agreement with this plan. He also understands that He can call clinic at any time with any questions, concerns, or complaints.   Cancer Staging Primary cancer of right upper lobe of lung [facility]) Staging form: Lung, AJCC 7th Edition - Clinical stage from 05/09/2016: Stage IV (T3, N2, M1b) - Signed by Lloyd Huger, MD on 05/09/2016    Lloyd Huger, MD 09/06/16 12:01 PM

## 2016-09-04 ENCOUNTER — Inpatient Hospital Stay: Payer: BLUE CROSS/BLUE SHIELD | Attending: Oncology

## 2016-09-04 ENCOUNTER — Inpatient Hospital Stay (HOSPITAL_BASED_OUTPATIENT_CLINIC_OR_DEPARTMENT_OTHER): Payer: BLUE CROSS/BLUE SHIELD | Admitting: Oncology

## 2016-09-04 ENCOUNTER — Inpatient Hospital Stay: Payer: BLUE CROSS/BLUE SHIELD

## 2016-09-04 VITALS — BP 102/68 | HR 83 | Resp 18

## 2016-09-04 VITALS — BP 99/73 | HR 97 | Temp 97.9°F | Resp 18 | Wt 160.6 lb

## 2016-09-04 DIAGNOSIS — Z79899 Other long term (current) drug therapy: Secondary | ICD-10-CM

## 2016-09-04 DIAGNOSIS — E119 Type 2 diabetes mellitus without complications: Secondary | ICD-10-CM | POA: Diagnosis not present

## 2016-09-04 DIAGNOSIS — G939 Disorder of brain, unspecified: Secondary | ICD-10-CM | POA: Insufficient documentation

## 2016-09-04 DIAGNOSIS — K5900 Constipation, unspecified: Secondary | ICD-10-CM

## 2016-09-04 DIAGNOSIS — I10 Essential (primary) hypertension: Secondary | ICD-10-CM | POA: Insufficient documentation

## 2016-09-04 DIAGNOSIS — J449 Chronic obstructive pulmonary disease, unspecified: Secondary | ICD-10-CM | POA: Insufficient documentation

## 2016-09-04 DIAGNOSIS — C3411 Malignant neoplasm of upper lobe, right bronchus or lung: Secondary | ICD-10-CM | POA: Insufficient documentation

## 2016-09-04 DIAGNOSIS — G4700 Insomnia, unspecified: Secondary | ICD-10-CM

## 2016-09-04 DIAGNOSIS — Z87891 Personal history of nicotine dependence: Secondary | ICD-10-CM | POA: Diagnosis not present

## 2016-09-04 DIAGNOSIS — E785 Hyperlipidemia, unspecified: Secondary | ICD-10-CM | POA: Insufficient documentation

## 2016-09-04 DIAGNOSIS — F419 Anxiety disorder, unspecified: Secondary | ICD-10-CM | POA: Insufficient documentation

## 2016-09-04 DIAGNOSIS — C7951 Secondary malignant neoplasm of bone: Secondary | ICD-10-CM

## 2016-09-04 DIAGNOSIS — N289 Disorder of kidney and ureter, unspecified: Secondary | ICD-10-CM

## 2016-09-04 DIAGNOSIS — Z5111 Encounter for antineoplastic chemotherapy: Secondary | ICD-10-CM | POA: Insufficient documentation

## 2016-09-04 LAB — COMPREHENSIVE METABOLIC PANEL
ALT: 10 U/L — AB (ref 17–63)
ANION GAP: 7 (ref 5–15)
AST: 15 U/L (ref 15–41)
Albumin: 3.8 g/dL (ref 3.5–5.0)
Alkaline Phosphatase: 77 U/L (ref 38–126)
BILIRUBIN TOTAL: 0.6 mg/dL (ref 0.3–1.2)
BUN: 19 mg/dL (ref 6–20)
CALCIUM: 10 mg/dL (ref 8.9–10.3)
CO2: 26 mmol/L (ref 22–32)
CREATININE: 1.5 mg/dL — AB (ref 0.61–1.24)
Chloride: 101 mmol/L (ref 101–111)
GFR calc non Af Amer: 47 mL/min — ABNORMAL LOW (ref >60)
GFR, EST AFRICAN AMERICAN: 55 mL/min — AB (ref >60)
GLUCOSE: 184 mg/dL — AB (ref 65–99)
Potassium: 4.3 mmol/L (ref 3.5–5.1)
SODIUM: 134 mmol/L — AB (ref 135–145)
TOTAL PROTEIN: 7.5 g/dL (ref 6.5–8.1)

## 2016-09-04 LAB — CBC WITH DIFFERENTIAL/PLATELET
Basophils Absolute: 0.1 10*3/uL (ref 0–0.1)
Basophils Relative: 1 %
EOS ABS: 0.1 10*3/uL (ref 0–0.7)
Eosinophils Relative: 1 %
HCT: 33.2 % — ABNORMAL LOW (ref 40.0–52.0)
HEMOGLOBIN: 11.2 g/dL — AB (ref 13.0–18.0)
LYMPHS ABS: 0.5 10*3/uL — AB (ref 1.0–3.6)
LYMPHS PCT: 4 %
MCH: 30.4 pg (ref 26.0–34.0)
MCHC: 33.7 g/dL (ref 32.0–36.0)
MCV: 90.3 fL (ref 80.0–100.0)
MONOS PCT: 14 %
Monocytes Absolute: 1.6 10*3/uL — ABNORMAL HIGH (ref 0.2–1.0)
NEUTROS PCT: 80 %
Neutro Abs: 9.8 10*3/uL — ABNORMAL HIGH (ref 1.4–6.5)
Platelets: 371 10*3/uL (ref 150–440)
RBC: 3.68 MIL/uL — AB (ref 4.40–5.90)
RDW: 17.1 % — ABNORMAL HIGH (ref 11.5–14.5)
WBC: 12.1 10*3/uL — AB (ref 3.8–10.6)

## 2016-09-04 MED ORDER — FAMOTIDINE IN NACL 20-0.9 MG/50ML-% IV SOLN
20.0000 mg | Freq: Once | INTRAVENOUS | Status: AC
  Administered 2016-09-04: 20 mg via INTRAVENOUS
  Filled 2016-09-04: qty 50

## 2016-09-04 MED ORDER — PEGFILGRASTIM 6 MG/0.6ML ~~LOC~~ PSKT: 6.0000 mg | PREFILLED_SYRINGE | Freq: Once | SUBCUTANEOUS | Status: DC

## 2016-09-04 MED ORDER — DIPHENHYDRAMINE HCL 50 MG/ML IJ SOLN
25.0000 mg | Freq: Once | INTRAMUSCULAR | Status: AC
  Administered 2016-09-04: 25 mg via INTRAVENOUS
  Filled 2016-09-04: qty 1

## 2016-09-04 MED ORDER — DEXAMETHASONE SODIUM PHOSPHATE 10 MG/ML IJ SOLN
10.0000 mg | Freq: Once | INTRAMUSCULAR | Status: AC
  Administered 2016-09-04: 10 mg via INTRAVENOUS
  Filled 2016-09-04: qty 1

## 2016-09-04 MED ORDER — HEPARIN SOD (PORK) LOCK FLUSH 100 UNIT/ML IV SOLN
500.0000 [IU] | Freq: Once | INTRAVENOUS | Status: DC | PRN
Start: 2016-09-04 — End: 2016-09-04
  Filled 2016-09-04: qty 5

## 2016-09-04 MED ORDER — SODIUM CHLORIDE 0.9 % IV SOLN
380.0000 mg | Freq: Once | INTRAVENOUS | Status: AC
  Administered 2016-09-04: 380 mg via INTRAVENOUS
  Filled 2016-09-04: qty 38

## 2016-09-04 MED ORDER — SODIUM CHLORIDE 0.9 % IV SOLN
Freq: Once | INTRAVENOUS | Status: AC
  Administered 2016-09-04: 11:00:00 via INTRAVENOUS
  Filled 2016-09-04: qty 1000

## 2016-09-04 MED ORDER — PALONOSETRON HCL INJECTION 0.25 MG/5ML
0.2500 mg | Freq: Once | INTRAVENOUS | Status: AC
Start: 2016-09-04 — End: 2016-09-04
  Administered 2016-09-04: 0.25 mg via INTRAVENOUS
  Filled 2016-09-04: qty 5

## 2016-09-04 MED ORDER — SODIUM CHLORIDE 0.9 % IV SOLN: 10.0000 mg | Freq: Once | INTRAVENOUS | Status: DC

## 2016-09-04 MED ORDER — SODIUM CHLORIDE 0.9 % IV SOLN
175.0000 mg/m2 | Freq: Once | INTRAVENOUS | Status: AC
  Administered 2016-09-04: 336 mg via INTRAVENOUS
  Filled 2016-09-04: qty 56

## 2016-09-08 ENCOUNTER — Encounter: Payer: Self-pay | Admitting: Unknown Physician Specialty

## 2016-09-08 ENCOUNTER — Ambulatory Visit (INDEPENDENT_AMBULATORY_CARE_PROVIDER_SITE_OTHER): Payer: BLUE CROSS/BLUE SHIELD | Admitting: Unknown Physician Specialty

## 2016-09-08 DIAGNOSIS — M25552 Pain in left hip: Secondary | ICD-10-CM | POA: Insufficient documentation

## 2016-09-08 DIAGNOSIS — F419 Anxiety disorder, unspecified: Secondary | ICD-10-CM

## 2016-09-08 MED ORDER — METHYLPREDNISOLONE 4 MG PO TBPK: ORAL_TABLET | ORAL | 0 refills | Status: DC

## 2016-09-08 MED ORDER — LORAZEPAM 0.5 MG PO TABS: 0.5000 mg | ORAL_TABLET | Freq: Two times a day (BID) | ORAL | 0 refills | Status: DC

## 2016-09-08 NOTE — Assessment & Plan Note (Addendum)
Refill of Ativan 0.5 mg for 60 tablets. Last refill October.  Pt not drivieng

## 2016-09-08 NOTE — Assessment & Plan Note (Addendum)
?   Related to bone metastasis.  Doubt sciatica.  Patient advised to increase frequency of current dose of Percocet 5 mg to every 4-6 hours and was prescribed Medrol Dose Pack because patient states this relieved his joint pain in the past. Patient was counseled on constipation as a potential adverse effect of Percocet and was told to monitor his glucose closely while taking the Medrol Dose Pack. Refill of Percocet is unwarranted at this time due to patient's last refill of 60 pills on 08/25/2016.

## 2016-09-08 NOTE — Progress Notes (Signed)
BP 135/89 (BP Location: Left Arm, Patient Position: Sitting, Cuff Size: Normal)   Pulse 98   Temp 97.7 F (36.5 C)   Wt 161 lb 9.6 oz (73.3 kg)   SpO2 95%   BMI 23.19 kg/m    Subjective:    Patient ID: Ralph Lopez, male    DOB: [birth year], 66 y.o.   MRN: [id number]  HPI: [name] is a 66 y.o. male  Chief Complaint  Patient presents with  . Pain    pt states he has been having pain in left hip, bilateral knees and bilateral shoulders for about 3 weeks.   . Medication Refill    pt states he needs a refill on lorazepam    Patient with a history of Stage IV squamous cell carcinoma of right upper lobe lung with metastasis to left pubic bone presents to clinic today complaining of left hip pain that radiates to left groin that is worse at night, bilateral knee pain, and shoulder pain for 3 weeks. Patient states the pain does not radiate down his left leg. Patient was seen by his oncologist, Dr. Grayland Ormond, on 09/04/2016 and was referred to PCP for suspected sciatica. He is taking Oxycodone 5 mg BID for pain without relief. He denies fever/chills, bladder incontinence, saddle anesthesia, recent fall/trauma, dizziness, syncope, or recent infection. Patient was unaware of the metastasis to his left hip. Patient is able to ambulate independently and has used a cane for the last 3 days for assistance and denies feeling off balance.   Relevant past medical, surgical, family and social history reviewed and updated as indicated. Interim medical history since our last visit reviewed. Allergies and medications reviewed and updated.  Review of Systems  Per HPI unless specifically indicated above     Objective:    BP 135/89 (BP Location: Left Arm, Patient Position: Sitting, Cuff Size: Normal)   Pulse 98   Temp 97.7 F (36.5 C)   Wt 161 lb 9.6 oz (73.3 kg)   SpO2 95%   BMI 23.19 kg/m   Wt Readings from Last 3 Encounters:  09/08/16 161 lb 9.6 oz (73.3 kg)  09/04/16 160 lb 9.7 oz  (72.8 kg)  08/14/16 166 lb 7.2 oz (75.5 kg)    Physical Exam  Constitutional: He is oriented to person, place, and time. No distress.  HENT:  Head: Normocephalic and atraumatic.  Right Ear: External ear normal.  Left Ear: External ear normal.  Mouth/Throat: Oropharynx is clear and moist.  Eyes: Conjunctivae and EOM are normal. Right eye exhibits no discharge. Left eye exhibits no discharge. No scleral icterus.  Neck: Normal range of motion. Neck supple.  Cardiovascular: Normal rate, regular rhythm, normal heart sounds and intact distal pulses.  Exam reveals no gallop and no friction rub.   No murmur heard. Pulmonary/Chest: Effort normal and breath sounds normal. No stridor.  Abdominal: Soft.  Musculoskeletal:  No spinal tenderness, step offs. No paraspinal tenderness. Full ROM of back intact.  Mild pain with external rotation of the hip. No pain with internal rotation. No pain with knee extension and flexion. No crepitus, effusion, swelling, erythema appreciated. Negative straight leg raise.   Neurological: He is alert and oriented to person, place, and time.  Skin: Skin is warm and dry. He is not diaphoretic.  Psychiatric: He has a normal mood and affect.      Assessment & Plan:   Problem List Items Addressed This Visit      Unprioritized   Acute  anxiety    Refill of Ativan 0.5 mg for 60 tablets. Last refill October.  Pt not drivieng      Relevant Medications   LORazepam (ATIVAN) 0.5 MG tablet   Left hip pain    ? Related to bone metastasis.  Doubt sciatica.  Patient advised to increase frequency of current dose of Percocet 5 mg to every 4-6 hours and was prescribed Medrol Dose Pack because patient states this relieved his joint pain in the past. Patient was counseled on constipation as a potential adverse effect of Percocet and was told to monitor his glucose closely while taking the Medrol Dose Pack. Refill of Percocet is unwarranted at this time due to patient's last refill  of 60 pills on 08/25/2016.            Follow up plan: Return if symptoms worsen or fail to improve.

## 2016-09-09 ENCOUNTER — Ambulatory Visit
Admission: RE | Admit: 2016-09-09 | Discharge: 2016-09-09 | Disposition: A | Discharge status: Home / Self Care | Payer: BLUE CROSS/BLUE SHIELD | Source: Ambulatory Visit | Attending: Radiation Oncology | Admitting: Radiation Oncology

## 2016-09-09 ENCOUNTER — Encounter: Payer: Self-pay | Admitting: Radiation Oncology

## 2016-09-09 ENCOUNTER — Telehealth: Payer: Self-pay | Admitting: *Deleted

## 2016-09-09 VITALS — BP 145/74 | HR 93 | Temp 97.0°F | Resp 20 | Wt 161.4 lb

## 2016-09-09 DIAGNOSIS — Z9221 Personal history of antineoplastic chemotherapy: Secondary | ICD-10-CM | POA: Insufficient documentation

## 2016-09-09 DIAGNOSIS — C3411 Malignant neoplasm of upper lobe, right bronchus or lung: Secondary | ICD-10-CM

## 2016-09-09 DIAGNOSIS — M899 Disorder of bone, unspecified: Secondary | ICD-10-CM | POA: Insufficient documentation

## 2016-09-09 DIAGNOSIS — Z923 Personal history of irradiation: Secondary | ICD-10-CM | POA: Insufficient documentation

## 2016-09-09 MED ORDER — OXYCODONE-ACETAMINOPHEN 5-325 MG PO TABS: 1.0000 | ORAL_TABLET | ORAL | 0 refills | Status: DC | PRN

## 2016-09-09 NOTE — Progress Notes (Signed)
Radiation Oncology Follow up Note  Name: Ralph Lopez   Date:   09/09/2016 MRN:  [id number] DOB: Oct 16, 1950    This 66 y.o. male presents to the clinic today for one-month follow-up status post concurrent. Chemotherapy and radiation therapy in split course fashion for stage IV non-small cell lung cancer of the right upper lobe.  REFERRING PROVIDER: Kathrine Haddock, NP  HPI: Patient is a 66 year old male now one month out of split course radiation therapy with concurrent chemotherapy for non-small cell lung cancer of the right upper lobe.. He has probable stage IV disease with a lytic lesion in the left symphysis pubis. Tumor pathology was positive for squamous cell carcinoma he is seen today in routine follow-up is doing well he specifically denies cough hemoptysis or chest tightness. He's having no pain especially in the pelvic area.  COMPLICATIONS OF TREATMENT: none  FOLLOW UP COMPLIANCE: keeps appointments   PHYSICAL EXAM:  BP (!) 145/74   Pulse 93   Temp 97 F (36.1 C)   Resp 20   Wt 161 lb 6 oz (73.2 kg)   BMI 23.16 kg/m  Well-developed well-nourished patient in NAD. HEENT reveals PERLA, EOMI, discs not visualized.  Oral cavity is clear. No oral mucosal lesions are identified. Neck is clear without evidence of cervical or supraclavicular adenopathy. Lungs are clear to A&P. Cardiac examination is essentially unremarkable with regular rate and rhythm without murmur rub or thrill. Abdomen is benign with no organomegaly or masses noted. Motor sensory and DTR levels are equal and symmetric in the upper and lower extremities. Cranial nerves II through XII are grossly intact. Proprioception is intact. No peripheral adenopathy or edema is identified. No motor or sensory levels are noted. Crude visual fields are within normal range.  RADIOLOGY RESULTS: No current films for review  PLAN: At the present time patient is doing well 1 month out from concurrent chemoradiation. He is scheduled  for a follow-up PET CT scan in April. I've asked to see him back in 3-4 months for follow-up. I am please was overall progress. Should he develop pain in his pubic region I expressly told him to contact my office for evaluation. Patient knows to call otherwise with any concerns.  I would like to take this opportunity to thank you for allowing me to participate in the care of your patient.Armstead Peaks., MD

## 2016-09-09 NOTE — Telephone Encounter (Signed)
Request refill of percocet with change in directions of 1-2 tablets every 4-6 hours PRN. Okay per Dr. Grayland Ormond. prescription given to patient while in clinic.

## 2016-09-11 ENCOUNTER — Other Ambulatory Visit: Payer: Self-pay | Admitting: Unknown Physician Specialty

## 2016-09-25 ENCOUNTER — Encounter: Payer: Self-pay | Admitting: Oncology

## 2016-09-26 ENCOUNTER — Other Ambulatory Visit: Payer: Self-pay

## 2016-09-26 DIAGNOSIS — C3411 Malignant neoplasm of upper lobe, right bronchus or lung: Secondary | ICD-10-CM

## 2016-09-26 MED ORDER — OXYCODONE-ACETAMINOPHEN 5-325 MG PO TABS: 1.0000 | ORAL_TABLET | ORAL | 0 refills | Status: DC | PRN

## 2016-09-26 NOTE — Progress Notes (Signed)
Printed RX waiting for signature

## 2016-10-09 ENCOUNTER — Encounter: Payer: Self-pay | Admitting: Oncology

## 2016-10-09 DIAGNOSIS — M25552 Pain in left hip: Secondary | ICD-10-CM

## 2016-10-10 ENCOUNTER — Ambulatory Visit
Admission: RE | Admit: 2016-10-10 | Discharge: 2016-10-10 | Disposition: A | Discharge status: Home / Self Care | Payer: BLUE CROSS/BLUE SHIELD | Source: Ambulatory Visit | Attending: Oncology | Admitting: Oncology

## 2016-10-10 ENCOUNTER — Telehealth: Payer: Self-pay | Admitting: *Deleted

## 2016-10-10 ENCOUNTER — Other Ambulatory Visit: Payer: Self-pay

## 2016-10-10 DIAGNOSIS — M25552 Pain in left hip: Secondary | ICD-10-CM

## 2016-10-10 DIAGNOSIS — M899 Disorder of bone, unspecified: Secondary | ICD-10-CM | POA: Insufficient documentation

## 2016-10-10 DIAGNOSIS — C3411 Malignant neoplasm of upper lobe, right bronchus or lung: Secondary | ICD-10-CM

## 2016-10-10 MED ORDER — OXYCODONE-ACETAMINOPHEN 5-325 MG PO TABS: 1.0000 | ORAL_TABLET | ORAL | 0 refills | Status: DC | PRN

## 2016-10-10 NOTE — Telephone Encounter (Signed)
Study Result   CLINICAL DATA:  Left hip pain, history of metastatic lung cancer.  EXAM: DG HIP (WITH OR WITHOUT PELVIS) 2-3V LEFT  COMPARISON:  None.  FINDINGS: The hip joints appear normal. However, large lytic lesion is seen involving the left superior and inferior pubic rami consistent with metastatic lesion. Sacroiliac joints appear normal.  IMPRESSION: Large lytic lesion seen involving the left superior and inferior pubic rami consistent with metastatic lesion. These results will be called to the ordering clinician or representative by the Radiologist Assistant, and communication documented in the PACS or zVision Dashboard.   Electronically Signed   By: Marijo Conception, M.D.   On: 10/10/2016 13:45

## 2016-10-10 NOTE — Telephone Encounter (Signed)
Ralph Lopez is calling patient.  Plan to see Dr. Donella Stade on Monday.

## 2016-10-10 NOTE — Telephone Encounter (Signed)
Spoke with pt's wife and gave results of left hip xray that was performed today. Informed pt's wife that needs to see Dr. Baruch Gouty for radiation treatments to the left hip area. Pt will pick up refill of pain medication at [facility] location today as well. appt to see Dr. Baruch Gouty on 3/27 at 2:30pm was given to pt's wife.

## 2016-10-10 NOTE — Telephone Encounter (Signed)
Pt and pt's wife are aware of results. Scheduled to see Dr. Baruch Gouty on 3/27 at 2:30pm. Pt is aware.

## 2016-10-13 ENCOUNTER — Telehealth: Payer: Self-pay | Admitting: *Deleted

## 2016-10-13 MED ORDER — OXYCODONE HCL 10 MG PO TABS: 10.0000 mg | ORAL_TABLET | ORAL | 0 refills | Status: DC | PRN

## 2016-10-13 NOTE — Telephone Encounter (Signed)
Having terrible pain and the med given last week on relieves it for 2 hours. Needs something else. Starts XRT tomorrow. Please advise

## 2016-10-13 NOTE — Telephone Encounter (Signed)
Per VO Dr Grayland Ormond, Oxycodone 10 mg q 4 h prn.  Debbie advised of new Rx

## 2016-10-14 ENCOUNTER — Telehealth: Payer: Self-pay | Admitting: *Deleted

## 2016-10-14 ENCOUNTER — Encounter: Payer: Self-pay | Admitting: Radiation Oncology

## 2016-10-14 ENCOUNTER — Ambulatory Visit
Admission: RE | Admit: 2016-10-14 | Discharge: 2016-10-14 | Disposition: A | Discharge status: Home / Self Care | Payer: BLUE CROSS/BLUE SHIELD | Source: Ambulatory Visit | Attending: Radiation Oncology | Admitting: Radiation Oncology

## 2016-10-14 VITALS — BP 134/88 | HR 87 | Temp 96.2°F | Resp 20 | Wt 160.1 lb

## 2016-10-14 DIAGNOSIS — C3411 Malignant neoplasm of upper lobe, right bronchus or lung: Secondary | ICD-10-CM | POA: Insufficient documentation

## 2016-10-14 DIAGNOSIS — C7951 Secondary malignant neoplasm of bone: Secondary | ICD-10-CM

## 2016-10-14 DIAGNOSIS — Z87891 Personal history of nicotine dependence: Secondary | ICD-10-CM | POA: Insufficient documentation

## 2016-10-14 DIAGNOSIS — Z923 Personal history of irradiation: Secondary | ICD-10-CM | POA: Insufficient documentation

## 2016-10-14 NOTE — Telephone Encounter (Signed)
Ralph Lopez notified of new PET scan appointment on 4/4 with 11:00 arrival and Simulation appointment on 4/5 at 67.

## 2016-10-14 NOTE — Progress Notes (Signed)
Radiation Oncology Old patient new area Note  Name: Ralph Lopez   Date:   10/14/2016 MRN:  [id number] DOB: May 01, 1951    This 66 y.o. male presents to the clinic today for reevaluation for stage IV lung cancer now with large suprapubic met.  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 He has had a known lytic lesion in the symphysis pubis. 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 He did extremely well although recently presented with increasing left hip and pelvic pain plain film was performed. Again showing a large lytic lesion in the left superior and inferior pubic rami consistent with metastatic disease. He is ambulatory with difficulty at this point and is referred to radiation oncology for consideration of palliative treatment. From a chest standpoint he is doing well no dysphagia cough hemoptysis or marked shortness of breath.  COMPLICATIONS OF TREATMENT: none  FOLLOW UP COMPLIANCE: keeps appointments   PHYSICAL EXAM:  BP 134/88   Pulse 87   Temp (!) 96.2 F (35.7 C)   Resp 20   Wt 160 lb 0.9 oz (72.6 kg)   BMI 22.97 kg/m  Range of motion of his left lower Ebony Hail does elicit some pain. Motor and sensory levels are equal and symmetric in the lower extremities bilaterally. There is some pain to palpation of the symphysis pubis. Well-developed well-nourished patient in NAD. HEENT reveals PERLA, EOMI, discs not visualized.  Oral cavity is clear. No oral mucosal lesions are identified. Neck is clear without evidence of cervical or supraclavicular adenopathy. Lungs are clear to A&P. Cardiac examination is essentially unremarkable with regular rate and rhythm without murmur rub or thrill. Abdomen is benign with no organomegaly or masses noted. Motor sensory and DTR levels are equal and symmetric in the upper and lower extremities. Cranial  nerves II through XII are grossly intact. Proprioception is intact. No peripheral adenopathy or edema is identified. No motor or sensory levels are noted. Crude visual fields are within normal range.  RADIOLOGY RESULTS: Plain films are reviewed and compatible above-stated findings. I've asked for his PET/CT scan to be moved up to early next week  PLAN: At this time I think will start a course of palliative radiation therapy to symphysis pubis. I've asked for his PET/CT scan to be moved up to early next week so I can have that to review for his simulation. Risks and benefits of pelvic radiation therapy including possible increased lower urinary tract symptoms, diarrhea, fatigue, alteration of blood counts all were discussed in detail with the patient. We will arrange his PET/CT scan and set him up for simulation shortly thereafter. I would plan on treating 3000 cGy in 10 fractions.  I would like to take this opportunity to thank you for allowing me to participate in the care of your patient.Armstead Peaks., MD

## 2016-10-15 ENCOUNTER — Emergency Department (HOSPITAL_COMMUNITY): Payer: BLUE CROSS/BLUE SHIELD

## 2016-10-15 ENCOUNTER — Encounter (HOSPITAL_COMMUNITY)
Admission: EM | Disposition: A | Discharge status: Home Health | Payer: Self-pay | Source: Home / Self Care | Attending: Neurology

## 2016-10-15 ENCOUNTER — Inpatient Hospital Stay (HOSPITAL_COMMUNITY)
Admission: EM | Disposition: A | Discharge status: Home Health | Payer: Self-pay | Source: Home / Self Care | Attending: Neurology

## 2016-10-15 ENCOUNTER — Inpatient Hospital Stay (HOSPITAL_COMMUNITY): Payer: BLUE CROSS/BLUE SHIELD

## 2016-10-15 ENCOUNTER — Encounter (HOSPITAL_COMMUNITY): Payer: Self-pay | Admitting: Radiology

## 2016-10-15 ENCOUNTER — Emergency Department (HOSPITAL_COMMUNITY): Payer: BLUE CROSS/BLUE SHIELD | Admitting: Certified Registered Nurse Anesthetist

## 2016-10-15 ENCOUNTER — Inpatient Hospital Stay (HOSPITAL_COMMUNITY)
Admission: EM | Admit: 2016-10-15 | Discharge: 2016-10-27 | DRG: 023 | Disposition: A | Discharge status: Home Health | Payer: BLUE CROSS/BLUE SHIELD | Attending: Neurology | Admitting: Neurology

## 2016-10-15 DIAGNOSIS — I480 Paroxysmal atrial fibrillation: Secondary | ICD-10-CM

## 2016-10-15 DIAGNOSIS — R339 Retention of urine, unspecified: Secondary | ICD-10-CM | POA: Diagnosis not present

## 2016-10-15 DIAGNOSIS — J9690 Respiratory failure, unspecified, unspecified whether with hypoxia or hypercapnia: Secondary | ICD-10-CM

## 2016-10-15 DIAGNOSIS — N184 Chronic kidney disease, stage 4 (severe): Secondary | ICD-10-CM | POA: Diagnosis present

## 2016-10-15 DIAGNOSIS — S2241XA Multiple fractures of ribs, right side, initial encounter for closed fracture: Secondary | ICD-10-CM | POA: Diagnosis present

## 2016-10-15 DIAGNOSIS — E876 Hypokalemia: Secondary | ICD-10-CM | POA: Diagnosis not present

## 2016-10-15 DIAGNOSIS — Z833 Family history of diabetes mellitus: Secondary | ICD-10-CM

## 2016-10-15 DIAGNOSIS — I69320 Aphasia following cerebral infarction: Secondary | ICD-10-CM | POA: Diagnosis not present

## 2016-10-15 DIAGNOSIS — N179 Acute kidney failure, unspecified: Secondary | ICD-10-CM | POA: Diagnosis not present

## 2016-10-15 DIAGNOSIS — E1165 Type 2 diabetes mellitus with hyperglycemia: Secondary | ICD-10-CM | POA: Diagnosis not present

## 2016-10-15 DIAGNOSIS — K219 Gastro-esophageal reflux disease without esophagitis: Secondary | ICD-10-CM | POA: Diagnosis present

## 2016-10-15 DIAGNOSIS — G893 Neoplasm related pain (acute) (chronic): Secondary | ICD-10-CM | POA: Diagnosis not present

## 2016-10-15 DIAGNOSIS — S270XXA Traumatic pneumothorax, initial encounter: Secondary | ICD-10-CM | POA: Diagnosis present

## 2016-10-15 DIAGNOSIS — Z8249 Family history of ischemic heart disease and other diseases of the circulatory system: Secondary | ICD-10-CM

## 2016-10-15 DIAGNOSIS — I63032 Cerebral infarction due to thrombosis of left carotid artery: Secondary | ICD-10-CM | POA: Diagnosis not present

## 2016-10-15 DIAGNOSIS — D72829 Elevated white blood cell count, unspecified: Secondary | ICD-10-CM | POA: Diagnosis present

## 2016-10-15 DIAGNOSIS — I6789 Other cerebrovascular disease: Secondary | ICD-10-CM | POA: Diagnosis not present

## 2016-10-15 DIAGNOSIS — R4182 Altered mental status, unspecified: Secondary | ICD-10-CM | POA: Diagnosis present

## 2016-10-15 DIAGNOSIS — C7951 Secondary malignant neoplasm of bone: Secondary | ICD-10-CM | POA: Diagnosis present

## 2016-10-15 DIAGNOSIS — G8191 Hemiplegia, unspecified affecting right dominant side: Secondary | ICD-10-CM | POA: Diagnosis present

## 2016-10-15 DIAGNOSIS — G9340 Encephalopathy, unspecified: Secondary | ICD-10-CM | POA: Diagnosis not present

## 2016-10-15 DIAGNOSIS — I63412 Cerebral infarction due to embolism of left middle cerebral artery: Secondary | ICD-10-CM | POA: Diagnosis present

## 2016-10-15 DIAGNOSIS — J441 Chronic obstructive pulmonary disease with (acute) exacerbation: Secondary | ICD-10-CM | POA: Diagnosis not present

## 2016-10-15 DIAGNOSIS — I6310 Cerebral infarction due to embolism of unspecified precerebral artery: Secondary | ICD-10-CM | POA: Diagnosis not present

## 2016-10-15 DIAGNOSIS — I7401 Saddle embolus of abdominal aorta: Secondary | ICD-10-CM | POA: Diagnosis not present

## 2016-10-15 DIAGNOSIS — I6523 Occlusion and stenosis of bilateral carotid arteries: Secondary | ICD-10-CM | POA: Diagnosis present

## 2016-10-15 DIAGNOSIS — C801 Malignant (primary) neoplasm, unspecified: Secondary | ICD-10-CM | POA: Diagnosis not present

## 2016-10-15 DIAGNOSIS — E1122 Type 2 diabetes mellitus with diabetic chronic kidney disease: Secondary | ICD-10-CM | POA: Diagnosis present

## 2016-10-15 DIAGNOSIS — F419 Anxiety disorder, unspecified: Secondary | ICD-10-CM | POA: Diagnosis present

## 2016-10-15 DIAGNOSIS — W1839XA Other fall on same level, initial encounter: Secondary | ICD-10-CM | POA: Diagnosis present

## 2016-10-15 DIAGNOSIS — C3490 Malignant neoplasm of unspecified part of unspecified bronchus or lung: Secondary | ICD-10-CM

## 2016-10-15 DIAGNOSIS — I129 Hypertensive chronic kidney disease with stage 1 through stage 4 chronic kidney disease, or unspecified chronic kidney disease: Secondary | ICD-10-CM | POA: Diagnosis present

## 2016-10-15 DIAGNOSIS — I6521 Occlusion and stenosis of right carotid artery: Secondary | ICD-10-CM | POA: Diagnosis not present

## 2016-10-15 DIAGNOSIS — J9601 Acute respiratory failure with hypoxia: Secondary | ICD-10-CM

## 2016-10-15 DIAGNOSIS — Z794 Long term (current) use of insulin: Secondary | ICD-10-CM

## 2016-10-15 DIAGNOSIS — I6300 Cerebral infarction due to thrombosis of unspecified precerebral artery: Secondary | ICD-10-CM

## 2016-10-15 DIAGNOSIS — T380X5A Adverse effect of glucocorticoids and synthetic analogues, initial encounter: Secondary | ICD-10-CM | POA: Diagnosis not present

## 2016-10-15 DIAGNOSIS — I469 Cardiac arrest, cause unspecified: Secondary | ICD-10-CM | POA: Diagnosis not present

## 2016-10-15 DIAGNOSIS — F329 Major depressive disorder, single episode, unspecified: Secondary | ICD-10-CM | POA: Diagnosis present

## 2016-10-15 DIAGNOSIS — C3411 Malignant neoplasm of upper lobe, right bronchus or lung: Secondary | ICD-10-CM | POA: Diagnosis present

## 2016-10-15 DIAGNOSIS — E119 Type 2 diabetes mellitus without complications: Secondary | ICD-10-CM | POA: Diagnosis not present

## 2016-10-15 DIAGNOSIS — Z9911 Dependence on respirator [ventilator] status: Secondary | ICD-10-CM | POA: Diagnosis not present

## 2016-10-15 DIAGNOSIS — E785 Hyperlipidemia, unspecified: Secondary | ICD-10-CM | POA: Diagnosis present

## 2016-10-15 DIAGNOSIS — Z923 Personal history of irradiation: Secondary | ICD-10-CM

## 2016-10-15 DIAGNOSIS — I10 Essential (primary) hypertension: Secondary | ICD-10-CM

## 2016-10-15 DIAGNOSIS — I499 Cardiac arrhythmia, unspecified: Secondary | ICD-10-CM | POA: Diagnosis not present

## 2016-10-15 DIAGNOSIS — J9600 Acute respiratory failure, unspecified whether with hypoxia or hypercapnia: Secondary | ICD-10-CM | POA: Diagnosis not present

## 2016-10-15 DIAGNOSIS — K5900 Constipation, unspecified: Secondary | ICD-10-CM | POA: Diagnosis not present

## 2016-10-15 DIAGNOSIS — J939 Pneumothorax, unspecified: Secondary | ICD-10-CM

## 2016-10-15 DIAGNOSIS — N183 Chronic kidney disease, stage 3 (moderate): Secondary | ICD-10-CM | POA: Diagnosis present

## 2016-10-15 DIAGNOSIS — I639 Cerebral infarction, unspecified: Secondary | ICD-10-CM | POA: Diagnosis present

## 2016-10-15 DIAGNOSIS — Z79899 Other long term (current) drug therapy: Secondary | ICD-10-CM

## 2016-10-15 DIAGNOSIS — Z7982 Long term (current) use of aspirin: Secondary | ICD-10-CM

## 2016-10-15 DIAGNOSIS — I63312 Cerebral infarction due to thrombosis of left middle cerebral artery: Secondary | ICD-10-CM | POA: Diagnosis not present

## 2016-10-15 DIAGNOSIS — I748 Embolism and thrombosis of other arteries: Secondary | ICD-10-CM | POA: Diagnosis not present

## 2016-10-15 DIAGNOSIS — Z9221 Personal history of antineoplastic chemotherapy: Secondary | ICD-10-CM

## 2016-10-15 DIAGNOSIS — Z801 Family history of malignant neoplasm of trachea, bronchus and lung: Secondary | ICD-10-CM

## 2016-10-15 DIAGNOSIS — D649 Anemia, unspecified: Secondary | ICD-10-CM | POA: Diagnosis present

## 2016-10-15 DIAGNOSIS — R29810 Facial weakness: Secondary | ICD-10-CM | POA: Diagnosis present

## 2016-10-15 DIAGNOSIS — Z978 Presence of other specified devices: Secondary | ICD-10-CM

## 2016-10-15 DIAGNOSIS — Q251 Coarctation of aorta: Secondary | ICD-10-CM | POA: Diagnosis not present

## 2016-10-15 DIAGNOSIS — R4701 Aphasia: Secondary | ICD-10-CM | POA: Diagnosis present

## 2016-10-15 DIAGNOSIS — I4891 Unspecified atrial fibrillation: Secondary | ICD-10-CM | POA: Diagnosis not present

## 2016-10-15 DIAGNOSIS — R1310 Dysphagia, unspecified: Secondary | ICD-10-CM | POA: Diagnosis present

## 2016-10-15 DIAGNOSIS — Z87891 Personal history of nicotine dependence: Secondary | ICD-10-CM

## 2016-10-15 HISTORY — DX: Malignant (primary) neoplasm, unspecified: C80.1

## 2016-10-15 HISTORY — PX: RADIOLOGY WITH ANESTHESIA: SHX6223

## 2016-10-15 HISTORY — PX: IR GENERIC HISTORICAL: IMG1180011

## 2016-10-15 LAB — BLOOD GAS, ARTERIAL
Acid-Base Excess: 0.1 mmol/L (ref 0.0–2.0)
Bicarbonate: 23.9 mmol/L (ref 20.0–28.0)
FIO2: 40
MECHVT: 500 mL
O2 Saturation: 98.7 %
PCO2 ART: 37.3 mmHg (ref 32.0–48.0)
PEEP: 5 cmH2O
PO2 ART: 142 mmHg — AB (ref 83.0–108.0)
Patient temperature: 98.6
RATE: 14 resp/min
pH, Arterial: 7.424 (ref 7.350–7.450)

## 2016-10-15 LAB — URINALYSIS, ROUTINE W REFLEX MICROSCOPIC
BILIRUBIN URINE: NEGATIVE
GLUCOSE, UA: NEGATIVE mg/dL
HGB URINE DIPSTICK: NEGATIVE
Ketones, ur: NEGATIVE mg/dL
Leukocytes, UA: NEGATIVE
Nitrite: NEGATIVE
PH: 6 (ref 5.0–8.0)
Protein, ur: NEGATIVE mg/dL
SPECIFIC GRAVITY, URINE: 1.027 (ref 1.005–1.030)

## 2016-10-15 LAB — COMPREHENSIVE METABOLIC PANEL
ALBUMIN: 3 g/dL — AB (ref 3.5–5.0)
ALT: 8 U/L — ABNORMAL LOW (ref 17–63)
ANION GAP: 11 (ref 5–15)
AST: 18 U/L (ref 15–41)
Alkaline Phosphatase: 53 U/L (ref 38–126)
BUN: 15 mg/dL (ref 6–20)
CHLORIDE: 102 mmol/L (ref 101–111)
CO2: 25 mmol/L (ref 22–32)
Calcium: 10.4 mg/dL — ABNORMAL HIGH (ref 8.9–10.3)
Creatinine, Ser: 1.59 mg/dL — ABNORMAL HIGH (ref 0.61–1.24)
GFR calc non Af Amer: 44 mL/min — ABNORMAL LOW (ref >60)
GFR, EST AFRICAN AMERICAN: 51 mL/min — AB (ref >60)
Glucose, Bld: 188 mg/dL — ABNORMAL HIGH (ref 65–99)
Potassium: 3.6 mmol/L (ref 3.5–5.1)
SODIUM: 138 mmol/L (ref 135–145)
Total Bilirubin: 0.5 mg/dL (ref 0.3–1.2)
Total Protein: 7 g/dL (ref 6.5–8.1)

## 2016-10-15 LAB — CBC
HCT: 32.4 % — ABNORMAL LOW (ref 39.0–52.0)
Hemoglobin: 10.3 g/dL — ABNORMAL LOW (ref 13.0–17.0)
MCH: 28.7 pg (ref 26.0–34.0)
MCHC: 31.8 g/dL (ref 30.0–36.0)
MCV: 90.3 fL (ref 78.0–100.0)
Platelets: 253 10*3/uL (ref 150–400)
RBC: 3.59 MIL/uL — ABNORMAL LOW (ref 4.22–5.81)
RDW: 13.2 % (ref 11.5–15.5)
WBC: 11.9 10*3/uL — AB (ref 4.0–10.5)

## 2016-10-15 LAB — APTT: aPTT: 22 seconds — ABNORMAL LOW (ref 24–36)

## 2016-10-15 LAB — TRIGLYCERIDES: TRIGLYCERIDES: 165 mg/dL — AB (ref <150)

## 2016-10-15 LAB — ABO/RH: ABO/RH(D): A NEG

## 2016-10-15 LAB — PREPARE RBC (CROSSMATCH)

## 2016-10-15 LAB — DIFFERENTIAL
Basophils Absolute: 0 10*3/uL (ref 0.0–0.1)
Basophils Relative: 0 %
EOS ABS: 0.1 10*3/uL (ref 0.0–0.7)
Eosinophils Relative: 1 %
LYMPHS ABS: 0.6 10*3/uL — AB (ref 0.7–4.0)
Lymphocytes Relative: 5 %
Monocytes Absolute: 1.2 10*3/uL — ABNORMAL HIGH (ref 0.1–1.0)
Monocytes Relative: 10 %
NEUTROS ABS: 10 10*3/uL — AB (ref 1.7–7.7)
NEUTROS PCT: 84 %

## 2016-10-15 LAB — PROTIME-INR
INR: 1.09
PROTHROMBIN TIME: 14.2 s (ref 11.4–15.2)

## 2016-10-15 LAB — CBG MONITORING, ED
GLUCOSE-CAPILLARY: 164 mg/dL — AB (ref 65–99)
GLUCOSE-CAPILLARY: 117 mg/dL — AB (ref 65–99)

## 2016-10-15 LAB — MRSA PCR SCREENING: MRSA BY PCR: NEGATIVE

## 2016-10-15 LAB — GLUCOSE, CAPILLARY: Glucose-Capillary: 127 mg/dL — ABNORMAL HIGH (ref 65–99)

## 2016-10-15 LAB — I-STAT TROPONIN, ED: Troponin i, poc: 0 ng/mL (ref 0.00–0.08)

## 2016-10-15 LAB — RAPID URINE DRUG SCREEN, HOSP PERFORMED
Amphetamines: NOT DETECTED
BENZODIAZEPINES: NOT DETECTED
Barbiturates: NOT DETECTED
COCAINE: NOT DETECTED
OPIATES: NOT DETECTED
Tetrahydrocannabinol: NOT DETECTED

## 2016-10-15 LAB — I-STAT CHEM 8, ED
BUN: 17 mg/dL (ref 6–20)
CALCIUM ION: 1.32 mmol/L (ref 1.15–1.40)
CHLORIDE: 101 mmol/L (ref 101–111)
Creatinine, Ser: 1.5 mg/dL — ABNORMAL HIGH (ref 0.61–1.24)
Glucose, Bld: 190 mg/dL — ABNORMAL HIGH (ref 65–99)
HEMATOCRIT: 31 % — AB (ref 39.0–52.0)
Hemoglobin: 10.5 g/dL — ABNORMAL LOW (ref 13.0–17.0)
Potassium: 3.6 mmol/L (ref 3.5–5.1)
SODIUM: 139 mmol/L (ref 135–145)
TCO2: 27 mmol/L (ref 0–100)

## 2016-10-15 LAB — ETHANOL

## 2016-10-15 SURGERY — RADIOLOGY WITH ANESTHESIA
Anesthesia: General

## 2016-10-15 SURGERY — RADIOLOGY WITH ANESTHESIA
Anesthesia: Choice

## 2016-10-15 MED ORDER — DEXTROSE 5 % IV SOLN
INTRAVENOUS | Status: DC | PRN
  Administered 2016-10-15: 25 ug/min via INTRAVENOUS

## 2016-10-15 MED ORDER — ORAL CARE MOUTH RINSE
15.0000 mL | Freq: Four times a day (QID) | OROMUCOSAL | Status: DC
Start: 2016-10-16 — End: 2016-10-18
  Administered 2016-10-16 – 2016-10-18 (×8): 15 mL via OROMUCOSAL

## 2016-10-15 MED ORDER — PROPOFOL 500 MG/50ML IV EMUL
INTRAVENOUS | Status: DC | PRN
  Administered 2016-10-15: 50 ug/kg/min via INTRAVENOUS

## 2016-10-15 MED ORDER — SUCCINYLCHOLINE CHLORIDE 200 MG/10ML IV SOSY
PREFILLED_SYRINGE | INTRAVENOUS | Status: DC | PRN
  Administered 2016-10-15: 100 mg via INTRAVENOUS

## 2016-10-15 MED ORDER — ACETAMINOPHEN 650 MG RE SUPP: 650.0000 mg | RECTAL | Status: DC | PRN

## 2016-10-15 MED ORDER — CEFAZOLIN SODIUM-DEXTROSE 2-3 GM-% IV SOLR
INTRAVENOUS | Status: DC | PRN
  Administered 2016-10-15: 2 g via INTRAVENOUS

## 2016-10-15 MED ORDER — ACETAMINOPHEN 325 MG PO TABS
650.0000 mg | ORAL_TABLET | ORAL | Status: DC | PRN
  Administered 2016-10-27: 650 mg via ORAL
  Filled 2016-10-15 (×2): qty 2

## 2016-10-15 MED ORDER — ROCURONIUM BROMIDE 100 MG/10ML IV SOLN
INTRAVENOUS | Status: DC | PRN
  Administered 2016-10-15: 50 mg via INTRAVENOUS

## 2016-10-15 MED ORDER — SODIUM CHLORIDE 0.9 % IV SOLN
INTRAVENOUS | Status: DC
  Administered 2016-10-15: 16:00:00 via INTRAVENOUS

## 2016-10-15 MED ORDER — SODIUM CHLORIDE 0.9 % IJ SOLN
INTRAVENOUS | Status: AC | PRN
  Administered 2016-10-15 (×2): 25 ug via INTRA_ARTERIAL

## 2016-10-15 MED ORDER — PHENYLEPHRINE 40 MCG/ML (10ML) SYRINGE FOR IV PUSH (FOR BLOOD PRESSURE SUPPORT)
PREFILLED_SYRINGE | INTRAVENOUS | Status: DC | PRN
  Administered 2016-10-15: 80 ug via INTRAVENOUS
  Administered 2016-10-15 (×2): 120 ug via INTRAVENOUS
  Administered 2016-10-15: 80 ug via INTRAVENOUS

## 2016-10-15 MED ORDER — PROPOFOL 1000 MG/100ML IV EMUL
5.0000 ug/kg/min | INTRAVENOUS | Status: DC
  Administered 2016-10-15: 20 ug/kg/min via INTRAVENOUS
  Administered 2016-10-15: 25 ug/kg/min via INTRAVENOUS
  Administered 2016-10-16: 10 ug/kg/min via INTRAVENOUS
  Administered 2016-10-16: 25 ug/kg/min via INTRAVENOUS
  Administered 2016-10-16: 45 ug/kg/min via INTRAVENOUS
  Filled 2016-10-15 (×5): qty 100

## 2016-10-15 MED ORDER — PROPOFOL 10 MG/ML IV BOLUS
INTRAVENOUS | Status: DC | PRN
  Administered 2016-10-15: 50 mg via INTRAVENOUS
  Administered 2016-10-15: 90 mg via INTRAVENOUS

## 2016-10-15 MED ORDER — IOPAMIDOL (ISOVUE-300) INJECTION 61%
INTRAVENOUS | Status: AC
  Filled 2016-10-15: qty 150

## 2016-10-15 MED ORDER — UMECLIDINIUM-VILANTEROL 62.5-25 MCG/INH IN AEPB
1.0000 | INHALATION_SPRAY | Freq: Every day | RESPIRATORY_TRACT | Status: DC
  Filled 2016-10-15: qty 14

## 2016-10-15 MED ORDER — NITROGLYCERIN 1 MG/10 ML FOR IR/CATH LAB
INTRA_ARTERIAL | Status: AC
Start: 2016-10-15 — End: 2016-10-16
  Filled 2016-10-15: qty 10

## 2016-10-15 MED ORDER — ACETAMINOPHEN 160 MG/5ML PO SOLN: 650.0000 mg | ORAL | Status: DC | PRN

## 2016-10-15 MED ORDER — ACETAMINOPHEN 325 MG PO TABS: 650.0000 mg | ORAL_TABLET | ORAL | Status: DC | PRN

## 2016-10-15 MED ORDER — STROKE: EARLY STAGES OF RECOVERY BOOK
Freq: Once | Status: AC
  Administered 2016-10-15: 17:00:00
  Filled 2016-10-15: qty 1

## 2016-10-15 MED ORDER — IOPAMIDOL (ISOVUE-300) INJECTION 61%
INTRAVENOUS | Status: AC
  Administered 2016-10-15: 90 mL
  Filled 2016-10-15: qty 150

## 2016-10-15 MED ORDER — CEFAZOLIN SODIUM-DEXTROSE 2-4 GM/100ML-% IV SOLN
INTRAVENOUS | Status: AC
  Filled 2016-10-15: qty 100

## 2016-10-15 MED ORDER — CHLORHEXIDINE GLUCONATE 0.12% ORAL RINSE (MEDLINE KIT)
15.0000 mL | Freq: Two times a day (BID) | OROMUCOSAL | Status: DC
  Administered 2016-10-15 – 2016-10-22 (×14): 15 mL via OROMUCOSAL

## 2016-10-15 MED ORDER — ONDANSETRON HCL 4 MG/2ML IJ SOLN: 4.0000 mg | Freq: Four times a day (QID) | INTRAMUSCULAR | Status: DC | PRN

## 2016-10-15 MED ORDER — EPHEDRINE SULFATE 50 MG/ML IJ SOLN
INTRAMUSCULAR | Status: DC | PRN
  Administered 2016-10-15: 10 mg via INTRAVENOUS

## 2016-10-15 MED ORDER — INSULIN ASPART 100 UNIT/ML ~~LOC~~ SOLN
2.0000 [IU] | SUBCUTANEOUS | Status: DC
  Administered 2016-10-15: 2 [IU] via SUBCUTANEOUS
  Administered 2016-10-15: 4 [IU] via SUBCUTANEOUS
  Administered 2016-10-16 (×3): 2 [IU] via SUBCUTANEOUS
  Administered 2016-10-17 (×2): 4 [IU] via SUBCUTANEOUS
  Administered 2016-10-17: 2 [IU] via SUBCUTANEOUS
  Administered 2016-10-17: 4 [IU] via SUBCUTANEOUS
  Administered 2016-10-18: 6 [IU] via SUBCUTANEOUS

## 2016-10-15 MED ORDER — CLEVIDIPINE BUTYRATE 0.5 MG/ML IV EMUL: 0.0000 mg/h | INTRAVENOUS | Status: DC

## 2016-10-15 MED ORDER — DEXAMETHASONE SODIUM PHOSPHATE 10 MG/ML IJ SOLN
INTRAMUSCULAR | Status: DC | PRN
  Administered 2016-10-15: 10 mg via INTRAVENOUS

## 2016-10-15 MED ORDER — LIDOCAINE HCL 1 % IJ SOLN
INTRAMUSCULAR | Status: AC
  Filled 2016-10-15: qty 20

## 2016-10-15 MED ORDER — PANTOPRAZOLE SODIUM 40 MG IV SOLR
40.0000 mg | INTRAVENOUS | Status: DC
  Administered 2016-10-15 – 2016-10-19 (×5): 40 mg via INTRAVENOUS
  Filled 2016-10-15 (×5): qty 40

## 2016-10-15 MED ORDER — SODIUM CHLORIDE 0.9 % IV SOLN
0.0000 ug/min | INTRAVENOUS | Status: DC
  Administered 2016-10-15: 45 ug/min via INTRAVENOUS
  Administered 2016-10-15: 55 ug/min via INTRAVENOUS
  Administered 2016-10-15: 60 ug/min via INTRAVENOUS
  Administered 2016-10-16: 55 ug/min via INTRAVENOUS
  Administered 2016-10-16: 40 ug/min via INTRAVENOUS
  Administered 2016-10-16: 30 ug/min via INTRAVENOUS
  Administered 2016-10-16: 60 ug/min via INTRAVENOUS
  Filled 2016-10-15 (×6): qty 1

## 2016-10-15 MED ORDER — SODIUM CHLORIDE 0.9 % IV SOLN
INTRAVENOUS | Status: DC
  Administered 2016-10-15 – 2016-10-18 (×5): via INTRAVENOUS
  Administered 2016-10-18 – 2016-10-19 (×2): 75 mL/h via INTRAVENOUS
  Administered 2016-10-20 – 2016-10-21 (×4): via INTRAVENOUS

## 2016-10-15 MED ORDER — EPTIFIBATIDE 20 MG/10ML IV SOLN
INTRAVENOUS | Status: AC
  Filled 2016-10-15: qty 10

## 2016-10-15 MED ORDER — IOPAMIDOL (ISOVUE-370) INJECTION 76%
INTRAVENOUS | Status: AC
  Administered 2016-10-15: 90 mL
  Filled 2016-10-15: qty 100

## 2016-10-15 MED ORDER — SODIUM CHLORIDE 0.9 % IV SOLN
Freq: Once | INTRAVENOUS | Status: AC
  Administered 2016-10-15 (×2): via INTRAVENOUS

## 2016-10-15 NOTE — ED Triage Notes (Addendum)
Pt here via [facility] for code stroke.  Pt LSN at 3 am.  Wife heard him "fumbling around in kitchen" and found him on floor.   R sided facial droop, Loss of mobility to R arm and R leg, aphasia.  CBG 222.  Vs 118/68, 98%RA, 80 NSR, rr 16.  Stage 4 Lung CA.  Uses walker, but normally ao x 4.

## 2016-10-15 NOTE — Anesthesia Postprocedure Evaluation (Addendum)
Anesthesia Post Note  Patient: Ralph Lopez  Procedure(s) Performed: Procedure(s) (LRB): RADIOLOGY WITH ANESTHESIA (N/A)  Patient location during evaluation: SICU Anesthesia Type: General Level of consciousness: sedated and patient remains intubated per anesthesia plan Pain management: pain level controlled Vital Signs Assessment: post-procedure vital signs reviewed and stable Respiratory status: patient remains intubated per anesthesia plan Cardiovascular status: stable Anesthetic complications: no       Last Vitals:  Vitals:   10/15/16 1600 10/15/16 1630  BP: 111/71 116/80  Pulse: 80 77  Resp: 16 (!) 8  Temp:      Last Pain:  Vitals:   10/15/16 1216  TempSrc: Oral  PainSc: 0-No pain                 Deshana Rominger,JAMES TERRILL

## 2016-10-15 NOTE — ED Notes (Signed)
Family at bedside.  Dr Leonel Ramsay speaking with family.

## 2016-10-15 NOTE — Progress Notes (Signed)
Patient ID: Ralph Lopez, male   DOB: 24-Feb-1951, 66 y.o.   MRN: [id number]  Addendum . Innominate arteriogram shows a prominent filling defect extending across the origins of RT CCA and RT VA  . Suspect large thrombus with possible underlying stenosis.  Instructions   given to [name] ,stroke  coordinater  to avoid BP cuff readings  Or venous sticks for now.Marland Kitchen

## 2016-10-15 NOTE — Code Documentation (Signed)
66 y.o. Male w/ PMHx of HTN, HLD, DM, COPD, CKD and lung cancer presents to [facility] ED via [facility] as a code stroke. Pt was LKW by his wife at 0300 this morning when he got up to go the bathroom. This morning, he apparently walked to the kitchen but then was having trouble and fell. When his wife first saw him this morning, he was already aphasic and on the ground. When EMS first arrived they found him to be completely plegic on the right side and this improved en route. On arrival, he still did have some right-sided weakness and facial droop and was very densely aphasic. On arrival to [facility] ED, labs were drawn and pt taken to CT. Pt VAN (+). NIHSS 9. IR notified of possible intervention. tPA not given d/t being out of the window. 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 ASPECTS 7. CTA revealing thrombus in the distal left M1 segment to the bifurcation. CTP showing mismatch volume of 131 ml. Case discussed w/ IR MD. IR candidate. Neuro MD and SRN try to find family to discuss case d/t pt's hx and obtain consent. Puncture time delayed d/t finding family for consent. IR bedside handoff with IR RN Mardene Celeste.

## 2016-10-15 NOTE — ED Provider Notes (Signed)
[facility] DEPT Provider Note   CSN: [id number] Arrival date & time: 10/15/16  1135     History   Chief Complaint Chief Complaint  Patient presents with  . Code Stroke    HPI Ralph Lopez is a 66 y.o. male.  Patient was found by his wife this morning on the floor in the kitchen unable to move his right arm or right leg and could not speak he was last seen normal around 3 AM    Altered Mental Status   This is a new problem. The current episode started 6 to 12 hours ago. The problem has not changed since onset.Associated symptoms include weakness. Risk factors: Lung cancer. His past medical history does not include seizures.    Past Medical History:  Diagnosis Date  . Anxiety   . Cancer ([place])    Stage IV Lung  . Chronic kidney disease   . COPD (chronic obstructive pulmonary disease) ([place])   . Depression   . Diabetes mellitus without complication ([place])   . Hyperlipidemia   . Hypertension     Patient Active Problem List   Diagnosis Date Noted  . 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 Left hip pain 09/08/2016  . Primary cancer of right upper lobe of lung ([place]) 05/05/2016  . Trapezius muscle spasm 01/09/2016  . Type 2 diabetes mellitus with stage 3 chronic kidney disease, with long-term current use of insulin ([place]) 01/02/2016  . Essential hypertension 01/02/2016  . COPD, severe ([place]) 02/15/2015  . Chronic depression 02/15/2015  . CKD (chronic kidney disease), stage III 02/15/2015  . Acute anxiety 02/15/2015  . Hyperlipidemia 02/15/2015  . Diabetes ([place]) 02/15/2015    Past Surgical History:  Procedure Laterality Date  . EYE SURGERY    . FLEXIBLE BRONCHOSCOPY N/A 04/28/2016   Procedure: FLEXIBLE BRONCHOSCOPY;  Surgeon: [name], MD;  Location: [facility] ORS;  Service: Pulmonary;  Laterality: N/A;  . MANDIBLE SURGERY         Home Medications    Prior to Admission medications   Medication Sig Start Date End Date Taking? Authorizing Provider    albuterol (PROVENTIL HFA;VENTOLIN HFA) 108 (90 Base) MCG/ACT inhaler Inhale 2 puffs into the lungs every 6 (six) hours as needed for wheezing or shortness of breath.   Yes Historical Provider, MD  amLODipine (NORVASC) 5 MG tablet Take 1 tablet (5 mg total) by mouth daily. 06/27/16  Yes Kathrine Haddock, NP  aspirin EC 81 MG tablet Take 81 mg by mouth daily.   Yes Historical Provider, MD  atenolol (TENORMIN) 100 MG tablet TAKE ONE TABLET BY MOUTH ONCE DAILY 09/12/16  Yes Kathrine Haddock, NP  atorvastatin (LIPITOR) 10 MG tablet Take 10 mg by mouth daily.   Yes Historical Provider, MD  BYDUREON 2 MG PEN INJECT '2MG'$  SUBCUTANEOUSLY WEEKLY 07/11/16  Yes Volney American, PA-C  Insulin Glargine (TOUJEO SOLOSTAR) 300 UNIT/ML SOPN Inject 30 Units as directed daily. 06/02/16  Yes Kathrine Haddock, NP  lisinopril (PRINIVIL,ZESTRIL) 40 MG tablet Take 40 mg by mouth daily. 05/30/16  Yes Historical Provider, MD  LORazepam (ATIVAN) 0.5 MG tablet Take 1 tablet (0.5 mg total) by mouth 2 (two) times daily. 09/08/16  Yes Kathrine Haddock, NP  ondansetron (ZOFRAN) 8 MG tablet Take 1 tablet by mouth 2 (two) times daily as needed. 05/09/16  Yes Historical Provider, MD  Oxycodone HCl 10 MG TABS Take 1 tablet (10 mg total) by mouth every 4 (four) hours as needed. 10/13/16  Yes Lloyd Huger, MD  pantoprazole (PROTONIX) 40 MG tablet Take 1 tablet (40 mg total) by mouth daily. 05/05/16  Yes Kathrine Haddock, NP  ranitidine (ZANTAC) 150 MG tablet Take 150 mg by mouth daily as needed for heartburn.    Yes Historical Provider, MD  lisinopril (PRINIVIL,ZESTRIL) 40 MG tablet TAKE ONE TABLET BY MOUTH ONCE DAILY Patient not taking: Reported on 10/15/2016 09/12/16   Kathrine Haddock, NP  methylPREDNISolone (MEDROL DOSEPAK) 4 MG TBPK tablet As directed Patient not taking: Reported on 10/15/2016 09/08/16   Kathrine Haddock, NP  prochlorperazine (COMPAZINE) 10 MG tablet Take 1 tablet (10 mg total) by mouth every 6 (six) hours as needed (Nausea or  vomiting). Patient not taking: Reported on 10/15/2016 05/15/16   Lloyd Huger, MD  umeclidinium-vilanterol [facility] ELLIPTA) 62.5-25 MCG/INH AEPB Inhale 1 puff into the lungs daily. Patient not taking: Reported on 09/04/2016 04/04/16   Kathrine Haddock, NP  zolpidem (AMBIEN) 5 MG tablet Take 1 tablet (5 mg total) by mouth at bedtime as needed for sleep. Patient not taking: Reported on 10/15/2016 06/05/16   Lloyd Huger, MD    Family History Family History  Problem Relation Age of Onset  . Diabetes Mother   . Hypertension Mother   . Breast cancer Mother   . Thrombosis Mother   . Heart disease Father     MI  . Breast cancer Sister   . Colon cancer Brother   . Lung cancer Brother     Social History Social History  Substance Use Topics  . Smoking status: Former Smoker    Years: 20.00    Quit date: 04/22/2014  . Smokeless tobacco: Never Used  . Alcohol use No     Allergies   Patient has no known allergies.   Review of Systems Review of Systems  Unable to perform ROS: Mental status change  Neurological: Positive for weakness.     Physical Exam Updated Vital Signs BP 125/80   Pulse 77   Temp 98.3 F (36.8 C) (Oral)   Resp 11   SpO2 96%   Physical Exam  Constitutional: He appears well-developed.  HENT:  Head: Normocephalic.  Eyes: Conjunctivae and EOM are normal. No scleral icterus.  Neck: Neck supple. No thyromegaly present.  Cardiovascular: Normal rate and regular rhythm.  Exam reveals no gallop and no friction rub.   No murmur heard. Pulmonary/Chest: No stridor. He has no wheezes. He has no rales. He exhibits no tenderness.  Abdominal: He exhibits no distension. There is no tenderness. There is no rebound.  Musculoskeletal: He exhibits no edema.  Patient with of mild right hemiparesis  Lymphadenopathy:    He has no cervical adenopathy.  Neurological: He is alert. He exhibits normal muscle tone.  Patient is alert and aphasic does follow commands  Skin:  No rash noted. No erythema.  Psychiatric: He has a normal mood and affect. His behavior is normal.     ED Treatments / Results  Labs (all labs ordered are listed, but only abnormal results are displayed) Labs Reviewed  APTT - Abnormal; Notable for the following:       Result Value   aPTT 22 (*)    All other components within normal limits  CBC - Abnormal; Notable for the following:    WBC 11.9 (*)    RBC 3.59 (*)    Hemoglobin 10.3 (*)    HCT 32.4 (*)    All other components within normal limits  DIFFERENTIAL - Abnormal; Notable for the following:  Neutro Abs 10.0 (*)    Lymphs Abs 0.6 (*)    Monocytes Absolute 1.2 (*)    All other components within normal limits  COMPREHENSIVE METABOLIC PANEL - Abnormal; Notable for the following:    Glucose, Bld 188 (*)    Creatinine, Ser 1.59 (*)    Calcium 10.4 (*)    Albumin 3.0 (*)    ALT 8 (*)    GFR calc non Af Amer 44 (*)    GFR calc Af Amer 51 (*)    All other components within normal limits  CBG MONITORING, ED - Abnormal; Notable for the following:    Glucose-Capillary 164 (*)    All other components within normal limits  I-STAT CHEM 8, ED - Abnormal; Notable for the following:    Creatinine, Ser 1.50 (*)    Glucose, Bld 190 (*)    Hemoglobin 10.5 (*)    HCT 31.0 (*)    All other components within normal limits  CBG MONITORING, ED - Abnormal; Notable for the following:    Glucose-Capillary 117 (*)    All other components within normal limits  MRSA PCR SCREENING  ETHANOL  PROTIME-INR  RAPID URINE DRUG SCREEN, HOSP PERFORMED  URINALYSIS, ROUTINE W REFLEX MICROSCOPIC  I-STAT TROPOININ, ED  PREPARE RBC (CROSSMATCH)  TYPE AND SCREEN  ABO/RH    EKG  EKG Interpretation None       Radiology Ct Angio Head W Or Wo Contrast  Result Date: 10/15/2016 CLINICAL DATA:  Acute onset right-sided weakness and abnormal speech. EXAM: CT ANGIOGRAPHY HEAD AND NECK TECHNIQUE: Multidetector CT imaging of the head and neck was  performed using the standard protocol during bolus administration of intravenous contrast. Multiplanar CT image reconstructions and MIPs were obtained to evaluate the vascular anatomy. Carotid stenosis measurements (when applicable) are obtained utilizing NASCET criteria, using the distal internal carotid diameter as the denominator. CONTRAST:  90 mL Isovue 370 COMPARISON:  MRI brain 05/14/2016 FINDINGS: CTA NECK FINDINGS Aortic arch: Atherosclerotic calcifications are present at the aortic arch without compromise of the great vessel origins. There is no aneurysm. Right carotid system: The a saddle embolus is present at bifurcation of the innominate artery and to the right common carotid artery and subclavian artery. This is nonocclusive. There is some nonocclusive thrombus extending more superiorly within the right common carotid artery is well. Atherosclerotic changes are present at the right carotid bifurcation without significant stenosis. Left carotid system: The left common carotid artery is within normal limits. Atherosclerotic changes are present at left carotid bifurcation. There is calcified plaque proximally. Noncalcified posterior plaque more distally narrows the with 3 mm. This compares with a distal measurement of 4.5 mm. Posterior calcification just below the skullbase may reflect a remote injury. There is no associated stenosis. Vertebral arteries: The vertebral arteries both originate from the subclavian arteries. There is no focal stenosis of the origin. The left vertebral artery the dominant vessel. There is no significant stenosis cannot air vertebral artery in the. Dense atherosclerotic calcifications narrowing of the left vertebral artery limiting to 1.5 mm. Skeleton: Vertebral body heights and alignment are normal. There is no focal lytic or blastic lesion. The patient is edentulous. Other neck: No significant cervical adenopathy is present. No focal mucosal or submucosal lesions are present.  The submandibular and parotid glands are within normal limits bilaterally. Upper chest: A medial right upper lobe chest mass decreased in size from the previous PET scan. The lesion measures 6.0 x 7.3 cm. There is postobstructive  pneumonitis in the right upper lobe. A right pleural effusion is present. Ill-defined opacity in the superior segment of the left lower lobe is incompletely imaged. Review of the MIP images confirms the above findings CTA HEAD FINDINGS Anterior circulation: Atherosclerotic calcifications are present in precavernous cavernous internal carotid arteries bilaterally without a significant stenosis relative to the more distal vessel. The terminus is intact bilaterally. The A1 segments are normal. The right M1 segment is normal. A nonocclusive thrombus is present in the distal left M1 segment at the MCA bifurcation. MCA branch vessels opacify the. They are of decreased caliber compared to the MCA branch vessels on the right. The area some atherosclerotic irregularity a MCA branch vessels. No focal occlusion is evident. Increased collaterals are evident on the left. Posterior circulation: Moderate stenosis is present in the dominant left vertebral artery at the dural margin. PICA origins are visualized and normal. The basilar artery is normal. Both posterior cerebral arteries originate from basilar tip. There is mild irregularity without focal stenosis or occlusion. Venous sinuses: Dural sinuses are patent. The left transverse sinus dominant. Anatomic variants: None Delayed phase: 2 punctate areas of enhancement in the right frontal lobe from previous MRI are not seen on today's study. No areas of enhancement are evident on the source images. Review of the MIP images confirms the above findings IMPRESSION: 1. Thrombus in the distal left M1 segment to the bifurcation. Flow is present in reduced caliber left MCA branch vessels. The thrombus is likely nonocclusive. Collateral vessels are also present.  2. Saddle embolus at the innominate artery bifurcation into the right common carotid artery and right subclavian artery. This embolus is nonocclusive. 3. Atherosclerotic changes at the carotid bifurcations and cavernous internal carotid arteries bilaterally without significant stenosis. 4. Atherosclerotic calcifications with moderate stenosis in the proximal left vertebral artery. These results were called by telephone at the time of interpretation on 10/15/2016 at 12:30 pm to Dr. Leonel Ramsay , who verbally acknowledged these results. Electronically Signed   By: San Morelle M.D.   On: 10/15/2016 13:05   Ct Head Wo Contrast  Result Date: 10/15/2016 CLINICAL DATA:  Stroke.  Post endovascular revascularization EXAM: CT HEAD WITHOUT CONTRAST TECHNIQUE: Contiguous axial images were obtained from the base of the skull through the vertex without intravenous contrast. COMPARISON:  CT head 10/07/2016 FINDINGS: Brain: Improvement in hypodensity in the left MCA territory compared with the earlier scan. There remains hypodensity in the left inferior temporal lobe extending into the insular cortex compatible with infarction. There is edema effacing the sylvian fissure on the left. Low-density in the frontal operculum has significantly improved as has the edema in the insular cortex. Negative for acute hemorrhage. Ventricle size normal. 2 mm midline shift to the right due to edema. Vascular: Normal arterial and venous enhancement due to prior cerebral angiogram. Skull: Negative Sinuses/Orbits: Mild mucosal edema paranasal sinuses. Other: None IMPRESSION: Improvement in left MCA hypodensity following clot retrieval. There remains low-density in the left inferior temporal lobe and insular cortex with edema which has improved since earlier today. No acute hemorrhage.  Mild midline shift to the right. Electronically Signed   By: Franchot Gallo M.D.   On: 10/15/2016 14:50   Ct Angio Neck W Or Wo Contrast  Result Date:  10/15/2016 CLINICAL DATA:  Acute onset right-sided weakness and abnormal speech. EXAM: CT ANGIOGRAPHY HEAD AND NECK TECHNIQUE: Multidetector CT imaging of the head and neck was performed using the standard protocol during bolus administration of intravenous contrast. Multiplanar CT  image reconstructions and MIPs were obtained to evaluate the vascular anatomy. Carotid stenosis measurements (when applicable) are obtained utilizing NASCET criteria, using the distal internal carotid diameter as the denominator. CONTRAST:  90 mL Isovue 370 COMPARISON:  MRI brain 05/14/2016 FINDINGS: CTA NECK FINDINGS Aortic arch: Atherosclerotic calcifications are present at the aortic arch without compromise of the great vessel origins. There is no aneurysm. Right carotid system: The a saddle embolus is present at bifurcation of the innominate artery and to the right common carotid artery and subclavian artery. This is nonocclusive. There is some nonocclusive thrombus extending more superiorly within the right common carotid artery is well. Atherosclerotic changes are present at the right carotid bifurcation without significant stenosis. Left carotid system: The left common carotid artery is within normal limits. Atherosclerotic changes are present at left carotid bifurcation. There is calcified plaque proximally. Noncalcified posterior plaque more distally narrows the with 3 mm. This compares with a distal measurement of 4.5 mm. Posterior calcification just below the skullbase may reflect a remote injury. There is no associated stenosis. Vertebral arteries: The vertebral arteries both originate from the subclavian arteries. There is no focal stenosis of the origin. The left vertebral artery the dominant vessel. There is no significant stenosis cannot air vertebral artery in the. Dense atherosclerotic calcifications narrowing of the left vertebral artery limiting to 1.5 mm. Skeleton: Vertebral body heights and alignment are normal.  There is no focal lytic or blastic lesion. The patient is edentulous. Other neck: No significant cervical adenopathy is present. No focal mucosal or submucosal lesions are present. The submandibular and parotid glands are within normal limits bilaterally. Upper chest: A medial right upper lobe chest mass decreased in size from the previous PET scan. The lesion measures 6.0 x 7.3 cm. There is postobstructive pneumonitis in the right upper lobe. A right pleural effusion is present. Ill-defined opacity in the superior segment of the left lower lobe is incompletely imaged. Review of the MIP images confirms the above findings CTA HEAD FINDINGS Anterior circulation: Atherosclerotic calcifications are present in precavernous cavernous internal carotid arteries bilaterally without a significant stenosis relative to the more distal vessel. The terminus is intact bilaterally. The A1 segments are normal. The right M1 segment is normal. A nonocclusive thrombus is present in the distal left M1 segment at the MCA bifurcation. MCA branch vessels opacify the. They are of decreased caliber compared to the MCA branch vessels on the right. The area some atherosclerotic irregularity a MCA branch vessels. No focal occlusion is evident. Increased collaterals are evident on the left. Posterior circulation: Moderate stenosis is present in the dominant left vertebral artery at the dural margin. PICA origins are visualized and normal. The basilar artery is normal. Both posterior cerebral arteries originate from basilar tip. There is mild irregularity without focal stenosis or occlusion. Venous sinuses: Dural sinuses are patent. The left transverse sinus dominant. Anatomic variants: None Delayed phase: 2 punctate areas of enhancement in the right frontal lobe from previous MRI are not seen on today's study. No areas of enhancement are evident on the source images. Review of the MIP images confirms the above findings IMPRESSION: 1. Thrombus in  the distal left M1 segment to the bifurcation. Flow is present in reduced caliber left MCA branch vessels. The thrombus is likely nonocclusive. Collateral vessels are also present. 2. Saddle embolus at the innominate artery bifurcation into the right common carotid artery and right subclavian artery. This embolus is nonocclusive. 3. Atherosclerotic changes at the carotid bifurcations and cavernous  internal carotid arteries bilaterally without significant stenosis. 4. Atherosclerotic calcifications with moderate stenosis in the proximal left vertebral artery. These results were called by telephone at the time of interpretation on 10/15/2016 at 12:30 pm to Dr. Leonel Ramsay , who verbally acknowledged these results. Electronically Signed   By: San Morelle M.D.   On: 10/15/2016 13:05   Ct Cerebral Perfusion W Contrast  Result Date: 10/15/2016 CLINICAL DATA:  Acute onset of right sided weakness and abnormal speech. Lung cancer. EXAM: CT PERFUSION BRAIN TECHNIQUE: Multiphase CT imaging of the brain was performed following IV bolus contrast injection. Subsequent parametric perfusion maps were calculated using RAPID software. CONTRAST:  90 mL Isovue 370 COMPARISON:  MRI brain 05/14/2016. FINDINGS: CT Brain Perfusion Findings: CBF (<30%) Volume: 79m Perfusion (Tmax>6.0s) volume: 1722mMismatch Volume: 13147mnfarction Location:Left MCA territory. Arterial and venous and was are excellent. Minimal patient motion is evident. IMPRESSION: 1. The acute infarct involving the left MCA territory with estimated volume of CT a less than 30% at 43 mL. 2. Mismatch volume 131 mL. These results were called by telephone at the time of interpretation on 10/15/2016 at 12:30 pm to Dr. KIRLeonel Ramsaywho verbally acknowledged these results. Electronically Signed   By: ChrSan MorelleD.   On: 10/15/2016 12:39   Ct Head Code Stroke W/o Cm  Result Date: 10/15/2016 CLINICAL DATA:  Code stroke. Acute onset of right-sided  weakness. Difficulty with speech. On lung cancer. EXAM: CT HEAD WITHOUT CONTRAST TECHNIQUE: Contiguous axial images were obtained from the base of the skull through the vertex without intravenous contrast. COMPARISON:  None. FINDINGS: Brain: Areas of hypoattenuation involve the left insular cortex. Additional hypoattenuation is present in the left frontal operculum and the super ganglionic left frontal lobe. Decreased attenuation is present within the left temporal tip. There is no hemorrhage or mass lesion. Ventricles are normal size. No other acute infarct present. Vascular: Atherosclerotic calcifications are present at the cavernous internal carotid arteries and left greater than right vertebral arteries without a hyperdense vessel. Skull: No focal lytic or blastic lesions are present. Sinuses/Orbits: Paranasal sinuses and mastoid air cells are clear. ASPECTS ([facility] Early CT Score) - Ganglionic level infarction (caudate, lentiform nuclei, internal capsule, insula, M1-M3 cortex): 5/7 - Supraganglionic infarction (M4-M6 cortex): 2/3 Total score (0-10 with 10 being normal): 7/10 IMPRESSION: 1. Acute nonhemorrhagic infarct involving the left MCA territory with involvement of the left insular cortex, left frontal operculum, left temporal tip, and left super ganglionic frontal lobe. 2. ASPECTS is 7/10 These results were called by telephone at the time of interpretation on 10/15/2016 at 12:30 pm to Dr. KIRLeonel Ramsaywho verbally acknowledged these results. Electronically Signed   By: ChrSan MorelleD.   On: 10/15/2016 12:33    Procedures Procedures (including critical care time)  Medications Ordered in ED Medications  lidocaine (XYLOCAINE) 1 % (with pres) injection (not administered)  nitroGLYCERIN 100 MCG/ML intra-arterial injection (not administered)  ceFAZolin (ANCEF) 2-4 GM/100ML-% IVPB (not administered)  umeclidinium-vilanterol (ANORO ELLIPTA) 62.5-25 MCG/INH 1 puff (not  administered)   stroke: mapping our early stages of recovery book (not administered)  0.9 %  sodium chloride infusion (not administered)  acetaminophen (TYLENOL) tablet 650 mg (not administered)    Or  acetaminophen (TYLENOL) solution 650 mg (not administered)    Or  acetaminophen (TYLENOL) suppository 650 mg (not administered)  insulin aspart (novoLOG) injection 2-6 Units (not administered)  ondansetron (ZOFRAN) injection 4 mg (not administered)  0.9 %  sodium chloride infusion (  Intravenous Duplicate 6/59/93 5701)  clevidipine (CLEVIPREX) infusion 0.5 mg/mL (0 mg/hr Intravenous Not Given 10/15/16 1545)  iopamidol (ISOVUE-370) 76 % injection (90 mLs  Contrast Given 10/15/16 1151)  iopamidol (ISOVUE-300) 61 % injection (90 mLs  Contrast Given 10/15/16 1353)  0.9 %  sodium chloride infusion ( Intravenous Anesthesia Volume Adjustment 10/15/16 1439)  nitroGLYCERIN 25 mcg in sodium chloride 0.9 % 59.71 mL (25 mcg/mL) syringe (25 mcg Intra-arterial Given 10/15/16 1347)     Initial Impression / Assessment and Plan / ED Course  I have reviewed the triage vital signs and the nursing notes.  Pertinent labs & imaging results that were available during my care of the patient were reviewed by me and considered in my medical decision making (see chart for details).     CRITICAL CARE Performed by: Ziara Thelander L Total critical care time:15 minutes Critical care time was exclusive of separately billable procedures and treating other patients. Critical care was necessary to treat or prevent imminent or life-threatening deterioration. Critical care was time spent personally by me on the following activities: development of treatment plan with patient and/or surrogate as well as nursing, discussions with consultants, evaluation of patient's response to treatment, examination of patient, obtaining history from patient or surrogate, ordering and performing treatments and interventions, ordering and review of  laboratory studies, ordering and review of radiographic studies, pulse oximetry and re-evaluation of patient's condition.   Final Clinical Impressions(s) / ED Diagnoses   Final diagnoses:  Stroke (cerebrum) ([place])  Stroke [facility])  Endotracheally intubated  Coarctation of aorta, recurrent, post-intervention    New Prescriptions Current Discharge Medication List    Patient with a stroke. He was seen by the stroke team immediately and he eventually went to interventional radiology for treatment   Milton Ferguson, MD 10/15/16 1605

## 2016-10-15 NOTE — Progress Notes (Signed)
Patient ID: Ralph Lopez, male   DOB: Jun 01, 1951, 66 y.o.   MRN: [id number] INR . 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 Past history of hypertension,diabetes and hyperlipidemia and undergoing treatment for lung carcinoma with ? Prognosis. Neuro imaging revealed CBF <30 % vol 71m,Tmax>6s vol 174 ml  Mismatch ratio 4.0,vol 1346m Findings and the unknown longevity related to lung carcinoma, discussed with patients wife..OMarland Kitchention of endovascular intervention,riskd of ICH ,worsening neurologic condition ,death  And inability to revascularize all discussed with the spouse by neurologist. Wife felt that the patient would want to have the  intervention.. Informed consent obtained.for IA therapy.  S.Harris Penton.  MD

## 2016-10-15 NOTE — Transfer of Care (Signed)
Immediate Anesthesia Transfer of Care Note  Patient: Ralph Lopez  Procedure(s) Performed: Procedure(s): RADIOLOGY WITH ANESTHESIA (N/A)  Patient Location: PACU  Anesthesia Type:General  Level of Consciousness: sedated and Patient remains intubated per anesthesia plan  Airway & Oxygen Therapy: Patient remains intubated per anesthesia plan and Patient placed on Ventilator (see vital sign flow sheet for setting)  Post-op Assessment: Report given to RN and Post -op Vital signs reviewed and stable  Post vital signs: Reviewed and stable  Last Vitals:  Vitals:   10/15/16 1226 10/15/16 1231  BP: 117/71 102/76  Pulse:  99  Resp: (!) 32 (!) 22  Temp:      Last Pain:  Vitals:   10/15/16 1216  TempSrc: Oral  PainSc: 0-No pain         Complications: No apparent anesthesia complications

## 2016-10-15 NOTE — Progress Notes (Signed)
BP range 130-150 per Dr. Erlinda Hong and to go by cuff rate. Ralph Lopez, Rande Brunt, RN

## 2016-10-15 NOTE — Progress Notes (Signed)
eLink Physician-Brief Progress Note Patient Name: Ralph Lopez DOB: 05/18/1951 MRN: [id number]   Date of Service  10/15/2016  HPI/Events of Note  66 yo presented with global aphasia and RT sided weakness, S/P Lt common carotid arteriogram,followed by complete revascularization  Pf occluded LT MCA  post op resp failure on vent  eICU Interventions  PCCM consulted-ICU team aware and notified Sedation as needed Vent support Avoid secondary brain injury     Intervention Category Evaluation Type: New Patient Evaluation  [name] 10/15/2016, 4:02 PM

## 2016-10-15 NOTE — Sedation Documentation (Signed)
Pt transferred to PACU. Report given to [place], [place]

## 2016-10-15 NOTE — ED Notes (Signed)
EKG given to Dr. Zammit 

## 2016-10-15 NOTE — Anesthesia Preprocedure Evaluation (Signed)
Anesthesia Evaluation  Patient identified by MRN, date of birth, ID band Patient unresponsive    Airway Mallampati: I   Neck ROM: Full    Dental   Pulmonary COPD, former smoker,  Lung ca c mets   breath sounds clear to auscultation       Cardiovascular hypertension,  Rhythm:Regular Rate:Normal     Neuro/Psych Acute CVA CVA    GI/Hepatic   Endo/Other  diabetes  Renal/GU      Musculoskeletal   Abdominal   Peds  Hematology   Anesthesia Other Findings   Reproductive/Obstetrics                             Anesthesia Physical Anesthesia Plan  ASA: IV and emergent  Anesthesia Plan: General   Post-op Pain Management:    Induction: Intravenous and Rapid sequence  Airway Management Planned: Oral ETT  Additional Equipment: Arterial line  Intra-op Plan:   Post-operative Plan: Post-operative intubation/ventilation  Informed Consent: I have reviewed the patients History and Physical, chart, labs and discussed the procedure including the risks, benefits and alternatives for the proposed anesthesia with the patient or authorized representative who has indicated his/her understanding and acceptance.   Dental advisory given  Plan Discussed with: CRNA  Anesthesia Plan Comments:         Anesthesia Quick Evaluation

## 2016-10-15 NOTE — ED Notes (Signed)
Pt CBG was 164, notified Brenda(RN)

## 2016-10-15 NOTE — Anesthesia Procedure Notes (Signed)
Procedure Name: Intubation Date/Time: 10/15/2016 1:05 PM Performed by: Mervyn Gay Pre-anesthesia Checklist: Patient identified, Patient being monitored, Timeout performed, Emergency Drugs available and Suction available Patient Re-evaluated:Patient Re-evaluated prior to inductionOxygen Delivery Method: Circle System Utilized Preoxygenation: Pre-oxygenation with 100% oxygen Intubation Type: Rapid sequence, Cricoid Pressure applied and IV induction Laryngoscope Size: Mac Grade View: Grade II Tube type: Subglottic suction tube Tube size: 7.5 mm Number of attempts: 1 Airway Equipment and Method: Stylet Placement Confirmation: ETT inserted through vocal cords under direct vision,  positive ETCO2 and breath sounds checked- equal and bilateral Secured at: 21 cm Tube secured with: Tape Dental Injury: Teeth and Oropharynx as per pre-operative assessment

## 2016-10-15 NOTE — H&P (Signed)
Neurology H&P  CC: Aphasia  History is obtained from: Patient  HPI: Ralph Lopez is a 66 y.o. male with a history of diabetes, hypertension, hyperlipidemia who presents with right-sided weakness and aphasia. He was last seen well around 3 AM, when he got up to go the bathroom. This morning, he apparently walked to the kitchen but then is having trouble and fell. When his wife first saw him this morning, he was already aphasic and on the ground. When EMS first arrived they found him to be completely plegic on the right side and this improved en route. On arrival, he still did have some right-sided weakness and facial droop and was very densely aphasic.  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  As for his cancer, he is undergoing active treatment with carboplatin and Taxol. He had radiation therapy and is planning to undergo repeat PET soon. He is not in hospice or had a discussed with him.  LKW: 3 AM tpa given?: no, out of window  ROS:  Unable to obtain due to altered mental status.   Past Medical History:  Diagnosis Date  . Anxiety   . Cancer ([place])    Stage IV Lung  . Chronic kidney disease   . COPD (chronic obstructive pulmonary disease) ([place])   . Depression   . Diabetes mellitus without complication ([place])   . Hyperlipidemia   . Hypertension      Family History  Problem Relation Age of Onset  . Diabetes Mother   . Hypertension Mother   . Breast cancer Mother   . Thrombosis Mother   . Heart disease Father     MI  . Breast cancer Sister   . Colon cancer Brother   . Lung cancer Brother      Social History:  reports that he quit smoking about 2 years ago. He quit after 20.00 years of use. He has never used smokeless tobacco. He reports that he does not drink alcohol or use drugs.   Exam: Current vital signs: There were no vitals taken for this  visit. Vital signs in last 24 hours: Temp:  [96.2 F (35.7 C)] 96.2 F (35.7 C) (03/27 1435) Pulse Rate:  [87] 87 (03/27 1435) Resp:  [20] 20 (03/27 1435) BP: (134)/(88) 134/88 (03/27 1435) Weight:  [72.6 kg (160 lb 0.9 oz)] 72.6 kg (160 lb 0.9 oz) (03/27 1435)  Physical Exam  Constitutional: Appears well-developed and well-nourished.  Psych: Affect appropriate to situation Eyes: No scleral injection HENT: No OP obstrucion Head: Normocephalic.  Cardiovascular: Normal rate and regular rhythm.  Respiratory: Effort normal and breath sounds normal to anterior ascultation GI: Soft.  No distension. There is no tenderness.  Skin: WDI  Neuro: Mental Status: Patient is awake, alert, Follows some simple commands but is still very aphasic Cranial Nerves: II: Does not blink to threat from the right. Pupils are equal, round, and reactive to light.   III,IV, VI: He does not cross midline to the right, looking to the left. But does come back to midline. V: Does not respond to stimulation on the right the way he doesn't the left VII: Facial movement with right facial weakness VIII: hearing is intact to voice Motor: He has a very mild right hemiparesis, intact on the left. Sensory: He does not appear to respond the pain as much on the right as  the left Cerebellar: Does not cooperate, but no clear ataxia when movements are seen.  I have reviewed labs in epic and the results pertinent to this consultation are: CMP-mildly elevated creatinine at 1.59 Mildly elevated white count  I have reviewed the images obtained: CT perfusion large mismatch left MCA territory  Impression: 66 year old male with large left MCA stroke that appears to be amenable to intra-arterial intervention by CT perfusion. Though he does have cancer, his family states that he was thought to be aggressive, and he is still receiving ongoing treatment.  Given this, after discussion with Dr. Estanislado Pandy.  Recommendations: 1.  HgbA1c, fasting lipid panel 2. MRI, MRA  of the brain without contrast 3. Frequent neuro checks 4. Echocardiogram 5. Carotid dopplers 6. Prophylactic therapy-Antiplatelet med: Aspirin - dose '325mg'$  PO or '300mg'$  PR 7. Risk factor modification 8. Telemetry monitoring 9. PT consult, OT consult, Speech consult 10. please page stroke NP  Or  PA  Or MD  from 8am -4 pm starting 3/29 as this patient will be followed by the stroke team at this point.   You can look them up on [email or web address]      This patient is critically ill and at significant risk of neurological worsening, death and care requires constant monitoring of vital signs, hemodynamics,respiratory and cardiac monitoring, neurological assessment, discussion with family, other specialists and medical decision making of high complexity. I spent 65 minutes of neurocritical care time  in the care of  this patient.  Roland Rack, MD [facility] [phone]  If 7pm- 7am, please page neurology on call as listed in [place]. 10/15/2016  12:17 PM

## 2016-10-15 NOTE — Progress Notes (Signed)
Pt transported from PACU to ICU on vent with no complications.  RT will continue to monitor.

## 2016-10-15 NOTE — Procedures (Signed)
S/P Lt common carotid arteriogram,followed by complete revascularization  Pf occluded LT MCA prox with x 1 passwith Solitaire 57m x 40 mm retrieval device achieving aTICI 3 reperfusion

## 2016-10-15 NOTE — Consult Note (Signed)
PULMONARY / CRITICAL CARE MEDICINE   Name: ESVIN HNAT MRN: [id number] DOB: [birth year]    ADMISSION DATE:  10/15/2016 CONSULTATION DATE:  10/15/2016  REFERRING MD:  Dr. Leonel Ramsay   CHIEF COMPLAINT:  Large Left MCA S/P revascularization   HISTORY OF PRESENT ILLNESS:   66 year old male with PMH of DM, HTN, HLD, CKD, COPD, Stage 4 Lung CA, and Depression. Presents to ED on 3/28 as a code stroke. Wife reports seeing him at 0300 in his normal state when he got up to go to the bathroom. Later in the morning wife found him on the ground of the kitchen aphasic. 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 Upon arrival to the ED CT revealed a large left MCA. Patient was intubated and then underwent a left common carotid arteriogram followed followed by complete revascularization. PCCM asked to consult for vent management.   Of note patient is being treated with carboplatin and Taxol. Has had radiation therapy with a planned PET soon. However, he has been complaining of hip pain in which he was told was due to a pelvic mass.   PAST MEDICAL HISTORY :  He  has a past medical history of Anxiety; Cancer ([place]); Chronic kidney disease; COPD (chronic obstructive pulmonary disease) ([place]); Depression; Diabetes mellitus without complication ([place]); Hyperlipidemia; and Hypertension.  PAST SURGICAL HISTORY: He  has a past surgical history that includes Mandible surgery; Eye surgery; and Flexible bronchoscopy (N/A, 04/28/2016).  No Known Allergies  No current facility-administered medications on file prior to encounter.    Current Outpatient Prescriptions on File Prior to Encounter  Medication Sig  . amLODipine (NORVASC) 5 MG tablet Take 1 tablet (5 mg total) by mouth daily.  Marland Kitchen aspirin EC 81 MG tablet Take 81 mg by mouth daily.  Marland Kitchen atenolol (TENORMIN) 100 MG tablet TAKE ONE TABLET BY MOUTH ONCE DAILY  . atorvastatin (LIPITOR) 10 MG tablet Take 10 mg by mouth daily.  Marland Kitchen BYDUREON 2 MG PEN  INJECT '2MG'$  SUBCUTANEOUSLY WEEKLY  . Insulin Glargine (TOUJEO SOLOSTAR) 300 UNIT/ML SOPN Inject 30 Units as directed daily.  Marland Kitchen lisinopril (PRINIVIL,ZESTRIL) 40 MG tablet Take 40 mg by mouth daily.  Marland Kitchen LORazepam (ATIVAN) 0.5 MG tablet Take 1 tablet (0.5 mg total) by mouth 2 (two) times daily.  . ondansetron (ZOFRAN) 8 MG tablet Take 1 tablet by mouth 2 (two) times daily as needed.  . Oxycodone HCl 10 MG TABS Take 1 tablet (10 mg total) by mouth every 4 (four) hours as needed.  . pantoprazole (PROTONIX) 40 MG tablet Take 1 tablet (40 mg total) by mouth daily.  . ranitidine (ZANTAC) 150 MG tablet Take 150 mg by mouth daily as needed for heartburn.   Marland Kitchen lisinopril (PRINIVIL,ZESTRIL) 40 MG tablet TAKE ONE TABLET BY MOUTH ONCE DAILY (Patient not taking: Reported on 10/15/2016)  . methylPREDNISolone (MEDROL DOSEPAK) 4 MG TBPK tablet As directed (Patient not taking: Reported on 10/15/2016)  . prochlorperazine (COMPAZINE) 10 MG tablet Take 1 tablet (10 mg total) by mouth every 6 (six) hours as needed (Nausea or vomiting). (Patient not taking: Reported on 10/15/2016)  . umeclidinium-vilanterol (ANORO ELLIPTA) 62.5-25 MCG/INH AEPB Inhale 1 puff into the lungs daily. (Patient not taking: Reported on 09/04/2016)  . zolpidem (AMBIEN) 5 MG tablet Take 1 tablet (5 mg total) by mouth at bedtime as needed for sleep. (Patient not taking: Reported on 10/15/2016)    FAMILY HISTORY:  His indicated that his mother is deceased. He indicated that his father  is deceased. He indicated that both of his sisters are alive. He indicated that his brother is alive. He indicated that his maternal grandmother is deceased. He indicated that his maternal grandfather is deceased. He indicated that his paternal grandmother is deceased. He indicated that his paternal grandfather is deceased. He indicated that his son is alive.    SOCIAL HISTORY: He  reports that he quit smoking about 2 years ago. He quit after 20.00 years of use. He has never  used smokeless tobacco. He reports that he does not drink alcohol or use drugs.  REVIEW OF SYSTEMS:   Unable to review as patient is intubated and sedated.   SUBJECTIVE:  Arrived from IR post-procedure. On propofol gtt.   VITAL SIGNS: BP 116/80   Pulse 77   Temp 98.3 F (36.8 C) (Oral)   Resp (!) 8   SpO2 100%   HEMODYNAMICS:    VENTILATOR SETTINGS: Vent Mode: PRVC FiO2 (%):  [40 %-50 %] 40 % Set Rate:  [14 bmp] 14 bmp Vt Set:  [500 mL] 500 mL PEEP:  [5 cmH20] 5 cmH20 Plateau Pressure:  [16 cmH20-18 cmH20] 16 cmH20  INTAKE / OUTPUT: No intake/output data recorded.  PHYSICAL EXAMINATION: General:  Adult male, no distress  Neuro:  Sedated, follows commands, moves all extremities  HEENT:  ETT in place  Cardiovascular:  RRR, no MRG, NI S1/S2  Lungs:  Clear breath sounds, non-labored  Abdomen:  Non-distended, active bowel sounds  Musculoskeletal:  No acute  Skin:  Warm, dry, intact   LABS:  BMET  Recent Labs Lab 10/15/16 1143 10/15/16 1147  NA 138 139  K 3.6 3.6  CL 102 101  CO2 25  --   BUN 15 17  CREATININE 1.59* 1.50*  GLUCOSE 188* 190*    Electrolytes  Recent Labs Lab 10/15/16 1143  CALCIUM 10.4*    CBC  Recent Labs Lab 10/15/16 1143 10/15/16 1147  WBC 11.9*  --   HGB 10.3* 10.5*  HCT 32.4* 31.0*  PLT 253  --     Coag's  Recent Labs Lab 10/15/16 1143  APTT 22*  INR 1.09    Sepsis Markers No results for input(s): LATICACIDVEN, PROCALCITON, O2SATVEN in the last 168 hours.  ABG No results for input(s): PHART, PCO2ART, PO2ART in the last 168 hours.  Liver Enzymes  Recent Labs Lab 10/15/16 1143  AST 18  ALT 8*  ALKPHOS 53  BILITOT 0.5  ALBUMIN 3.0*    Cardiac Enzymes No results for input(s): TROPONINI, PROBNP in the last 168 hours.  Glucose  Recent Labs Lab 10/15/16 1141 10/15/16 1438  GLUCAP 164* 117*    Imaging Ct Angio Head W Or Wo Contrast  Result Date: 10/15/2016 CLINICAL DATA:  Acute onset  right-sided weakness and abnormal speech. EXAM: CT ANGIOGRAPHY HEAD AND NECK TECHNIQUE: Multidetector CT imaging of the head and neck was performed using the standard protocol during bolus administration of intravenous contrast. Multiplanar CT image reconstructions and MIPs were obtained to evaluate the vascular anatomy. Carotid stenosis measurements (when applicable) are obtained utilizing NASCET criteria, using the distal internal carotid diameter as the denominator. CONTRAST:  90 mL Isovue 370 COMPARISON:  MRI brain 05/14/2016 FINDINGS: CTA NECK FINDINGS Aortic arch: Atherosclerotic calcifications are present at the aortic arch without compromise of the great vessel origins. There is no aneurysm. Right carotid system: The a saddle embolus is present at bifurcation of the innominate artery and to the right common carotid artery and subclavian artery. This is  nonocclusive. There is some nonocclusive thrombus extending more superiorly within the right common carotid artery is well. Atherosclerotic changes are present at the right carotid bifurcation without significant stenosis. Left carotid system: The left common carotid artery is within normal limits. Atherosclerotic changes are present at left carotid bifurcation. There is calcified plaque proximally. Noncalcified posterior plaque more distally narrows the with 3 mm. This compares with a distal measurement of 4.5 mm. Posterior calcification just below the skullbase may reflect a remote injury. There is no associated stenosis. Vertebral arteries: The vertebral arteries both originate from the subclavian arteries. There is no focal stenosis of the origin. The left vertebral artery the dominant vessel. There is no significant stenosis cannot air vertebral artery in the. Dense atherosclerotic calcifications narrowing of the left vertebral artery limiting to 1.5 mm. Skeleton: Vertebral body heights and alignment are normal. There is no focal lytic or blastic lesion.  The patient is edentulous. Other neck: No significant cervical adenopathy is present. No focal mucosal or submucosal lesions are present. The submandibular and parotid glands are within normal limits bilaterally. Upper chest: A medial right upper lobe chest mass decreased in size from the previous PET scan. The lesion measures 6.0 x 7.3 cm. There is postobstructive pneumonitis in the right upper lobe. A right pleural effusion is present. Ill-defined opacity in the superior segment of the left lower lobe is incompletely imaged. Review of the MIP images confirms the above findings CTA HEAD FINDINGS Anterior circulation: Atherosclerotic calcifications are present in precavernous cavernous internal carotid arteries bilaterally without a significant stenosis relative to the more distal vessel. The terminus is intact bilaterally. The A1 segments are normal. The right M1 segment is normal. A nonocclusive thrombus is present in the distal left M1 segment at the MCA bifurcation. MCA branch vessels opacify the. They are of decreased caliber compared to the MCA branch vessels on the right. The area some atherosclerotic irregularity a MCA branch vessels. No focal occlusion is evident. Increased collaterals are evident on the left. Posterior circulation: Moderate stenosis is present in the dominant left vertebral artery at the dural margin. PICA origins are visualized and normal. The basilar artery is normal. Both posterior cerebral arteries originate from basilar tip. There is mild irregularity without focal stenosis or occlusion. Venous sinuses: Dural sinuses are patent. The left transverse sinus dominant. Anatomic variants: None Delayed phase: 2 punctate areas of enhancement in the right frontal lobe from previous MRI are not seen on today's study. No areas of enhancement are evident on the source images. Review of the MIP images confirms the above findings IMPRESSION: 1. Thrombus in the distal left M1 segment to the  bifurcation. Flow is present in reduced caliber left MCA branch vessels. The thrombus is likely nonocclusive. Collateral vessels are also present. 2. Saddle embolus at the innominate artery bifurcation into the right common carotid artery and right subclavian artery. This embolus is nonocclusive. 3. Atherosclerotic changes at the carotid bifurcations and cavernous internal carotid arteries bilaterally without significant stenosis. 4. Atherosclerotic calcifications with moderate stenosis in the proximal left vertebral artery. These results were called by telephone at the time of interpretation on 10/15/2016 at 12:30 pm to Dr. Leonel Ramsay , who verbally acknowledged these results. Electronically Signed   By: San Morelle M.D.   On: 10/15/2016 13:05   Ct Head Wo Contrast  Result Date: 10/15/2016 CLINICAL DATA:  Stroke.  Post endovascular revascularization EXAM: CT HEAD WITHOUT CONTRAST TECHNIQUE: Contiguous axial images were obtained from the base of the skull  through the vertex without intravenous contrast. COMPARISON:  CT head 10/07/2016 FINDINGS: Brain: Improvement in hypodensity in the left MCA territory compared with the earlier scan. There remains hypodensity in the left inferior temporal lobe extending into the insular cortex compatible with infarction. There is edema effacing the sylvian fissure on the left. Low-density in the frontal operculum has significantly improved as has the edema in the insular cortex. Negative for acute hemorrhage. Ventricle size normal. 2 mm midline shift to the right due to edema. Vascular: Normal arterial and venous enhancement due to prior cerebral angiogram. Skull: Negative Sinuses/Orbits: Mild mucosal edema paranasal sinuses. Other: None IMPRESSION: Improvement in left MCA hypodensity following clot retrieval. There remains low-density in the left inferior temporal lobe and insular cortex with edema which has improved since earlier today. No acute hemorrhage.  Mild  midline shift to the right. Electronically Signed   By: Franchot Gallo M.D.   On: 10/15/2016 14:50   Ct Angio Neck W Or Wo Contrast  Result Date: 10/15/2016 CLINICAL DATA:  Acute onset right-sided weakness and abnormal speech. EXAM: CT ANGIOGRAPHY HEAD AND NECK TECHNIQUE: Multidetector CT imaging of the head and neck was performed using the standard protocol during bolus administration of intravenous contrast. Multiplanar CT image reconstructions and MIPs were obtained to evaluate the vascular anatomy. Carotid stenosis measurements (when applicable) are obtained utilizing NASCET criteria, using the distal internal carotid diameter as the denominator. CONTRAST:  90 mL Isovue 370 COMPARISON:  MRI brain 05/14/2016 FINDINGS: CTA NECK FINDINGS Aortic arch: Atherosclerotic calcifications are present at the aortic arch without compromise of the great vessel origins. There is no aneurysm. Right carotid system: The a saddle embolus is present at bifurcation of the innominate artery and to the right common carotid artery and subclavian artery. This is nonocclusive. There is some nonocclusive thrombus extending more superiorly within the right common carotid artery is well. Atherosclerotic changes are present at the right carotid bifurcation without significant stenosis. Left carotid system: The left common carotid artery is within normal limits. Atherosclerotic changes are present at left carotid bifurcation. There is calcified plaque proximally. Noncalcified posterior plaque more distally narrows the with 3 mm. This compares with a distal measurement of 4.5 mm. Posterior calcification just below the skullbase may reflect a remote injury. There is no associated stenosis. Vertebral arteries: The vertebral arteries both originate from the subclavian arteries. There is no focal stenosis of the origin. The left vertebral artery the dominant vessel. There is no significant stenosis cannot air vertebral artery in the. Dense  atherosclerotic calcifications narrowing of the left vertebral artery limiting to 1.5 mm. Skeleton: Vertebral body heights and alignment are normal. There is no focal lytic or blastic lesion. The patient is edentulous. Other neck: No significant cervical adenopathy is present. No focal mucosal or submucosal lesions are present. The submandibular and parotid glands are within normal limits bilaterally. Upper chest: A medial right upper lobe chest mass decreased in size from the previous PET scan. The lesion measures 6.0 x 7.3 cm. There is postobstructive pneumonitis in the right upper lobe. A right pleural effusion is present. Ill-defined opacity in the superior segment of the left lower lobe is incompletely imaged. Review of the MIP images confirms the above findings CTA HEAD FINDINGS Anterior circulation: Atherosclerotic calcifications are present in precavernous cavernous internal carotid arteries bilaterally without a significant stenosis relative to the more distal vessel. The terminus is intact bilaterally. The A1 segments are normal. The right M1 segment is normal. A nonocclusive thrombus is  present in the distal left M1 segment at the MCA bifurcation. MCA branch vessels opacify the. They are of decreased caliber compared to the MCA branch vessels on the right. The area some atherosclerotic irregularity a MCA branch vessels. No focal occlusion is evident. Increased collaterals are evident on the left. Posterior circulation: Moderate stenosis is present in the dominant left vertebral artery at the dural margin. PICA origins are visualized and normal. The basilar artery is normal. Both posterior cerebral arteries originate from basilar tip. There is mild irregularity without focal stenosis or occlusion. Venous sinuses: Dural sinuses are patent. The left transverse sinus dominant. Anatomic variants: None Delayed phase: 2 punctate areas of enhancement in the right frontal lobe from previous MRI are not seen on  today's study. No areas of enhancement are evident on the source images. Review of the MIP images confirms the above findings IMPRESSION: 1. Thrombus in the distal left M1 segment to the bifurcation. Flow is present in reduced caliber left MCA branch vessels. The thrombus is likely nonocclusive. Collateral vessels are also present. 2. Saddle embolus at the innominate artery bifurcation into the right common carotid artery and right subclavian artery. This embolus is nonocclusive. 3. Atherosclerotic changes at the carotid bifurcations and cavernous internal carotid arteries bilaterally without significant stenosis. 4. Atherosclerotic calcifications with moderate stenosis in the proximal left vertebral artery. These results were called by telephone at the time of interpretation on 10/15/2016 at 12:30 pm to Dr. Leonel Ramsay , who verbally acknowledged these results. Electronically Signed   By: San Morelle M.D.   On: 10/15/2016 13:05   Ct Cerebral Perfusion W Contrast  Result Date: 10/15/2016 CLINICAL DATA:  Acute onset of right sided weakness and abnormal speech. Lung cancer. EXAM: CT PERFUSION BRAIN TECHNIQUE: Multiphase CT imaging of the brain was performed following IV bolus contrast injection. Subsequent parametric perfusion maps were calculated using RAPID software. CONTRAST:  90 mL Isovue 370 COMPARISON:  MRI brain 05/14/2016. FINDINGS: CT Brain Perfusion Findings: CBF (<30%) Volume: 26m Perfusion (Tmax>6.0s) volume: 1762mMismatch Volume: 13154mnfarction Location:Left MCA territory. Arterial and venous and was are excellent. Minimal patient motion is evident. IMPRESSION: 1. The acute infarct involving the left MCA territory with estimated volume of CT a less than 30% at 43 mL. 2. Mismatch volume 131 mL. These results were called by telephone at the time of interpretation on 10/15/2016 at 12:30 pm to Dr. KIRLeonel Ramsaywho verbally acknowledged these results. Electronically Signed   By: ChrSan MorelleD.   On: 10/15/2016 12:39   Dg Chest Port 1 View  Result Date: 10/15/2016 CLINICAL DATA:  Status post intubation EXAM: PORTABLE CHEST 1 VIEW COMPARISON:  04/28/2016 FINDINGS: Cardiac shadow is within normal limits. Multiple pulmonary mass lesions are noted particularly in the region of the right hilum but scattered throughout both lungs. An endotracheal tube is noted in satisfactory position. No pneumothorax is noted. Multiple right rib fractures are seen. IMPRESSION: Multiple right rib fractures. Endotracheal tube in satisfactory position. Changes consistent with lung carcinoma and multiple scattered pulmonary lesions. These have progressed in the interval from the prior exam of 05/02/2016 Electronically Signed   By: MarInez CatalinaD.   On: 10/15/2016 16:18   Ct Head Code Stroke W/o Cm  Result Date: 10/15/2016 CLINICAL DATA:  Code stroke. Acute onset of right-sided weakness. Difficulty with speech. On lung cancer. EXAM: CT HEAD WITHOUT CONTRAST TECHNIQUE: Contiguous axial images were obtained from the base of the skull through the vertex without intravenous contrast. COMPARISON:  None. FINDINGS: Brain: Areas of hypoattenuation involve the left insular cortex. Additional hypoattenuation is present in the left frontal operculum and the super ganglionic left frontal lobe. Decreased attenuation is present within the left temporal tip. There is no hemorrhage or mass lesion. Ventricles are normal size. No other acute infarct present. Vascular: Atherosclerotic calcifications are present at the cavernous internal carotid arteries and left greater than right vertebral arteries without a hyperdense vessel. Skull: No focal lytic or blastic lesions are present. Sinuses/Orbits: Paranasal sinuses and mastoid air cells are clear. ASPECTS [facility] Stroke Program Early CT Score) - Ganglionic level infarction (caudate, lentiform nuclei, internal capsule, insula, M1-M3 cortex): 5/7 - Supraganglionic infarction  (M4-M6 cortex): 2/3 Total score (0-10 with 10 being normal): 7/10 IMPRESSION: 1. Acute nonhemorrhagic infarct involving the left MCA territory with involvement of the left insular cortex, left frontal operculum, left temporal tip, and left super ganglionic frontal lobe. 2. ASPECTS is 7/10 These results were called by telephone at the time of interpretation on 10/15/2016 at 12:30 pm to Dr. Leonel Ramsay , who verbally acknowledged these results. Electronically Signed   By: San Morelle M.D.   On: 10/15/2016 12:33     STUDIES:  CTA Head/Neck 3/28 > Thrombus in the distal left M1 Segment to the bifurcation, flow is present in reduced caliber left MCA branch vessels. Thrombus is nonocclusive. Saddle embolus at the innominate artery bifurcation into the right common carotid artery and right subclavian artery.  CT Head 3/28 > Improvement of left MCA following clot retrieval. Remains low -density in the left inferior temporal lobe and insular cortex with edema which has improvement since earlier   CULTURES: None.   ANTIBIOTICS: Ancef 3/28 >>  SIGNIFICANT EVENTS: 3/28 > Presents code stroke with Left MCA > Revascularization   LINES/TUBES: ETT 3/28 >>  DISCUSSION: 66 year old male with Stage 4 lung CA presents to ED as code stroke. Imaging revealed Left MCA, patient underwent revascularization and post-op was transferred to ICU intubated.  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 H/O COPD, Stage 4 Lung CA  P:   Vent Support Wean as tolerated  Maintain Saturation >92 CXR in am  ABG now   CARDIOVASCULAR A:  H/O HTN, HLD P:  Cardiac Monitoring  Wean Cleviprex for systolic goal [id number] ECHO pending  Hold home Norvasc, ASA, Lipitor, Lisinopril   RENAL A:   CKD (Bas Crt 1.4-1.7) P:   Trend BMP Replace electrolytes as needed  Avoid Nephrotoxic Medications   GASTROINTESTINAL A:   Dysphagia  GERD  P:   Speech Evaluation post extubation   PPI  HEMATOLOGIC A:   No issues  P:  Trend CBC  SCDS   INFECTIOUS A:   No issues  P:   Trend WBC and Fever Curve   ENDOCRINE A:   DM P:   SSI Q4H glucose checks   NEUROLOGIC A:   Left MCA s/p revascularization  H/O Depression  P:   RASS goal: 0/-1 Per Neurology  Wean Propofol to achieve RASS   FAMILY  - Updates: family at updated at bedside   - Inter-disciplinary family meet or Palliative Care meeting due by:  10/22/2016  CC Time: 73  Hayden Pedro, AG-ACNP [place] [facility]  Pgr: [phone]  PCCM Pgr: [phone]  Attending Note:  66 year old male with PMH of stage 4 lung cancer who presents with an acute stroke who had an IR procedure done.  Patient was left intubated post  procedure and PCCM was consulted for vent management.  On exam, patient is sedated, following commands and moves all ext to command.  I reviewed CXR myself and ETT in good position.  Discussed with IR, target SBP of 120-150 mmHg.  Will maintain sedated and paralyzed overnight.  Once sheaths are out then will consider weaning and extubation.  Hold in ICU at this point and will continue monitoring.  The patient is critically ill with multiple organ systems failure and requires high complexity decision making for assessment and support, frequent evaluation and titration of therapies, application of advanced monitoring technologies and extensive interpretation of multiple databases.   Critical Care Time devoted to patient care services described in this note is  35  Minutes. This time reflects time of care of this signee Dr Jennet Maduro. This critical care time does not reflect procedure time, or teaching time or supervisory time of PA/NP/Med student/Med Resident etc but could involve care discussion time.  Rush Farmer, M.D. [facility] Pulmonary/Critical Care Medicine. Pager: [phone]. After hours pager: [phone].

## 2016-10-15 NOTE — Sedation Documentation (Signed)
7 Fr. Billie Ruddy to right groin

## 2016-10-16 ENCOUNTER — Encounter: Payer: Self-pay | Admitting: Oncology

## 2016-10-16 ENCOUNTER — Inpatient Hospital Stay (HOSPITAL_COMMUNITY): Payer: BLUE CROSS/BLUE SHIELD

## 2016-10-16 ENCOUNTER — Encounter (HOSPITAL_COMMUNITY)
Admission: EM | Disposition: A | Discharge status: Home Health | Payer: Self-pay | Source: Home / Self Care | Attending: Neurology

## 2016-10-16 ENCOUNTER — Encounter (HOSPITAL_COMMUNITY): Payer: Self-pay | Admitting: Interventional Radiology

## 2016-10-16 ENCOUNTER — Inpatient Hospital Stay (HOSPITAL_COMMUNITY): Payer: BLUE CROSS/BLUE SHIELD | Admitting: Anesthesiology

## 2016-10-16 DIAGNOSIS — C3411 Malignant neoplasm of upper lobe, right bronchus or lung: Secondary | ICD-10-CM

## 2016-10-16 DIAGNOSIS — I7401 Saddle embolus of abdominal aorta: Secondary | ICD-10-CM

## 2016-10-16 DIAGNOSIS — I639 Cerebral infarction, unspecified: Secondary | ICD-10-CM

## 2016-10-16 DIAGNOSIS — I63412 Cerebral infarction due to embolism of left middle cerebral artery: Secondary | ICD-10-CM

## 2016-10-16 HISTORY — PX: CAROTID-SUBCLAVIAN BYPASS GRAFT: SHX910

## 2016-10-16 LAB — BASIC METABOLIC PANEL
Anion gap: 10 (ref 5–15)
BUN: 14 mg/dL (ref 6–20)
CALCIUM: 10 mg/dL (ref 8.9–10.3)
CO2: 22 mmol/L (ref 22–32)
CREATININE: 1.08 mg/dL (ref 0.61–1.24)
Chloride: 108 mmol/L (ref 101–111)
GFR calc Af Amer: >60 mL/min (ref >60)
Glucose, Bld: 129 mg/dL — ABNORMAL HIGH (ref 65–99)
Potassium: 3.6 mmol/L (ref 3.5–5.1)
SODIUM: 140 mmol/L (ref 135–145)

## 2016-10-16 LAB — GLUCOSE, CAPILLARY
GLUCOSE-CAPILLARY: 115 mg/dL — AB (ref 65–99)
GLUCOSE-CAPILLARY: 117 mg/dL — AB (ref 65–99)
GLUCOSE-CAPILLARY: 147 mg/dL — AB (ref 65–99)
Glucose-Capillary: 128 mg/dL — ABNORMAL HIGH (ref 65–99)
Glucose-Capillary: 151 mg/dL — ABNORMAL HIGH (ref 65–99)
Glucose-Capillary: 124 mg/dL — ABNORMAL HIGH (ref 65–99)
Glucose-Capillary: 115 mg/dL — ABNORMAL HIGH (ref 65–99)

## 2016-10-16 LAB — CBC WITH DIFFERENTIAL/PLATELET
BASOS PCT: 0 %
Basophils Absolute: 0 10*3/uL (ref 0.0–0.1)
EOS ABS: 0 10*3/uL (ref 0.0–0.7)
Eosinophils Relative: 0 %
HCT: 26.3 % — ABNORMAL LOW (ref 39.0–52.0)
HEMOGLOBIN: 8.4 g/dL — AB (ref 13.0–17.0)
Lymphocytes Relative: 3 %
Lymphs Abs: 0.4 10*3/uL — ABNORMAL LOW (ref 0.7–4.0)
MCH: 28.3 pg (ref 26.0–34.0)
MCHC: 31.9 g/dL (ref 30.0–36.0)
MCV: 88.6 fL (ref 78.0–100.0)
MONO ABS: 0.4 10*3/uL (ref 0.1–1.0)
MONOS PCT: 4 %
Neutro Abs: 11.1 10*3/uL — ABNORMAL HIGH (ref 1.7–7.7)
Neutrophils Relative %: 93 %
PLATELETS: 294 10*3/uL (ref 150–400)
RBC: 2.97 MIL/uL — ABNORMAL LOW (ref 4.22–5.81)
RDW: 13.4 % (ref 11.5–15.5)
WBC: 11.9 10*3/uL — ABNORMAL HIGH (ref 4.0–10.5)

## 2016-10-16 LAB — POCT I-STAT 7, (LYTES, BLD GAS, ICA,H+H)
Acid-base deficit: 1 mmol/L (ref 0.0–2.0)
Bicarbonate: 24.2 mmol/L (ref 20.0–28.0)
Calcium, Ion: 1.37 mmol/L (ref 1.15–1.40)
HCT: 20 % — ABNORMAL LOW (ref 39.0–52.0)
HEMOGLOBIN: 6.8 g/dL — AB (ref 13.0–17.0)
O2 SAT: 100 %
PCO2 ART: 40 mmHg (ref 32.0–48.0)
PO2 ART: 502 mmHg — AB (ref 83.0–108.0)
Potassium: 3.6 mmol/L (ref 3.5–5.1)
Sodium: 144 mmol/L (ref 135–145)
TCO2: 25 mmol/L (ref 0–100)
pH, Arterial: 7.383 (ref 7.350–7.450)

## 2016-10-16 LAB — LIPID PANEL
CHOLESTEROL: 112 mg/dL (ref 0–200)
HDL: 25 mg/dL — ABNORMAL LOW (ref >40)
LDL Cholesterol: 59 mg/dL (ref 0–99)
TRIGLYCERIDES: 140 mg/dL (ref <150)
Total CHOL/HDL Ratio: 4.5 RATIO
VLDL: 28 mg/dL (ref 0–40)

## 2016-10-16 SURGERY — CREATION, BYPASS, ARTERIAL, SUBCLAVIAN TO CAROTID, USING GRAFT
Anesthesia: General | Site: Chest | Laterality: Right

## 2016-10-16 MED ORDER — MIDAZOLAM HCL 2 MG/2ML IJ SOLN
4.0000 mg | Freq: Once | INTRAMUSCULAR | Status: AC
  Administered 2016-10-16: 4 mg via INTRAVENOUS
  Filled 2016-10-16: qty 4

## 2016-10-16 MED ORDER — MIDAZOLAM HCL 2 MG/2ML IJ SOLN
INTRAMUSCULAR | Status: AC
  Filled 2016-10-16: qty 2

## 2016-10-16 MED ORDER — ROCURONIUM BROMIDE 100 MG/10ML IV SOLN
INTRAVENOUS | Status: DC | PRN
  Administered 2016-10-16: 50 mg via INTRAVENOUS
  Administered 2016-10-16: 30 mg via INTRAVENOUS

## 2016-10-16 MED ORDER — PROPOFOL 10 MG/ML IV BOLUS
INTRAVENOUS | Status: DC | PRN
  Administered 2016-10-16: 30 mg via INTRAVENOUS
  Administered 2016-10-16: 50 mg via INTRAVENOUS

## 2016-10-16 MED ORDER — HEPARIN SODIUM (PORCINE) 1000 UNIT/ML IJ SOLN
INTRAMUSCULAR | Status: AC
  Filled 2016-10-16: qty 1

## 2016-10-16 MED ORDER — HEMOSTATIC AGENTS (NO CHARGE) OPTIME
TOPICAL | Status: DC | PRN
  Administered 2016-10-16: 1 via TOPICAL

## 2016-10-16 MED ORDER — ATORVASTATIN CALCIUM 10 MG PO TABS
10.0000 mg | ORAL_TABLET | Freq: Every day | ORAL | Status: DC
  Administered 2016-10-18 – 2016-10-20 (×3): 10 mg via ORAL
  Filled 2016-10-16 (×3): qty 1

## 2016-10-16 MED ORDER — SODIUM CHLORIDE 0.9 % IV SOLN
INTRAVENOUS | Status: DC | PRN
  Administered 2016-10-16: 14:00:00

## 2016-10-16 MED ORDER — PROTAMINE SULFATE 10 MG/ML IV SOLN
INTRAVENOUS | Status: DC | PRN
  Administered 2016-10-16: 50 mg via INTRAVENOUS

## 2016-10-16 MED ORDER — PHENYLEPHRINE HCL 10 MG/ML IJ SOLN
INTRAVENOUS | Status: DC | PRN
  Administered 2016-10-16: 30 ug/min via INTRAVENOUS

## 2016-10-16 MED ORDER — DEXAMETHASONE SODIUM PHOSPHATE 10 MG/ML IJ SOLN
INTRAMUSCULAR | Status: DC | PRN
  Administered 2016-10-16: 10 mg via INTRAVENOUS

## 2016-10-16 MED ORDER — DEXAMETHASONE SODIUM PHOSPHATE 10 MG/ML IJ SOLN
INTRAMUSCULAR | Status: AC
Start: 2016-10-16 — End: 2016-10-16
  Filled 2016-10-16: qty 1

## 2016-10-16 MED ORDER — LIDOCAINE 2% (20 MG/ML) 5 ML SYRINGE
INTRAMUSCULAR | Status: AC
  Filled 2016-10-16: qty 5

## 2016-10-16 MED ORDER — PROPOFOL 500 MG/50ML IV EMUL
INTRAVENOUS | Status: DC | PRN
  Administered 2016-10-16: 50 ug/kg/min via INTRAVENOUS

## 2016-10-16 MED ORDER — ALBUMIN HUMAN 5 % IV SOLN
INTRAVENOUS | Status: DC | PRN
  Administered 2016-10-16: 17:00:00 via INTRAVENOUS

## 2016-10-16 MED ORDER — ASPIRIN EC 325 MG PO TBEC: 325.0000 mg | DELAYED_RELEASE_TABLET | Freq: Every day | ORAL | Status: DC

## 2016-10-16 MED ORDER — FENTANYL CITRATE (PF) 250 MCG/5ML IJ SOLN
INTRAMUSCULAR | Status: AC
  Filled 2016-10-16: qty 5

## 2016-10-16 MED ORDER — CEFAZOLIN SODIUM-DEXTROSE 2-3 GM-% IV SOLR
2.0000 g | Freq: Once | INTRAVENOUS | Status: AC
  Administered 2016-10-16: 2 g via INTRAVENOUS

## 2016-10-16 MED ORDER — IOPAMIDOL (ISOVUE-300) INJECTION 61%
INTRAVENOUS | Status: AC
  Filled 2016-10-16: qty 50

## 2016-10-16 MED ORDER — LIDOCAINE HCL (PF) 1 % IJ SOLN
INTRAMUSCULAR | Status: AC
  Filled 2016-10-16: qty 30

## 2016-10-16 MED ORDER — CEFAZOLIN SODIUM 1 G IJ SOLR
INTRAMUSCULAR | Status: AC
  Filled 2016-10-16: qty 20

## 2016-10-16 MED ORDER — PROTAMINE SULFATE 10 MG/ML IV SOLN
INTRAVENOUS | Status: AC
  Filled 2016-10-16: qty 25

## 2016-10-16 MED ORDER — 0.9 % SODIUM CHLORIDE (POUR BTL) OPTIME
TOPICAL | Status: DC | PRN
  Administered 2016-10-16: 2000 mL

## 2016-10-16 MED ORDER — SODIUM CHLORIDE 0.9 % IV SOLN
INTRAVENOUS | Status: DC | PRN
  Administered 2016-10-16 (×2): via INTRAVENOUS

## 2016-10-16 MED ORDER — FENTANYL CITRATE (PF) 100 MCG/2ML IJ SOLN
INTRAMUSCULAR | Status: DC | PRN
  Administered 2016-10-16 (×2): 50 ug via INTRAVENOUS
  Administered 2016-10-16: 100 ug via INTRAVENOUS
  Administered 2016-10-16: 50 ug via INTRAVENOUS

## 2016-10-16 MED ORDER — HEPARIN SODIUM (PORCINE) 1000 UNIT/ML IJ SOLN
INTRAMUSCULAR | Status: DC | PRN
  Administered 2016-10-16: 4000 [IU] via INTRAVENOUS

## 2016-10-16 MED ORDER — ASPIRIN 300 MG RE SUPP
300.0000 mg | Freq: Every day | RECTAL | Status: DC
Start: 2016-10-16 — End: 2016-10-20
  Administered 2016-10-16 – 2016-10-19 (×4): 300 mg via RECTAL
  Filled 2016-10-16 (×5): qty 1

## 2016-10-16 MED ORDER — LABETALOL HCL 5 MG/ML IV SOLN
10.0000 mg | INTRAVENOUS | Status: DC | PRN
  Administered 2016-10-22 – 2016-10-23 (×2): 20 mg via INTRAVENOUS
  Filled 2016-10-16 (×2): qty 4

## 2016-10-16 MED ORDER — SODIUM CHLORIDE 0.9 % IV SOLN
INTRAVENOUS | Status: DC | PRN
  Administered 2016-10-16: 16:00:00 via INTRAVENOUS

## 2016-10-16 MED ORDER — PROPOFOL 10 MG/ML IV BOLUS
INTRAVENOUS | Status: AC
  Filled 2016-10-16: qty 20

## 2016-10-16 MED ORDER — MIDAZOLAM HCL 5 MG/5ML IJ SOLN
INTRAMUSCULAR | Status: DC | PRN
  Administered 2016-10-16: 2 mg via INTRAVENOUS

## 2016-10-16 MED ORDER — HEPARIN (PORCINE) IN NACL 100-0.45 UNIT/ML-% IJ SOLN
950.0000 [IU]/h | INTRAMUSCULAR | Status: DC
  Administered 2016-10-16: 950 [IU]/h via INTRAVENOUS
  Filled 2016-10-16: qty 250

## 2016-10-16 MED ORDER — CHLORHEXIDINE GLUCONATE 0.12 % MT SOLN
OROMUCOSAL | Status: AC
  Administered 2016-10-16: 15 mL via OROMUCOSAL
  Filled 2016-10-16: qty 15

## 2016-10-16 SURGICAL SUPPLY — 58 items
CANISTER SUCT 3000ML PPV (MISCELLANEOUS) ×3 IMPLANT
CATH EMB 4FR 40CM (CATHETERS) ×3 IMPLANT
CATH EMB 4FR 80CM (CATHETERS) IMPLANT
CATH EMB 5FR 80CM (CATHETERS) IMPLANT
CATH ROBINSON RED A/P 18FR (CATHETERS) ×3 IMPLANT
CLIP TI MEDIUM 24 (CLIP) ×3 IMPLANT
CLIP TI WIDE RED SMALL 24 (CLIP) ×3 IMPLANT
CONT SPEC 4OZ CLIKSEAL STRL BL (MISCELLANEOUS) ×3 IMPLANT
CRADLE DONUT ADULT HEAD (MISCELLANEOUS) ×3 IMPLANT
DERMABOND ADVANCED (GAUZE/BANDAGES/DRESSINGS) ×1
DERMABOND ADVANCED .7 DNX12 (GAUZE/BANDAGES/DRESSINGS) ×2 IMPLANT
DRAPE ORTHO SPLIT 87X125 STRL (DRAPES) ×3 IMPLANT
ELECT REM PT RETURN 9FT ADLT (ELECTROSURGICAL) ×3
ELECTRODE REM PT RTRN 9FT ADLT (ELECTROSURGICAL) ×2 IMPLANT
GLOVE BIO SURGEON STRL SZ 6.5 (GLOVE) ×6 IMPLANT
GLOVE BIO SURGEON STRL SZ7.5 (GLOVE) ×6 IMPLANT
GLOVE BIOGEL PI IND STRL 6.5 (GLOVE) ×6 IMPLANT
GLOVE BIOGEL PI IND STRL 7.0 (GLOVE) ×4 IMPLANT
GLOVE BIOGEL PI IND STRL 8 (GLOVE) ×2 IMPLANT
GLOVE BIOGEL PI INDICATOR 6.5 (GLOVE) ×3
GLOVE BIOGEL PI INDICATOR 7.0 (GLOVE) ×2
GLOVE BIOGEL PI INDICATOR 8 (GLOVE) ×1
GOWN STRL REUS W/ TWL LRG LVL3 (GOWN DISPOSABLE) ×6 IMPLANT
GOWN STRL REUS W/ TWL XL LVL3 (GOWN DISPOSABLE) ×6 IMPLANT
GOWN STRL REUS W/TWL LRG LVL3 (GOWN DISPOSABLE) ×3
GOWN STRL REUS W/TWL XL LVL3 (GOWN DISPOSABLE) ×3
HEMOSTAT SNOW SURGICEL 2X4 (HEMOSTASIS) IMPLANT
INSERT FOGARTY SM (MISCELLANEOUS) ×6 IMPLANT
IV ADAPTER SYR DOUBLE MALE LL (MISCELLANEOUS) ×3 IMPLANT
KIT BASIN OR (CUSTOM PROCEDURE TRAY) ×3 IMPLANT
KIT ROOM TURNOVER OR (KITS) ×3 IMPLANT
NEEDLE HYPO 25GX1X1/2 BEV (NEEDLE) IMPLANT
NS IRRIG 1000ML POUR BTL (IV SOLUTION) ×6 IMPLANT
PACK CAROTID (CUSTOM PROCEDURE TRAY) ×3 IMPLANT
PAD ARMBOARD 7.5X6 YLW CONV (MISCELLANEOUS) ×6 IMPLANT
SET COLLECT BLD 21X3/4 12 (NEEDLE) IMPLANT
SET COLLECT BLD 21X3/4 12 PB (MISCELLANEOUS) ×3 IMPLANT
SHUNT CAROTID BYPASS 10 (VASCULAR PRODUCTS) IMPLANT
SHUNT CAROTID BYPASS 12FRX15.5 (VASCULAR PRODUCTS) IMPLANT
STOCKINETTE 6  STRL (DRAPES) ×1
STOCKINETTE 6 STRL (DRAPES) ×2 IMPLANT
STOPCOCK 4 WAY LG BORE MALE ST (IV SETS) IMPLANT
SURGICEL SNOW 4INWX4INL (HEMOSTASIS) ×3 IMPLANT
SUT MNCRL AB 4-0 PS2 18 (SUTURE) ×3 IMPLANT
SUT PROLENE 5 0 C 1 24 (SUTURE) ×6 IMPLANT
SUT PROLENE 6 0 BV (SUTURE) ×6 IMPLANT
SUT PROLENE 7 0 BV 1 (SUTURE) IMPLANT
SUT VIC AB 2-0 CT1 27 (SUTURE) ×1
SUT VIC AB 2-0 CT1 TAPERPNT 27 (SUTURE) ×2 IMPLANT
SUT VIC AB 3-0 SH 27 (SUTURE) ×1
SUT VIC AB 3-0 SH 27X BRD (SUTURE) ×2 IMPLANT
SUT VICRYL 4-0 PS2 18IN ABS (SUTURE) ×3 IMPLANT
SYR 3ML LL SCALE MARK (SYRINGE) IMPLANT
SYR CONTROL 10ML LL (SYRINGE) IMPLANT
TUBING ART PRESS 48 MALE/FEM (TUBING) ×3 IMPLANT
TUBING EXTENTION W/L.L. (IV SETS) IMPLANT
UNDERPAD 30X30 (UNDERPADS AND DIAPERS) ×3 IMPLANT
WATER STERILE IRR 1000ML POUR (IV SOLUTION) ×3 IMPLANT

## 2016-10-16 NOTE — Anesthesia Preprocedure Evaluation (Addendum)
Anesthesia Evaluation  Patient identified by MRN, date of birth, ID band Patient awake  General Assessment Comment:History noted. CG  Patient on propofol infusion, intubated on arrival to ICU.  Reviewed: Allergy & Precautions, NPO status , Patient's Chart, lab work & pertinent test results  Airway Mallampati: Intubated       Dental   Pulmonary COPD, former smoker,     + decreased breath sounds      Cardiovascular hypertension,  Rhythm:Regular Rate:Normal     Neuro/Psych CVA    GI/Hepatic negative GI ROS, Neg liver ROS,   Endo/Other  diabetes  Renal/GU Renal disease     Musculoskeletal   Abdominal   Peds  Hematology   Anesthesia Other Findings   Reproductive/Obstetrics                           Anesthesia Physical Anesthesia Plan  ASA: IV  Anesthesia Plan: General   Post-op Pain Management:    Induction: Intravenous  Airway Management Planned: Oral ETT  Additional Equipment:   Intra-op Plan:   Post-operative Plan: Post-operative intubation/ventilation  Informed Consent:   Plan Discussed with: CRNA, Anesthesiologist and Surgeon  Anesthesia Plan Comments:         Anesthesia Quick Evaluation

## 2016-10-16 NOTE — Progress Notes (Signed)
STROKE TEAM PROGRESS NOTE   SUBJECTIVE (INTERVAL HISTORY) Patient was seen and examined this morning.Patient was admitted yesterday with an acute left MCA CVA and underwent revascularization of the left common carotid artery. 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 Patient's original deficits included right hemiplegia, a seizure and right sided facial droop. This morning patient is intubated, but is able to answer yes and no questions. He is comfortable on the ventilator. He denies any current complaints.   OBJECTIVE Temp:  [97.4 F (36.3 C)-98.1 F (36.7 C)] 97.5 F (36.4 C) (03/29 1200) Pulse Rate:  [70-94] 94 (03/29 1300) Cardiac Rhythm: Normal sinus rhythm (03/29 0800) Resp:  [0-23] 23 (03/29 1300) BP: (110-157)/(60-89) 139/69 (03/29 1300) SpO2:  [95 %-100 %] 100 % (03/29 1300) Arterial Line BP: (126-163)/(54-71) 152/62 (03/29 1300) FiO2 (%):  [40 %-50 %] 40 % (03/29 1305)   Recent Labs Lab 10/15/16 2042 10/15/16 2341 10/16/16 0509 10/16/16 0818 10/16/16 1158  GLUCAP 151* 128* 124* 117* 115*    Recent Labs Lab 10/15/16 1143 10/15/16 1147 10/16/16 0557  NA 138 139 140  K 3.6 3.6 3.6  CL 102 101 108  CO2 25  --  22  GLUCOSE 188* 190* 129*  BUN '15 17 14  '$ CREATININE 1.59* 1.50* 1.08  CALCIUM 10.4*  --  10.0    Recent Labs Lab 10/15/16 1143  AST 18  ALT 8*  ALKPHOS 53  BILITOT 0.5  PROT 7.0  ALBUMIN 3.0*    Recent Labs Lab 10/15/16 1143 10/15/16 1147 10/16/16 0557  WBC 11.9*  --  11.9*  NEUTROABS 10.0*  --  11.1*  HGB 10.3* 10.5* 8.4*  HCT 32.4* 31.0* 26.3*  MCV 90.3  --  88.6  PLT 253  --  294    Recent Labs  10/15/16 1143  LABPROT 14.2  INR 1.09    Recent Labs  10/15/16 1721  COLORURINE STRAW*  LABSPEC 1.027  PHURINE 6.0  GLUCOSEU NEGATIVE  HGBUR NEGATIVE  BILIRUBINUR NEGATIVE  KETONESUR NEGATIVE  PROTEINUR NEGATIVE   NITRITE NEGATIVE  LEUKOCYTESUR NEGATIVE       Component Value Date/Time   CHOL 112 10/16/2016 0557   CHOL 153 01/02/2016 0952   CHOL 135 07/03/2015 1055   TRIG 140 10/16/2016 0557   TRIG 94 07/03/2015 1055   HDL 25 (L) 10/16/2016 0557   HDL 30 (L) 01/02/2016 0952   CHOLHDL 4.5 10/16/2016 0557   VLDL 28 10/16/2016 0557   VLDL 19 07/03/2015 1055   LDLCALC 59 10/16/2016 0557   LDLCALC 97 01/02/2016 0952   Lab Results  Component Value Date   HGBA1C 6.7 (A) 05/01/2014      Component Value Date/Time   LABOPIA NONE DETECTED 10/15/2016 1720   COCAINSCRNUR NONE DETECTED 10/15/2016 1720   LABBENZ NONE DETECTED 10/15/2016 1720   AMPHETMU NONE DETECTED 10/15/2016 1720   THCU NONE DETECTED 10/15/2016 1720   LABBARB NONE DETECTED 10/15/2016 1720     Recent Labs Lab 10/15/16 1143  [place] <5    I have personally reviewed the radiological images below and agree with the radiology interpretations.  Ct Angio Head W Or Wo Contrast  Result Date: 10/15/2016 CLINICAL DATA:  Acute onset right-sided weakness and abnormal speech. EXAM: CT ANGIOGRAPHY HEAD AND NECK TECHNIQUE: Multidetector CT imaging of the head and neck was performed using the standard protocol during bolus administration of intravenous contrast. Multiplanar CT  image reconstructions and MIPs were obtained to evaluate the vascular anatomy. Carotid stenosis measurements (when applicable) are obtained utilizing NASCET criteria, using the distal internal carotid diameter as the denominator. CONTRAST:  90 mL Isovue 370 COMPARISON:  MRI brain 05/14/2016 FINDINGS: CTA NECK FINDINGS Aortic arch: Atherosclerotic calcifications are present at the aortic arch without compromise of the great vessel origins. There is no aneurysm. Right carotid system: The a saddle embolus is present at bifurcation of the innominate artery and to the right common carotid artery and subclavian artery. This is nonocclusive. There is some nonocclusive thrombus  extending more superiorly within the right common carotid artery is well. Atherosclerotic changes are present at the right carotid bifurcation without significant stenosis. Left carotid system: The left common carotid artery is within normal limits. Atherosclerotic changes are present at left carotid bifurcation. There is calcified plaque proximally. Noncalcified posterior plaque more distally narrows the with 3 mm. This compares with a distal measurement of 4.5 mm. Posterior calcification just below the skullbase may reflect a remote injury. There is no associated stenosis. Vertebral arteries: The vertebral arteries both originate from the subclavian arteries. There is no focal stenosis of the origin. The left vertebral artery the dominant vessel. There is no significant stenosis cannot air vertebral artery in the. Dense atherosclerotic calcifications narrowing of the left vertebral artery limiting to 1.5 mm. Skeleton: Vertebral body heights and alignment are normal. There is no focal lytic or blastic lesion. The patient is edentulous. Other neck: No significant cervical adenopathy is present. No focal mucosal or submucosal lesions are present. The submandibular and parotid glands are within normal limits bilaterally. Upper chest: A medial right upper lobe chest mass decreased in size from the previous PET scan. The lesion measures 6.0 x 7.3 cm. There is postobstructive pneumonitis in the right upper lobe. A right pleural effusion is present. Ill-defined opacity in the superior segment of the left lower lobe is incompletely imaged. Review of the MIP images confirms the above findings CTA HEAD FINDINGS Anterior circulation: Atherosclerotic calcifications are present in precavernous cavernous internal carotid arteries bilaterally without a significant stenosis relative to the more distal vessel. The terminus is intact bilaterally. The A1 segments are normal. The right M1 segment is normal. A nonocclusive thrombus is  present in the distal left M1 segment at the MCA bifurcation. MCA branch vessels opacify the. They are of decreased caliber compared to the MCA branch vessels on the right. The area some atherosclerotic irregularity a MCA branch vessels. No focal occlusion is evident. Increased collaterals are evident on the left. Posterior circulation: Moderate stenosis is present in the dominant left vertebral artery at the dural margin. PICA origins are visualized and normal. The basilar artery is normal. Both posterior cerebral arteries originate from basilar tip. There is mild irregularity without focal stenosis or occlusion. Venous sinuses: Dural sinuses are patent. The left transverse sinus dominant. Anatomic variants: None Delayed phase: 2 punctate areas of enhancement in the right frontal lobe from previous MRI are not seen on today's study. No areas of enhancement are evident on the source images. Review of the MIP images confirms the above findings IMPRESSION: 1. Thrombus in the distal left M1 segment to the bifurcation. Flow is present in reduced caliber left MCA branch vessels. The thrombus is likely nonocclusive. Collateral vessels are also present. 2. Saddle embolus at the innominate artery bifurcation into the right common carotid artery and right subclavian artery. This embolus is nonocclusive. 3. Atherosclerotic changes at the carotid bifurcations and cavernous  internal carotid arteries bilaterally without significant stenosis. 4. Atherosclerotic calcifications with moderate stenosis in the proximal left vertebral artery. These results were called by telephone at the time of interpretation on 10/15/2016 at 12:30 pm to Dr. Leonel Ramsay , who verbally acknowledged these results. Electronically Signed   By: San Morelle M.D.   On: 10/15/2016 13:05   Ct Head Wo Contrast  Result Date: 10/15/2016 CLINICAL DATA:  Stroke.  Post endovascular revascularization EXAM: CT HEAD WITHOUT CONTRAST TECHNIQUE: Contiguous  axial images were obtained from the base of the skull through the vertex without intravenous contrast. COMPARISON:  CT head 10/07/2016 FINDINGS: Brain: Improvement in hypodensity in the left MCA territory compared with the earlier scan. There remains hypodensity in the left inferior temporal lobe extending into the insular cortex compatible with infarction. There is edema effacing the sylvian fissure on the left. Low-density in the frontal operculum has significantly improved as has the edema in the insular cortex. Negative for acute hemorrhage. Ventricle size normal. 2 mm midline shift to the right due to edema. Vascular: Normal arterial and venous enhancement due to prior cerebral angiogram. Skull: Negative Sinuses/Orbits: Mild mucosal edema paranasal sinuses. Other: None IMPRESSION: Improvement in left MCA hypodensity following clot retrieval. There remains low-density in the left inferior temporal lobe and insular cortex with edema which has improved since earlier today. No acute hemorrhage.  Mild midline shift to the right. Electronically Signed   By: Franchot Gallo M.D.   On: 10/15/2016 14:50   Ct Angio Neck W Or Wo Contrast  Result Date: 10/15/2016 CLINICAL DATA:  Acute onset right-sided weakness and abnormal speech. EXAM: CT ANGIOGRAPHY HEAD AND NECK TECHNIQUE: Multidetector CT imaging of the head and neck was performed using the standard protocol during bolus administration of intravenous contrast. Multiplanar CT image reconstructions and MIPs were obtained to evaluate the vascular anatomy. Carotid stenosis measurements (when applicable) are obtained utilizing NASCET criteria, using the distal internal carotid diameter as the denominator. CONTRAST:  90 mL Isovue 370 COMPARISON:  MRI brain 05/14/2016 FINDINGS: CTA NECK FINDINGS Aortic arch: Atherosclerotic calcifications are present at the aortic arch without compromise of the great vessel origins. There is no aneurysm. Right carotid system: The a saddle  embolus is present at bifurcation of the innominate artery and to the right common carotid artery and subclavian artery. This is nonocclusive. There is some nonocclusive thrombus extending more superiorly within the right common carotid artery is well. Atherosclerotic changes are present at the right carotid bifurcation without significant stenosis. Left carotid system: The left common carotid artery is within normal limits. Atherosclerotic changes are present at left carotid bifurcation. There is calcified plaque proximally. Noncalcified posterior plaque more distally narrows the with 3 mm. This compares with a distal measurement of 4.5 mm. Posterior calcification just below the skullbase may reflect a remote injury. There is no associated stenosis. Vertebral arteries: The vertebral arteries both originate from the subclavian arteries. There is no focal stenosis of the origin. The left vertebral artery the dominant vessel. There is no significant stenosis cannot air vertebral artery in the. Dense atherosclerotic calcifications narrowing of the left vertebral artery limiting to 1.5 mm. Skeleton: Vertebral body heights and alignment are normal. There is no focal lytic or blastic lesion. The patient is edentulous. Other neck: No significant cervical adenopathy is present. No focal mucosal or submucosal lesions are present. The submandibular and parotid glands are within normal limits bilaterally. Upper chest: A medial right upper lobe chest mass decreased in size from the previous  PET scan. The lesion measures 6.0 x 7.3 cm. There is postobstructive pneumonitis in the right upper lobe. A right pleural effusion is present. Ill-defined opacity in the superior segment of the left lower lobe is incompletely imaged. Review of the MIP images confirms the above findings CTA HEAD FINDINGS Anterior circulation: Atherosclerotic calcifications are present in precavernous cavernous internal carotid arteries bilaterally without a  significant stenosis relative to the more distal vessel. The terminus is intact bilaterally. The A1 segments are normal. The right M1 segment is normal. A nonocclusive thrombus is present in the distal left M1 segment at the MCA bifurcation. MCA branch vessels opacify the. They are of decreased caliber compared to the MCA branch vessels on the right. The area some atherosclerotic irregularity a MCA branch vessels. No focal occlusion is evident. Increased collaterals are evident on the left. Posterior circulation: Moderate stenosis is present in the dominant left vertebral artery at the dural margin. PICA origins are visualized and normal. The basilar artery is normal. Both posterior cerebral arteries originate from basilar tip. There is mild irregularity without focal stenosis or occlusion. Venous sinuses: Dural sinuses are patent. The left transverse sinus dominant. Anatomic variants: None Delayed phase: 2 punctate areas of enhancement in the right frontal lobe from previous MRI are not seen on today's study. No areas of enhancement are evident on the source images. Review of the MIP images confirms the above findings IMPRESSION: 1. Thrombus in the distal left M1 segment to the bifurcation. Flow is present in reduced caliber left MCA branch vessels. The thrombus is likely nonocclusive. Collateral vessels are also present. 2. Saddle embolus at the innominate artery bifurcation into the right common carotid artery and right subclavian artery. This embolus is nonocclusive. 3. Atherosclerotic changes at the carotid bifurcations and cavernous internal carotid arteries bilaterally without significant stenosis. 4. Atherosclerotic calcifications with moderate stenosis in the proximal left vertebral artery. These results were called by telephone at the time of interpretation on 10/15/2016 at 12:30 pm to Dr. Leonel Ramsay , who verbally acknowledged these results. Electronically Signed   By: San Morelle M.D.   On:  10/15/2016 13:05   Mr Brain Wo Contrast  Result Date: 10/16/2016 CLINICAL DATA:  66 y/o M; left MCA distribution infarct post revascularization. EXAM: MRI HEAD WITHOUT CONTRAST TECHNIQUE: Multiplanar, multiecho pulse sequences of the brain and surrounding structures were obtained without intravenous contrast. COMPARISON:  10/15/2016 CT angiogram of the head. FINDINGS: Brain: Diffusion restriction is present within the left insula, left anterior temporal lobe, and left lateral frontal lobe in distribution similar to CT perfusion CBF less than 30%. There is an additional area of diffusion restriction within the left parietal lobe not indicated on the perfusion study. Areas of infarction demonstrate T2 FLAIR hyperintense signal abnormality and mild local mass effect. Motion degraded sagittal T1 weighted sequence. Small chronic infarction is present within the right parietal cortex. There are mild chronic microvascular ischemic changes of white matter and mild brain parenchymal volume loss. No abnormal susceptibility hypointensity is present to indicate intracranial hemorrhage. No hydrocephalus or extra-axial collection. Vascular: Normal flow voids. Skull and upper cervical spine: Normal marrow signal. Sinuses/Orbits: Negative. Other: None. IMPRESSION: Left MCA distribution diffusion restriction is present in the left insula, left anterior temporal lobe, and left lateral frontal lobe in distribution similar to CT perfusion CBF less than 30%. There is a small additional area of diffusion restriction within the left parietal lobe not indicated on the perfusion study. Findings are compatible with acute infarction. No associated  hemorrhage identified. Electronically Signed   By: Kristine Garbe M.D.   On: 10/16/2016 04:49   Ir Angiogram Extremity Right  Result Date: 10/16/2016 INDICATION: Global aphasia. Right-sided weakness. CT perfusion examination revealed mismatch volume of 131 mL with the CBF < 30%  volume of 43 mL, and a mismatch ratio of 4.0. Large near complete occlusive filling defect in the distal right middle cerebral artery extending into the bifurcation middle cerebral artery just proximal to the bifurcation and extending into the bifurcation. EXAM: 1. EMERGENT LARGE VESSEL OCCLUSION THROMBOLYSIS (anterior CIRCULATION) COMPARISON:  CT angiogram of 10/15/2016. MEDICATIONS: Ancef 2 g IV. The antibiotic was administered within 1 hour of the procedure. ANESTHESIA/SEDATION: General anesthesia. CONTRAST:  Isovue 300 approximately 60 mL. FLUOROSCOPY TIME:  Fluoroscopy Time: 10 minutes 12 seconds (837 mGy). COMPLICATIONS: None immediate. TECHNIQUE: Following a full explanation of the procedure along with the potential associated complications, an informed witnessed consent was obtained. The risks of intracranial hemorrhage of 10%, worsening neurological deficit, ventilator dependency, death and inability to revascularize were all reviewed in detail with the patient's wife. The patient was then put under general anesthesia by the Department of Anesthesiology at [facility]. The right groin was prepped and draped in the usual sterile fashion. Thereafter using modified Seldinger technique, transfemoral access into the right common femoral artery was obtained without difficulty. Over a 0.035 inch guidewire a 5 French Pinnacle sheath was inserted. Through this, and also over a 0.035 inch guidewire a 5 [place] JB 1 catheter was advanced to the aortic arch region and selectively positioned in the innominate artery and the left common carotid artery. FINDINGS: The innominate arteriogram demonstrates a long segment lobulated filling defect which extends from the mid innominate artery across the origin of the right common carotid artery and the right vertebral artery. No angiographic flow is noted in the right vertebral artery. However, there is flow noted in the right common carotid artery. The right common  carotid artery on the lateral projection demonstrates wide patency at the right carotid artery bifurcation to the supraclinoid segment with opacification of the visualized right MCA distribution on the lateral projection. The left common carotid arteriogram demonstrates the origin of the left external carotid artery to be patent. The opacified portions of the left external carotid artery appear patent. The left internal carotid artery at the bulb to the cranial skull base demonstrates wide patency, with a small shelf-like plaque noted along the posterior wall of the left internal carotid artery at the distal aspect of the bulb. The left internal carotid artery is seen to opacify to the cranial skull base. The petrous segment is widely patent. There is a focal stenoses of approximately 30% of the caval cavernous segment of the left internal carotid artery. Distal to this, the distal cavernous and the supraclinoid segments are widely patent. The left middle cerebral artery in its M1 segment demonstrates patency. There is attenuated caliber in the distal left M1 segment with near complete occlusion extending into the inferior division with near complete occlusion of the superior division. Multiple filling defects are seen in this region. The left anterior cerebral artery is seen to opacify normally into the capillary and venous phases. The delayed arterial phase demonstrates partial retrograde opacification of the perisylvian branches from the pericallosal and callosal marginal branches. Also noted is prompt opacification via the anterior communicating artery of the right anterior cerebral A2 segment and the right anterior cerebral A1 segment. Opacification of the right middle cerebral artery M1 segment  and distally is also noted from the left common carotid artery injection. PROCEDURE: The diagnostic JB 1 catheter in the left common carotid artery was then exchanged over a 0.035 inch 300 cm Rosen exchange guidewire  for a 55 cm 8 French Brite tip neurovascular sheath using biplane roadmap technique and constant fluoroscopic guidance. Good aspiration was obtained from the hub of the 8 French neurovascular sheath. This was then connected to continuous heparinized saline infusion. Over the [employer] guidewire, a 95 cm 8 [place] FlowGate balloon guide catheter which been prepped with 50% contrast and 50% heparinized saline infusion was then advanced and positioned in the left common carotid artery. The guidewire was removed. Good aspiration was obtained from the hub of the 8 [place] FlowGate guide catheter. A gentle constant injection demonstrated no evidence of spasms, dissections or of intraluminal filling defects. Over a 0.035 inch Roadrunner guidewire, using biplane roadmap technique and constant fluoroscopic guidance, the 8 [place] FlowGate guide catheter was then advanced to the cervical petrous junction of the left internal carotid artery. The guidewire was removed. Good aspiration was obtained from the hub of the 8 [place] FlowGate balloon guide catheter. A gentle contrast injection demonstrated no evidence spasms, dissections or of intraluminal filling defects. At this time, in a coaxial manner and with constant heparinized saline infusion using biplane roadmap technique and constant fluoroscopic guidance, a Trevo ProVue 021 microcatheter was advanced over a 0.014 inch Softip Synchro micro guidewire to the distal end of the 8 [place] FlowGate guide catheter. With the micro guidewire leading with a J-tip configuration, the combination was navigated without difficulty to the supraclinoid left ICA. A torque device was then utilized to advance the micro guidewire to the left middle cerebral artery followed by the microcatheter. The micro guidewire was then advanced without difficulty through the inferior division of left middle cerebral artery into the M2 M3 region followed by the microcatheter. The micro guidewire was  removed. Good aspiration was obtained from the hub of the microcatheter. Gentle contrast injection demonstrated a brisk antegrade flow distally. A 4 mm x 40 mm Solitaire FR retrieval device was then purged with 50% contrast and 50% heparinized saline infusion. Thereafter this was advanced again in a coaxial manner and with constant heparinized saline infusion using biplane roadmap technique and constant fluoroscopic guidance to the distal end of the microcatheter. The O ring on the delivery microcatheter was then loosened. With slight forward gentle traction with the right hand on the delivery micro guidewire with the left hand the delivery microcatheter was retrieved unsheathing the distal end and then the proximal portion of the retrieval device. The tip of the microcatheter was just proximal to the proximal portion of the retrieval device. A brisk control arteriogram performed through the 8 [place] FlowGate guide catheter in the left internal carotid artery demonstrated brisk flow through the inferior division and partially through the superior division though improved. A TICI 2b reperfusion was noted. The balloon in the distal left internal carotid artery FlowGate guide catheter was then inflated for proximal flow arrest. The proximal portion of the retrieval device was then captured into the microcatheter. There on after with constant aspiration being applied with a 60 mL syringe at the hub of the [facility] guide catheter, the combination of the retrieval device and the microcatheter were retrieved and removed. Aspiration was continued as the balloon was deflated. Free back bleed was noted at the hub of 8 [place] FlowGate guide catheter. The aspirate contained 2 chunks of clots. Also  noted in the [employer] was a another piece of mixed bloody and fibrosis clot. A control arteriogram performed through the 8 [place] FlowGate guide catheter in the left internal carotid artery demonstrated complete angiographic  revascularization of the occluded left middle cerebral artery distribution. No angiographic evidence of filling defects or occlusions or stenosis was seen. No evidence of extravasation, or mass-effect on the major vessel was noted. Moderate spasm was noted in the inferior division of the left middle cerebral artery which responded promptly to 2 aliquots of 25 mics of nitroglycerin given through the [facility] guide catheter. A final control arteriogram performed through the [facility] guide catheter in the left internal carotid artery demonstrated complete angiographic revascularization of the left MCA distribution. The left anterior cerebral artery appeared patent with brisk flow into the contralateral cerebral hemisphere as described previously. The patient's neurological status and hemodynamic status remained stable throughout the procedure. The 8 [place] FlowGate guide catheter and the 8 [place] Brite tip neurovascular sheath were retrieved in the abdominal aorta and exchanged over a J-tip guidewire for an 8 [place] Pinnacle sheath. This was then removed successfully with the application of an external closure device with compression at the puncture site for 20 minutes. The groin site appeared soft without evidence of a hematoma. The distal pulses remained stable palpable bilaterally and unchanged compared to prior to the procedure. IMPRESSION: Status post endovascular complete revascularization of left MCA occlusion with 1 pass with the Solitaire FR 4 mm x 40 mm retrieval device achieving a TICI 3 reperfusion. Groin puncture time 13:12. Revascularization with TICI 2b at 13:35. Revascularization with TICI 3 at 13:39. PLAN: Patient to CT scanner for postprocedural CT scan of brain. Electronically Signed   By: Luanne Bras M.D.   On: 10/15/2016 21:34   Ct Cerebral Perfusion W Contrast  Result Date: 10/15/2016 CLINICAL DATA:  Acute onset of right sided weakness and abnormal speech. Lung cancer. EXAM: CT  PERFUSION BRAIN TECHNIQUE: Multiphase CT imaging of the brain was performed following IV bolus contrast injection. Subsequent parametric perfusion maps were calculated using RAPID software. CONTRAST:  90 mL Isovue 370 COMPARISON:  MRI brain 05/14/2016. FINDINGS: CT Brain Perfusion Findings: CBF (<30%) Volume: 28m Perfusion (Tmax>6.0s) volume: 1723mMismatch Volume: 13168mnfarction Location:Left MCA territory. Arterial and venous and was are excellent. Minimal patient motion is evident. IMPRESSION: 1. The acute infarct involving the left MCA territory with estimated volume of CT a less than 30% at 43 mL. 2. Mismatch volume 131 mL. These results were called by telephone at the time of interpretation on 10/15/2016 at 12:30 pm to Dr. KIRLeonel Ramsaywho verbally acknowledged these results. Electronically Signed   By: ChrSan MorelleD.   On: 10/15/2016 12:39   Dg Chest Port 1 View  Result Date: 10/16/2016 CLINICAL DATA:  Ventilator dependent respiratory failure. History of CVA, COPD, lung malignancy, acute right rib fractures. EXAM: PORTABLE CHEST 1 VIEW COMPARISON:  Portable chest x-ray of October 15, 2016 FINDINGS: The endotracheal tube tip lies approximately 1.5 cm above the superior margin of the clavicular heads. The lungs are reasonably well inflated. A large right hilar mass is stable. Multiple pulmonary parenchymal masses in the mid and lower left lung also are stable. There is no pneumothorax or pleural effusion. Fractures of the lateral aspects of the right sixth and seventh ribs are again observed. The heart and pulmonary vascularity are normal. IMPRESSION: High positioning of the endotracheal tube. Advancement by at least 5 cm would be useful. The remainder the  findings in the chest are stable. Electronically Signed   By: Derico  Martinique M.D.   On: 10/16/2016 07:43   Dg Chest Port 1 View  Result Date: 10/15/2016 CLINICAL DATA:  Status post intubation EXAM: PORTABLE CHEST 1 VIEW COMPARISON:   04/28/2016 FINDINGS: Cardiac shadow is within normal limits. Multiple pulmonary mass lesions are noted particularly in the region of the right hilum but scattered throughout both lungs. An endotracheal tube is noted in satisfactory position. No pneumothorax is noted. Multiple right rib fractures are seen. IMPRESSION: Multiple right rib fractures. Endotracheal tube in satisfactory position. Changes consistent with lung carcinoma and multiple scattered pulmonary lesions. These have progressed in the interval from the prior exam of 05/02/2016 Electronically Signed   By: Inez Catalina M.D.   On: 10/15/2016 16:18   Ir Percutaneous Art Thrombectomy/infusion Intracranial Inc Diag Angio  Result Date: 10/16/2016 INDICATION: Global aphasia. Right-sided weakness. CT perfusion examination revealed mismatch volume of 131 mL with the CBF < 30% volume of 43 mL, and a mismatch ratio of 4.0. Large near complete occlusive filling defect in the distal right middle cerebral artery extending into the bifurcation middle cerebral artery just proximal to the bifurcation and extending into the bifurcation. EXAM: 1. EMERGENT LARGE VESSEL OCCLUSION THROMBOLYSIS (anterior CIRCULATION) COMPARISON:  CT angiogram of 10/15/2016. MEDICATIONS: Ancef 2 g IV. The antibiotic was administered within 1 hour of the procedure. ANESTHESIA/SEDATION: General anesthesia. CONTRAST:  Isovue 300 approximately 60 mL. FLUOROSCOPY TIME:  Fluoroscopy Time: 10 minutes 12 seconds (837 mGy). COMPLICATIONS: None immediate. TECHNIQUE: Following a full explanation of the procedure along with the potential associated complications, an informed witnessed consent was obtained. The risks of intracranial hemorrhage of 10%, worsening neurological deficit, ventilator dependency, death and inability to revascularize were all reviewed in detail with the patient's wife. The patient was then put under general anesthesia by the Department of Anesthesiology at [facility].  The right groin was prepped and draped in the usual sterile fashion. Thereafter using modified Seldinger technique, transfemoral access into the right common femoral artery was obtained without difficulty. Over a 0.035 inch guidewire a 5 French Pinnacle sheath was inserted. Through this, and also over a 0.035 inch guidewire a 5 [place] JB 1 catheter was advanced to the aortic arch region and selectively positioned in the innominate artery and the left common carotid artery. FINDINGS: The innominate arteriogram demonstrates a long segment lobulated filling defect which extends from the mid innominate artery across the origin of the right common carotid artery and the right vertebral artery. No angiographic flow is noted in the right vertebral artery. However, there is flow noted in the right common carotid artery. The right common carotid artery on the lateral projection demonstrates wide patency at the right carotid artery bifurcation to the supraclinoid segment with opacification of the visualized right MCA distribution on the lateral projection. The left common carotid arteriogram demonstrates the origin of the left external carotid artery to be patent. The opacified portions of the left external carotid artery appear patent. The left internal carotid artery at the bulb to the cranial skull base demonstrates wide patency, with a small shelf-like plaque noted along the posterior wall of the left internal carotid artery at the distal aspect of the bulb. The left internal carotid artery is seen to opacify to the cranial skull base. The petrous segment is widely patent. There is a focal stenoses of approximately 30% of the caval cavernous segment of the left internal carotid artery. Distal to this, the  distal cavernous and the supraclinoid segments are widely patent. The left middle cerebral artery in its M1 segment demonstrates patency. There is attenuated caliber in the distal left M1 segment with near complete  occlusion extending into the inferior division with near complete occlusion of the superior division. Multiple filling defects are seen in this region. The left anterior cerebral artery is seen to opacify normally into the capillary and venous phases. The delayed arterial phase demonstrates partial retrograde opacification of the perisylvian branches from the pericallosal and callosal marginal branches. Also noted is prompt opacification via the anterior communicating artery of the right anterior cerebral A2 segment and the right anterior cerebral A1 segment. Opacification of the right middle cerebral artery M1 segment and distally is also noted from the left common carotid artery injection. PROCEDURE: The diagnostic JB 1 catheter in the left common carotid artery was then exchanged over a 0.035 inch 300 cm Rosen exchange guidewire for a 55 cm 8 [place] Brite tip neurovascular sheath using biplane roadmap technique and constant fluoroscopic guidance. Good aspiration was obtained from the hub of the 8 French neurovascular sheath. This was then connected to continuous heparinized saline infusion. Over the [employer] guidewire, a 95 cm 8 [place] FlowGate balloon guide catheter which been prepped with 50% contrast and 50% heparinized saline infusion was then advanced and positioned in the left common carotid artery. The guidewire was removed. Good aspiration was obtained from the hub of the 8 [place] FlowGate guide catheter. A gentle constant injection demonstrated no evidence of spasms, dissections or of intraluminal filling defects. Over a 0.035 inch Roadrunner guidewire, using biplane roadmap technique and constant fluoroscopic guidance, the 8 [place] FlowGate guide catheter was then advanced to the cervical petrous junction of the left internal carotid artery. The guidewire was removed. Good aspiration was obtained from the hub of the 8 [place] FlowGate balloon guide catheter. A gentle contrast injection demonstrated  no evidence spasms, dissections or of intraluminal filling defects. At this time, in a coaxial manner and with constant heparinized saline infusion using biplane roadmap technique and constant fluoroscopic guidance, a Trevo ProVue 021 microcatheter was advanced over a 0.014 inch Softip Synchro micro guidewire to the distal end of the 8 [place] FlowGate guide catheter. With the micro guidewire leading with a J-tip configuration, the combination was navigated without difficulty to the supraclinoid left ICA. A torque device was then utilized to advance the micro guidewire to the left middle cerebral artery followed by the microcatheter. The micro guidewire was then advanced without difficulty through the inferior division of left middle cerebral artery into the M2 M3 region followed by the microcatheter. The micro guidewire was removed. Good aspiration was obtained from the hub of the microcatheter. Gentle contrast injection demonstrated a brisk antegrade flow distally. A 4 mm x 40 mm Solitaire FR retrieval device was then purged with 50% contrast and 50% heparinized saline infusion. Thereafter this was advanced again in a coaxial manner and with constant heparinized saline infusion using biplane roadmap technique and constant fluoroscopic guidance to the distal end of the microcatheter. The O ring on the delivery microcatheter was then loosened. With slight forward gentle traction with the right hand on the delivery micro guidewire with the left hand the delivery microcatheter was retrieved unsheathing the distal end and then the proximal portion of the retrieval device. The tip of the microcatheter was just proximal to the proximal portion of the retrieval device. A brisk control arteriogram performed through the 8 [place] FlowGate guide catheter  in the left internal carotid artery demonstrated brisk flow through the inferior division and partially through the superior division though improved. A TICI 2b reperfusion  was noted. The balloon in the distal left internal carotid artery FlowGate guide catheter was then inflated for proximal flow arrest. The proximal portion of the retrieval device was then captured into the microcatheter. There on after with constant aspiration being applied with a 60 mL syringe at the hub of the [facility] guide catheter, the combination of the retrieval device and the microcatheter were retrieved and removed. Aspiration was continued as the balloon was deflated. Free back bleed was noted at the hub of 8 [place] FlowGate guide catheter. The aspirate contained 2 chunks of clots. Also noted in the [employer] was a another piece of mixed bloody and fibrosis clot. A control arteriogram performed through the 8 [place] FlowGate guide catheter in the left internal carotid artery demonstrated complete angiographic revascularization of the occluded left middle cerebral artery distribution. No angiographic evidence of filling defects or occlusions or stenosis was seen. No evidence of extravasation, or mass-effect on the major vessel was noted. Moderate spasm was noted in the inferior division of the left middle cerebral artery which responded promptly to 2 aliquots of 25 mics of nitroglycerin given through the [facility] guide catheter. A final control arteriogram performed through the [facility] guide catheter in the left internal carotid artery demonstrated complete angiographic revascularization of the left MCA distribution. The left anterior cerebral artery appeared patent with brisk flow into the contralateral cerebral hemisphere as described previously. The patient's neurological status and hemodynamic status remained stable throughout the procedure. The 8 [place] FlowGate guide catheter and the 8 [place] Brite tip neurovascular sheath were retrieved in the abdominal aorta and exchanged over a J-tip guidewire for an 8 [place] Pinnacle sheath. This was then removed successfully with the application of an external  closure device with compression at the puncture site for 20 minutes. The groin site appeared soft without evidence of a hematoma. The distal pulses remained stable palpable bilaterally and unchanged compared to prior to the procedure. IMPRESSION: Status post endovascular complete revascularization of left MCA occlusion with 1 pass with the Solitaire FR 4 mm x 40 mm retrieval device achieving a TICI 3 reperfusion. Groin puncture time 13:12. Revascularization with TICI 2b at 13:35. Revascularization with TICI 3 at 13:39. PLAN: Patient to CT scanner for postprocedural CT scan of brain. Electronically Signed   By: Luanne Bras M.D.   On: 10/15/2016 21:34   Dg Hip Unilat With Pelvis 2-3 Views Left  Result Date: 10/10/2016 CLINICAL DATA:  Left hip pain, history of metastatic lung cancer. EXAM: DG HIP (WITH OR WITHOUT PELVIS) 2-3V LEFT COMPARISON:  None. FINDINGS: The hip joints appear normal. However, large lytic lesion is seen involving the left superior and inferior pubic rami consistent with metastatic lesion. Sacroiliac joints appear normal. IMPRESSION: Large lytic lesion seen involving the left superior and inferior pubic rami consistent with metastatic lesion. These results will be called to the ordering clinician or representative by the Radiologist Assistant, and communication documented in the PACS or zVision Dashboard. Electronically Signed   By: Marijo Conception, M.D.   On: 10/10/2016 13:45   Ct Head Code Stroke W/o Cm  Result Date: 10/15/2016 CLINICAL DATA:  Code stroke. Acute onset of right-sided weakness. Difficulty with speech. On lung cancer. EXAM: CT HEAD WITHOUT CONTRAST TECHNIQUE: Contiguous axial images were obtained from the base of the skull through the vertex without intravenous contrast.  COMPARISON:  None. FINDINGS: Brain: Areas of hypoattenuation involve the left insular cortex. Additional hypoattenuation is present in the left frontal operculum and the super ganglionic left frontal  lobe. Decreased attenuation is present within the left temporal tip. There is no hemorrhage or mass lesion. Ventricles are normal size. No other acute infarct present. Vascular: Atherosclerotic calcifications are present at the cavernous internal carotid arteries and left greater than right vertebral arteries without a hyperdense vessel. Skull: No focal lytic or blastic lesions are present. Sinuses/Orbits: Paranasal sinuses and mastoid air cells are clear. ASPECTS [facility] Stroke Program Early CT Score) - Ganglionic level infarction (caudate, lentiform nuclei, internal capsule, insula, M1-M3 cortex): 5/7 - Supraganglionic infarction (M4-M6 cortex): 2/3 Total score (0-10 with 10 being normal): 7/10 IMPRESSION: 1. Acute nonhemorrhagic infarct involving the left MCA territory with involvement of the left insular cortex, left frontal operculum, left temporal tip, and left super ganglionic frontal lobe. 2. ASPECTS is 7/10 These results were called by telephone at the time of interpretation on 10/15/2016 at 12:30 pm to Dr. Leonel Ramsay , who verbally acknowledged these results. Electronically Signed   By: San Morelle M.D.   On: 10/15/2016 12:33   Ir Angio Intra Extracran Sel Com Carotid Innominate Uni R Mod Sed  Result Date: 10/16/2016 INDICATION: Global aphasia. Right-sided weakness. CT perfusion examination revealed mismatch volume of 131 mL with the CBF < 30% volume of 43 mL, and a mismatch ratio of 4.0. Large near complete occlusive filling defect in the distal right middle cerebral artery extending into the bifurcation middle cerebral artery just proximal to the bifurcation and extending into the bifurcation. EXAM: 1. EMERGENT LARGE VESSEL OCCLUSION THROMBOLYSIS (anterior CIRCULATION) COMPARISON:  CT angiogram of 10/15/2016. MEDICATIONS: Ancef 2 g IV. The antibiotic was administered within 1 hour of the procedure. ANESTHESIA/SEDATION: General anesthesia. CONTRAST:  Isovue 300 approximately 60 mL.  FLUOROSCOPY TIME:  Fluoroscopy Time: 10 minutes 12 seconds (837 mGy). COMPLICATIONS: None immediate. TECHNIQUE: Following a full explanation of the procedure along with the potential associated complications, an informed witnessed consent was obtained. The risks of intracranial hemorrhage of 10%, worsening neurological deficit, ventilator dependency, death and inability to revascularize were all reviewed in detail with the patient's wife. The patient was then put under general anesthesia by the Department of Anesthesiology at [facility]. The right groin was prepped and draped in the usual sterile fashion. Thereafter using modified Seldinger technique, transfemoral access into the right common femoral artery was obtained without difficulty. Over a 0.035 inch guidewire a 5 French Pinnacle sheath was inserted. Through this, and also over a 0.035 inch guidewire a 5 [place] JB 1 catheter was advanced to the aortic arch region and selectively positioned in the innominate artery and the left common carotid artery. FINDINGS: The innominate arteriogram demonstrates a long segment lobulated filling defect which extends from the mid innominate artery across the origin of the right common carotid artery and the right vertebral artery. No angiographic flow is noted in the right vertebral artery. However, there is flow noted in the right common carotid artery. The right common carotid artery on the lateral projection demonstrates wide patency at the right carotid artery bifurcation to the supraclinoid segment with opacification of the visualized right MCA distribution on the lateral projection. The left common carotid arteriogram demonstrates the origin of the left external carotid artery to be patent. The opacified portions of the left external carotid artery appear patent. The left internal carotid artery at the bulb to the cranial skull base  demonstrates wide patency, with a small shelf-like plaque noted along the  posterior wall of the left internal carotid artery at the distal aspect of the bulb. The left internal carotid artery is seen to opacify to the cranial skull base. The petrous segment is widely patent. There is a focal stenoses of approximately 30% of the caval cavernous segment of the left internal carotid artery. Distal to this, the distal cavernous and the supraclinoid segments are widely patent. The left middle cerebral artery in its M1 segment demonstrates patency. There is attenuated caliber in the distal left M1 segment with near complete occlusion extending into the inferior division with near complete occlusion of the superior division. Multiple filling defects are seen in this region. The left anterior cerebral artery is seen to opacify normally into the capillary and venous phases. The delayed arterial phase demonstrates partial retrograde opacification of the perisylvian branches from the pericallosal and callosal marginal branches. Also noted is prompt opacification via the anterior communicating artery of the right anterior cerebral A2 segment and the right anterior cerebral A1 segment. Opacification of the right middle cerebral artery M1 segment and distally is also noted from the left common carotid artery injection. PROCEDURE: The diagnostic JB 1 catheter in the left common carotid artery was then exchanged over a 0.035 inch 300 cm Rosen exchange guidewire for a 55 cm 8 [place] Brite tip neurovascular sheath using biplane roadmap technique and constant fluoroscopic guidance. Good aspiration was obtained from the hub of the 8 French neurovascular sheath. This was then connected to continuous heparinized saline infusion. Over the [employer] guidewire, a 95 cm 8 [place] FlowGate balloon guide catheter which been prepped with 50% contrast and 50% heparinized saline infusion was then advanced and positioned in the left common carotid artery. The guidewire was removed. Good aspiration was obtained from  the hub of the 8 [place] FlowGate guide catheter. A gentle constant injection demonstrated no evidence of spasms, dissections or of intraluminal filling defects. Over a 0.035 inch Roadrunner guidewire, using biplane roadmap technique and constant fluoroscopic guidance, the 8 [place] FlowGate guide catheter was then advanced to the cervical petrous junction of the left internal carotid artery. The guidewire was removed. Good aspiration was obtained from the hub of the 8 [place] FlowGate balloon guide catheter. A gentle contrast injection demonstrated no evidence spasms, dissections or of intraluminal filling defects. At this time, in a coaxial manner and with constant heparinized saline infusion using biplane roadmap technique and constant fluoroscopic guidance, a Trevo ProVue 021 microcatheter was advanced over a 0.014 inch Softip Synchro micro guidewire to the distal end of the 8 [place] FlowGate guide catheter. With the micro guidewire leading with a J-tip configuration, the combination was navigated without difficulty to the supraclinoid left ICA. A torque device was then utilized to advance the micro guidewire to the left middle cerebral artery followed by the microcatheter. The micro guidewire was then advanced without difficulty through the inferior division of left middle cerebral artery into the M2 M3 region followed by the microcatheter. The micro guidewire was removed. Good aspiration was obtained from the hub of the microcatheter. Gentle contrast injection demonstrated a brisk antegrade flow distally. A 4 mm x 40 mm Solitaire FR retrieval device was then purged with 50% contrast and 50% heparinized saline infusion. Thereafter this was advanced again in a coaxial manner and with constant heparinized saline infusion using biplane roadmap technique and constant fluoroscopic guidance to the distal end of the microcatheter. The O ring on the  delivery microcatheter was then loosened. With slight forward gentle  traction with the right hand on the delivery micro guidewire with the left hand the delivery microcatheter was retrieved unsheathing the distal end and then the proximal portion of the retrieval device. The tip of the microcatheter was just proximal to the proximal portion of the retrieval device. A brisk control arteriogram performed through the 8 [place] FlowGate guide catheter in the left internal carotid artery demonstrated brisk flow through the inferior division and partially through the superior division though improved. A TICI 2b reperfusion was noted. The balloon in the distal left internal carotid artery FlowGate guide catheter was then inflated for proximal flow arrest. The proximal portion of the retrieval device was then captured into the microcatheter. There on after with constant aspiration being applied with a 60 mL syringe at the hub of the [facility] guide catheter, the combination of the retrieval device and the microcatheter were retrieved and removed. Aspiration was continued as the balloon was deflated. Free back bleed was noted at the hub of 8 [place] FlowGate guide catheter. The aspirate contained 2 chunks of clots. Also noted in the Tuohy Eino Farber was a another piece of mixed bloody and fibrosis clot. A control arteriogram performed through the 8 [place] FlowGate guide catheter in the left internal carotid artery demonstrated complete angiographic revascularization of the occluded left middle cerebral artery distribution. No angiographic evidence of filling defects or occlusions or stenosis was seen. No evidence of extravasation, or mass-effect on the major vessel was noted. Moderate spasm was noted in the inferior division of the left middle cerebral artery which responded promptly to 2 aliquots of 25 mics of nitroglycerin given through the [facility] guide catheter. A final control arteriogram performed through the [facility] guide catheter in the left internal carotid artery demonstrated complete  angiographic revascularization of the left MCA distribution. The left anterior cerebral artery appeared patent with brisk flow into the contralateral cerebral hemisphere as described previously. The patient's neurological status and hemodynamic status remained stable throughout the procedure. The 8 [place] FlowGate guide catheter and the 8 [place] Brite tip neurovascular sheath were retrieved in the abdominal aorta and exchanged over a J-tip guidewire for an 8 [place] Pinnacle sheath. This was then removed successfully with the application of an external closure device with compression at the puncture site for 20 minutes. The groin site appeared soft without evidence of a hematoma. The distal pulses remained stable palpable bilaterally and unchanged compared to prior to the procedure. IMPRESSION: Status post endovascular complete revascularization of left MCA occlusion with 1 pass with the Solitaire FR 4 mm x 40 mm retrieval device achieving a TICI 3 reperfusion. Groin puncture time 13:12. Revascularization with TICI 2b at 13:35. Revascularization with TICI 3 at 13:39. PLAN: Patient to CT scanner for postprocedural CT scan of brain. Electronically Signed   By: Luanne Bras M.D.   On: 10/15/2016 21:34   2D Echocardiogram pending  EKG sinus rhythm. For complete results please see formal report.  PHYSICAL EXAM Vitals:   10/16/16 1215 10/16/16 1230 10/16/16 1245 10/16/16 1300  BP: (!) 144/81 (!) 143/78 (!) 152/74 139/69  Pulse: 92 89 88 94  Resp: 20 (!) 21 20 (!) 23  Temp:      TempSrc:      SpO2: 100% 100% 100% 100%   General: Vital signs reviewed.  Patient is Chronically ill appearing, appears older than stated age, intubated in no acute distress and cooperative with exam.  Cardiovascular: RRR Pulmonary/Chest: Coarse breath  sounds, rhonchorous, no wheezes  Abdominal: Soft, non-tender, non-distended, BS +t.  Extremities: No lower extremity edema bilaterally, pulses symmetric and intact  bilaterally.  Neurological: Awake, alert. Off of sedation. Patient moves all extremities. 4/5 weakness in right upper extremity, right lower extremity, left lower extremity. 5 out of 5 in left upper extremity. Normal sensation throughout. Patient follows commands, but some evidence of receptive aphasia. Follows midline commands, but not peripheral commands. Extraocular muscles intact, PERRLA   ASSESSMENT/PLAN Mr. PIETER FOOKS is a 66 y.o. male with history of hypertension, type 2 diabetes, hyperlipidemia, CK D, COPD, stage IV lung cancer undergoing active treatment admitted for achalasia, right sided weakness and facial droop and found to have an acute left MCA CVA status post revascularization.   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   CT Perfusion: The acute infarct involving the left MCA territory with estimated volume of CT a less than 30% at 43 mL. Mismatch volume 131 mL.  CT Angio head/neck: Thrombus in the distal left M1 segment to the bifurcation. Flow is present in reduced caliber left MCA branch vessels. The thrombus is likely nonocclusive. Saddle embolus at the innominate artery bifurcation into the right common carotid artery and right subclavian artery. This embolus is nonocclusive.  CT Head: Improvement in left MCA hypodensity following clot retrieval.   Echo: pending  LDL 59  HgbA1c pending  Heparin for VTE prophylaxis  Diet NPO time specified   aspirin 81 mg daily prior to admission, now on aspirin 325 mg daily  Patient will be counseled to be compliant with his antithrombotic medications  Ongoing aggressive stroke risk factor management  Therapy recommendations:  pending  Disposition:  Pending  Left Common Carotid Thrombus Non-Occlusive  s/p Revascularization with IR Right Common Carotid Thrombus and Right Subclavian Thrombus and Stenosis Non-Occlusive  CT Angio head/neck: Thrombus in the distal left M1 segment to the bifurcation. Flow is present in reduced caliber left MCA branch vessels. The thrombus is likely nonocclusive. Saddle embolus at the innominate artery bifurcation into the right common carotid artery and right subclavian artery. This embolus is nonocclusive.  Vascular Surgery consulted for consideration of thrombectomy of Rt CCA and Subclavian thrombus  Plan for thrombectomy this afternoon  Start low dose IV heparin- do not bolus, run at low rate given risk of hemorrhagic conversion of CVA  Diabetes  HgbA1c pending goal < 7.0  Currently on SSI  CBG monitoring  DM education  Hypertension  Home meds:   Amlodipine 5 mg daily, atenolol 100 mg daily, lisinopril 40 mg daily Keep systolic blood pressure 0:93-818 Currently on labetalol when necessary and Cleviprex  Stable  Patient will be counseled to be compliant with his blood pressure medications  Hyperlipidemia  Home meds:  Atorvastatin 10 mg daily  Currently on atorvastatin 10 mg daily  LDL 59, goal < 70  Continue statin at discharge  Other Stroke Risk Factors  History of tobacco abuse  Active malignancy  Other Active Problems  Chronic normocytic anemia-hemoglobin 8.4  Mild leukocytosis of 11.9, afebrile  Intubated given IR procedure-we will keep patient intubated for upcoming thrombectomy, can likely be extubated soon after  Other Pertinent History  Stage IV Lung Cancer: Has been treated with carboplatin and Taxol, given new evidence of metastasis to left  hip, plans are to start radiation therapy  Hospital day # [place], DO PGY-3 Internal Medicine Resident Pager # [phone] 10/16/2016 1:13 PM  To contact Stroke Continuity provider, please refer to [email or web address] After hours, contact General Neurology

## 2016-10-16 NOTE — Transfer of Care (Addendum)
Immediate Anesthesia Transfer of Care Note  Patient: Ralph Lopez  Procedure(s) Performed: Procedure(s): RIGHT CAROTID-SUBCLAVIAN THROMBECTOMY (Right)  Patient Location: ICU  Anesthesia Type:General  Level of Consciousness: Patient remains intubated per anesthesia plan  Airway & Oxygen Therapy: Patient remains intubated per anesthesia plan and Patient placed on Ventilator (see vital sign flow sheet for setting)  Post-op Assessment: Report given to RN and Post -op Vital signs reviewed and stable  Post vital signs: Reviewed and stable  Last Vitals:  Vitals:   10/16/16 1245 10/16/16 1300  BP: (!) 152/74 139/69  Pulse: 88 94  Resp: 20 (!) 23  Temp:      Last Pain:  Vitals:   10/16/16 1200  TempSrc: Oral  PainSc:          Complications: No apparent anesthesia complications   Report to RN at bedside. VSS.  Tolerated transport well.  Titrated neosynephrine down during transport to maintain a systolic BP between 832-549.

## 2016-10-16 NOTE — Progress Notes (Signed)
While pt in MRI scanner, difficult to get accurate scan because pt continued to move after increasing propofol gtt multiple times. At that time, called [facility] via [place] to request a medication to help with obtaining MRI scan. Given order for '4mg'$  of versed, put in by MD, and given while in MRI.   Continued to monitor and increase propofol gtt, as pt continued to move after the one time dose of versed.  After multiple attempts, finally got pt still enough to successfully get MRI scan.  Guadalupe Maple, RN

## 2016-10-16 NOTE — Anesthesia Procedure Notes (Signed)
Procedure Name: Intubation Date/Time: 10/16/2016 3:30 PM Performed by: Jenne Campus Pre-anesthesia Checklist: Patient identified, Emergency Drugs available, Suction available, Patient being monitored and Timeout performed Patient Re-evaluated:Patient Re-evaluated prior to inductionOxygen Delivery Method: Circle system utilized Preoxygenation: Pre-oxygenation with 100% oxygen Grade View: Grade I Tube type: Subglottic suction tube Tube size: 7.5 mm Number of attempts: 1 Airway Equipment and Method: Bougie stylet and Video-laryngoscopy Placement Confirmation: ETT inserted through vocal cords under direct vision,  positive ETCO2 and breath sounds checked- equal and bilateral Secured at: 22 cm Tube secured with: Tape Dental Injury: Teeth and Oropharynx as per pre-operative assessment and Bloody posterior oropharynx  Comments: Need to exchange ETT due to cuff leak. DL x 1 MAC4 video-glide. Grade 1 view. Existing ETT in proper positioning through cords. Bougie stylet threaded down existing ETT. ETT pulled out and atraumatic intubation with new ETT threaded over bougie stylet and through vocal cords. ETT placement verified visually with video-glide and BBSE. +ETCO2.

## 2016-10-16 NOTE — Progress Notes (Signed)
OT Cancellation Note  Patient Details Name: [name] MRN: [id number] DOB: 08-04-1950   Cancelled Treatment:    Reason Eval/Treat Not Completed: Patient not medically ready.  Pt intubated and on bedrest.  Will initiate OT once medically stable.   Loris Seelye Frederick, OTR/L [phone]   Lucille Passy M 10/16/2016, 9:59 AM

## 2016-10-16 NOTE — Anesthesia Postprocedure Evaluation (Signed)
Anesthesia Post Note  Patient: Ralph Lopez  Procedure(s) Performed: Procedure(s) (LRB): RIGHT CAROTID-SUBCLAVIAN THROMBECTOMY (Right)  Patient location during evaluation: ICU Anesthesia Type: General Level of consciousness: patient remains intubated per anesthesia plan Pain management: pain level controlled Respiratory status: patient remains intubated per anesthesia plan Cardiovascular status: stable Anesthetic complications: no       Last Vitals:  Vitals:   10/16/16 1245 10/16/16 1300  BP: (!) 152/74 139/69  Pulse: 88 94  Resp: 20 (!) 23  Temp:      Last Pain:  Vitals:   10/16/16 1200  TempSrc: Oral  PainSc:                  Jlee Harkless

## 2016-10-16 NOTE — Progress Notes (Signed)
PT Cancellation Note  Patient Details Name: CYPHER PAULE MRN: [id number] DOB: 1950-09-13   Cancelled Treatment:    Reason Eval/Treat Not Completed: Patient not medically ready. Pt on bedrest; will follow-up with PT eval when medically appropriate as time permits.  Enis Gash, SPT [phone]  Mabeline Caras 10/16/2016, 8:21 AM

## 2016-10-16 NOTE — Progress Notes (Signed)
PULMONARY / CRITICAL CARE MEDICINE   Name: Ralph Lopez MRN: [id number] DOB: December 26, 1950    ADMISSION DATE:  10/15/2016 CONSULTATION DATE:  10/15/2016  REFERRING MD:  Dr. Leonel Ramsay   CHIEF COMPLAINT:  Left MCA S/P revascularization   Brief:   66 year old male with PMH of DM, HTN, HLD, CKD, COPD, Stage 4 Lung CA, and Depression. Presents to ED on 3/28 as a code stroke. Wife reports seeing him at 0300 in his normal state when he got up to go to the bathroom. Later in the morning wife found him on the ground of the kitchen aphasic. 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 Upon arrival to the ED CT revealed a large left MCA. Patient was intubated and then underwent a left common carotid arteriogram followed followed by complete revascularization. PCCM asked to consult for vent management.   Of note patient is being treated with carboplatin and Taxol. Has had radiation therapy with a planned PET soon. However, he has been complaining of hip pain in which he was told was due to a pelvic mass.   SUBJECTIVE:  On low dose propofol. On vent currently weaning.   VITAL SIGNS: BP (!) 155/82   Pulse 79   Temp 98.1 F (36.7 C) (Oral)   Resp 16   SpO2 100%   HEMODYNAMICS:    VENTILATOR SETTINGS: Vent Mode: PRVC FiO2 (%):  [40 %-50 %] 40 % Set Rate:  [14 bmp] 14 bmp Vt Set:  [500 mL] 500 mL PEEP:  [5 cmH20] 5 cmH20 Plateau Pressure:  [15 cmH20-19 cmH20] 16 cmH20  INTAKE / OUTPUT: I/O last 3 completed shifts: In: 3488.6 [I.V.:3488.6] Out: 2161 [Urine:2111; Blood:50]  PHYSICAL EXAMINATION: General:  Adult male, no distress  Neuro:  Awake, follows commands, moves extremities, 4/5 on right side, 5/5 left side   HEENT:  ETT in place  Cardiovascular:  RRR, no MRG, NI S1/S2 Lungs:  Clear breath sounds, non-labored  Abdomen:  Non-tender, non-distended  Musculoskeletal:  No acute  Skin:  Warm, dry, intact   LABS:  BMET  Recent Labs Lab 10/15/16 1143  10/15/16 1147 10/16/16 0557  NA 138 139 140  K 3.6 3.6 3.6  CL 102 101 108  CO2 25  --  22  BUN '15 17 14  '$ CREATININE 1.59* 1.50* 1.08  GLUCOSE 188* 190* 129*    Electrolytes  Recent Labs Lab 10/15/16 1143 10/16/16 0557  CALCIUM 10.4* 10.0    CBC  Recent Labs Lab 10/15/16 1143 10/15/16 1147 10/16/16 0557  WBC 11.9*  --  11.9*  HGB 10.3* 10.5* 8.4*  HCT 32.4* 31.0* 26.3*  PLT 253  --  294    Coag's  Recent Labs Lab 10/15/16 1143  APTT 22*  INR 1.09    Sepsis Markers No results for input(s): LATICACIDVEN, PROCALCITON, O2SATVEN in the last 168 hours.  ABG  Recent Labs Lab 10/15/16 1730  PHART 7.424  PCO2ART 37.3  PO2ART 142*    Liver Enzymes  Recent Labs Lab 10/15/16 1143  AST 18  ALT 8*  ALKPHOS 53  BILITOT 0.5  ALBUMIN 3.0*    Cardiac Enzymes No results for input(s): TROPONINI, PROBNP in the last 168 hours.  Glucose  Recent Labs Lab 10/15/16 1438 10/15/16 1637 10/15/16 2042 10/15/16 2341 10/16/16 0509 10/16/16 0818  GLUCAP 117* 127* 151* 128* 124* 117*    Imaging Ct Angio Head W Or Wo Contrast  Result Date: 10/15/2016 CLINICAL DATA:  Acute onset right-sided weakness  and abnormal speech. EXAM: CT ANGIOGRAPHY HEAD AND NECK TECHNIQUE: Multidetector CT imaging of the head and neck was performed using the standard protocol during bolus administration of intravenous contrast. Multiplanar CT image reconstructions and MIPs were obtained to evaluate the vascular anatomy. Carotid stenosis measurements (when applicable) are obtained utilizing NASCET criteria, using the distal internal carotid diameter as the denominator. CONTRAST:  90 mL Isovue 370 COMPARISON:  MRI brain 05/14/2016 FINDINGS: CTA NECK FINDINGS Aortic arch: Atherosclerotic calcifications are present at the aortic arch without compromise of the great vessel origins. There is no aneurysm. Right carotid system: The a saddle embolus is present at bifurcation of the innominate artery  and to the right common carotid artery and subclavian artery. This is nonocclusive. There is some nonocclusive thrombus extending more superiorly within the right common carotid artery is well. Atherosclerotic changes are present at the right carotid bifurcation without significant stenosis. Left carotid system: The left common carotid artery is within normal limits. Atherosclerotic changes are present at left carotid bifurcation. There is calcified plaque proximally. Noncalcified posterior plaque more distally narrows the with 3 mm. This compares with a distal measurement of 4.5 mm. Posterior calcification just below the skullbase may reflect a remote injury. There is no associated stenosis. Vertebral arteries: The vertebral arteries both originate from the subclavian arteries. There is no focal stenosis of the origin. The left vertebral artery the dominant vessel. There is no significant stenosis cannot air vertebral artery in the. Dense atherosclerotic calcifications narrowing of the left vertebral artery limiting to 1.5 mm. Skeleton: Vertebral body heights and alignment are normal. There is no focal lytic or blastic lesion. The patient is edentulous. Other neck: No significant cervical adenopathy is present. No focal mucosal or submucosal lesions are present. The submandibular and parotid glands are within normal limits bilaterally. Upper chest: A medial right upper lobe chest mass decreased in size from the previous PET scan. The lesion measures 6.0 x 7.3 cm. There is postobstructive pneumonitis in the right upper lobe. A right pleural effusion is present. Ill-defined opacity in the superior segment of the left lower lobe is incompletely imaged. Review of the MIP images confirms the above findings CTA HEAD FINDINGS Anterior circulation: Atherosclerotic calcifications are present in precavernous cavernous internal carotid arteries bilaterally without a significant stenosis relative to the more distal vessel. The  terminus is intact bilaterally. The A1 segments are normal. The right M1 segment is normal. A nonocclusive thrombus is present in the distal left M1 segment at the MCA bifurcation. MCA branch vessels opacify the. They are of decreased caliber compared to the MCA branch vessels on the right. The area some atherosclerotic irregularity a MCA branch vessels. No focal occlusion is evident. Increased collaterals are evident on the left. Posterior circulation: Moderate stenosis is present in the dominant left vertebral artery at the dural margin. PICA origins are visualized and normal. The basilar artery is normal. Both posterior cerebral arteries originate from basilar tip. There is mild irregularity without focal stenosis or occlusion. Venous sinuses: Dural sinuses are patent. The left transverse sinus dominant. Anatomic variants: None Delayed phase: 2 punctate areas of enhancement in the right frontal lobe from previous MRI are not seen on today's study. No areas of enhancement are evident on the source images. Review of the MIP images confirms the above findings IMPRESSION: 1. Thrombus in the distal left M1 segment to the bifurcation. Flow is present in reduced caliber left MCA branch vessels. The thrombus is likely nonocclusive. Collateral vessels are also  present. 2. Saddle embolus at the innominate artery bifurcation into the right common carotid artery and right subclavian artery. This embolus is nonocclusive. 3. Atherosclerotic changes at the carotid bifurcations and cavernous internal carotid arteries bilaterally without significant stenosis. 4. Atherosclerotic calcifications with moderate stenosis in the proximal left vertebral artery. These results were called by telephone at the time of interpretation on 10/15/2016 at 12:30 pm to Dr. Leonel Ramsay , who verbally acknowledged these results. Electronically Signed   By: San Morelle M.D.   On: 10/15/2016 13:05   Ct Head Wo Contrast  Result Date:  10/15/2016 CLINICAL DATA:  Stroke.  Post endovascular revascularization EXAM: CT HEAD WITHOUT CONTRAST TECHNIQUE: Contiguous axial images were obtained from the base of the skull through the vertex without intravenous contrast. COMPARISON:  CT head 10/07/2016 FINDINGS: Brain: Improvement in hypodensity in the left MCA territory compared with the earlier scan. There remains hypodensity in the left inferior temporal lobe extending into the insular cortex compatible with infarction. There is edema effacing the sylvian fissure on the left. Low-density in the frontal operculum has significantly improved as has the edema in the insular cortex. Negative for acute hemorrhage. Ventricle size normal. 2 mm midline shift to the right due to edema. Vascular: Normal arterial and venous enhancement due to prior cerebral angiogram. Skull: Negative Sinuses/Orbits: Mild mucosal edema paranasal sinuses. Other: None IMPRESSION: Improvement in left MCA hypodensity following clot retrieval. There remains low-density in the left inferior temporal lobe and insular cortex with edema which has improved since earlier today. No acute hemorrhage.  Mild midline shift to the right. Electronically Signed   By: Franchot Gallo M.D.   On: 10/15/2016 14:50   Ct Angio Neck W Or Wo Contrast  Result Date: 10/15/2016 CLINICAL DATA:  Acute onset right-sided weakness and abnormal speech. EXAM: CT ANGIOGRAPHY HEAD AND NECK TECHNIQUE: Multidetector CT imaging of the head and neck was performed using the standard protocol during bolus administration of intravenous contrast. Multiplanar CT image reconstructions and MIPs were obtained to evaluate the vascular anatomy. Carotid stenosis measurements (when applicable) are obtained utilizing NASCET criteria, using the distal internal carotid diameter as the denominator. CONTRAST:  90 mL Isovue 370 COMPARISON:  MRI brain 05/14/2016 FINDINGS: CTA NECK FINDINGS Aortic arch: Atherosclerotic calcifications are  present at the aortic arch without compromise of the great vessel origins. There is no aneurysm. Right carotid system: The a saddle embolus is present at bifurcation of the innominate artery and to the right common carotid artery and subclavian artery. This is nonocclusive. There is some nonocclusive thrombus extending more superiorly within the right common carotid artery is well. Atherosclerotic changes are present at the right carotid bifurcation without significant stenosis. Left carotid system: The left common carotid artery is within normal limits. Atherosclerotic changes are present at left carotid bifurcation. There is calcified plaque proximally. Noncalcified posterior plaque more distally narrows the with 3 mm. This compares with a distal measurement of 4.5 mm. Posterior calcification just below the skullbase may reflect a remote injury. There is no associated stenosis. Vertebral arteries: The vertebral arteries both originate from the subclavian arteries. There is no focal stenosis of the origin. The left vertebral artery the dominant vessel. There is no significant stenosis cannot air vertebral artery in the. Dense atherosclerotic calcifications narrowing of the left vertebral artery limiting to 1.5 mm. Skeleton: Vertebral body heights and alignment are normal. There is no focal lytic or blastic lesion. The patient is edentulous. Other neck: No significant cervical adenopathy is present. No  focal mucosal or submucosal lesions are present. The submandibular and parotid glands are within normal limits bilaterally. Upper chest: A medial right upper lobe chest mass decreased in size from the previous PET scan. The lesion measures 6.0 x 7.3 cm. There is postobstructive pneumonitis in the right upper lobe. A right pleural effusion is present. Ill-defined opacity in the superior segment of the left lower lobe is incompletely imaged. Review of the MIP images confirms the above findings CTA HEAD FINDINGS Anterior  circulation: Atherosclerotic calcifications are present in precavernous cavernous internal carotid arteries bilaterally without a significant stenosis relative to the more distal vessel. The terminus is intact bilaterally. The A1 segments are normal. The right M1 segment is normal. A nonocclusive thrombus is present in the distal left M1 segment at the MCA bifurcation. MCA branch vessels opacify the. They are of decreased caliber compared to the MCA branch vessels on the right. The area some atherosclerotic irregularity a MCA branch vessels. No focal occlusion is evident. Increased collaterals are evident on the left. Posterior circulation: Moderate stenosis is present in the dominant left vertebral artery at the dural margin. PICA origins are visualized and normal. The basilar artery is normal. Both posterior cerebral arteries originate from basilar tip. There is mild irregularity without focal stenosis or occlusion. Venous sinuses: Dural sinuses are patent. The left transverse sinus dominant. Anatomic variants: None Delayed phase: 2 punctate areas of enhancement in the right frontal lobe from previous MRI are not seen on today's study. No areas of enhancement are evident on the source images. Review of the MIP images confirms the above findings IMPRESSION: 1. Thrombus in the distal left M1 segment to the bifurcation. Flow is present in reduced caliber left MCA branch vessels. The thrombus is likely nonocclusive. Collateral vessels are also present. 2. Saddle embolus at the innominate artery bifurcation into the right common carotid artery and right subclavian artery. This embolus is nonocclusive. 3. Atherosclerotic changes at the carotid bifurcations and cavernous internal carotid arteries bilaterally without significant stenosis. 4. Atherosclerotic calcifications with moderate stenosis in the proximal left vertebral artery. These results were called by telephone at the time of interpretation on 10/15/2016 at 12:30  pm to Dr. Leonel Ramsay , who verbally acknowledged these results. Electronically Signed   By: San Morelle M.D.   On: 10/15/2016 13:05   Mr Brain Wo Contrast  Result Date: 10/16/2016 CLINICAL DATA:  66 y/o M; left MCA distribution infarct post revascularization. EXAM: MRI HEAD WITHOUT CONTRAST TECHNIQUE: Multiplanar, multiecho pulse sequences of the brain and surrounding structures were obtained without intravenous contrast. COMPARISON:  10/15/2016 CT angiogram of the head. FINDINGS: Brain: Diffusion restriction is present within the left insula, left anterior temporal lobe, and left lateral frontal lobe in distribution similar to CT perfusion CBF less than 30%. There is an additional area of diffusion restriction within the left parietal lobe not indicated on the perfusion study. Areas of infarction demonstrate T2 FLAIR hyperintense signal abnormality and mild local mass effect. Motion degraded sagittal T1 weighted sequence. Small chronic infarction is present within the right parietal cortex. There are mild chronic microvascular ischemic changes of white matter and mild brain parenchymal volume loss. No abnormal susceptibility hypointensity is present to indicate intracranial hemorrhage. No hydrocephalus or extra-axial collection. Vascular: Normal flow voids. Skull and upper cervical spine: Normal marrow signal. Sinuses/Orbits: Negative. Other: None. IMPRESSION: Left MCA distribution diffusion restriction is present in the left insula, left anterior temporal lobe, and left lateral frontal lobe in distribution similar to CT perfusion  CBF less than 30%. There is a small additional area of diffusion restriction within the left parietal lobe not indicated on the perfusion study. Findings are compatible with acute infarction. No associated hemorrhage identified. Electronically Signed   By: Kristine Garbe M.D.   On: 10/16/2016 04:49   Ir Angiogram Extremity Right  Result Date:  10/16/2016 INDICATION: Global aphasia. Right-sided weakness. CT perfusion examination revealed mismatch volume of 131 mL with the CBF < 30% volume of 43 mL, and a mismatch ratio of 4.0. Large near complete occlusive filling defect in the distal right middle cerebral artery extending into the bifurcation middle cerebral artery just proximal to the bifurcation and extending into the bifurcation. EXAM: 1. EMERGENT LARGE VESSEL OCCLUSION THROMBOLYSIS (anterior CIRCULATION) COMPARISON:  CT angiogram of 10/15/2016. MEDICATIONS: Ancef 2 g IV. The antibiotic was administered within 1 hour of the procedure. ANESTHESIA/SEDATION: General anesthesia. CONTRAST:  Isovue 300 approximately 60 mL. FLUOROSCOPY TIME:  Fluoroscopy Time: 10 minutes 12 seconds (837 mGy). COMPLICATIONS: None immediate. TECHNIQUE: Following a full explanation of the procedure along with the potential associated complications, an informed witnessed consent was obtained. The risks of intracranial hemorrhage of 10%, worsening neurological deficit, ventilator dependency, death and inability to revascularize were all reviewed in detail with the patient's wife. The patient was then put under general anesthesia by the Department of Anesthesiology at [facility]. The right groin was prepped and draped in the usual sterile fashion. Thereafter using modified Seldinger technique, transfemoral access into the right common femoral artery was obtained without difficulty. Over a 0.035 inch guidewire a 5 French Pinnacle sheath was inserted. Through this, and also over a 0.035 inch guidewire a 5 [place] JB 1 catheter was advanced to the aortic arch region and selectively positioned in the innominate artery and the left common carotid artery. FINDINGS: The innominate arteriogram demonstrates a long segment lobulated filling defect which extends from the mid innominate artery across the origin of the right common carotid artery and the right vertebral artery. No  angiographic flow is noted in the right vertebral artery. However, there is flow noted in the right common carotid artery. The right common carotid artery on the lateral projection demonstrates wide patency at the right carotid artery bifurcation to the supraclinoid segment with opacification of the visualized right MCA distribution on the lateral projection. The left common carotid arteriogram demonstrates the origin of the left external carotid artery to be patent. The opacified portions of the left external carotid artery appear patent. The left internal carotid artery at the bulb to the cranial skull base demonstrates wide patency, with a small shelf-like plaque noted along the posterior wall of the left internal carotid artery at the distal aspect of the bulb. The left internal carotid artery is seen to opacify to the cranial skull base. The petrous segment is widely patent. There is a focal stenoses of approximately 30% of the caval cavernous segment of the left internal carotid artery. Distal to this, the distal cavernous and the supraclinoid segments are widely patent. The left middle cerebral artery in its M1 segment demonstrates patency. There is attenuated caliber in the distal left M1 segment with near complete occlusion extending into the inferior division with near complete occlusion of the superior division. Multiple filling defects are seen in this region. The left anterior cerebral artery is seen to opacify normally into the capillary and venous phases. The delayed arterial phase demonstrates partial retrograde opacification of the perisylvian branches from the pericallosal and callosal marginal branches. Also  noted is prompt opacification via the anterior communicating artery of the right anterior cerebral A2 segment and the right anterior cerebral A1 segment. Opacification of the right middle cerebral artery M1 segment and distally is also noted from the left common carotid artery injection.  PROCEDURE: The diagnostic JB 1 catheter in the left common carotid artery was then exchanged over a 0.035 inch 300 cm Rosen exchange guidewire for a 55 cm 8 French Brite tip neurovascular sheath using biplane roadmap technique and constant fluoroscopic guidance. Good aspiration was obtained from the hub of the 8 French neurovascular sheath. This was then connected to continuous heparinized saline infusion. Over the [employer] guidewire, a 95 cm 8 [place] FlowGate balloon guide catheter which been prepped with 50% contrast and 50% heparinized saline infusion was then advanced and positioned in the left common carotid artery. The guidewire was removed. Good aspiration was obtained from the hub of the 8 [place] FlowGate guide catheter. A gentle constant injection demonstrated no evidence of spasms, dissections or of intraluminal filling defects. Over a 0.035 inch Roadrunner guidewire, using biplane roadmap technique and constant fluoroscopic guidance, the 8 [place] FlowGate guide catheter was then advanced to the cervical petrous junction of the left internal carotid artery. The guidewire was removed. Good aspiration was obtained from the hub of the 8 [place] FlowGate balloon guide catheter. A gentle contrast injection demonstrated no evidence spasms, dissections or of intraluminal filling defects. At this time, in a coaxial manner and with constant heparinized saline infusion using biplane roadmap technique and constant fluoroscopic guidance, a Trevo ProVue 021 microcatheter was advanced over a 0.014 inch Softip Synchro micro guidewire to the distal end of the 8 [place] FlowGate guide catheter. With the micro guidewire leading with a J-tip configuration, the combination was navigated without difficulty to the supraclinoid left ICA. A torque device was then utilized to advance the micro guidewire to the left middle cerebral artery followed by the microcatheter. The micro guidewire was then advanced without difficulty  through the inferior division of left middle cerebral artery into the M2 M3 region followed by the microcatheter. The micro guidewire was removed. Good aspiration was obtained from the hub of the microcatheter. Gentle contrast injection demonstrated a brisk antegrade flow distally. A 4 mm x 40 mm Solitaire FR retrieval device was then purged with 50% contrast and 50% heparinized saline infusion. Thereafter this was advanced again in a coaxial manner and with constant heparinized saline infusion using biplane roadmap technique and constant fluoroscopic guidance to the distal end of the microcatheter. The O ring on the delivery microcatheter was then loosened. With slight forward gentle traction with the right hand on the delivery micro guidewire with the left hand the delivery microcatheter was retrieved unsheathing the distal end and then the proximal portion of the retrieval device. The tip of the microcatheter was just proximal to the proximal portion of the retrieval device. A brisk control arteriogram performed through the 8 [place] FlowGate guide catheter in the left internal carotid artery demonstrated brisk flow through the inferior division and partially through the superior division though improved. A TICI 2b reperfusion was noted. The balloon in the distal left internal carotid artery FlowGate guide catheter was then inflated for proximal flow arrest. The proximal portion of the retrieval device was then captured into the microcatheter. There on after with constant aspiration being applied with a 60 mL syringe at the hub of the [facility] guide catheter, the combination of the retrieval device and the microcatheter were retrieved  and removed. Aspiration was continued as the balloon was deflated. Free back bleed was noted at the hub of 8 [place] FlowGate guide catheter. The aspirate contained 2 chunks of clots. Also noted in the [employer] was a another piece of mixed bloody and fibrosis clot. A control  arteriogram performed through the 8 [place] FlowGate guide catheter in the left internal carotid artery demonstrated complete angiographic revascularization of the occluded left middle cerebral artery distribution. No angiographic evidence of filling defects or occlusions or stenosis was seen. No evidence of extravasation, or mass-effect on the major vessel was noted. Moderate spasm was noted in the inferior division of the left middle cerebral artery which responded promptly to 2 aliquots of 25 mics of nitroglycerin given through the [facility] guide catheter. A final control arteriogram performed through the [facility] guide catheter in the left internal carotid artery demonstrated complete angiographic revascularization of the left MCA distribution. The left anterior cerebral artery appeared patent with brisk flow into the contralateral cerebral hemisphere as described previously. The patient's neurological status and hemodynamic status remained stable throughout the procedure. The 8 [place] FlowGate guide catheter and the 8 [place] Brite tip neurovascular sheath were retrieved in the abdominal aorta and exchanged over a J-tip guidewire for an 8 [place] Pinnacle sheath. This was then removed successfully with the application of an external closure device with compression at the puncture site for 20 minutes. The groin site appeared soft without evidence of a hematoma. The distal pulses remained stable palpable bilaterally and unchanged compared to prior to the procedure. IMPRESSION: Status post endovascular complete revascularization of left MCA occlusion with 1 pass with the Solitaire FR 4 mm x 40 mm retrieval device achieving a TICI 3 reperfusion. Groin puncture time 13:12. Revascularization with TICI 2b at 13:35. Revascularization with TICI 3 at 13:39. PLAN: Patient to CT scanner for postprocedural CT scan of brain. Electronically Signed   By: Luanne Bras M.D.   On: 10/15/2016 21:34   Ct Cerebral Perfusion W  Contrast  Result Date: 10/15/2016 CLINICAL DATA:  Acute onset of right sided weakness and abnormal speech. Lung cancer. EXAM: CT PERFUSION BRAIN TECHNIQUE: Multiphase CT imaging of the brain was performed following IV bolus contrast injection. Subsequent parametric perfusion maps were calculated using RAPID software. CONTRAST:  90 mL Isovue 370 COMPARISON:  MRI brain 05/14/2016. FINDINGS: CT Brain Perfusion Findings: CBF (<30%) Volume: 80m Perfusion (Tmax>6.0s) volume: 1748mMismatch Volume: 1314mnfarction Location:Left MCA territory. Arterial and venous and was are excellent. Minimal patient motion is evident. IMPRESSION: 1. The acute infarct involving the left MCA territory with estimated volume of CT a less than 30% at 43 mL. 2. Mismatch volume 131 mL. These results were called by telephone at the time of interpretation on 10/15/2016 at 12:30 pm to Dr. KIRLeonel Ramsaywho verbally acknowledged these results. Electronically Signed   By: ChrSan MorelleD.   On: 10/15/2016 12:39   Dg Chest Port 1 View  Result Date: 10/16/2016 CLINICAL DATA:  Ventilator dependent respiratory failure. History of CVA, COPD, lung malignancy, acute right rib fractures. EXAM: PORTABLE CHEST 1 VIEW COMPARISON:  Portable chest x-ray of October 15, 2016 FINDINGS: The endotracheal tube tip lies approximately 1.5 cm above the superior margin of the clavicular heads. The lungs are reasonably well inflated. A large right hilar mass is stable. Multiple pulmonary parenchymal masses in the mid and lower left lung also are stable. There is no pneumothorax or pleural effusion. Fractures of the lateral aspects of the right sixth  and seventh ribs are again observed. The heart and pulmonary vascularity are normal. IMPRESSION: High positioning of the endotracheal tube. Advancement by at least 5 cm would be useful. The remainder the findings in the chest are stable. Electronically Signed   By: Athony  Martinique M.D.   On: 10/16/2016 07:43   Dg  Chest Port 1 View  Result Date: 10/15/2016 CLINICAL DATA:  Status post intubation EXAM: PORTABLE CHEST 1 VIEW COMPARISON:  04/28/2016 FINDINGS: Cardiac shadow is within normal limits. Multiple pulmonary mass lesions are noted particularly in the region of the right hilum but scattered throughout both lungs. An endotracheal tube is noted in satisfactory position. No pneumothorax is noted. Multiple right rib fractures are seen. IMPRESSION: Multiple right rib fractures. Endotracheal tube in satisfactory position. Changes consistent with lung carcinoma and multiple scattered pulmonary lesions. These have progressed in the interval from the prior exam of 05/02/2016 Electronically Signed   By: Inez Catalina M.D.   On: 10/15/2016 16:18   Ir Percutaneous Art Thrombectomy/infusion Intracranial Inc Diag Angio  Result Date: 10/16/2016 INDICATION: Global aphasia. Right-sided weakness. CT perfusion examination revealed mismatch volume of 131 mL with the CBF < 30% volume of 43 mL, and a mismatch ratio of 4.0. Large near complete occlusive filling defect in the distal right middle cerebral artery extending into the bifurcation middle cerebral artery just proximal to the bifurcation and extending into the bifurcation. EXAM: 1. EMERGENT LARGE VESSEL OCCLUSION THROMBOLYSIS (anterior CIRCULATION) COMPARISON:  CT angiogram of 10/15/2016. MEDICATIONS: Ancef 2 g IV. The antibiotic was administered within 1 hour of the procedure. ANESTHESIA/SEDATION: General anesthesia. CONTRAST:  Isovue 300 approximately 60 mL. FLUOROSCOPY TIME:  Fluoroscopy Time: 10 minutes 12 seconds (837 mGy). COMPLICATIONS: None immediate. TECHNIQUE: Following a full explanation of the procedure along with the potential associated complications, an informed witnessed consent was obtained. The risks of intracranial hemorrhage of 10%, worsening neurological deficit, ventilator dependency, death and inability to revascularize were all reviewed in detail with the  patient's wife. The patient was then put under general anesthesia by the Department of Anesthesiology at [facility]. The right groin was prepped and draped in the usual sterile fashion. Thereafter using modified Seldinger technique, transfemoral access into the right common femoral artery was obtained without difficulty. Over a 0.035 inch guidewire a 5 French Pinnacle sheath was inserted. Through this, and also over a 0.035 inch guidewire a 5 [place] JB 1 catheter was advanced to the aortic arch region and selectively positioned in the innominate artery and the left common carotid artery. FINDINGS: The innominate arteriogram demonstrates a long segment lobulated filling defect which extends from the mid innominate artery across the origin of the right common carotid artery and the right vertebral artery. No angiographic flow is noted in the right vertebral artery. However, there is flow noted in the right common carotid artery. The right common carotid artery on the lateral projection demonstrates wide patency at the right carotid artery bifurcation to the supraclinoid segment with opacification of the visualized right MCA distribution on the lateral projection. The left common carotid arteriogram demonstrates the origin of the left external carotid artery to be patent. The opacified portions of the left external carotid artery appear patent. The left internal carotid artery at the bulb to the cranial skull base demonstrates wide patency, with a small shelf-like plaque noted along the posterior wall of the left internal carotid artery at the distal aspect of the bulb. The left internal carotid artery is seen to opacify to the  cranial skull base. The petrous segment is widely patent. There is a focal stenoses of approximately 30% of the caval cavernous segment of the left internal carotid artery. Distal to this, the distal cavernous and the supraclinoid segments are widely patent. The left middle cerebral  artery in its M1 segment demonstrates patency. There is attenuated caliber in the distal left M1 segment with near complete occlusion extending into the inferior division with near complete occlusion of the superior division. Multiple filling defects are seen in this region. The left anterior cerebral artery is seen to opacify normally into the capillary and venous phases. The delayed arterial phase demonstrates partial retrograde opacification of the perisylvian branches from the pericallosal and callosal marginal branches. Also noted is prompt opacification via the anterior communicating artery of the right anterior cerebral A2 segment and the right anterior cerebral A1 segment. Opacification of the right middle cerebral artery M1 segment and distally is also noted from the left common carotid artery injection. PROCEDURE: The diagnostic JB 1 catheter in the left common carotid artery was then exchanged over a 0.035 inch 300 cm Rosen exchange guidewire for a 55 cm 8 French Brite tip neurovascular sheath using biplane roadmap technique and constant fluoroscopic guidance. Good aspiration was obtained from the hub of the 8 French neurovascular sheath. This was then connected to continuous heparinized saline infusion. Over the [employer] guidewire, a 95 cm 8 [place] FlowGate balloon guide catheter which been prepped with 50% contrast and 50% heparinized saline infusion was then advanced and positioned in the left common carotid artery. The guidewire was removed. Good aspiration was obtained from the hub of the 8 [place] FlowGate guide catheter. A gentle constant injection demonstrated no evidence of spasms, dissections or of intraluminal filling defects. Over a 0.035 inch Roadrunner guidewire, using biplane roadmap technique and constant fluoroscopic guidance, the 8 [place] FlowGate guide catheter was then advanced to the cervical petrous junction of the left internal carotid artery. The guidewire was removed. Good  aspiration was obtained from the hub of the 8 [place] FlowGate balloon guide catheter. A gentle contrast injection demonstrated no evidence spasms, dissections or of intraluminal filling defects. At this time, in a coaxial manner and with constant heparinized saline infusion using biplane roadmap technique and constant fluoroscopic guidance, a Trevo ProVue 021 microcatheter was advanced over a 0.014 inch Softip Synchro micro guidewire to the distal end of the 8 [place] FlowGate guide catheter. With the micro guidewire leading with a J-tip configuration, the combination was navigated without difficulty to the supraclinoid left ICA. A torque device was then utilized to advance the micro guidewire to the left middle cerebral artery followed by the microcatheter. The micro guidewire was then advanced without difficulty through the inferior division of left middle cerebral artery into the M2 M3 region followed by the microcatheter. The micro guidewire was removed. Good aspiration was obtained from the hub of the microcatheter. Gentle contrast injection demonstrated a brisk antegrade flow distally. A 4 mm x 40 mm Solitaire FR retrieval device was then purged with 50% contrast and 50% heparinized saline infusion. Thereafter this was advanced again in a coaxial manner and with constant heparinized saline infusion using biplane roadmap technique and constant fluoroscopic guidance to the distal end of the microcatheter. The O ring on the delivery microcatheter was then loosened. With slight forward gentle traction with the right hand on the delivery micro guidewire with the left hand the delivery microcatheter was retrieved unsheathing the distal end and then the proximal portion  of the retrieval device. The tip of the microcatheter was just proximal to the proximal portion of the retrieval device. A brisk control arteriogram performed through the 8 [place] FlowGate guide catheter in the left internal carotid artery demonstrated  brisk flow through the inferior division and partially through the superior division though improved. A TICI 2b reperfusion was noted. The balloon in the distal left internal carotid artery FlowGate guide catheter was then inflated for proximal flow arrest. The proximal portion of the retrieval device was then captured into the microcatheter. There on after with constant aspiration being applied with a 60 mL syringe at the hub of the [facility] guide catheter, the combination of the retrieval device and the microcatheter were retrieved and removed. Aspiration was continued as the balloon was deflated. Free back bleed was noted at the hub of 8 [place] FlowGate guide catheter. The aspirate contained 2 chunks of clots. Also noted in the [employer] was a another piece of mixed bloody and fibrosis clot. A control arteriogram performed through the 8 [place] FlowGate guide catheter in the left internal carotid artery demonstrated complete angiographic revascularization of the occluded left middle cerebral artery distribution. No angiographic evidence of filling defects or occlusions or stenosis was seen. No evidence of extravasation, or mass-effect on the major vessel was noted. Moderate spasm was noted in the inferior division of the left middle cerebral artery which responded promptly to 2 aliquots of 25 mics of nitroglycerin given through the [facility] guide catheter. A final control arteriogram performed through the [facility] guide catheter in the left internal carotid artery demonstrated complete angiographic revascularization of the left MCA distribution. The left anterior cerebral artery appeared patent with brisk flow into the contralateral cerebral hemisphere as described previously. The patient's neurological status and hemodynamic status remained stable throughout the procedure. The 8 [place] FlowGate guide catheter and the 8 [place] Brite tip neurovascular sheath were retrieved in the abdominal aorta and exchanged  over a J-tip guidewire for an 8 [place] Pinnacle sheath. This was then removed successfully with the application of an external closure device with compression at the puncture site for 20 minutes. The groin site appeared soft without evidence of a hematoma. The distal pulses remained stable palpable bilaterally and unchanged compared to prior to the procedure. IMPRESSION: Status post endovascular complete revascularization of left MCA occlusion with 1 pass with the Solitaire FR 4 mm x 40 mm retrieval device achieving a TICI 3 reperfusion. Groin puncture time 13:12. Revascularization with TICI 2b at 13:35. Revascularization with TICI 3 at 13:39. PLAN: Patient to CT scanner for postprocedural CT scan of brain. Electronically Signed   By: Luanne Bras M.D.   On: 10/15/2016 21:34   Ct Head Code Stroke W/o Cm  Result Date: 10/15/2016 CLINICAL DATA:  Code stroke. Acute onset of right-sided weakness. Difficulty with speech. On lung cancer. EXAM: CT HEAD WITHOUT CONTRAST TECHNIQUE: Contiguous axial images were obtained from the base of the skull through the vertex without intravenous contrast. COMPARISON:  None. FINDINGS: Brain: Areas of hypoattenuation involve the left insular cortex. Additional hypoattenuation is present in the left frontal operculum and the super ganglionic left frontal lobe. Decreased attenuation is present within the left temporal tip. There is no hemorrhage or mass lesion. Ventricles are normal size. No other acute infarct present. Vascular: Atherosclerotic calcifications are present at the cavernous internal carotid arteries and left greater than right vertebral arteries without a hyperdense vessel. Skull: No focal lytic or blastic lesions are present. Sinuses/Orbits: Paranasal sinuses and  mastoid air cells are clear. ASPECTS [facility] Stroke Program Early CT Score) - Ganglionic level infarction (caudate, lentiform nuclei, internal capsule, insula, M1-M3 cortex): 5/7 - Supraganglionic  infarction (M4-M6 cortex): 2/3 Total score (0-10 with 10 being normal): 7/10 IMPRESSION: 1. Acute nonhemorrhagic infarct involving the left MCA territory with involvement of the left insular cortex, left frontal operculum, left temporal tip, and left super ganglionic frontal lobe. 2. ASPECTS is 7/10 These results were called by telephone at the time of interpretation on 10/15/2016 at 12:30 pm to Dr. Leonel Ramsay , who verbally acknowledged these results. Electronically Signed   By: San Morelle M.D.   On: 10/15/2016 12:33    STUDIES:  CTA Head/Neck 3/28 > Thrombus in the distal left M1 Segment to the bifurcation, flow is present in reduced caliber left MCA branch vessels. Thrombus is nonocclusive. Saddle embolus at the innominate artery bifurcation into the right common carotid artery and right subclavian artery.  CT Head 3/28 > Improvement of left MCA following clot retrieval. Remains low -density in the left inferior temporal lobe and insular cortex with edema which has improvement since earlier  MRI Head 3/29 > Left MCA distribution diffusion restriction is present in the left insula, left anterior temporal lobe, and left lateral frontal lobe in distribution similar to CT perfusion CBF less than 30%, there is a small additional area of diffusion restriction within the left parietal lobe.   CULTURES: None.   ANTIBIOTICS: Ancef 3/28 > 3/28  SIGNIFICANT EVENTS: 3/28 > Presents code stroke with Left MCA > Revascularization   LINES/TUBES: ETT 3/28 >>   DISCUSSION: 66 year old male with Stage 4 lung CA presents to ED as code stroke. Imaging revealed Left MCA, patient underwent revascularization and post-op was transferred to ICU intubated.  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 H/O HTN, HLD P:  Cardiac Monitoring  ECHO pending  Wean Cleviprex for Systolic Goal [phone] PRN Labetalol  Continue Lipitor and ASA D/C Aline   RENAL A:   CKD (Bas Crt 1.4-1.7) P:   Trend BMP Replace electrolytes as needed Avoid Nephrotoxic medications   GASTROINTESTINAL A:   Dysphagia  GERD P:   Speech Evaluation post-extubation  PPI  HEMATOLOGIC A:   Stage 4 Lung CA with Mets  P:  Trend CBC SCDS  INFECTIOUS A:   No issues  P:   Trend WBC and fever curve   ENDOCRINE A:   DM   P:   SSI Q4H glucose checks   NEUROLOGIC A:   Left MCA s/p revascularization  H/O Depression  P:   RASS goal: 0 Per Neurology  Wean Propofol to achieve RASS    FAMILY  - Updates: Family updated at bedside   - Inter-disciplinary family meet or Palliative Care meeting due by: 10/22/2016  CC Time: 35 minutes  Pulmonary and [place] Pager: [phone]  10/16/2016, 8:51 AM

## 2016-10-16 NOTE — Progress Notes (Signed)
SLP Cancellation Note  Patient Details Name: Ralph Lopez MRN: [id number] DOB: February 01, 1951   Cancelled treatment:       Reason Eval/Treat Not Completed: Patient not medically ready. Pt is intubated this morning but weaning per notes. Will f/u as able.   Germain Osgood 10/16/2016, 10:13 AM  Germain Osgood, M.A. CCC-SLP [phone]

## 2016-10-16 NOTE — Progress Notes (Signed)
Pt placed on SBT 5/5 and tolerating well at this time.  RT will continue to monitor.

## 2016-10-16 NOTE — Consult Note (Signed)
Hospital Consult    Reason for Consult:  Saddle embolus to right CCA and right SCA Referring Physician:  Dr. Quay Burow MRN #:  [id number]  History of Present Illness: This is a 66 y.o. male with PMH significant for DM, HTN, hyperlipidemia, CKD (baseline creatinine is 1.4-1.5--today 1.08), COPD, depression and stage 4 lung CA.  He is being treated with carboplatin and Taxo and has had radiation tx.  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    He presented to the hospital yesterday with stroke.  He was aphasic and on the ground when his wife found him.  When EMS arrived, he was completely hemiplegic right side but this improved en route to the hospital.  On arrival, he did have right sided weakness and facial droop and was densely aphasic.    Family at bedside and say he is awake and following commands.  He has no hx of atrial fibrillation.   Radiology was consulted and he underwent arteriogram that revealed a prominent filling defect extending across the origins of RT CCA and RT VA.  Suspect large thrombus with possible underlying stenosis.  Instructions   given to [name] ,stroke  coordinater  to avoid BP cuff readings  Or venous sticks for now..  S/P Lt common carotid arteriogram,followed by complete revascularization  Pf occluded LT MCA prox with x 1 passwith Solitaire 63m x 40 mm retrieval device achieving aTICI 3 reperfusion  He is on insulin for diabetes.  He is on a CCB, BB and ACEI for blood pressure management.  He takes a daily aspirin.  Past Medical History:  Diagnosis Date  . Anxiety   . [place]    Stage IV Lung  . Chronic kidney disease   . COPD (chronic obstructive [place] disease) [place]   . Depression   . Diabetes mellitus without complication ([place]   . Hyperlipidemia   . Hypertension     Past Surgical History:  Procedure Laterality Date  . EYE SURGERY    . FLEXIBLE BRONCHOSCOPY N/A 04/28/2016   Procedure: FLEXIBLE BRONCHOSCOPY;  Surgeon: DWilhelmina Mcardle MD;   Location: [facility] ORS;  Service: Pulmonary;  Laterality: N/A;  . IR GENERIC HISTORICAL  10/15/2016   IR PERCUTANEOUS ART THROMBECTOMY/INFUSION INTRACRANIAL INC DIAG ANGIO 10/15/2016 SLuanne Bras MD [facility]  . IR GENERIC HISTORICAL  10/15/2016   IR ANGIOGRAM EXTREMITY RIGHT 10/15/2016 SLuanne Bras MD [facility]  . MANDIBLE SURGERY      No Known Allergies  Prior to Admission medications   Medication Sig Start Date End Date Taking? Authorizing Provider  albuterol (PROVENTIL HFA;VENTOLIN HFA) 108 (90 Base) MCG/ACT inhaler Inhale 2 puffs into the lungs every 6 (six) hours as needed for wheezing or shortness of breath.   Yes Historical Provider, MD  amLODipine (NORVASC) 5 MG tablet Take 1 tablet (5 mg total) by mouth daily. 06/27/16  Yes CKathrine Haddock NP  aspirin EC 81 MG tablet Take 81 mg by mouth daily.   Yes Historical Provider, MD  atenolol (TENORMIN) 100 MG tablet TAKE ONE TABLET BY MOUTH ONCE DAILY 09/12/16  Yes CKathrine Haddock NP  atorvastatin (LIPITOR) 10 MG tablet Take 10 mg by mouth daily.   Yes Historical Provider, MD  BYDUREON 2 MG PEN INJECT '2MG'$  SUBCUTANEOUSLY WEEKLY 07/11/16  Yes RVolney American PA-C  Insulin Glargine (TOUJEO SOLOSTAR) 300 UNIT/ML SOPN Inject 30 Units as directed daily. 06/02/16  Yes CKathrine Haddock NP  lisinopril (PRINIVIL,ZESTRIL) 40 MG tablet Take 40 mg by mouth daily. 05/30/16  Yes Historical Provider, MD  LORazepam (ATIVAN) 0.5 MG tablet Take 1 tablet (0.5 mg total) by mouth 2 (two) times daily. 09/08/16  Yes Kathrine Haddock, NP  ondansetron (ZOFRAN) 8 MG tablet Take 1 tablet by mouth 2 (two) times daily as needed. 05/09/16  Yes Historical Provider, MD  Oxycodone HCl 10 MG TABS Take 1 tablet (10 mg total) by mouth every 4 (four) hours as needed. 10/13/16  Yes Lloyd Huger, MD  pantoprazole (PROTONIX) 40 MG tablet Take 1 tablet (40 mg total) by mouth daily. 05/05/16  Yes Kathrine Haddock, NP  ranitidine (ZANTAC) 150 MG tablet Take 150 mg by mouth  daily as needed for heartburn.    Yes Historical Provider, MD  lisinopril (PRINIVIL,ZESTRIL) 40 MG tablet TAKE ONE TABLET BY MOUTH ONCE DAILY Patient not taking: Reported on 10/15/2016 09/12/16   Kathrine Haddock, NP  methylPREDNISolone (MEDROL DOSEPAK) 4 MG TBPK tablet As directed Patient not taking: Reported on 10/15/2016 09/08/16   Kathrine Haddock, NP  prochlorperazine (COMPAZINE) 10 MG tablet Take 1 tablet (10 mg total) by mouth every 6 (six) hours as needed (Nausea or vomiting). Patient not taking: Reported on 10/15/2016 05/15/16   Lloyd Huger, MD  umeclidinium-vilanterol [facility] ELLIPTA) 62.5-25 MCG/INH AEPB Inhale 1 puff into the lungs daily. Patient not taking: Reported on 09/04/2016 04/04/16   Kathrine Haddock, NP  zolpidem (AMBIEN) 5 MG tablet Take 1 tablet (5 mg total) by mouth at bedtime as needed for sleep. Patient not taking: Reported on 10/15/2016 06/05/16   Lloyd Huger, MD    Social History   Social History  . Marital status: Married    Spouse name: N/A  . Number of children: N/A  . Years of education: N/A   Occupational History  . Not on file.   Social History Main Topics  . Smoking status: Former Smoker    Years: 20.00    Quit date: 04/22/2014  . Smokeless tobacco: Never Used  . Alcohol use No  . Drug use: No  . Sexual activity: No   Other Topics Concern  . Not on file   Social History Narrative  . No narrative on file     Family History  Problem Relation Age of Onset  . Diabetes Mother   . Hypertension Mother   . Breast cancer Mother   . Thrombosis Mother   . Heart disease Father     MI  . Breast cancer Sister   . Colon cancer Brother   . Lung cancer Brother     ROS: '[x]'$  Positive   '[ ]'$  Negative   '[ ]'$  All sytems reviewed and are negative Obtained from chart and family as pt is intubated.  Cardiovascular: '[]'$  chest pain/pressure '[]'$  palpitations '[]'$  SOB lying flat '[]'$  DOE '[]'$  pain in legs while walking '[]'$  pain in legs at rest '[]'$  pain in legs at  night '[]'$  non-healing ulcers '[]'$  hx of DVT '[]'$  swelling in legs  Pulmonary: '[]'$  productive cough '[]'$  asthma/wheezing '[]'$  home O2  Neurologic: '[]'$  weakness in '[]'$  arms '[]'$  legs '[x]'$  numbness in '[x]'$  arm '[x]'$  leg (right) '[x]'$  hx of CVA '[]'$  mini stroke '[x]'$ difficulty speaking or slurred speech '[]'$  temporary loss of vision in one eye '[]'$  dizziness  Hematologic: '[x]'$  hx of cancer-stage 4 lung CA '[]'$  bleeding problems '[]'$  problems with blood clotting easily  Endocrine:   '[x]'$  diabetes '[]'$  thyroid disease  GI '[]'$  vomiting blood '[]'$  blood in stool '[x]'$  GERD  GU: '[x]'$  CKD/renal failure '[]'$  HD--'[]'$  M/W/F  or '[]'$  T/T/S '[]'$  burning with urination '[]'$  blood in urine  Psychiatric: '[]'$  anxiety '[]'$  depression  Musculoskeletal: '[]'$  arthritis '[]'$  joint pain  Integumentary: '[]'$  rashes '[]'$  ulcers  Constitutional: '[]'$  fever '[]'$  chills   Physical Examination  Vitals:   10/16/16 0845 10/16/16 0900  BP: (!) 146/82   Pulse: 80 81  Resp: 17 17  Temp:     There is no height or weight on file to calculate BMI.  General:  WDWN intubated Gait: Not observed HENT: WNL, normocephalic Pulmonary: intubated Cardiac: regular Skin: without rashes Vascular Exam/Pulses:  Right Left  Radial Diminished radial doppler signal 2+ (normal)  Ulnar +doppler signal present 2+ (normal)  Brachial  + doppler signal & palpable 2+ (normal)  DP Brisk doppler signal Brisk doppler signal  PT Brisk doppler signal Brisk doppler signal   Extremities: without ischemic changes, without Gangrene , without cellulitis; without open wounds;  Musculoskeletal: no muscle wasting or atrophy  Neurologic: pt is sedated on Propofol.  Per RN and family, he has woken up and moving all extremities; moves bilateral feet when stimulated but not on command Psychiatric:  Unable to determine as pt is intubated   CBC    Component Value Date/Time   WBC 11.9 (H) 10/16/2016 0557   RBC 2.97 (L) 10/16/2016 0557   HGB 8.4 (L) 10/16/2016 0557   HGB 12.8  (L) 04/25/2014 0407   HCT 26.3 (L) 10/16/2016 0557   HCT 33.6 (L) 07/07/2016 0950   PLT 294 10/16/2016 0557   PLT 329 07/07/2016 0950   MCV 88.6 10/16/2016 0557   MCV 86 07/07/2016 0950   MCV 96 04/25/2014 0407   MCH 28.3 10/16/2016 0557   MCHC 31.9 10/16/2016 0557   RDW 13.4 10/16/2016 0557   RDW 19.0 (H) 07/07/2016 0950   RDW 13.3 04/25/2014 0407   LYMPHSABS 0.4 (L) 10/16/2016 0557   LYMPHSABS 0.5 (L) 07/07/2016 0950   LYMPHSABS 0.6 (L) 04/25/2014 0407   MONOABS 0.4 10/16/2016 0557   MONOABS 0.6 04/25/2014 0407   EOSABS 0.0 10/16/2016 0557   EOSABS 0.0 04/25/2014 0407   BASOSABS 0.0 10/16/2016 0557   BASOSABS 0.0 04/25/2014 0407    BMET    Component Value Date/Time   NA 140 10/16/2016 0557   NA 141 07/07/2016 0950   NA 139 04/25/2014 0407   K 3.6 10/16/2016 0557   K 3.6 04/25/2014 0407   CL 108 10/16/2016 0557   CL 111 (H) 04/25/2014 0407   CO2 22 10/16/2016 0557   CO2 24 04/25/2014 0407   GLUCOSE 129 (H) 10/16/2016 0557   GLUCOSE 177 (H) 04/25/2014 0407   BUN 14 10/16/2016 0557   BUN 21 07/07/2016 0950   BUN 29 (H) 04/25/2014 0407   CREATININE 1.08 10/16/2016 0557   CREATININE 1.50 (H) 04/25/2014 0407   CALCIUM 10.0 10/16/2016 0557   CALCIUM 8.4 (L) 04/25/2014 0407   GFRNONAA >60 10/16/2016 0557   GFRNONAA 43 (L) 04/24/2014 0052   GFRAA >60 10/16/2016 0557   GFRAA 52 (L) 04/24/2014 0052    COAGS: Lab Results  Component Value Date   INR 1.09 10/15/2016     Non-Invasive Vascular Imaging:   CT head 10/15/16: IMPRESSION: Improvement in left MCA hypodensity following clot retrieval. There remains low-density in the left inferior temporal lobe and insular cortex with edema which has improved since earlier today.  No acute hemorrhage.  Mild midline shift to the right.  CTA neck 10/15/16: IMPRESSION: 1. Thrombus in the distal left M1  segment to the bifurcation. Flow is present in reduced caliber left MCA branch vessels. The thrombus is likely  nonocclusive. Collateral vessels are also present. 2. Saddle embolus at the innominate artery bifurcation into the right common carotid artery and right subclavian artery. This embolus is nonocclusive. 3. Atherosclerotic changes at the carotid bifurcations and cavernous internal carotid arteries bilaterally without significant stenosis. 4. Atherosclerotic calcifications with moderate stenosis in the proximal left vertebral artery. These results were called by telephone at the time of interpretation on 10/15/2016 at 12:30 pm to Dr. Leonel Ramsay , who verbally acknowledged these results.  Statin:  Yes. Beta Blocker:  Yes.   Aspirin:  Yes.   ACEI:  Yes.   ARB:  No. CCB use:  Yes Other antiplatelets/anticoagulants:  No.    ASSESSMENT/PLAN: This is a 66 y.o. male admitted with CVA and has saddle embolus at the innominate artery bifurcation into the right common carotid artery and right subclavian artery. This embolus is nonocclusive.   -Dr. Donzetta Matters examined pt at bedside and had discussion with wife and son.  Pt may need thrombectomy, but given the risk of further stroke and pt's hx of stage 4 lung CA with mets, will need to have further extensive discussion with pt and family.  Prefer to wean pt off sedation to determine neuro status and discuss intervention with pt and family.   -Dr. Donzetta Matters will be back by for further discussions.   Leontine Locket, PA-C [facility] [phone]  I have interviewed the patient along with PA and agree with assessment above. I have spoken with Drs. Deveshwar and Xu as well as the family on 3 separate occasions. This is really a difficult situation with apparent embolus at the bifurcation of the innominate artery in patient with stage IV lung cancer and recent neuro salvage of the left middle cerebral artery. He is grossly neurologically intact although intubated he can follow commands in all 4 extremities. He has monophasic signals of his right  upper extremity and the brachial radial and ulnar arteries. The left side these arteries are easily palpable. We have started heparin drip without bolus given the saddle embolus and discuss with family that there is some risk of hemorrhagic conversion of his previous stroke. I also discussed the risks of proceeding with common carotid and subclavian artery embolectomy that include stroke, MI, blood vessel injury, nerve injury, wound healing issues, anesthetic of locations up to and including death. I returned to discuss this with him further and they do want to proceed and the patient is able to give his blessing although intubated at this time. I told him he would not be a good candidate for sternotomy as his lung cancer appears to be in close proximity. We will proceed with right common carotid and subclavian artery thrombectomy today.  Brandon C. Donzetta Matters, MD [facility] [place] Office: [phone] Pager: [phone]

## 2016-10-16 NOTE — Progress Notes (Signed)
Preliminary results by tech - Venous Duplex Lower Ext. Completed. No evidence of acute deep or superficial vein thrombosis in the lower extremities. Oda Cogan, BS, RDMS, RVT

## 2016-10-16 NOTE — Progress Notes (Signed)
Pt back from OR and placed back on vent on previous settings.  RT will continue to monitor.

## 2016-10-16 NOTE — Op Note (Signed)
Patient name: Ralph Lopez MRN: [id number] DOB: 1950/08/18 Sex: male  10/16/2016 Pre-operative Diagnosis: right common carotid and subclavian artery embolus Post-operative diagnosis:  Same Surgeon:  Erlene Quan C. Donzetta Matters, MD Assistant:  Gae Gallop, MD Procedure Performed: Right common carotid artery and subclavian artery thromboembolectomy  Indications:  66 year old male with recent left-sided MCA stroke status post endovascular salvage. He also has a right common carotid is occluded artery saddle embolus and is now indicated for thromboembolectomy.  Findings: The right common carotid artery and subclavian artery were free of atherosclerotic disease. There was a large gelatinous single embolus retrieved from the subclavian arteriotomy. At completion there was expected Doppler signal in the common carotid artery and subclavian artery as well as palpable brachial and radial arteries on the right which were not there on preop exam.   Procedure:  The patient was identified in the holding area and taken to the operating room where he was placed supine on the operating table general anesthesia was induced he was sterilely prepped and draped in his neck chest and right upper extremity given antibiotics and timeout called. We did proceed with transfusion of 2 units of blood at the beginning of the case. We then made a transverse incision approximately 8 cm in length with the above the clavicle from the lateral aspect of the sternocleidomastoid muscle. We stayed indented skin and subcutaneous tissue divided the platysma. Thoracotomy was identified in the lateral aspect was divided. We then began mobilizing the fat pad from medial to lateral and inferior to cephalad. We identified our neurovascular bundle of our carotid and protected our vagus nerve which was large and lateral. We dissected our common carotid up higher in our incision but did not encircle it. We then turned our attention inferiorly. Deep to  the fat pad where divide the phrenic nerve protector this and divided a very robust anterior scalene muscle with electrocautery. Deep this identified our subclavian artery which was palpable with a weak pulse. We dissected this out proximal to the vertebral artery. We did identify our IMA which was clipped as well as another superior branch. The vessel loop was placed around the vertebral artery. At this time the patient was given 4000 units of heparin. We then placed a vessel loop proximally and distally around the subclavian artery and the same with the common carotid artery. Reportedly at this time there was an issue with the ET tube and this had to be replaced by anesthesia and we waited to proceed. After approximately 5 minutes we proceed with the operation. The blood pressure was raised to 161mHg. We first clamped the distal aspect of both her subclavian and common carotid arteries. We maintain control her common carotid artery opened it transversely with 11 blade followed by Potts scissors. We did have minimal flow through it we passed a 4 Fogarty only returned very little what appeared to be thrombus material. We passed again returned nothing. We then clamped the proximal aspect of the common carotid artery. We then controlled our proximal subclavian artery with our vessel loop and opened this longitudinally with the distal and clamped. We passed the Fogarty through this returned very little thrombus material. We then passed from it again and returned very large gelatinous appearing thrombus. This was sent for specimen. Respiratory one more time nothing returned we had significant antegrade bleeding. The subclavian artery was then clamped proximally and we closed it with a running 5-0 Prolene suture. We did require repair 6-0 Prolene as well. We  turned our attention back to the common carotid artery and again passed a Fogarty proximally through there and had no return. This was reclamped and closed with 5-0  Prolene suture. We did allow backbleeding and then for bleeding. Doppler demonstrated expected flow common carotid artery as well as our subclavian artery. There was a palpable brachial and radial pulses the right arm. Satisfied with this patient was given '50mg'$  of protamine which he tolerated well. We did obtain hemostasis our wound contact our fat pad to our SCM} 03 0 Vicryl and the skin with 4-0 Vicryl. Patient will be transferred to the ICU in stable condition.  Blood loss 600 mL.  2 units packed red blood cells given.    Arna Luis C. Donzetta Matters, MD [facility] [place] Office: [phone] Pager: [phone]

## 2016-10-16 NOTE — Progress Notes (Signed)
   Patient evaluated and resting comfortably intubated with lose dose of propofol infusing. He is arousable and follows commands and nods head appropriately to questions. Right neck is soft with in tact incision. Will follow.   [name] C. Donzetta Matters, MD [facility] [place] Office: [phone] Pager: [phone]

## 2016-10-16 NOTE — Progress Notes (Signed)
[facility] for heparin Indication: arterial thrombus  Heparin Dosing Weight: 72.6 kg   Assessment: 27 yom admitted with acute L MCA CVA s/p revascularization, found to have saddle embolus at innominate artery bifurcation into R common carotid artery and R subclavian artery. Pharmacy consulted to dose heparin for arterial thrombus (no bolus, conservative dosing with lower goal due to risk of hemorrhagic conversion). Hg down 8.4, plt WNL. No bleed documented. No anticoagulation medications PTA or ordered inpatient, just scds.  Goal of Therapy:  Heparin level 0.3-0.5 units/ml Monitor platelets by anticoagulation protocol: Yes   Plan:  No boluses Start heparin at 950 units/h 6h heparin level Daily heparin level/CBC Monitor s/sx bleeding   Elicia Lamp, PharmD, BCPS Clinical Pharmacist 10/16/2016 11:41 AM

## 2016-10-17 ENCOUNTER — Inpatient Hospital Stay (HOSPITAL_COMMUNITY): Payer: BLUE CROSS/BLUE SHIELD

## 2016-10-17 ENCOUNTER — Encounter (HOSPITAL_COMMUNITY): Payer: Self-pay | Admitting: Vascular Surgery

## 2016-10-17 DIAGNOSIS — J9690 Respiratory failure, unspecified, unspecified whether with hypoxia or hypercapnia: Secondary | ICD-10-CM

## 2016-10-17 DIAGNOSIS — J939 Pneumothorax, unspecified: Secondary | ICD-10-CM

## 2016-10-17 DIAGNOSIS — J9600 Acute respiratory failure, unspecified whether with hypoxia or hypercapnia: Secondary | ICD-10-CM

## 2016-10-17 DIAGNOSIS — I6521 Occlusion and stenosis of right carotid artery: Secondary | ICD-10-CM

## 2016-10-17 DIAGNOSIS — Z978 Presence of other specified devices: Secondary | ICD-10-CM

## 2016-10-17 DIAGNOSIS — I6789 Other cerebrovascular disease: Secondary | ICD-10-CM

## 2016-10-17 DIAGNOSIS — Z9911 Dependence on respirator [ventilator] status: Secondary | ICD-10-CM

## 2016-10-17 LAB — TYPE AND SCREEN
ABO/RH(D): A NEG
Antibody Screen: NEGATIVE
UNIT DIVISION: 0
UNIT DIVISION: 0

## 2016-10-17 LAB — PHOSPHORUS
PHOSPHORUS: 2.8 mg/dL (ref 2.5–4.6)
PHOSPHORUS: 2.8 mg/dL (ref 2.5–4.6)

## 2016-10-17 LAB — BPAM RBC
BLOOD PRODUCT EXPIRATION DATE: 201804232359
Blood Product Expiration Date: 201804252359
ISSUE DATE / TIME: 201803291516
ISSUE DATE / TIME: 201803291516
UNIT TYPE AND RH: 600
Unit Type and Rh: 600

## 2016-10-17 LAB — BASIC METABOLIC PANEL
ANION GAP: 11 (ref 5–15)
BUN: 16 mg/dL (ref 6–20)
CALCIUM: 9.6 mg/dL (ref 8.9–10.3)
CHLORIDE: 111 mmol/L (ref 101–111)
CO2: 22 mmol/L (ref 22–32)
Creatinine, Ser: 1.03 mg/dL (ref 0.61–1.24)
GFR calc non Af Amer: >60 mL/min (ref >60)
GLUCOSE: 140 mg/dL — AB (ref 65–99)
Potassium: 3.6 mmol/L (ref 3.5–5.1)
Sodium: 144 mmol/L (ref 135–145)

## 2016-10-17 LAB — HEMOGLOBIN A1C
HEMOGLOBIN A1C: 5.8 % — AB (ref 4.8–5.6)
MEAN PLASMA GLUCOSE: 120 mg/dL

## 2016-10-17 LAB — CBC
HEMATOCRIT: 28.9 % — AB (ref 39.0–52.0)
Hemoglobin: 9.6 g/dL — ABNORMAL LOW (ref 13.0–17.0)
MCH: 28.8 pg (ref 26.0–34.0)
MCHC: 33.2 g/dL (ref 30.0–36.0)
MCV: 86.8 fL (ref 78.0–100.0)
Platelets: 244 10*3/uL (ref 150–400)
RBC: 3.33 MIL/uL — AB (ref 4.22–5.81)
RDW: 14.6 % (ref 11.5–15.5)
WBC: 11.4 10*3/uL — AB (ref 4.0–10.5)

## 2016-10-17 LAB — GLUCOSE, CAPILLARY
GLUCOSE-CAPILLARY: 161 mg/dL — AB (ref 65–99)
GLUCOSE-CAPILLARY: 106 mg/dL — AB (ref 65–99)
Glucose-Capillary: 139 mg/dL — ABNORMAL HIGH (ref 65–99)
Glucose-Capillary: 153 mg/dL — ABNORMAL HIGH (ref 65–99)
Glucose-Capillary: 189 mg/dL — ABNORMAL HIGH (ref 65–99)
Glucose-Capillary: 228 mg/dL — ABNORMAL HIGH (ref 65–99)

## 2016-10-17 LAB — MAGNESIUM
MAGNESIUM: 1.8 mg/dL (ref 1.7–2.4)
Magnesium: 1.9 mg/dL (ref 1.7–2.4)

## 2016-10-17 LAB — ECHOCARDIOGRAM COMPLETE

## 2016-10-17 MED ORDER — FENTANYL CITRATE (PF) 100 MCG/2ML IJ SOLN
INTRAMUSCULAR | Status: AC
  Administered 2016-10-17: 10:00:00
  Filled 2016-10-17: qty 2

## 2016-10-17 MED ORDER — LIDOCAINE HCL (PF) 1 % IJ SOLN
INTRAMUSCULAR | Status: AC
  Administered 2016-10-17: 30 mL
  Filled 2016-10-17: qty 30

## 2016-10-17 MED ORDER — FENTANYL CITRATE (PF) 100 MCG/2ML IJ SOLN
200.0000 ug | Freq: Once | INTRAMUSCULAR | Status: AC
  Administered 2016-10-17: 200 ug via INTRAVENOUS

## 2016-10-17 MED ORDER — PRO-STAT SUGAR FREE PO LIQD
30.0000 mL | Freq: Every day | ORAL | Status: DC
  Administered 2016-10-18 – 2016-10-21 (×4): 30 mL
  Filled 2016-10-17 (×4): qty 30

## 2016-10-17 MED ORDER — METHYLPREDNISOLONE SODIUM SUCC 40 MG IJ SOLR
40.0000 mg | Freq: Three times a day (TID) | INTRAMUSCULAR | Status: DC
  Administered 2016-10-17 – 2016-10-20 (×9): 40 mg via INTRAVENOUS
  Filled 2016-10-17 (×10): qty 1

## 2016-10-17 MED ORDER — IPRATROPIUM-ALBUTEROL 0.5-2.5 (3) MG/3ML IN SOLN
3.0000 mL | Freq: Four times a day (QID) | RESPIRATORY_TRACT | Status: DC
  Administered 2016-10-17 – 2016-10-26 (×34): 3 mL via RESPIRATORY_TRACT
  Filled 2016-10-17 (×30): qty 3

## 2016-10-17 MED ORDER — FENTANYL CITRATE (PF) 100 MCG/2ML IJ SOLN
INTRAMUSCULAR | Status: AC
  Filled 2016-10-17: qty 4

## 2016-10-17 MED ORDER — VITAL AF 1.2 CAL PO LIQD
1000.0000 mL | ORAL | Status: DC
  Administered 2016-10-17 – 2016-10-21 (×5): 1000 mL

## 2016-10-17 MED ORDER — MAGNESIUM SULFATE 2 GM/50ML IV SOLN
2.0000 g | Freq: Once | INTRAVENOUS | Status: AC
  Administered 2016-10-17: 2 g via INTRAVENOUS
  Filled 2016-10-17: qty 50

## 2016-10-17 MED ORDER — MIDAZOLAM HCL 2 MG/2ML IJ SOLN
2.0000 mg | Freq: Once | INTRAMUSCULAR | Status: AC
  Administered 2016-10-17: 2 mg via INTRAVENOUS

## 2016-10-17 MED ORDER — MIDAZOLAM HCL 2 MG/2ML IJ SOLN
INTRAMUSCULAR | Status: AC
  Filled 2016-10-17: qty 4

## 2016-10-17 MED ORDER — FENTANYL CITRATE (PF) 100 MCG/2ML IJ SOLN
25.0000 ug | INTRAMUSCULAR | Status: DC | PRN
  Administered 2016-10-18 – 2016-10-23 (×47): 100 ug via INTRAVENOUS
  Filled 2016-10-17 (×48): qty 2

## 2016-10-17 MED ORDER — FENTANYL CITRATE (PF) 100 MCG/2ML IJ SOLN: 100.0000 ug | Freq: Once | INTRAMUSCULAR | Status: AC

## 2016-10-17 MED ORDER — PROPOFOL 1000 MG/100ML IV EMUL
5.0000 ug/kg/min | INTRAVENOUS | Status: DC
Start: 2016-10-17 — End: 2016-10-19
  Administered 2016-10-17: 10 ug/kg/min via INTRAVENOUS
  Administered 2016-10-18: 15 ug/kg/min via INTRAVENOUS
  Administered 2016-10-18 – 2016-10-19 (×2): 25 ug/kg/min via INTRAVENOUS
  Filled 2016-10-17 (×3): qty 100

## 2016-10-17 MED ORDER — MIDAZOLAM HCL 2 MG/2ML IJ SOLN
2.0000 mg | Freq: Once | INTRAMUSCULAR | Status: AC
Start: 2016-10-17 — End: 2016-10-17
  Administered 2016-10-17: 2 mg via INTRAVENOUS

## 2016-10-17 MED ORDER — MIDAZOLAM HCL 2 MG/2ML IJ SOLN
INTRAMUSCULAR | Status: AC
  Filled 2016-10-17: qty 2

## 2016-10-17 MED FILL — Medication: Qty: 1 | Status: AC

## 2016-10-17 NOTE — Progress Notes (Signed)
Initial Nutrition Assessment  INTERVENTION:   Vital AF 1.2 @ 55 ml/hr (1320 ml/day) 30 ml Prostat daily Provides: 1684 kcal, 114 grams protein, and 1070 ml free water.  TF regimen and propofol at current rate providing 1800 total kcal/day (101 % of kcal needs)  NUTRITION DIAGNOSIS:   Inadequate oral intake related to inability to eat as evidenced by NPO status.  GOAL:   Patient will meet greater than or equal to 90% of their needs  MONITOR:   TF tolerance, Vent status, Labs  REASON FOR ASSESSMENT:   Consult Enteral/tube feeding initiation and management  ASSESSMENT:   Pt with hx of stage 4 lung cancer, COPD, DM, HTN, CKD, and depression admitted with large L MCA infarct s/p complete revascularization 3/28 and R thromboembolectomy for saddle embolus 3/29. Failed extubated 3/30 and was emergently re-intubated.    Pt discussed during ICU rounds and with RN.  Weight appears stable for the last 4 months.   Patient is currently intubated on ventilator support MV: 11 L/min Temp (24hrs), Avg:98.4 F (36.9 C), Min:97.7 F (36.5 C), Max:98.7 F (37.1 C)  Propofol: 4.4 ml/hr provides: 116 kcal  Nutrition-Focused physical exam completed. Findings are no fat depletion, mild/moderate muscle depletion, and no edema.  No family at bedside.    Diet Order:  Diet NPO time specified  Skin:  Reviewed, no issues  Last BM:  unknown  Height:   Ht Readings from Last 1 Encounters:  10/17/16 '5\' 10"'$  (1.778 m)    Weight:   Wt Readings from Last 1 Encounters:  10/14/16 160 lb 0.9 oz (72.6 kg)    Ideal Body Weight:  75.4 kg  BMI:  There is no height or weight on file to calculate BMI.  Estimated Nutritional Needs:   Kcal:  7782  Protein:  100-115 grams  Fluid:  > 1.7 L/day  EDUCATION NEEDS:   No education needs identified at this time  Barber, [place], [place] Pager [phone] After Hours Pager

## 2016-10-17 NOTE — Progress Notes (Signed)
PULMONARY / CRITICAL CARE MEDICINE   Name: Ralph Lopez MRN: [id number] DOB: 05-09-1951    ADMISSION DATE:  10/15/2016 CONSULTATION DATE:  10/15/2016  REFERRING MD:  Dr. Leonel Ramsay   CHIEF COMPLAINT:  Left MCA S/P revascularization   Brief:   66 year old male with PMH of DM, HTN, HLD, CKD, COPD, Stage 4 Lung CA, and Depression. Presents to ED on 3/28 as a code stroke. Wife reports seeing him at 0300 in his normal state when he got up to go to the bathroom. Later in the morning wife found him on the ground of the kitchen aphasic. 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 Upon arrival to the ED CT revealed a large left MCA. Patient was intubated and then underwent a left common carotid arteriogram followed followed by complete revascularization. PCCM asked to consult for vent management.   Of note patient is being treated with carboplatin and Taxol. Has had radiation therapy with a planned PET soon. However, he has been complaining of hip pain in which he was told was due to a pelvic mass.   SUBJECTIVE:  Weaning, note new rt pnx with rib fxs noted Failed extubation and had resp arrest and re intubated. SQ air on repeat c x r.  VITAL SIGNS: BP (!) 164/81 (BP Location: Left Arm) Comment: Propofol and Neo turned off to prepare for extubation.  Pulse 91   Temp 98.7 F (37.1 C) (Axillary)   Resp (!) 27   SpO2 100%   HEMODYNAMICS:    VENTILATOR SETTINGS: Vent Mode: PSV;CPAP FiO2 (%):  [40 %] 40 % Set Rate:  [14 bmp] 14 bmp Vt Set:  [500 mL] 500 mL PEEP:  [5 cmH20] 5 cmH20 Pressure Support:  [5 cmH20] 5 cmH20 Plateau Pressure:  [16 cmH20-20 cmH20] 19 cmH20  INTAKE / OUTPUT: I/O last 3 completed shifts: In: 6284 [I.V.:5614; Blood:670] Out: 3096 [Urine:2496; Blood:600]  PHYSICAL EXAMINATION: General:  Adult male, no distress  Neuro:  Awake and follows commands   HEENT:  Et ok  Cardiovascular:  RRR HSR Lungs:  Clear breath sounds, non-labored , decreased bs  bases Abdomen:  +bs Musculoskeletal:  intact Skin:  WDIt   LABS:  BMET  Recent Labs Lab 10/15/16 1143 10/15/16 1147 10/16/16 0557 10/16/16 1515 10/17/16 0500  NA 138 139 140 144 144  K 3.6 3.6 3.6 3.6 3.6  CL 102 101 108  --  111  CO2 25  --  22  --  22  BUN '15 17 14  '$ --  16  CREATININE 1.59* 1.50* 1.08  --  1.03  GLUCOSE 188* 190* 129*  --  140*    Electrolytes  Recent Labs Lab 10/15/16 1143 10/16/16 0557 10/17/16 0500  CALCIUM 10.4* 10.0 9.6  MG  --   --  1.9  PHOS  --   --  2.8    CBC  Recent Labs Lab 10/15/16 1143  10/16/16 0557 10/16/16 1515 10/17/16 0500  WBC 11.9*  --  11.9*  --  11.4*  HGB 10.3*  < > 8.4* 6.8* 9.6*  HCT 32.4*  < > 26.3* 20.0* 28.9*  PLT 253  --  294  --  244  < > = values in this interval not displayed.  Coag's  Recent Labs Lab 10/15/16 1143  APTT 22*  INR 1.09    Sepsis Markers No results for input(s): LATICACIDVEN, PROCALCITON, O2SATVEN in the last 168 hours.  ABG  Recent Labs Lab 10/15/16 1730 10/16/16  1515  PHART 7.424 7.383  PCO2ART 37.3 40.0  PO2ART 142* 502.0*    Liver Enzymes  Recent Labs Lab 10/15/16 1143  AST 18  ALT 8*  ALKPHOS 53  BILITOT 0.5  ALBUMIN 3.0*    Cardiac Enzymes No results for input(s): TROPONINI, PROBNP in the last 168 hours.  Glucose  Recent Labs Lab 10/16/16 0818 10/16/16 1158 10/16/16 1928 10/16/16 2337 10/17/16 0323 10/17/16 0758  GLUCAP 117* 115* 147* 115* 153* 139*    Imaging No results found.  STUDIES:  CTA Head/Neck 3/28 > Thrombus in the distal left M1 Segment to the bifurcation, flow is present in reduced caliber left MCA branch vessels. Thrombus is nonocclusive. Saddle embolus at the innominate artery bifurcation into the right common carotid artery and right subclavian artery.  CT Head 3/28 > Improvement of left MCA following clot retrieval. Remains low -density in the left inferior temporal lobe and insular cortex with edema which has improvement  since earlier  MRI Head 3/29 > Left MCA distribution diffusion restriction is present in the left insula, left anterior temporal lobe, and left lateral frontal lobe in distribution similar to CT perfusion CBF less than 30%, there is a small additional area of diffusion restriction within the left parietal lobe.   CULTURES: None.   ANTIBIOTICS: Ancef 3/28 > 3/28  SIGNIFICANT EVENTS: 3/28 > Presents code stroke with Left MCA > Revascularization  3/30 extubated and promptly had resp arrest and reintubation with brief <1 min cpr and noo drugs required and awake post arrest.   LINES/TUBES: ETT 3/28 >>3/30  3/30 re intubated URGENTLY>>   DISCUSSION: 66 year old male with Stage 4 lung CA presents to ED as code stroke. Imaging revealed Left MCA, patient underwent revascularization and post-op was transferred to ICU intubated. Ready for extubation 3/30. Failed and had resp arrest and reintubated.  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 Maintain Saturation >92 Check c x r prior to extubation. Found rt pnx + rib fx this + Lung cancer will impede extubation. Place chest tube today  CARDIOVASCULAR A:  Saddle embolus to right common carotid artery and right subclavian artery, post interventions H/O HTN, HLD P:  Cardiac Monitoring  ECHO pending  Wean Cleviprex for Systolic Goal [id number] PRN Labetalol  Continue Lipitor and ASA D/C Aline  No anticoagulation per Neuro except ASA. This may not be thrombus accodding to Vascular surgery . RENAL Lab Results  Component Value Date   CREATININE 1.03 10/17/2016   CREATININE 1.08 10/16/2016   CREATININE 1.50 (H) 10/15/2016   CREATININE 1.50 (H) 04/25/2014   CREATININE 1.72 (H) 04/24/2014    Recent Labs Lab 10/16/16 0557  10/16/16 1515 10/17/16 0500  K 3.6 3.6 3.6     A:   CKD (Bas Crt 1.4-1.7) P:   Trend BMP Replace electrolytes as needed Avoid Nephrotoxic medications   GASTROINTESTINAL A:   Dysphagia  GERD P:   Speech Evaluation post-extubation  PPI NPO  HEMATOLOGIC A:   Stage 4 Lung CA with Mets  P:  Trend CBC SCDS  INFECTIOUS A:   No issues  P:   Trend WBC and fever curve   ENDOCRINE CBG (last 3)   Recent Labs  10/16/16 2337 10/17/16 0323 10/17/16 0758  GLUCAP 115* 153* 139*  A:   DM   P:   SSI Q4H glucose checks   NEUROLOGIC A:   Left MCA s/p revascularization  H/O Depression  P:   RASS goal: 0 Per Neurology  DC Propofol to wean from vent   FAMILY  - Updates: Family updated at bedside about possible extubation.  - Inter-disciplinary family meet or Palliative Care meeting due by: 10/22/2016  App CC Time: 35 minutes  Richardson Landry Yovan Leeman ACNP Maryanna Shape PCCM Pager [phone] till 3 pm If no answer page [phone] 10/17/2016, 8:32 AM

## 2016-10-17 NOTE — Progress Notes (Signed)
STROKE TEAM PROGRESS NOTE   SUBJECTIVE (INTERVAL HISTORY) Patient was seen and examined this morning. Patient denies pain or discomfort. Right side of neck is mildly tender. No acute events overnight. Yesterday, patient went for Right CC and R subclavian artery thrombectomy with removal of large gelatinous single embolus with vascular surgery. Intervention was successful. Weaning well this morning.   Patient stable for extubation, but during extubation procedure, patient developed respiratory distress leading to cardiac arrest. ACLS was initiated, but pulses quickly regained after several compression and airway stabilization. Patient was subsequently reintubated.   OBJECTIVE Temp:  [97.7 F (36.5 C)-98.7 F (37.1 C)] 98.7 F (37.1 C) (03/30 0800) Pulse Rate:  [35-94] 83 (03/30 1100) Cardiac Rhythm: Normal sinus rhythm (03/30 1000) Resp:  [12-27] 26 (03/30 1100) BP: (112-166)/(68-83) 138/78 (03/30 1100) SpO2:  [0 %-100 %] 100 % (03/30 1159) Arterial Line BP: (106-172)/(52-72) 139/63 (03/30 1100) FiO2 (%):  [40 %-100 %] 40 % (03/30 1159)   Recent Labs Lab 10/16/16 1928 10/16/16 2337 10/17/16 0323 10/17/16 0758 10/17/16 1127  GLUCAP 147* 115* 153* 139* 161*    Recent Labs Lab 10/15/16 1143 10/15/16 1147 10/16/16 0557 10/16/16 1515 10/17/16 0500  NA 138 139 140 144 144  K 3.6 3.6 3.6 3.6 3.6  CL 102 101 108  --  111  CO2 25  --  22  --  22  GLUCOSE 188* 190* 129*  --  140*  BUN '15 17 14  '$ --  16  CREATININE 1.59* 1.50* 1.08  --  1.03  CALCIUM 10.4*  --  10.0  --  9.6  MG  --   --   --   --  1.9  PHOS  --   --   --   --  2.8    Recent Labs Lab 10/15/16 1143  AST 18  ALT 8*  ALKPHOS 53  BILITOT 0.5  PROT 7.0  ALBUMIN 3.0*    Recent Labs Lab 10/15/16 1143 10/15/16 1147 10/16/16 0557 10/16/16 1515 10/17/16 0500  WBC 11.9*  --  11.9*  --  11.4*  NEUTROABS 10.0*  --  11.1*  --   --   HGB 10.3* 10.5* 8.4* 6.8* 9.6*  HCT 32.4* 31.0* 26.3* 20.0* 28.9*  MCV  90.3  --  88.6  --  86.8  PLT 253  --  294  --  244    Recent Labs  10/15/16 1143  LABPROT 14.2  INR 1.09    Recent Labs  10/15/16 1721  COLORURINE STRAW*  LABSPEC 1.027  PHURINE 6.0  GLUCOSEU NEGATIVE  HGBUR NEGATIVE  BILIRUBINUR NEGATIVE  KETONESUR NEGATIVE  PROTEINUR NEGATIVE  NITRITE NEGATIVE  LEUKOCYTESUR NEGATIVE       Component Value Date/Time   CHOL 112 10/16/2016 0557   CHOL 153 01/02/2016 0952   CHOL 135 07/03/2015 1055   TRIG 140 10/16/2016 0557   TRIG 94 07/03/2015 1055   HDL 25 (L) 10/16/2016 0557   HDL 30 (L) 01/02/2016 0952   CHOLHDL 4.5 10/16/2016 0557   VLDL 28 10/16/2016 0557   VLDL 19 07/03/2015 1055   LDLCALC 59 10/16/2016 0557   LDLCALC 97 01/02/2016 0952   Lab Results  Component Value Date   HGBA1C 5.8 (H) 10/16/2016      Component Value Date/Time   LABOPIA NONE DETECTED 10/15/2016 1720   COCAINSCRNUR NONE DETECTED 10/15/2016 1720   LABBENZ NONE DETECTED 10/15/2016 1720   AMPHETMU NONE DETECTED 10/15/2016 1720   THCU NONE DETECTED 10/15/2016 1720  LABBARB NONE DETECTED 10/15/2016 1720     Recent Labs Lab 10/15/16 [place] <5    I have personally reviewed the radiological images below and agree with the radiology interpretations.  Ct Angio Head W Or Wo Contrast  Result Date: 10/15/2016 CLINICAL DATA:  Acute onset right-sided weakness and abnormal speech. EXAM: CT ANGIOGRAPHY HEAD AND NECK TECHNIQUE: Multidetector CT imaging of the head and neck was performed using the standard protocol during bolus administration of intravenous contrast. Multiplanar CT image reconstructions and MIPs were obtained to evaluate the vascular anatomy. Carotid stenosis measurements (when applicable) are obtained utilizing NASCET criteria, using the distal internal carotid diameter as the denominator. CONTRAST:  90 mL Isovue 370 COMPARISON:  MRI brain 05/14/2016 FINDINGS: CTA NECK FINDINGS Aortic arch: Atherosclerotic calcifications are present at the  aortic arch without compromise of the great vessel origins. There is no aneurysm. Right carotid system: The a saddle embolus is present at bifurcation of the innominate artery and to the right common carotid artery and subclavian artery. This is nonocclusive. There is some nonocclusive thrombus extending more superiorly within the right common carotid artery is well. Atherosclerotic changes are present at the right carotid bifurcation without significant stenosis. Left carotid system: The left common carotid artery is within normal limits. Atherosclerotic changes are present at left carotid bifurcation. There is calcified plaque proximally. Noncalcified posterior plaque more distally narrows the with 3 mm. This compares with a distal measurement of 4.5 mm. Posterior calcification just below the skullbase may reflect a remote injury. There is no associated stenosis. Vertebral arteries: The vertebral arteries both originate from the subclavian arteries. There is no focal stenosis of the origin. The left vertebral artery the dominant vessel. There is no significant stenosis cannot air vertebral artery in the. Dense atherosclerotic calcifications narrowing of the left vertebral artery limiting to 1.5 mm. Skeleton: Vertebral body heights and alignment are normal. There is no focal lytic or blastic lesion. The patient is edentulous. Other neck: No significant cervical adenopathy is present. No focal mucosal or submucosal lesions are present. The submandibular and parotid glands are within normal limits bilaterally. Upper chest: A medial right upper lobe chest mass decreased in size from the previous PET scan. The lesion measures 6.0 x 7.3 cm. There is postobstructive pneumonitis in the right upper lobe. A right pleural effusion is present. Ill-defined opacity in the superior segment of the left lower lobe is incompletely imaged. Review of the MIP images confirms the above findings CTA HEAD FINDINGS Anterior circulation:  Atherosclerotic calcifications are present in precavernous cavernous internal carotid arteries bilaterally without a significant stenosis relative to the more distal vessel. The terminus is intact bilaterally. The A1 segments are normal. The right M1 segment is normal. A nonocclusive thrombus is present in the distal left M1 segment at the MCA bifurcation. MCA branch vessels opacify the. They are of decreased caliber compared to the MCA branch vessels on the right. The area some atherosclerotic irregularity a MCA branch vessels. No focal occlusion is evident. Increased collaterals are evident on the left. Posterior circulation: Moderate stenosis is present in the dominant left vertebral artery at the dural margin. PICA origins are visualized and normal. The basilar artery is normal. Both posterior cerebral arteries originate from basilar tip. There is mild irregularity without focal stenosis or occlusion. Venous sinuses: Dural sinuses are patent. The left transverse sinus dominant. Anatomic variants: None Delayed phase: 2 punctate areas of enhancement in the right frontal lobe from previous MRI are not seen  on today's study. No areas of enhancement are evident on the source images. Review of the MIP images confirms the above findings IMPRESSION: 1. Thrombus in the distal left M1 segment to the bifurcation. Flow is present in reduced caliber left MCA branch vessels. The thrombus is likely nonocclusive. Collateral vessels are also present. 2. Saddle embolus at the innominate artery bifurcation into the right common carotid artery and right subclavian artery. This embolus is nonocclusive. 3. Atherosclerotic changes at the carotid bifurcations and cavernous internal carotid arteries bilaterally without significant stenosis. 4. Atherosclerotic calcifications with moderate stenosis in the proximal left vertebral artery. These results were called by telephone at the time of interpretation on 10/15/2016 at 12:30 pm to Dr.  Leonel Ramsay , who verbally acknowledged these results. Electronically Signed   By: San Morelle M.D.   On: 10/15/2016 13:05   Ct Head Wo Contrast  Result Date: 10/15/2016 CLINICAL DATA:  Stroke.  Post endovascular revascularization EXAM: CT HEAD WITHOUT CONTRAST TECHNIQUE: Contiguous axial images were obtained from the base of the skull through the vertex without intravenous contrast. COMPARISON:  CT head 10/07/2016 FINDINGS: Brain: Improvement in hypodensity in the left MCA territory compared with the earlier scan. There remains hypodensity in the left inferior temporal lobe extending into the insular cortex compatible with infarction. There is edema effacing the sylvian fissure on the left. Low-density in the frontal operculum has significantly improved as has the edema in the insular cortex. Negative for acute hemorrhage. Ventricle size normal. 2 mm midline shift to the right due to edema. Vascular: Normal arterial and venous enhancement due to prior cerebral angiogram. Skull: Negative Sinuses/Orbits: Mild mucosal edema paranasal sinuses. Other: None IMPRESSION: Improvement in left MCA hypodensity following clot retrieval. There remains low-density in the left inferior temporal lobe and insular cortex with edema which has improved since earlier today. No acute hemorrhage.  Mild midline shift to the right. Electronically Signed   By: Franchot Gallo M.D.   On: 10/15/2016 14:50   Ct Angio Neck W Or Wo Contrast  Result Date: 10/15/2016 CLINICAL DATA:  Acute onset right-sided weakness and abnormal speech. EXAM: CT ANGIOGRAPHY HEAD AND NECK TECHNIQUE: Multidetector CT imaging of the head and neck was performed using the standard protocol during bolus administration of intravenous contrast. Multiplanar CT image reconstructions and MIPs were obtained to evaluate the vascular anatomy. Carotid stenosis measurements (when applicable) are obtained utilizing NASCET criteria, using the distal internal carotid  diameter as the denominator. CONTRAST:  90 mL Isovue 370 COMPARISON:  MRI brain 05/14/2016 FINDINGS: CTA NECK FINDINGS Aortic arch: Atherosclerotic calcifications are present at the aortic arch without compromise of the great vessel origins. There is no aneurysm. Right carotid system: The a saddle embolus is present at bifurcation of the innominate artery and to the right common carotid artery and subclavian artery. This is nonocclusive. There is some nonocclusive thrombus extending more superiorly within the right common carotid artery is well. Atherosclerotic changes are present at the right carotid bifurcation without significant stenosis. Left carotid system: The left common carotid artery is within normal limits. Atherosclerotic changes are present at left carotid bifurcation. There is calcified plaque proximally. Noncalcified posterior plaque more distally narrows the with 3 mm. This compares with a distal measurement of 4.5 mm. Posterior calcification just below the skullbase may reflect a remote injury. There is no associated stenosis. Vertebral arteries: The vertebral arteries both originate from the subclavian arteries. There is no focal stenosis of the origin. The left vertebral artery the dominant vessel.  There is no significant stenosis cannot air vertebral artery in the. Dense atherosclerotic calcifications narrowing of the left vertebral artery limiting to 1.5 mm. Skeleton: Vertebral body heights and alignment are normal. There is no focal lytic or blastic lesion. The patient is edentulous. Other neck: No significant cervical adenopathy is present. No focal mucosal or submucosal lesions are present. The submandibular and parotid glands are within normal limits bilaterally. Upper chest: A medial right upper lobe chest mass decreased in size from the previous PET scan. The lesion measures 6.0 x 7.3 cm. There is postobstructive pneumonitis in the right upper lobe. A right pleural effusion is present.  Ill-defined opacity in the superior segment of the left lower lobe is incompletely imaged. Review of the MIP images confirms the above findings CTA HEAD FINDINGS Anterior circulation: Atherosclerotic calcifications are present in precavernous cavernous internal carotid arteries bilaterally without a significant stenosis relative to the more distal vessel. The terminus is intact bilaterally. The A1 segments are normal. The right M1 segment is normal. A nonocclusive thrombus is present in the distal left M1 segment at the MCA bifurcation. MCA branch vessels opacify the. They are of decreased caliber compared to the MCA branch vessels on the right. The area some atherosclerotic irregularity a MCA branch vessels. No focal occlusion is evident. Increased collaterals are evident on the left. Posterior circulation: Moderate stenosis is present in the dominant left vertebral artery at the dural margin. PICA origins are visualized and normal. The basilar artery is normal. Both posterior cerebral arteries originate from basilar tip. There is mild irregularity without focal stenosis or occlusion. Venous sinuses: Dural sinuses are patent. The left transverse sinus dominant. Anatomic variants: None Delayed phase: 2 punctate areas of enhancement in the right frontal lobe from previous MRI are not seen on today's study. No areas of enhancement are evident on the source images. Review of the MIP images confirms the above findings IMPRESSION: 1. Thrombus in the distal left M1 segment to the bifurcation. Flow is present in reduced caliber left MCA branch vessels. The thrombus is likely nonocclusive. Collateral vessels are also present. 2. Saddle embolus at the innominate artery bifurcation into the right common carotid artery and right subclavian artery. This embolus is nonocclusive. 3. Atherosclerotic changes at the carotid bifurcations and cavernous internal carotid arteries bilaterally without significant stenosis. 4.  Atherosclerotic calcifications with moderate stenosis in the proximal left vertebral artery. These results were called by telephone at the time of interpretation on 10/15/2016 at 12:30 pm to Dr. Leonel Ramsay , who verbally acknowledged these results. Electronically Signed   By: San Morelle M.D.   On: 10/15/2016 13:05   Mr Brain Wo Contrast  Result Date: 10/16/2016 CLINICAL DATA:  66 y/o M; left MCA distribution infarct post revascularization. EXAM: MRI HEAD WITHOUT CONTRAST TECHNIQUE: Multiplanar, multiecho pulse sequences of the brain and surrounding structures were obtained without intravenous contrast. COMPARISON:  10/15/2016 CT angiogram of the head. FINDINGS: Brain: Diffusion restriction is present within the left insula, left anterior temporal lobe, and left lateral frontal lobe in distribution similar to CT perfusion CBF less than 30%. There is an additional area of diffusion restriction within the left parietal lobe not indicated on the perfusion study. Areas of infarction demonstrate T2 FLAIR hyperintense signal abnormality and mild local mass effect. Motion degraded sagittal T1 weighted sequence. Small chronic infarction is present within the right parietal cortex. There are mild chronic microvascular ischemic changes of white matter and mild brain parenchymal volume loss. No abnormal susceptibility hypointensity is  present to indicate intracranial hemorrhage. No hydrocephalus or extra-axial collection. Vascular: Normal flow voids. Skull and upper cervical spine: Normal marrow signal. Sinuses/Orbits: Negative. Other: None. IMPRESSION: Left MCA distribution diffusion restriction is present in the left insula, left anterior temporal lobe, and left lateral frontal lobe in distribution similar to CT perfusion CBF less than 30%. There is a small additional area of diffusion restriction within the left parietal lobe not indicated on the perfusion study. Findings are compatible with acute infarction.  No associated hemorrhage identified. Electronically Signed   By: Kristine Garbe M.D.   On: 10/16/2016 04:49   Ir Angiogram Extremity Right  Result Date: 10/16/2016 INDICATION: Global aphasia. Right-sided weakness. CT perfusion examination revealed mismatch volume of 131 mL with the CBF < 30% volume of 43 mL, and a mismatch ratio of 4.0. Large near complete occlusive filling defect in the distal right middle cerebral artery extending into the bifurcation middle cerebral artery just proximal to the bifurcation and extending into the bifurcation. EXAM: 1. EMERGENT LARGE VESSEL OCCLUSION THROMBOLYSIS (anterior CIRCULATION) COMPARISON:  CT angiogram of 10/15/2016. MEDICATIONS: Ancef 2 g IV. The antibiotic was administered within 1 hour of the procedure. ANESTHESIA/SEDATION: General anesthesia. CONTRAST:  Isovue 300 approximately 60 mL. FLUOROSCOPY TIME:  Fluoroscopy Time: 10 minutes 12 seconds (837 mGy). COMPLICATIONS: None immediate. TECHNIQUE: Following a full explanation of the procedure along with the potential associated complications, an informed witnessed consent was obtained. The risks of intracranial hemorrhage of 10%, worsening neurological deficit, ventilator dependency, death and inability to revascularize were all reviewed in detail with the patient's wife. The patient was then put under general anesthesia by the Department of Anesthesiology at [facility]. The right groin was prepped and draped in the usual sterile fashion. Thereafter using modified Seldinger technique, transfemoral access into the right common femoral artery was obtained without difficulty. Over a 0.035 inch guidewire a 5 French Pinnacle sheath was inserted. Through this, and also over a 0.035 inch guidewire a 5 [place] JB 1 catheter was advanced to the aortic arch region and selectively positioned in the innominate artery and the left common carotid artery. FINDINGS: The innominate arteriogram demonstrates a long  segment lobulated filling defect which extends from the mid innominate artery across the origin of the right common carotid artery and the right vertebral artery. No angiographic flow is noted in the right vertebral artery. However, there is flow noted in the right common carotid artery. The right common carotid artery on the lateral projection demonstrates wide patency at the right carotid artery bifurcation to the supraclinoid segment with opacification of the visualized right MCA distribution on the lateral projection. The left common carotid arteriogram demonstrates the origin of the left external carotid artery to be patent. The opacified portions of the left external carotid artery appear patent. The left internal carotid artery at the bulb to the cranial skull base demonstrates wide patency, with a small shelf-like plaque noted along the posterior wall of the left internal carotid artery at the distal aspect of the bulb. The left internal carotid artery is seen to opacify to the cranial skull base. The petrous segment is widely patent. There is a focal stenoses of approximately 30% of the caval cavernous segment of the left internal carotid artery. Distal to this, the distal cavernous and the supraclinoid segments are widely patent. The left middle cerebral artery in its M1 segment demonstrates patency. There is attenuated caliber in the distal left M1 segment with near complete occlusion extending into the inferior  division with near complete occlusion of the superior division. Multiple filling defects are seen in this region. The left anterior cerebral artery is seen to opacify normally into the capillary and venous phases. The delayed arterial phase demonstrates partial retrograde opacification of the perisylvian branches from the pericallosal and callosal marginal branches. Also noted is prompt opacification via the anterior communicating artery of the right anterior cerebral A2 segment and the right  anterior cerebral A1 segment. Opacification of the right middle cerebral artery M1 segment and distally is also noted from the left common carotid artery injection. PROCEDURE: The diagnostic JB 1 catheter in the left common carotid artery was then exchanged over a 0.035 inch 300 cm Rosen exchange guidewire for a 55 cm 8 French Brite tip neurovascular sheath using biplane roadmap technique and constant fluoroscopic guidance. Good aspiration was obtained from the hub of the 8 French neurovascular sheath. This was then connected to continuous heparinized saline infusion. Over the [employer] guidewire, a 95 cm 8 [place] FlowGate balloon guide catheter which been prepped with 50% contrast and 50% heparinized saline infusion was then advanced and positioned in the left common carotid artery. The guidewire was removed. Good aspiration was obtained from the hub of the 8 [place] FlowGate guide catheter. A gentle constant injection demonstrated no evidence of spasms, dissections or of intraluminal filling defects. Over a 0.035 inch Roadrunner guidewire, using biplane roadmap technique and constant fluoroscopic guidance, the 8 [place] FlowGate guide catheter was then advanced to the cervical petrous junction of the left internal carotid artery. The guidewire was removed. Good aspiration was obtained from the hub of the 8 [place] FlowGate balloon guide catheter. A gentle contrast injection demonstrated no evidence spasms, dissections or of intraluminal filling defects. At this time, in a coaxial manner and with constant heparinized saline infusion using biplane roadmap technique and constant fluoroscopic guidance, a Trevo ProVue 021 microcatheter was advanced over a 0.014 inch Softip Synchro micro guidewire to the distal end of the 8 [place] FlowGate guide catheter. With the micro guidewire leading with a J-tip configuration, the combination was navigated without difficulty to the supraclinoid left ICA. A torque device was then  utilized to advance the micro guidewire to the left middle cerebral artery followed by the microcatheter. The micro guidewire was then advanced without difficulty through the inferior division of left middle cerebral artery into the M2 M3 region followed by the microcatheter. The micro guidewire was removed. Good aspiration was obtained from the hub of the microcatheter. Gentle contrast injection demonstrated a brisk antegrade flow distally. A 4 mm x 40 mm Solitaire FR retrieval device was then purged with 50% contrast and 50% heparinized saline infusion. Thereafter this was advanced again in a coaxial manner and with constant heparinized saline infusion using biplane roadmap technique and constant fluoroscopic guidance to the distal end of the microcatheter. The O ring on the delivery microcatheter was then loosened. With slight forward gentle traction with the right hand on the delivery micro guidewire with the left hand the delivery microcatheter was retrieved unsheathing the distal end and then the proximal portion of the retrieval device. The tip of the microcatheter was just proximal to the proximal portion of the retrieval device. A brisk control arteriogram performed through the 8 [place] FlowGate guide catheter in the left internal carotid artery demonstrated brisk flow through the inferior division and partially through the superior division though improved. A TICI 2b reperfusion was noted. The balloon in the distal left internal carotid artery FlowGate guide  catheter was then inflated for proximal flow arrest. The proximal portion of the retrieval device was then captured into the microcatheter. There on after with constant aspiration being applied with a 60 mL syringe at the hub of the [facility] guide catheter, the combination of the retrieval device and the microcatheter were retrieved and removed. Aspiration was continued as the balloon was deflated. Free back bleed was noted at the hub of 8 [place]  FlowGate guide catheter. The aspirate contained 2 chunks of clots. Also noted in the [employer] was a another piece of mixed bloody and fibrosis clot. A control arteriogram performed through the 8 [place] FlowGate guide catheter in the left internal carotid artery demonstrated complete angiographic revascularization of the occluded left middle cerebral artery distribution. No angiographic evidence of filling defects or occlusions or stenosis was seen. No evidence of extravasation, or mass-effect on the major vessel was noted. Moderate spasm was noted in the inferior division of the left middle cerebral artery which responded promptly to 2 aliquots of 25 mics of nitroglycerin given through the [facility] guide catheter. A final control arteriogram performed through the [facility] guide catheter in the left internal carotid artery demonstrated complete angiographic revascularization of the left MCA distribution. The left anterior cerebral artery appeared patent with brisk flow into the contralateral cerebral hemisphere as described previously. The patient's neurological status and hemodynamic status remained stable throughout the procedure. The 8 [place] FlowGate guide catheter and the 8 [place] Brite tip neurovascular sheath were retrieved in the abdominal aorta and exchanged over a J-tip guidewire for an 8 [place] Pinnacle sheath. This was then removed successfully with the application of an external closure device with compression at the puncture site for 20 minutes. The groin site appeared soft without evidence of a hematoma. The distal pulses remained stable palpable bilaterally and unchanged compared to prior to the procedure. IMPRESSION: Status post endovascular complete revascularization of left MCA occlusion with 1 pass with the Solitaire FR 4 mm x 40 mm retrieval device achieving a TICI 3 reperfusion. Groin puncture time 13:12. Revascularization with TICI 2b at 13:35. Revascularization with TICI 3 at 13:39. PLAN:  Patient to CT scanner for postprocedural CT scan of brain. Electronically Signed   By: Luanne Bras M.D.   On: 10/15/2016 21:34   Ct Cerebral Perfusion W Contrast  Result Date: 10/15/2016 CLINICAL DATA:  Acute onset of right sided weakness and abnormal speech. Lung cancer. EXAM: CT PERFUSION BRAIN TECHNIQUE: Multiphase CT imaging of the brain was performed following IV bolus contrast injection. Subsequent parametric perfusion maps were calculated using RAPID software. CONTRAST:  90 mL Isovue 370 COMPARISON:  MRI brain 05/14/2016. FINDINGS: CT Brain Perfusion Findings: CBF (<30%) Volume: 71m Perfusion (Tmax>6.0s) volume: 1744mMismatch Volume: 13118mnfarction Location:Left MCA territory. Arterial and venous and was are excellent. Minimal patient motion is evident. IMPRESSION: 1. The acute infarct involving the left MCA territory with estimated volume of CT a less than 30% at 43 mL. 2. Mismatch volume 131 mL. These results were called by telephone at the time of interpretation on 10/15/2016 at 12:30 pm to Dr. KIRLeonel Ramsaywho verbally acknowledged these results. Electronically Signed   By: ChrSan MorelleD.   On: 10/15/2016 12:39   Dg Chest Port 1 View  Result Date: 10/17/2016 CLINICAL DATA:  Post endotracheal tube placement EXAM: PORTABLE CHEST 1 VIEW COMPARISON:  10/17/2016 FINDINGS: Cardiomediastinal silhouette is stable. Again noted is right hilar mass. Nodular metastasis left lung are stable. Endotracheal tube in place with tip  3.4 cm above the carina. Extensive subcutaneous emphysema right chest wall again noted. Stable small right lateral pneumothorax. Subcutaneous emphysema right supraclavicular region P IMPRESSION: Again noted is right hilar mass. Nodular metastasis left lung are stable. Endotracheal tube in place with tip 3.4 cm above the carina. Extensive subcutaneous emphysema right chest wall again noted. Stable small right lateral pneumothorax. Electronically Signed   By: Lahoma Crocker M.D.   On: 10/17/2016 10:30   Dg Chest Port 1 View  Result Date: 10/17/2016 CLINICAL DATA:  Intubation. EXAM: PORTABLE CHEST 1 VIEW COMPARISON:  Radiograph of October 16, 2016. FINDINGS: Endotracheal tube is approximately 5 cm above the carina in grossly good position. Large right hilar mass in left pulmonary lesions are noted consistent with metastatic disease. There is interval development of mild right basilar pneumothorax with subcutaneous emphysema seen over the right lateral chest wall. Right rib fractures are again noted. IMPRESSION: Endotracheal tube in grossly good position. Stable large right hilar mass and multiple left pulmonary nodules are noted concerning for metastatic disease. Interval development of mild right basilar pneumothorax with associated subcutaneous emphysema overlying right lateral chest wall. Right rib fractures are again noted. Critical Value/emergent results were called by telephone at the time of interpretation on 10/17/2016 at 9:03 am to Dr. Asa Saunas , who verbally acknowledged these results. Electronically Signed   By: Marijo Conception, M.D.   On: 10/17/2016 09:03   Dg Chest Port 1 View  Result Date: 10/16/2016 CLINICAL DATA:  Ventilator dependent respiratory failure. History of CVA, COPD, lung malignancy, acute right rib fractures. EXAM: PORTABLE CHEST 1 VIEW COMPARISON:  Portable chest x-ray of October 15, 2016 FINDINGS: The endotracheal tube tip lies approximately 1.5 cm above the superior margin of the clavicular heads. The lungs are reasonably well inflated. A large right hilar mass is stable. Multiple pulmonary parenchymal masses in the mid and lower left lung also are stable. There is no pneumothorax or pleural effusion. Fractures of the lateral aspects of the right sixth and seventh ribs are again observed. The heart and pulmonary vascularity are normal. IMPRESSION: High positioning of the endotracheal tube. Advancement by at least 5 cm would be useful. The  remainder the findings in the chest are stable. Electronically Signed   By: Jabbar  Martinique M.D.   On: 10/16/2016 07:43   Dg Chest Port 1 View  Result Date: 10/15/2016 CLINICAL DATA:  Status post intubation EXAM: PORTABLE CHEST 1 VIEW COMPARISON:  04/28/2016 FINDINGS: Cardiac shadow is within normal limits. Multiple pulmonary mass lesions are noted particularly in the region of the right hilum but scattered throughout both lungs. An endotracheal tube is noted in satisfactory position. No pneumothorax is noted. Multiple right rib fractures are seen. IMPRESSION: Multiple right rib fractures. Endotracheal tube in satisfactory position. Changes consistent with lung carcinoma and multiple scattered pulmonary lesions. These have progressed in the interval from the prior exam of 05/02/2016 Electronically Signed   By: Inez Catalina M.D.   On: 10/15/2016 16:18   Ir Percutaneous Art Thrombectomy/infusion Intracranial Inc Diag Angio  Result Date: 10/16/2016 INDICATION: Global aphasia. Right-sided weakness. CT perfusion examination revealed mismatch volume of 131 mL with the CBF < 30% volume of 43 mL, and a mismatch ratio of 4.0. Large near complete occlusive filling defect in the distal right middle cerebral artery extending into the bifurcation middle cerebral artery just proximal to the bifurcation and extending into the bifurcation. EXAM: 1. EMERGENT LARGE VESSEL OCCLUSION THROMBOLYSIS (anterior CIRCULATION) COMPARISON:  CT angiogram of  10/15/2016. MEDICATIONS: Ancef 2 g IV. The antibiotic was administered within 1 hour of the procedure. ANESTHESIA/SEDATION: General anesthesia. CONTRAST:  Isovue 300 approximately 60 mL. FLUOROSCOPY TIME:  Fluoroscopy Time: 10 minutes 12 seconds (837 mGy). COMPLICATIONS: None immediate. TECHNIQUE: Following a full explanation of the procedure along with the potential associated complications, an informed witnessed consent was obtained. The risks of intracranial hemorrhage of 10%,  worsening neurological deficit, ventilator dependency, death and inability to revascularize were all reviewed in detail with the patient's wife. The patient was then put under general anesthesia by the Department of Anesthesiology at [facility]. The right groin was prepped and draped in the usual sterile fashion. Thereafter using modified Seldinger technique, transfemoral access into the right common femoral artery was obtained without difficulty. Over a 0.035 inch guidewire a 5 [place] Pinnacle sheath was inserted. Through this, and also over a 0.035 inch guidewire a 5 [place] JB 1 catheter was advanced to the aortic arch region and selectively positioned in the innominate artery and the left common carotid artery. FINDINGS: The innominate arteriogram demonstrates a long segment lobulated filling defect which extends from the mid innominate artery across the origin of the right common carotid artery and the right vertebral artery. No angiographic flow is noted in the right vertebral artery. However, there is flow noted in the right common carotid artery. The right common carotid artery on the lateral projection demonstrates wide patency at the right carotid artery bifurcation to the supraclinoid segment with opacification of the visualized right MCA distribution on the lateral projection. The left common carotid arteriogram demonstrates the origin of the left external carotid artery to be patent. The opacified portions of the left external carotid artery appear patent. The left internal carotid artery at the bulb to the cranial skull base demonstrates wide patency, with a small shelf-like plaque noted along the posterior wall of the left internal carotid artery at the distal aspect of the bulb. The left internal carotid artery is seen to opacify to the cranial skull base. The petrous segment is widely patent. There is a focal stenoses of approximately 30% of the caval cavernous segment of the left internal  carotid artery. Distal to this, the distal cavernous and the supraclinoid segments are widely patent. The left middle cerebral artery in its M1 segment demonstrates patency. There is attenuated caliber in the distal left M1 segment with near complete occlusion extending into the inferior division with near complete occlusion of the superior division. Multiple filling defects are seen in this region. The left anterior cerebral artery is seen to opacify normally into the capillary and venous phases. The delayed arterial phase demonstrates partial retrograde opacification of the perisylvian branches from the pericallosal and callosal marginal branches. Also noted is prompt opacification via the anterior communicating artery of the right anterior cerebral A2 segment and the right anterior cerebral A1 segment. Opacification of the right middle cerebral artery M1 segment and distally is also noted from the left common carotid artery injection. PROCEDURE: The diagnostic JB 1 catheter in the left common carotid artery was then exchanged over a 0.035 inch 300 cm Rosen exchange guidewire for a 55 cm 8 [place] Brite tip neurovascular sheath using biplane roadmap technique and constant fluoroscopic guidance. Good aspiration was obtained from the hub of the 8 French neurovascular sheath. This was then connected to continuous heparinized saline infusion. Over the [facility] exchange guidewire, a 95 cm 8 [place] FlowGate balloon guide catheter which been prepped with 50% contrast and 50% heparinized  saline infusion was then advanced and positioned in the left common carotid artery. The guidewire was removed. Good aspiration was obtained from the hub of the 8 [place] FlowGate guide catheter. A gentle constant injection demonstrated no evidence of spasms, dissections or of intraluminal filling defects. Over a 0.035 inch Roadrunner guidewire, using biplane roadmap technique and constant fluoroscopic guidance, the 8 [place] FlowGate guide  catheter was then advanced to the cervical petrous junction of the left internal carotid artery. The guidewire was removed. Good aspiration was obtained from the hub of the 8 [place] FlowGate balloon guide catheter. A gentle contrast injection demonstrated no evidence spasms, dissections or of intraluminal filling defects. At this time, in a coaxial manner and with constant heparinized saline infusion using biplane roadmap technique and constant fluoroscopic guidance, a Trevo ProVue 021 microcatheter was advanced over a 0.014 inch Softip Synchro micro guidewire to the distal end of the 8 [place] FlowGate guide catheter. With the micro guidewire leading with a J-tip configuration, the combination was navigated without difficulty to the supraclinoid left ICA. A torque device was then utilized to advance the micro guidewire to the left middle cerebral artery followed by the microcatheter. The micro guidewire was then advanced without difficulty through the inferior division of left middle cerebral artery into the M2 M3 region followed by the microcatheter. The micro guidewire was removed. Good aspiration was obtained from the hub of the microcatheter. Gentle contrast injection demonstrated a brisk antegrade flow distally. A 4 mm x 40 mm Solitaire FR retrieval device was then purged with 50% contrast and 50% heparinized saline infusion. Thereafter this was advanced again in a coaxial manner and with constant heparinized saline infusion using biplane roadmap technique and constant fluoroscopic guidance to the distal end of the microcatheter. The O ring on the delivery microcatheter was then loosened. With slight forward gentle traction with the right hand on the delivery micro guidewire with the left hand the delivery microcatheter was retrieved unsheathing the distal end and then the proximal portion of the retrieval device. The tip of the microcatheter was just proximal to the proximal portion of the retrieval device. A  brisk control arteriogram performed through the 8 [place] FlowGate guide catheter in the left internal carotid artery demonstrated brisk flow through the inferior division and partially through the superior division though improved. A TICI 2b reperfusion was noted. The balloon in the distal left internal carotid artery FlowGate guide catheter was then inflated for proximal flow arrest. The proximal portion of the retrieval device was then captured into the microcatheter. There on after with constant aspiration being applied with a 60 mL syringe at the hub of the [facility] guide catheter, the combination of the retrieval device and the microcatheter were retrieved and removed. Aspiration was continued as the balloon was deflated. Free back bleed was noted at the hub of 8 [place] FlowGate guide catheter. The aspirate contained 2 chunks of clots. Also noted in the [employer] was a another piece of mixed bloody and fibrosis clot. A control arteriogram performed through the 8 [place] FlowGate guide catheter in the left internal carotid artery demonstrated complete angiographic revascularization of the occluded left middle cerebral artery distribution. No angiographic evidence of filling defects or occlusions or stenosis was seen. No evidence of extravasation, or mass-effect on the major vessel was noted. Moderate spasm was noted in the inferior division of the left middle cerebral artery which responded promptly to 2 aliquots of 25 mics of nitroglycerin given through the [facility] guide catheter.  A final control arteriogram performed through the [facility] guide catheter in the left internal carotid artery demonstrated complete angiographic revascularization of the left MCA distribution. The left anterior cerebral artery appeared patent with brisk flow into the contralateral cerebral hemisphere as described previously. The patient's neurological status and hemodynamic status remained stable throughout the procedure. The 8  [place] FlowGate guide catheter and the 8 [place] Brite tip neurovascular sheath were retrieved in the abdominal aorta and exchanged over a J-tip guidewire for an 8 [place] Pinnacle sheath. This was then removed successfully with the application of an external closure device with compression at the puncture site for 20 minutes. The groin site appeared soft without evidence of a hematoma. The distal pulses remained stable palpable bilaterally and unchanged compared to prior to the procedure. IMPRESSION: Status post endovascular complete revascularization of left MCA occlusion with 1 pass with the Solitaire FR 4 mm x 40 mm retrieval device achieving a TICI 3 reperfusion. Groin puncture time 13:12. Revascularization with TICI 2b at 13:35. Revascularization with TICI 3 at 13:39. PLAN: Patient to CT scanner for postprocedural CT scan of brain. Electronically Signed   By: Luanne Bras M.D.   On: 10/15/2016 21:34   Dg Hip Unilat With Pelvis 2-3 Views Left  Result Date: 10/10/2016 CLINICAL DATA:  Left hip pain, history of metastatic lung cancer. EXAM: DG HIP (WITH OR WITHOUT PELVIS) 2-3V LEFT COMPARISON:  None. FINDINGS: The hip joints appear normal. However, large lytic lesion is seen involving the left superior and inferior pubic rami consistent with metastatic lesion. Sacroiliac joints appear normal. IMPRESSION: Large lytic lesion seen involving the left superior and inferior pubic rami consistent with metastatic lesion. These results will be called to the ordering clinician or representative by the Radiologist Assistant, and communication documented in the PACS or zVision Dashboard. Electronically Signed   By: Marijo Conception, M.D.   On: 10/10/2016 13:45   Ct Head Code Stroke W/o Cm  Result Date: 10/15/2016 CLINICAL DATA:  Code stroke. Acute onset of right-sided weakness. Difficulty with speech. On lung cancer. EXAM: CT HEAD WITHOUT CONTRAST TECHNIQUE: Contiguous axial images were obtained from the base of  the skull through the vertex without intravenous contrast. COMPARISON:  None. FINDINGS: Brain: Areas of hypoattenuation involve the left insular cortex. Additional hypoattenuation is present in the left frontal operculum and the super ganglionic left frontal lobe. Decreased attenuation is present within the left temporal tip. There is no hemorrhage or mass lesion. Ventricles are normal size. No other acute infarct present. Vascular: Atherosclerotic calcifications are present at the cavernous internal carotid arteries and left greater than right vertebral arteries without a hyperdense vessel. Skull: No focal lytic or blastic lesions are present. Sinuses/Orbits: Paranasal sinuses and mastoid air cells are clear. ASPECTS [facility] Stroke Program Early CT Score) - Ganglionic level infarction (caudate, lentiform nuclei, internal capsule, insula, M1-M3 cortex): 5/7 - Supraganglionic infarction (M4-M6 cortex): 2/3 Total score (0-10 with 10 being normal): 7/10 IMPRESSION: 1. Acute nonhemorrhagic infarct involving the left MCA territory with involvement of the left insular cortex, left frontal operculum, left temporal tip, and left super ganglionic frontal lobe. 2. ASPECTS is 7/10 These results were called by telephone at the time of interpretation on 10/15/2016 at 12:30 pm to Dr. Leonel Ramsay , who verbally acknowledged these results. Electronically Signed   By: San Morelle M.D.   On: 10/15/2016 12:33   Ir Angio Intra Extracran Sel Com Carotid Innominate Uni R Mod Sed  Result Date: 10/16/2016 INDICATION: Global aphasia. Right-sided  weakness. CT perfusion examination revealed mismatch volume of 131 mL with the CBF < 30% volume of 43 mL, and a mismatch ratio of 4.0. Large near complete occlusive filling defect in the distal right middle cerebral artery extending into the bifurcation middle cerebral artery just proximal to the bifurcation and extending into the bifurcation. EXAM: 1. EMERGENT LARGE VESSEL OCCLUSION  THROMBOLYSIS (anterior CIRCULATION) COMPARISON:  CT angiogram of 10/15/2016. MEDICATIONS: Ancef 2 g IV. The antibiotic was administered within 1 hour of the procedure. ANESTHESIA/SEDATION: General anesthesia. CONTRAST:  Isovue 300 approximately 60 mL. FLUOROSCOPY TIME:  Fluoroscopy Time: 10 minutes 12 seconds (837 mGy). COMPLICATIONS: None immediate. TECHNIQUE: Following a full explanation of the procedure along with the potential associated complications, an informed witnessed consent was obtained. The risks of intracranial hemorrhage of 10%, worsening neurological deficit, ventilator dependency, death and inability to revascularize were all reviewed in detail with the patient's wife. The patient was then put under general anesthesia by the Department of Anesthesiology at [facility]. The right groin was prepped and draped in the usual sterile fashion. Thereafter using modified Seldinger technique, transfemoral access into the right common femoral artery was obtained without difficulty. Over a 0.035 inch guidewire a 5 [place] Pinnacle sheath was inserted. Through this, and also over a 0.035 inch guidewire a 5 [place] JB 1 catheter was advanced to the aortic arch region and selectively positioned in the innominate artery and the left common carotid artery. FINDINGS: The innominate arteriogram demonstrates a long segment lobulated filling defect which extends from the mid innominate artery across the origin of the right common carotid artery and the right vertebral artery. No angiographic flow is noted in the right vertebral artery. However, there is flow noted in the right common carotid artery. The right common carotid artery on the lateral projection demonstrates wide patency at the right carotid artery bifurcation to the supraclinoid segment with opacification of the visualized right MCA distribution on the lateral projection. The left common carotid arteriogram demonstrates the origin of the left external  carotid artery to be patent. The opacified portions of the left external carotid artery appear patent. The left internal carotid artery at the bulb to the cranial skull base demonstrates wide patency, with a small shelf-like plaque noted along the posterior wall of the left internal carotid artery at the distal aspect of the bulb. The left internal carotid artery is seen to opacify to the cranial skull base. The petrous segment is widely patent. There is a focal stenoses of approximately 30% of the caval cavernous segment of the left internal carotid artery. Distal to this, the distal cavernous and the supraclinoid segments are widely patent. The left middle cerebral artery in its M1 segment demonstrates patency. There is attenuated caliber in the distal left M1 segment with near complete occlusion extending into the inferior division with near complete occlusion of the superior division. Multiple filling defects are seen in this region. The left anterior cerebral artery is seen to opacify normally into the capillary and venous phases. The delayed arterial phase demonstrates partial retrograde opacification of the perisylvian branches from the pericallosal and callosal marginal branches. Also noted is prompt opacification via the anterior communicating artery of the right anterior cerebral A2 segment and the right anterior cerebral A1 segment. Opacification of the right middle cerebral artery M1 segment and distally is also noted from the left common carotid artery injection. PROCEDURE: The diagnostic JB 1 catheter in the left common carotid artery was then exchanged over a 0.035  inch 300 cm Rosen exchange guidewire for a 55 cm 8 [place] Brite tip neurovascular sheath using biplane roadmap technique and constant fluoroscopic guidance. Good aspiration was obtained from the hub of the 8 [place] neurovascular sheath. This was then connected to continuous heparinized saline infusion. Over the [employer] guidewire, a  95 cm 8 [place] FlowGate balloon guide catheter which been prepped with 50% contrast and 50% heparinized saline infusion was then advanced and positioned in the left common carotid artery. The guidewire was removed. Good aspiration was obtained from the hub of the 8 [place] FlowGate guide catheter. A gentle constant injection demonstrated no evidence of spasms, dissections or of intraluminal filling defects. Over a 0.035 inch Roadrunner guidewire, using biplane roadmap technique and constant fluoroscopic guidance, the 8 [place] FlowGate guide catheter was then advanced to the cervical petrous junction of the left internal carotid artery. The guidewire was removed. Good aspiration was obtained from the hub of the 8 [place] FlowGate balloon guide catheter. A gentle contrast injection demonstrated no evidence spasms, dissections or of intraluminal filling defects. At this time, in a coaxial manner and with constant heparinized saline infusion using biplane roadmap technique and constant fluoroscopic guidance, a Trevo ProVue 021 microcatheter was advanced over a 0.014 inch Softip Synchro micro guidewire to the distal end of the 8 [place] FlowGate guide catheter. With the micro guidewire leading with a J-tip configuration, the combination was navigated without difficulty to the supraclinoid left ICA. A torque device was then utilized to advance the micro guidewire to the left middle cerebral artery followed by the microcatheter. The micro guidewire was then advanced without difficulty through the inferior division of left middle cerebral artery into the M2 M3 region followed by the microcatheter. The micro guidewire was removed. Good aspiration was obtained from the hub of the microcatheter. Gentle contrast injection demonstrated a brisk antegrade flow distally. A 4 mm x 40 mm Solitaire FR retrieval device was then purged with 50% contrast and 50% heparinized saline infusion. Thereafter this was advanced again in a coaxial  manner and with constant heparinized saline infusion using biplane roadmap technique and constant fluoroscopic guidance to the distal end of the microcatheter. The O ring on the delivery microcatheter was then loosened. With slight forward gentle traction with the right hand on the delivery micro guidewire with the left hand the delivery microcatheter was retrieved unsheathing the distal end and then the proximal portion of the retrieval device. The tip of the microcatheter was just proximal to the proximal portion of the retrieval device. A brisk control arteriogram performed through the 8 [place] FlowGate guide catheter in the left internal carotid artery demonstrated brisk flow through the inferior division and partially through the superior division though improved. A TICI 2b reperfusion was noted. The balloon in the distal left internal carotid artery FlowGate guide catheter was then inflated for proximal flow arrest. The proximal portion of the retrieval device was then captured into the microcatheter. There on after with constant aspiration being applied with a 60 mL syringe at the hub of the [facility] guide catheter, the combination of the retrieval device and the microcatheter were retrieved and removed. Aspiration was continued as the balloon was deflated. Free back bleed was noted at the hub of 8 [place] FlowGate guide catheter. The aspirate contained 2 chunks of clots. Also noted in the [employer] was a another piece of mixed bloody and fibrosis clot. A control arteriogram performed through the 8 [place] FlowGate guide catheter in the left internal  carotid artery demonstrated complete angiographic revascularization of the occluded left middle cerebral artery distribution. No angiographic evidence of filling defects or occlusions or stenosis was seen. No evidence of extravasation, or mass-effect on the major vessel was noted. Moderate spasm was noted in the inferior division of the left middle cerebral  artery which responded promptly to 2 aliquots of 25 mics of nitroglycerin given through the [facility] guide catheter. A final control arteriogram performed through the [facility] guide catheter in the left internal carotid artery demonstrated complete angiographic revascularization of the left MCA distribution. The left anterior cerebral artery appeared patent with brisk flow into the contralateral cerebral hemisphere as described previously. The patient's neurological status and hemodynamic status remained stable throughout the procedure. The 8 [place] FlowGate guide catheter and the 8 [place] Brite tip neurovascular sheath were retrieved in the abdominal aorta and exchanged over a J-tip guidewire for an 8 [place] Pinnacle sheath. This was then removed successfully with the application of an external closure device with compression at the puncture site for 20 minutes. The groin site appeared soft without evidence of a hematoma. The distal pulses remained stable palpable bilaterally and unchanged compared to prior to the procedure. IMPRESSION: Status post endovascular complete revascularization of left MCA occlusion with 1 pass with the Solitaire FR 4 mm x 40 mm retrieval device achieving a TICI 3 reperfusion. Groin puncture time 13:12. Revascularization with TICI 2b at 13:35. Revascularization with TICI 3 at 13:39. PLAN: Patient to CT scanner for postprocedural CT scan of brain. Electronically Signed   By: Luanne Bras M.D.   On: 10/15/2016 21:34   2D Echocardiogram pending  EKG sinus rhythm. For complete results please see formal report.  PHYSICAL EXAM Vitals:   10/17/16 1000 10/17/16 1020 10/17/16 1100 10/17/16 1159  BP: (!) 164/79  138/78   Pulse: (!) 40  83   Resp: 19  (!) 26   Temp:      TempSrc:      SpO2: 100% 100% 100% 100%   General: Vital signs reviewed.  Patient is Chronically ill appearing, appears older than stated age, intubated in no acute distress and cooperative with exam.   Cardiovascular: RRR Pulmonary/Chest: Clear to auscultation, no wheezes  Abdominal: Soft, non-tender, non-distended, BS +.  Extremities: No lower extremity edema bilaterally, pulses symmetric and intact bilaterally.  Neurological: Awake, alert. Off of sedation. Patient moves all extremities. 4/5 weakness in right upper extremity, 5/5 right lower extremity, 4/5 left lower extremity. 5/5 in left upper extremity. Normal sensation throughout. Patient follows commands, but some evidence of receptive aphasia. Following commands, some difficultly with peripheral commands. Extraocular muscles intact, PERRLA  ASSESSMENT/PLAN Ralph Lopez is a 66 y.o. male with history of hypertension, type 2 diabetes, hyperlipidemia, CKD, COPD, stage IV lung cancer undergoing active treatment admitted for achalasia, right sided weakness and facial droop and found to have an acute left MCA CVA status post revascularization.   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   CT Perfusion: The acute infarct involving the left MCA territory with estimated volume of CT a less than 30% at 43 mL. Mismatch volume 131 mL.  CT Angio head/neck: Thrombus in the distal left M1 segment to the bifurcation. Flow is present in reduced caliber left MCA branch vessels. The thrombus  is likely nonocclusive. Saddle embolus at the innominate artery bifurcation into the right common carotid artery and right subclavian artery. This embolus is nonocclusive.  CT Head: Improvement in left MCA hypodensity following clot retrieval.   S/p Right CC and R subclavian artery thrombectomy with removal of large gelatinous single embolus with vascular surgery on 3/30- unclear if blood clot or tissue- sent to pathology  Echo: 65-70%,  grade 1 diastolic, no evidence of vegetation  LDL 59  HgbA1c 5.8  Heparin for VTE prophylaxis  Diet NPO time specified   aspirin 81 mg daily prior to admission, now on aspirin 325 mg daily  Patient will be counseled to be compliant with his antithrombotic medications  Ongoing aggressive stroke risk factor management  Therapy recommendations:  pending  Disposition:  Pending  Left Common Carotid Thrombus Non-Occlusive s/p Revascularization with IR Right Common Carotid Thrombus and Right Subclavian Thrombus and Stenosis Non-Occlusive  CT Angio head/neck: Thrombus in the distal left M1 segment to the bifurcation. Flow is present in reduced caliber left MCA branch vessels. The thrombus is likely nonocclusive. Saddle embolus at the innominate artery bifurcation into the right common carotid artery and right subclavian artery. This embolus is nonocclusive.  S/p Right CC and R subclavian artery thrombectomy with removal of large gelatinous single embolus with vascular surgery on 3/30- unclear if blood clot or tissue- sent to pathology  Off anticoagulation  Diabetes  5.8, goal < 7.0  Currently on SSI  CBG monitoring  DM education  Hypertension  Home meds:   Amlodipine 5 mg daily, atenolol 100 mg daily, lisinopril 40 mg daily Keep systolic blood pressure [phone]  Currently on Neo-Synephrine after cardiac arrest  Patient will be counseled to be compliant with his blood pressure medications  Hyperlipidemia  Home meds:  Atorvastatin 10 mg daily  Currently on atorvastatin 10 mg daily  LDL 59, goal < 70  Continue statin at discharge  Other Stroke Risk Factors  History of tobacco abuse  Active malignancy  Other Active Problems  Chronic normocytic anemia-hemoglobin 8.4  Mild leukocytosis of 11.9, afebrile  Cardiac arrest 2/2 to respiratory arrest during extubation- now reintubated, on Neo-Synephrine  Stable small right lateral pneumothorax  Other Pertinent  History  Stage IV Lung Cancer: Has been treated with carboplatin and Taxol, given new evidence of 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 After hours, contact General Neurology

## 2016-10-17 NOTE — Progress Notes (Signed)
PT Cancellation Note  Patient Details Name: Ralph Lopez MRN: [id number] DOB: 1951-01-19   Cancelled Treatment:    Reason Eval/Treat Not Completed: Medical issues which prohibited therapy;Patient not medically ready Pt coded this AM during extubation. Re-intubated and sedated. Will cancel PT until pt medically appropriate.     Marguarite Arbour A Mirabel Ahlgren 10/17/2016, 10:26 AM Wray Kearns, [place], DPT [phone]

## 2016-10-17 NOTE — Progress Notes (Signed)
SLP Cancellation Note  Patient Details Name: TRAN ARZUAGA MRN: [id number] DOB: [birth year]   Cancelled treatment:       Reason Eval/Treat Not Completed: Patient not medically ready, remains intubated this morning. Per notes, pt may be appropriate for extubation today. Will f/u as able.   Germain Osgood 10/17/2016, 8:46 AM  Germain Osgood, M.A. CCC-SLP [phone]

## 2016-10-17 NOTE — Progress Notes (Signed)
Stopped by to visit with family after healthcare provider advised they might appreciate it, but no one was in rm w/ pt. Chaplain available for f/u.   10/17/16 1100  Clinical Encounter Type  Visited With Patient not available;Other (Comment) (Family not available)  Visit Type Initial;Psychological support;Spiritual support;Social support;Critical [place] Jasmeet Gehl, Chaplain

## 2016-10-17 NOTE — Progress Notes (Signed)
  Echocardiogram 2D Echocardiogram has been performed.  Darlina Sicilian M 10/17/2016, 9:46 AM

## 2016-10-17 NOTE — Progress Notes (Signed)
OT Cancellation Note  Patient Details Name: Ralph Lopez MRN: [id number] DOB: 1950-07-26   Cancelled Treatment:    Reason Eval/Treat Not Completed: Patient not medically ready Bedrest order set intubated and sedated.   Peri Maris  [phone] 10/17/2016, 7:18 AM

## 2016-10-17 NOTE — Procedures (Signed)
Chest Tube Insertion Procedure Note  Indications:  Clinically significant   Pre-operative Diagnosis: rt pnx in setting of fx rt ribs  Post-operative Diagnosis: Same  Procedure Details  Informed consent was obtained for the procedure, including sedation.  Risks of lung perforation, hemorrhage, arrhythmia, and adverse drug reaction were discussed.   After sterile skin prep, using standard technique, a 28 French tube was placed in the rt 6th intercostal space to 18 cm./ Bloody fluid and air rush on tube insertion.  Findings: Chest tube placed.  Estimated Blood Loss:  less than 100 mL         Specimens:  None              Complications:  None; patient tolerated the procedure well.         Disposition: ICU - intubated and hemodynamically stable.         Condition: stable  Richardson Landry Lidiya Reise ACNP Maryanna Shape PCCM Pager [phone] till 3 pm If no answer page [phone] 10/17/2016, 3:42 PM

## 2016-10-17 NOTE — Progress Notes (Signed)
   Daily Progress Note   Assessment/Planning: POD #1 s/p R CCA and SCA TE   Neck incision without complication  Intact perfusion in R arm  Moving L side to commands  Will try to extubate today  Subjective  - 1 Day Post-Op  Intubated patient, responds to instructions  Objective Vitals:   10/17/16 0630 10/17/16 0700 10/17/16 0725 10/17/16 0800  BP: 140/80 135/76  (!) 164/81  Pulse: 70 73  91  Resp: 12 12  (!) 27  Temp:    98.7 F (37.1 C)  TempSrc:    Axillary  SpO2: 100% 100% 100% 100%     Intake/Output Summary (Last 24 hours) at 10/17/16 0928 Last data filed at 10/17/16 0800  Gross per 24 hour  Intake          4183.44 ml  Output             1870 ml  Net          2313.44 ml    PULM  BLL rales CV  RRR NECK R supraclavicular incision c/d/I, mild echymosis, no hematoma NEURO Moves all limbs to instruction  VASC Faintly palpable brachial, non-palpable radial  Laboratory CBC    Component Value Date/Time   WBC 11.4 (H) 10/17/2016 0500   HGB 9.6 (L) 10/17/2016 0500   HGB 12.8 (L) 04/25/2014 0407   HCT 28.9 (L) 10/17/2016 0500   HCT 33.6 (L) 07/07/2016 0950   PLT 244 10/17/2016 0500   PLT 329 07/07/2016 0950    BMET    Component Value Date/Time   NA 144 10/17/2016 0500   NA 141 07/07/2016 0950   NA 139 04/25/2014 0407   K 3.6 10/17/2016 0500   K 3.6 04/25/2014 0407   CL 111 10/17/2016 0500   CL 111 (H) 04/25/2014 0407   CO2 22 10/17/2016 0500   CO2 24 04/25/2014 0407   GLUCOSE 140 (H) 10/17/2016 0500   GLUCOSE 177 (H) 04/25/2014 0407   BUN 16 10/17/2016 0500   BUN 21 07/07/2016 0950   BUN 29 (H) 04/25/2014 0407   CREATININE 1.03 10/17/2016 0500   CREATININE 1.50 (H) 04/25/2014 0407   CALCIUM 9.6 10/17/2016 0500   CALCIUM 8.4 (L) 04/25/2014 0407   GFRNONAA >60 10/17/2016 0500   GFRNONAA 43 (L) 04/24/2014 0052   GFRAA >60 10/17/2016 0500   GFRAA 52 (L) 04/24/2014 0052     Adele Barthel, MD, FACS [facility]  [place] Office: [phone] Pager: [phone]  10/17/2016, 9:28 AM

## 2016-10-18 ENCOUNTER — Inpatient Hospital Stay (HOSPITAL_COMMUNITY): Payer: BLUE CROSS/BLUE SHIELD

## 2016-10-18 DIAGNOSIS — I6300 Cerebral infarction due to thrombosis of unspecified precerebral artery: Secondary | ICD-10-CM

## 2016-10-18 DIAGNOSIS — I639 Cerebral infarction, unspecified: Secondary | ICD-10-CM

## 2016-10-18 LAB — BASIC METABOLIC PANEL
ANION GAP: 8 (ref 5–15)
BUN: 26 mg/dL — ABNORMAL HIGH (ref 6–20)
CALCIUM: 9.2 mg/dL (ref 8.9–10.3)
CHLORIDE: 111 mmol/L (ref 101–111)
CO2: 23 mmol/L (ref 22–32)
CREATININE: 0.89 mg/dL (ref 0.61–1.24)
GFR calc Af Amer: >60 mL/min (ref >60)
GFR calc non Af Amer: >60 mL/min (ref >60)
GLUCOSE: 269 mg/dL — AB (ref 65–99)
Potassium: 3.9 mmol/L (ref 3.5–5.1)
Sodium: 142 mmol/L (ref 135–145)

## 2016-10-18 LAB — MAGNESIUM
Magnesium: 2 mg/dL (ref 1.7–2.4)
Magnesium: 2 mg/dL (ref 1.7–2.4)

## 2016-10-18 LAB — CBC
HEMATOCRIT: 27.9 % — AB (ref 39.0–52.0)
HEMOGLOBIN: 9.1 g/dL — AB (ref 13.0–17.0)
MCH: 28.6 pg (ref 26.0–34.0)
MCHC: 32.6 g/dL (ref 30.0–36.0)
MCV: 87.7 fL (ref 78.0–100.0)
Platelets: 219 10*3/uL (ref 150–400)
RBC: 3.18 MIL/uL — AB (ref 4.22–5.81)
RDW: 14.5 % (ref 11.5–15.5)
WBC: 8.3 10*3/uL (ref 4.0–10.5)

## 2016-10-18 LAB — PHOSPHORUS
PHOSPHORUS: 1.5 mg/dL — AB (ref 2.5–4.6)
Phosphorus: 1.7 mg/dL — ABNORMAL LOW (ref 2.5–4.6)

## 2016-10-18 LAB — GLUCOSE, CAPILLARY
GLUCOSE-CAPILLARY: 288 mg/dL — AB (ref 65–99)
GLUCOSE-CAPILLARY: 266 mg/dL — AB (ref 65–99)
GLUCOSE-CAPILLARY: 252 mg/dL — AB (ref 65–99)
GLUCOSE-CAPILLARY: 305 mg/dL — AB (ref 65–99)
Glucose-Capillary: 280 mg/dL — ABNORMAL HIGH (ref 65–99)
Glucose-Capillary: 285 mg/dL — ABNORMAL HIGH (ref 65–99)

## 2016-10-18 MED ORDER — CYCLOBENZAPRINE HCL 10 MG PO TABS
5.0000 mg | ORAL_TABLET | Freq: Three times a day (TID) | ORAL | Status: DC | PRN
  Administered 2016-10-18 – 2016-10-21 (×4): 5 mg
  Filled 2016-10-18 (×4): qty 1

## 2016-10-18 MED ORDER — DEXTROSE 5 % IV SOLN
20.0000 mmol | Freq: Once | INTRAVENOUS | Status: AC
  Administered 2016-10-18: 20 mmol via INTRAVENOUS
  Filled 2016-10-18: qty 6.67

## 2016-10-18 MED ORDER — INSULIN ASPART 100 UNIT/ML ~~LOC~~ SOLN
0.0000 [IU] | SUBCUTANEOUS | Status: DC
  Administered 2016-10-18 (×2): 8 [IU] via SUBCUTANEOUS
  Administered 2016-10-18: 11 [IU] via SUBCUTANEOUS
  Administered 2016-10-18 – 2016-10-19 (×3): 8 [IU] via SUBCUTANEOUS
  Administered 2016-10-19: 5 [IU] via SUBCUTANEOUS
  Administered 2016-10-19: 11 [IU] via SUBCUTANEOUS
  Administered 2016-10-19: 8 [IU] via SUBCUTANEOUS
  Administered 2016-10-19 – 2016-10-20 (×3): 5 [IU] via SUBCUTANEOUS
  Administered 2016-10-20: 8 [IU] via SUBCUTANEOUS
  Administered 2016-10-20: 5 [IU] via SUBCUTANEOUS
  Administered 2016-10-20: 2 [IU] via SUBCUTANEOUS
  Administered 2016-10-20: 11 [IU] via SUBCUTANEOUS
  Administered 2016-10-20: 5 [IU] via SUBCUTANEOUS
  Administered 2016-10-20 – 2016-10-21 (×2): 11 [IU] via SUBCUTANEOUS
  Administered 2016-10-21: 3 [IU] via SUBCUTANEOUS
  Administered 2016-10-21: 15 [IU] via SUBCUTANEOUS
  Administered 2016-10-21 (×2): 5 [IU] via SUBCUTANEOUS
  Administered 2016-10-22 (×2): 3 [IU] via SUBCUTANEOUS
  Administered 2016-10-22: 8 [IU] via SUBCUTANEOUS
  Administered 2016-10-22: 5 [IU] via SUBCUTANEOUS
  Administered 2016-10-22 (×2): 2 [IU] via SUBCUTANEOUS
  Administered 2016-10-23 (×2): 3 [IU] via SUBCUTANEOUS
  Administered 2016-10-23: 5 [IU] via SUBCUTANEOUS
  Administered 2016-10-24: 2 [IU] via SUBCUTANEOUS

## 2016-10-18 MED ORDER — ORAL CARE MOUTH RINSE
15.0000 mL | OROMUCOSAL | Status: DC
  Administered 2016-10-18 – 2016-10-22 (×42): 15 mL via OROMUCOSAL

## 2016-10-18 NOTE — Progress Notes (Signed)
PULMONARY / CRITICAL CARE MEDICINE   Name: Ralph Lopez MRN: [id number] DOB: 1951-07-05    ADMISSION DATE:  10/15/2016 CONSULTATION DATE:  10/15/2016  REFERRING MD:  Dr. Leonel Ramsay   CHIEF COMPLAINT:  Left MCA S/P revascularization   Brief:   66 year old male with PMH of DM, HTN, HLD, CKD, COPD, Stage 4 Lung CA, and Depression. Presents to ED on 3/28 as a code stroke. Wife reports seeing him at 0300 in his normal state when he got up to go to the bathroom. Later in the morning wife found him on the ground of the kitchen aphasic. 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 Upon arrival to the ED CT revealed a large left MCA. Patient was intubated and then underwent a left common carotid arteriogram followed followed by complete revascularization. PCCM asked to consult for vent management.   Of note patient is being treated with carboplatin and Taxol. Has had radiation therapy with a planned PET soon. However, he has been complaining of hip pain in which he was told was due to a pelvic mass.   SUBJECTIVE:  Weaning, new ct right.   VITAL SIGNS: BP 115/73   Pulse 100   Temp 99.1 F (37.3 C) (Axillary)   Resp 16   Ht '5\' 10"'$  (1.778 m) Comment: recorded 12/17  Wt 73.3 kg (161 lb 9.6 oz)   SpO2 100%   BMI 23.19 kg/m   HEMODYNAMICS:    VENTILATOR SETTINGS: Vent Mode: PRVC FiO2 (%):  [40 %-100 %] 40 % Set Rate:  [14 bmp] 14 bmp Vt Set:  [500 mL] 500 mL PEEP:  [5 cmH20] 5 cmH20 Plateau Pressure:  [16 cmH20-21 cmH20] 20 cmH20  INTAKE / OUTPUT: I/O last 3 completed shifts: In: 3685 [I.V.:3101.5; NG/GT:533.5; IV Piggyback:50] Out: 2180 [Urine:2080; Chest Tube:100]  PHYSICAL EXAMINATION: General:  Adult male, no distress  Neuro:  Awake and follows commands   HEENT:  Et ok  Cardiovascular:  RRR HSR Lungs:  Clear breath sounds, non-labored , decreased bs bases. Rt c t no air leak Abdomen:  +bs Musculoskeletal:  inatct Skin:  WDI  LABS:  BMET  Recent  Labs Lab 10/16/16 0557 10/16/16 1515 10/17/16 0500 10/18/16 0235  NA 140 144 144 142  K 3.6 3.6 3.6 3.9  CL 108  --  111 111  CO2 22  --  22 23  BUN 14  --  16 26*  CREATININE 1.08  --  1.03 0.89  GLUCOSE 129*  --  140* 269*    Electrolytes  Recent Labs Lab 10/16/16 0557 10/17/16 0500 10/17/16 1602 10/18/16 0235  CALCIUM 10.0 9.6  --  9.2  MG  --  1.9 1.8 2.0  PHOS  --  2.8 2.8 1.7*    CBC  Recent Labs Lab 10/16/16 0557 10/16/16 1515 10/17/16 0500 10/18/16 0235  WBC 11.9*  --  11.4* 8.3  HGB 8.4* 6.8* 9.6* 9.1*  HCT 26.3* 20.0* 28.9* 27.9*  PLT 294  --  244 219    Coag's  Recent Labs Lab 10/15/16 1143  APTT 22*  INR 1.09    Sepsis Markers No results for input(s): LATICACIDVEN, PROCALCITON, O2SATVEN in the last 168 hours.  ABG  Recent Labs Lab 10/15/16 1730 10/16/16 1515  PHART 7.424 7.383  PCO2ART 37.3 40.0  PO2ART 142* 502.0*    Liver Enzymes  Recent Labs Lab 10/15/16 1143  AST 18  ALT 8*  ALKPHOS 53  BILITOT 0.5  ALBUMIN 3.0*  Cardiac Enzymes No results for input(s): TROPONINI, PROBNP in the last 168 hours.  Glucose  Recent Labs Lab 10/17/16 1127 10/17/16 1615 10/17/16 2041 10/17/16 2351 10/18/16 0352 10/18/16 0736  GLUCAP 161* 106* 189* 228* 288* 266*    Imaging Ct Chest Wo Contrast  Result Date: 10/17/2016 CLINICAL DATA:  RIGHT lung mass.  intubated, pneumothorax EXAM: CT CHEST WITHOUT CONTRAST TECHNIQUE: Multidetector CT imaging of the chest was performed following the standard protocol without IV contrast. COMPARISON:  CT chest pain radiograph 10/17/2016, PET-CT 05/02/2016 FINDINGS: Cardiovascular: No pericardial fluid.  Mild coronary calcification. Mediastinum/Nodes: No mediastinal lymphadenopathy. Lungs/Pleura: Large mass centered in the RIGHT middle lobe measuring 6.5 cm. This is hypermetabolic on comparison PET-CT scan. There are multiple rounded nodules in the LEFT lower lobe LEFT upper lobe measuring  approximately 2.5 cm each. There are 9 nodules. Single RIGHT upper lobe nodule measuring 3 cm similar. These nodules measure less than 1 cm on comparison PET-CT scan. Endotracheal to in the distal trachea in good position. Moderate size RIGHT pneumothorax. Extensive subcutaneous gas along the RIGHT chest wall. Upper Abdomen: NG tube in stomach Musculoskeletal: Several nondisplaced rib fractures of the RIGHT seventh and eighth ribs. IMPRESSION: 1. Moderate-sized RIGHT pneumothorax as described on radiograph same day. 2. 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 3. Endotracheal tube in good position. 4. RIGHT suprahilar mass. 5. Bilateral round pulmonary metastasis significantly enlarged from prior PET-CT scan. These results will be called to the ordering clinician or representative by the Radiologist Assistant, and communication documented in the PACS or zVision Dashboard. Electronically Signed   By: Suzy Bouchard M.D.   On: 10/17/2016 14:16   Dg Chest Port 1 View  Result Date: 10/18/2016 CLINICAL DATA:  Respiratory failure EXAM: PORTABLE CHEST 1 VIEW COMPARISON:  October 17, 2016 FINDINGS: The ETT remains in good position, terminating 3.2 cm above the carina. The enteric tube courses below today's study, below the diaphragm. The right chest tube remains in place and terminates between the medial fourth and fifth posterior ribs. There is significant subcutaneous air in the right neck in lateral chest wall. A discrete pneumothorax is not identified but evaluation is limited as support apparatus overlies the right apex. A right hilar mass and multiple left greater than right pulmonary nodules are unchanged. Right rib fractures are again identified. No other interval changes. IMPRESSION: 1. Support apparatus remains in place. No definitive right-sided pneumothorax but support apparatus obscures the right apex. 2. Unchanged right hilar mass and lung nodules. 3.  Extensive subcutaneous air in the right neck and lateral chest wall. Electronically Signed   By: Dorise Bullion III M.D   On: 10/18/2016 07:15   Dg Chest Port 1 View  Result Date: 10/17/2016 CLINICAL DATA:  Right pneumothorax. EXAM: PORTABLE CHEST 1 VIEW COMPARISON:  Chest radiograph and CT 10/17/2016 FINDINGS: Endotracheal tube terminates approximately 3.5 cm above the carina. Enteric tube courses towards the left upper abdomen with tip not imaged. A right chest tube has been placed and terminates over the medial posterior fourth rib interspace. There is a small right pneumothorax, decreased in size from today's earlier studies. A right hilar mass and multiple left greater than right lung nodules are again noted. No large pleural effusion is seen. Extensive subcutaneous emphysema remains in the right chest wall extending into the neck. Right rib fractures are noted. IMPRESSION: 1. Interval right chest tube placement with decreased size of right pneumothorax. 2. Unchanged right hilar mass and lung  nodules. Electronically Signed   By: Logan Bores M.D.   On: 10/17/2016 16:04   Dg Chest Port 1 View  Result Date: 10/17/2016 CLINICAL DATA:  Post endotracheal tube placement EXAM: PORTABLE CHEST 1 VIEW COMPARISON:  10/17/2016 FINDINGS: Cardiomediastinal silhouette is stable. Again noted is right hilar mass. Nodular metastasis left lung are stable. Endotracheal tube in place with tip 3.4 cm above the carina. Extensive subcutaneous emphysema right chest wall again noted. Stable small right lateral pneumothorax. Subcutaneous emphysema right supraclavicular region P IMPRESSION: Again noted is right hilar mass. Nodular metastasis left lung are stable. Endotracheal tube in place with tip 3.4 cm above the carina. Extensive subcutaneous emphysema right chest wall again noted. Stable small right lateral pneumothorax. Electronically Signed   By: Lahoma Crocker M.D.   On: 10/17/2016 10:30    STUDIES:  CTA Head/Neck 3/28 >  Thrombus in the distal left M1 Segment to the bifurcation, flow is present in reduced caliber left MCA branch vessels. Thrombus is nonocclusive. Saddle embolus at the innominate artery bifurcation into the right common carotid artery and right subclavian artery.  CT Head 3/28 > Improvement of left MCA following clot retrieval. Remains low -density in the left inferior temporal lobe and insular cortex with edema which has improvement since earlier  MRI Head 3/29 > Left MCA distribution diffusion restriction is present in the left insula, left anterior temporal lobe, and left lateral frontal lobe in distribution similar to CT perfusion CBF less than 30%, there is a small additional area of diffusion restriction within the left parietal lobe.   CULTURES: None.   ANTIBIOTICS: Ancef 3/28 > 3/28  SIGNIFICANT EVENTS: 3/28 > Presents code stroke with Left MCA > Revascularization  3/30 extubated and promptly had resp arrest and reintubation with brief <1 min cpr and noo drugs required and awake post arrest.   LINES/TUBES: ETT 3/28 >>3/30  3/30 re intubated URGENTLY>> 3/30 rt chhedt tube for PNX>>   DISCUSSION: 66 year old male with Stage 4 lung CA presents to ED as code stroke. Imaging revealed Left MCA, patient underwent revascularization and post-op was transferred to ICU intubated. Ready for extubation 3/30. Failed and had resp arrest and reintubated.  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, failed extubation 3/30 H/O COPD, Stage 4 Lung CA with pelvic mass Rt Pnx 3/30 Resp arrest 3/30 post extubation P:   Vent support Wean as tolerated - post thrombectomy secondary to subclavian embolus per vascular. Maintain Saturation >92 Check c x r prior to extubation. Found rt pnx + rib fx this + Lung cancer will impede extubation. Place chest tube 3/30 3/31 no extubation at this time  CARDIOVASCULAR A:  Saddle embolus to right common carotid artery and right  subclavian artery, post interventions H/O HTN, HLD P:  Cardiac Monitoring  ECHO pending  Wean Cleviprex for Systolic Goal [id number] PRN Labetalol  Continue Lipitor and ASA D/C Aline  No anticoagulation per Neuro except ASA. This may not be thrombus accodding to Vascular surgery .  RENAL Lab Results  Component Value Date   CREATININE 0.89 10/18/2016   CREATININE 1.03 10/17/2016   CREATININE 1.08 10/16/2016   CREATININE 1.50 (H) 04/25/2014   CREATININE 1.72 (H) 04/24/2014    Recent Labs Lab 10/16/16 1515 10/17/16 0500 10/18/16 0235  K 3.6 3.6 3.9     A:   CKD (Bas Crt 1.4-1.7) P:   Trend BMP Replace electrolytes as needed Avoid Nephrotoxic medications   GASTROINTESTINAL A:  Dysphagia  GERD P:   Speech Evaluation post-extubation  PPI NPO NGT placed and TF started  HEMATOLOGIC A:   Stage 4 Lung CA with Mets  P:  Trend CBC SCDS  INFECTIOUS A:   No issues  P:   Trend WBC and fever curve   ENDOCRINE CBG (last 3)   Recent Labs  10/17/16 2351 10/18/16 0352 10/18/16 0736  GLUCAP 228* 288* 266*     A:   DM   P:   SSI Q4H glucose checks   NEUROLOGIC A:   Left MCA s/p revascularization  H/O Depression  P:   RASS goal: 0 Per Neurology  Sedate as needed   FAMILY  - Updates: Family updated at bedside . No extubation today  - Inter-disciplinary family meet or Palliative Care meeting due by: 10/22/2016  App CC Time: 35 minutes  Richardson Landry Birdena Kingma ACNP Maryanna Shape PCCM Pager [phone] till 3 pm If no answer page [phone] 10/18/2016, 10:10 AM

## 2016-10-18 NOTE — Progress Notes (Signed)
VASCULAR LAB PRELIMINARY  PRELIMINARY  PRELIMINARY  PRELIMINARY  Right upper extremity arterial duplex completed.    Preliminary report:  There appears to be unobstructed blood flow throughout the right subclavian, axillary, brachial, radial, and ulnar arteries.  Waveforms are within normal limits.  Grason Brailsford, RVT 10/18/2016, 5:15 PM

## 2016-10-18 NOTE — Progress Notes (Signed)
SLP Cancellation Note  Patient Details Name: Ralph Lopez MRN: [id number] DOB: 1951/05/31   Cancelled treatment:       Reason Eval/Treat Not Completed: Patient not medically ready. Pt coded during extubation yesterday. Re-intubated and sedated.   Deneise Lever, [place] CF-SLP Speech-Language Pathologist [phone]  Aliene Altes 10/18/2016, 3:29 PM

## 2016-10-18 NOTE — Progress Notes (Signed)
STROKE TEAM PROGRESS NOTE   SUBJECTIVE (INTERVAL HISTORY)  patient remains intubated but can be easily aroused and follows commands well.. Chest x-ray from this morning does not show any pneumothorax but he has a chest tube in place   OBJECTIVE Temp:  [97.8 F (36.6 C)-99.4 F (37.4 C)] 99.4 F (37.4 C) (03/31 1202) Pulse Rate:  [25-102] 25 (03/31 1400) Cardiac Rhythm: Normal sinus rhythm (03/31 1200) Resp:  [14-24] 24 (03/31 1400) BP: (115-157)/(60-88) 146/82 (03/31 1400) SpO2:  [94 %-100 %] 94 % (03/31 1400) Arterial Line BP: (113-167)/(49-65) 137/59 (03/31 0600) FiO2 (%):  [40 %] 40 % (03/31 1202) Weight:  [161 lb 9.6 oz (73.3 kg)] 161 lb 9.6 oz (73.3 kg) (03/31 0500)   Recent Labs Lab 10/17/16 2041 10/17/16 2351 10/18/16 0352 10/18/16 0736 10/18/16 1140  GLUCAP 189* 228* 288* 266* 280*    Recent Labs Lab 10/15/16 1143 10/15/16 1147 10/16/16 0557 10/16/16 1515 10/17/16 0500 10/17/16 1602 10/18/16 0235  NA 138 139 140 144 144  --  142  K 3.6 3.6 3.6 3.6 3.6  --  3.9  CL 102 101 108  --  111  --  111  CO2 25  --  22  --  22  --  23  GLUCOSE 188* 190* 129*  --  140*  --  269*  BUN '15 17 14  '$ --  16  --  26*  CREATININE 1.59* 1.50* 1.08  --  1.03  --  0.89  CALCIUM 10.4*  --  10.0  --  9.6  --  9.2  MG  --   --   --   --  1.9 1.8 2.0  PHOS  --   --   --   --  2.8 2.8 1.7*    Recent Labs Lab 10/15/16 1143  AST 18  ALT 8*  ALKPHOS 53  BILITOT 0.5  PROT 7.0  ALBUMIN 3.0*    Recent Labs Lab 10/15/16 1143 10/15/16 1147 10/16/16 0557 10/16/16 1515 10/17/16 0500 10/18/16 0235  WBC 11.9*  --  11.9*  --  11.4* 8.3  NEUTROABS 10.0*  --  11.1*  --   --   --   HGB 10.3* 10.5* 8.4* 6.8* 9.6* 9.1*  HCT 32.4* 31.0* 26.3* 20.0* 28.9* 27.9*  MCV 90.3  --  88.6  --  86.8 87.7  PLT 253  --  294  --  244 219   No results for input(s): LABPROT, INR in the last 72 hours.  Recent Labs  10/15/16 1721  COLORURINE STRAW*  LABSPEC 1.027  PHURINE 6.0  GLUCOSEU  NEGATIVE  HGBUR NEGATIVE  BILIRUBINUR NEGATIVE  KETONESUR NEGATIVE  PROTEINUR NEGATIVE  NITRITE NEGATIVE  LEUKOCYTESUR NEGATIVE       Component Value Date/Time   CHOL 112 10/16/2016 0557   CHOL 153 01/02/2016 0952   CHOL 135 07/03/2015 1055   TRIG 140 10/16/2016 0557   TRIG 94 07/03/2015 1055   HDL 25 (L) 10/16/2016 0557   HDL 30 (L) 01/02/2016 0952   CHOLHDL 4.5 10/16/2016 0557   VLDL 28 10/16/2016 0557   VLDL 19 07/03/2015 1055   LDLCALC 59 10/16/2016 0557   LDLCALC 97 01/02/2016 0952   Lab Results  Component Value Date   HGBA1C 5.8 (H) 10/16/2016      Component Value Date/Time   LABOPIA NONE DETECTED 10/15/2016 1720   COCAINSCRNUR NONE DETECTED 10/15/2016 1720   LABBENZ NONE DETECTED 10/15/2016 1720   AMPHETMU NONE DETECTED 10/15/2016 1720  THCU NONE DETECTED 10/15/2016 1720   LABBARB NONE DETECTED 10/15/2016 1720     Recent Labs Lab 10/15/16 1143  Rice <5    I have personally reviewed the radiological images below and agree with the radiology interpretations.   10/10/2016 : Status post endovascular complete revascularization of left MCA occlusion with 1 pass with the Solitaire FR 4 mm x 40 mm retrieval device achieving a TICI 3 reperfusion.  2D Echocardiogram pending  EKG sinus rhythm. For complete results please see formal report.  PHYSICAL EXAM Vitals:   10/18/16 1200 10/18/16 1202 10/18/16 1300 10/18/16 1400  BP: 127/75  133/77 (!) 146/82  Pulse: 91 94 97 (!) 25  Resp: '14 18 20 '$ (!) 24  Temp:  99.4 F (37.4 C)    TempSrc:  Axillary    SpO2: 99% 99% 99% 94%  Weight:      Height:       General: Vital signs reviewed.  Patient is Chronically ill appearing, appears older than stated age, intubated in no acute distress and cooperative with exam.  Cardiovascular: RRR Pulmonary/Chest: Clear to auscultation, no wheezes  Abdominal: Soft, non-tender, non-distended, BS +.  Extremities: No lower extremity edema bilaterally, pulses symmetric and intact  bilaterally.   Neurological Exam : intubated Awake, alert.  . Patient moves all extremities. 4/5 weakness in right upper extremity, 5/5 right lower extremity, 4/5 left lower extremity. 5/5 in left upper extremity. Normal sensation throughout.    Following commands, with some difficultly with peripheral commands. Extraocular muscles intact, PERRLA.no focal weakness.Deep tendon reflexes are symmetric. Plantars are downgoing.  ASSESSMENT/PLAN Ralph Lopez is a 66 y.o. male with history of hypertension, type 2 diabetes, hyperlipidemia, CKD, COPD, stage IV lung cancer undergoing active treatment admitted for achalasia, right sided weakness and facial droop and found to have an acute left MCA CVA status post revascularization.   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   CT Perfusion: The acute infarct involving the left MCA territory with estimated volume of CT a less than 30% at 43 mL. Mismatch volume 131 mL.  CT Angio head/neck: Thrombus in the distal left M1 segment to the bifurcation. Flow is present in reduced caliber left MCA branch vessels. The thrombus is likely nonocclusive. Saddle embolus at the innominate artery bifurcation into the right common carotid artery and right subclavian artery. This embolus is nonocclusive.  CT Head: Improvement in left MCA hypodensity following clot retrieval.   S/p Right CC and R subclavian artery thrombectomy with removal of large gelatinous single embolus with vascular surgery on 3/30- unclear if blood clot or tissue- sent to pathology  Echo: 14-97%, grade 1 diastolic, no evidence of vegetation  LDL 59  HgbA1c 5.8  Heparin for VTE prophylaxis  Diet NPO time specified   aspirin 81 mg daily prior to admission, now on aspirin  325 mg daily  Patient will be counseled to be compliant with his antithrombotic medications  Ongoing aggressive stroke risk factor management  Therapy recommendations:  pending  Disposition:  Pending  Left Common Carotid Thrombus Non-Occlusive s/p Revascularization with IR Right Common Carotid Thrombus and Right Subclavian Thrombus and Stenosis Non-Occlusive  CT Angio head/neck: Thrombus in the distal left M1 segment to the bifurcation. Flow is present in reduced caliber left MCA branch  vessels. The thrombus is likely nonocclusive. Saddle embolus at the innominate artery bifurcation into the right common carotid artery and right subclavian artery. This embolus is nonocclusive.  S/p Right CC and R subclavian artery thrombectomy with removal of large gelatinous single embolus with vascular surgery on 3/30- unclear if blood clot or tissue- sent to pathology  Off anticoagulation  Diabetes  5.8, goal < 7.0  Currently on SSI  CBG monitoring  DM education  Hypertension  Home meds:   Amlodipine 5 mg daily, atenolol 100 mg daily, lisinopril 40 mg daily Keep systolic blood pressure 067-703  Currently on Neo-Synephrine after cardiac arrest  Patient will be counseled to be compliant with his blood pressure medications  Hyperlipidemia  Home meds:  Atorvastatin 10 mg daily  Currently on atorvastatin 10 mg daily  LDL 59, goal < 70  Continue statin at discharge  Other Stroke Risk Factors  History of tobacco abuse  Active malignancy  Other Active Problems  Chronic normocytic anemia-hemoglobin 8.4  Mild leukocytosis of 11.9, afebrile  Cardiac arrest 2/2 to respiratory arrest during extubation- now reintubated, on Neo-Synephrine  Stable small right lateral pneumothorax  Other Pertinent History  Stage IV Lung Cancer: Has been treated with carboplatin and Taxol, given new evidence of metastasis to left hip, plans are to start radiation therapy  Hospital day # 3 Discuss  with critical care PA Ralph Lopez. Wean off ventilatory support and extubate as per PCCM This patient is critically ill and at significant risk of neurological worsening, death and care requires constant monitoring of vital signs, hemodynamics,respiratory and cardiac monitoring, extensive review of multiple databases, frequent neurological assessment, discussion with family, other specialists and medical decision making of high complexity.I have made any additions or clarifications directly to the above note.This critical care time does not reflect procedure time, or teaching time or supervisory time of PA/NP/Med Resident etc but could involve care discussion time.  I spent 30 minutes of neurocritical care time  in the care of  this patient.     Antony Contras, MD 10/18/2016 2:55 PM  To contact Stroke Continuity provider, please refer to [email or web address] After hours, contact General Neurology

## 2016-10-18 NOTE — Progress Notes (Signed)
   Daily Progress Note   Assessment/Planning: POD #2 s/p R CCA and SCA TE   Neck without any complication  Asleep today so limited exam  Still intubated  Weakly palpable R brachial artery  Subjective  - 2 Days Post-Op  Intubated, asleep  Objective Vitals:   10/18/16 0600 10/18/16 0700 10/18/16 0800 10/18/16 0828  BP: 126/83 131/79 129/80 137/84  Pulse: 96 94 89 98  Resp: '17 16 14 18  '$ Temp:   99.1 F (37.3 C)   TempSrc:   Axillary   SpO2: 100% 100% 100% 100%  Weight:      Height:         Intake/Output Summary (Last 24 hours) at 10/18/16 0917 Last data filed at 10/18/16 0900  Gross per 24 hour  Intake          2397.26 ml  Output             1135 ml  Net          1262.26 ml   CHEST Supraclavicular incision: Mild echymosis, no hematoma VASC  Warm R hand, CR < 2 sec, faintly palpable brachial pulse, no palpable radial pulse  Laboratory CBC    Component Value Date/Time   WBC 8.3 10/18/2016 0235   HGB 9.1 (L) 10/18/2016 0235   HGB 12.8 (L) 04/25/2014 0407   HCT 27.9 (L) 10/18/2016 0235   HCT 33.6 (L) 07/07/2016 0950   PLT 219 10/18/2016 0235   PLT 329 07/07/2016 0950    BMET    Component Value Date/Time   NA 142 10/18/2016 0235   NA 141 07/07/2016 0950   NA 139 04/25/2014 0407   K 3.9 10/18/2016 0235   K 3.6 04/25/2014 0407   CL 111 10/18/2016 0235   CL 111 (H) 04/25/2014 0407   CO2 23 10/18/2016 0235   CO2 24 04/25/2014 0407   GLUCOSE 269 (H) 10/18/2016 0235   GLUCOSE 177 (H) 04/25/2014 0407   BUN 26 (H) 10/18/2016 0235   BUN 21 07/07/2016 0950   BUN 29 (H) 04/25/2014 0407   CREATININE 0.89 10/18/2016 0235   CREATININE 1.50 (H) 04/25/2014 0407   CALCIUM 9.2 10/18/2016 0235   CALCIUM 8.4 (L) 04/25/2014 0407   GFRNONAA >60 10/18/2016 0235   GFRNONAA 43 (L) 04/24/2014 0052   GFRAA >60 10/18/2016 0235   GFRAA 52 (L) 04/24/2014 0052     Adele Barthel, MD, FACS [facility] Office: [phone] Pager:  [phone]  10/18/2016, 9:17 AM

## 2016-10-18 NOTE — Progress Notes (Signed)
eLink Physician-Brief Progress Note Patient Name: Ralph Lopez DOB: Jul 18, 1951 MRN: [id number]   Date of Service  10/18/2016  HPI/Events of Note  Multiple issues: 1. Patient c/o cramps in bilateral calves and 2. K+ = 3.9, PO4--- = 1.7 and Creatinine = 0.89.  eICU Interventions  Will order: 1. Flexeril 5 mg per tube Q 8 hours PRN muscle cramps.  2. Replace K+ and PO4---.     Intervention Category Major Interventions: Electrolyte abnormality - evaluation and management;Other:  Lysle Dingwall 10/18/2016, 4:36 PM

## 2016-10-19 ENCOUNTER — Inpatient Hospital Stay (HOSPITAL_COMMUNITY): Payer: BLUE CROSS/BLUE SHIELD

## 2016-10-19 LAB — BASIC METABOLIC PANEL
Anion gap: 8 (ref 5–15)
BUN: 25 mg/dL — AB (ref 6–20)
CALCIUM: 8.9 mg/dL (ref 8.9–10.3)
CO2: 25 mmol/L (ref 22–32)
CREATININE: 0.84 mg/dL (ref 0.61–1.24)
Chloride: 108 mmol/L (ref 101–111)
GFR calc Af Amer: >60 mL/min (ref >60)
GLUCOSE: 308 mg/dL — AB (ref 65–99)
Potassium: 3.7 mmol/L (ref 3.5–5.1)
Sodium: 141 mmol/L (ref 135–145)

## 2016-10-19 LAB — CBC
HCT: 27.3 % — ABNORMAL LOW (ref 39.0–52.0)
Hemoglobin: 8.9 g/dL — ABNORMAL LOW (ref 13.0–17.0)
MCH: 28.9 pg (ref 26.0–34.0)
MCHC: 32.6 g/dL (ref 30.0–36.0)
MCV: 88.6 fL (ref 78.0–100.0)
PLATELETS: 231 10*3/uL (ref 150–400)
RBC: 3.08 MIL/uL — ABNORMAL LOW (ref 4.22–5.81)
RDW: 14.6 % (ref 11.5–15.5)
WBC: 8.3 10*3/uL (ref 4.0–10.5)

## 2016-10-19 LAB — GLUCOSE, CAPILLARY
GLUCOSE-CAPILLARY: 227 mg/dL — AB (ref 65–99)
GLUCOSE-CAPILLARY: 218 mg/dL — AB (ref 65–99)
GLUCOSE-CAPILLARY: 249 mg/dL — AB (ref 65–99)
Glucose-Capillary: 265 mg/dL — ABNORMAL HIGH (ref 65–99)
Glucose-Capillary: 303 mg/dL — ABNORMAL HIGH (ref 65–99)

## 2016-10-19 LAB — PHOSPHORUS: Phosphorus: 2 mg/dL — ABNORMAL LOW (ref 2.5–4.6)

## 2016-10-19 LAB — MAGNESIUM: Magnesium: 1.9 mg/dL (ref 1.7–2.4)

## 2016-10-19 LAB — TRIGLYCERIDES: Triglycerides: 135 mg/dL (ref <150)

## 2016-10-19 MED ORDER — POTASSIUM CHLORIDE 20 MEQ/15ML (10%) PO SOLN
40.0000 meq | Freq: Once | ORAL | Status: AC
  Administered 2016-10-19: 40 meq
  Filled 2016-10-19: qty 30

## 2016-10-19 MED ORDER — DOCUSATE SODIUM 50 MG/5ML PO LIQD: 200.0000 mg | Freq: Once | ORAL | Status: DC

## 2016-10-19 MED ORDER — DOCUSATE SODIUM 50 MG/5ML PO LIQD
100.0000 mg | Freq: Two times a day (BID) | ORAL | Status: DC | PRN
  Administered 2016-10-20: 100 mg
  Filled 2016-10-19: qty 10

## 2016-10-19 MED ORDER — PROPOFOL 1000 MG/100ML IV EMUL
5.0000 ug/kg/min | INTRAVENOUS | Status: DC
  Administered 2016-10-19 – 2016-10-21 (×4): 25 ug/kg/min via INTRAVENOUS
  Filled 2016-10-19 (×6): qty 100

## 2016-10-19 NOTE — Progress Notes (Signed)
PULMONARY / CRITICAL CARE MEDICINE   Name: Ralph Lopez MRN: [id number] DOB: [birth year]    ADMISSION DATE:  10/15/2016 CONSULTATION DATE:  10/15/2016  REFERRING MD:  Dr. Leonel Ramsay   CHIEF COMPLAINT:  Left MCA S/P revascularization   Brief:   66 year old male with PMH of DM, HTN, HLD, CKD, COPD, Stage 4 Lung CA, and Depression. Presents to ED on 3/28 as a code stroke. Wife reports seeing him at 0300 in his normal state when he got up to go to the bathroom. Later in the morning wife found him on the ground of the kitchen aphasic. 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 Upon arrival to the ED CT revealed a large left MCA. Patient was intubated and then underwent a left common carotid arteriogram followed followed by complete revascularization. PCCM asked to consult for vent management.   Of note patient is being treated with carboplatin and Taxol. Has had radiation therapy with a planned PET soon. However, he has been complaining of hip pain in which he was told was due to a pelvic mass.     STUDIES:  CTA Head/Neck 3/28 > Thrombus in the distal left M1 Segment to the bifurcation, flow is present in reduced caliber left MCA branch vessels. Thrombus is nonocclusive. Saddle embolus at the innominate artery bifurcation into the right common carotid artery and right subclavian artery.  CT Head 3/28 > Improvement of left MCA following clot retrieval. Remains low -density in the left inferior temporal lobe and insular cortex with edema which has improvement since earlier  MRI Head 3/29 > Left MCA distribution diffusion restriction is present in the left insula, left anterior temporal lobe, and left lateral frontal lobe in distribution similar to CT perfusion CBF less than 30%, there is a small additional area of diffusion restriction within the left parietal lobe.   CULTURES: None.   ANTIBIOTICS: Ancef 3/28 > 3/28  SIGNIFICANT EVENTS: 3/28 > Presents code stroke with  Left MCA > Revascularization  3/30 extubated and promptly had resp arrest and reintubation with brief <1 min cpr and noo drugs required and awake post arrest.   LINES/TUBES: ETT 3/28 >>3/30  3/30 re intubated URGENTLY>> 3/30 rt chhedt tube for PNX>>      SUBJECTIVE/OVERNIGHT/INTERVAL HX 10/19/16 - ready for SBT. RN/APP concernd about possible post extubation cardia arrest risk similar to 2 d ago. CT chest at tthat time shows rt supr hilar mass compressng RML and RLL airwaya (still present) and RT pneumothorax; now resolved. Currently he is on low dose diprivan and watching tV and feels ready for extubatin.   VITAL SIGNS: BP 138/82   Pulse 90   Temp 99.4 F (37.4 C) (Axillary)   Resp 17   Ht '5\' 10"'$  (1.778 m) Comment: recorded 12/17  Wt 75.7 kg (166 lb 14.2 oz)   SpO2 100%   BMI 23.95 kg/m   HEMODYNAMICS:    VENTILATOR SETTINGS: Vent Mode: PRVC FiO2 (%):  [40 %] 40 % Set Rate:  [14 bmp] 14 bmp Vt Set:  [500 mL] 500 mL PEEP:  [5 cmH20] 5 cmH20 Pressure Support:  [8 cmH20] 8 cmH20 Plateau Pressure:  [17 cmH20-20 cmH20] 17 cmH20  INTAKE / OUTPUT: I/O last 3 completed shifts: In: 5093.7 [I.V.:2772; NW/GN:5621; IV Piggyback:506.7] Out: 2861 [Urine:2685; Chest Tube:176]  PHYSICAL EXAMINATION:  General Appearance:    Looks better  Head:    Normocephalic, without obvious abnormality, atraumatic  Eyes:    PERRL - yes, conjunctiva/corneas -  clear      Ears:    Normal external ear canals, both ears  Nose:   NG tube - no  Throat:  ETT TUBE - yes , OG tube - yes  Neck:   Supple,  No enlargement/tenderness/nodules     Lungs:     Clear to auscultation bilaterally except on right some crackles, Ventilator   Synchrony - yes  Chest wall:    No deformity but mild sub cut air ritght base  Heart:    S1 and S2 normal, no murmur, CVP - no.  Pressors - no  Abdomen:     Soft, no masses, no organomegaly  Genitalia:    Not done  Rectal:   not done  Extremities:   Extremities- intact      Skin:   Intact in exposed areas .     Neurologic:   Sedation - diprivan gtt -> RASS - +1 . Moves all 4s - yes. CAM-ICU - neg for delirium . Orientation - x 3 +      LABS: PULMONARY  Recent Labs Lab 10/15/16 1147 10/15/16 1730 10/16/16 1515  PHART  --  7.424 7.383  PCO2ART  --  37.3 40.0  PO2ART  --  142* 502.0*  HCO3  --  23.9 24.2  TCO2 27  --  25  O2SAT  --  98.7 100.0    CBC  Recent Labs Lab 10/17/16 0500 10/18/16 0235 10/19/16 0455  HGB 9.6* 9.1* 8.9*  HCT 28.9* 27.9* 27.3*  WBC 11.4* 8.3 8.3  PLT 244 219 231    COAGULATION  Recent Labs Lab 10/15/16 1143  INR 1.09    CARDIAC  No results for input(s): TROPONINI in the last 168 hours. No results for input(s): PROBNP in the last 168 hours.   CHEMISTRY  Recent Labs Lab 10/15/16 1143 10/15/16 1147 10/16/16 0557 10/16/16 1515 10/17/16 0500 10/17/16 1602 10/18/16 0235 10/18/16 1826 10/19/16 0455  NA 138 139 140 144 144  --  142  --  141  K 3.6 3.6 3.6 3.6 3.6  --  3.9  --  3.7  CL 102 101 108  --  111  --  111  --  108  CO2 25  --  22  --  22  --  23  --  25  GLUCOSE 188* 190* 129*  --  140*  --  269*  --  308*  BUN '15 17 14  '$ --  16  --  26*  --  25*  CREATININE 1.59* 1.50* 1.08  --  1.03  --  0.89  --  0.84  CALCIUM 10.4*  --  10.0  --  9.6  --  9.2  --  8.9  MG  --   --   --   --  1.9 1.8 2.0 2.0  --   PHOS  --   --   --   --  2.8 2.8 1.7* 1.5*  --    Estimated Creatinine Clearance: 85.4 mL/min (by C-G formula based on SCr of 0.89 mg/dL).   LIVER  Recent Labs Lab 10/15/16 1143  AST 18  ALT 8*  ALKPHOS 53  BILITOT 0.5  PROT 7.0  ALBUMIN 3.0*  INR 1.09     INFECTIOUS No results for input(s): LATICACIDVEN, PROCALCITON in the last 168 hours.   ENDOCRINE CBG (last 3)   Recent Labs  10/18/16 1956 10/18/16 2331 10/19/16 0330  GLUCAP 305* 285* 265*  IMAGING x48h  - image(s) personally visualized  -   highlighted in bold Ct Chest Wo Contrast  Result Date:  10/17/2016 CLINICAL DATA:  RIGHT lung mass.  intubated, pneumothorax EXAM: CT CHEST WITHOUT CONTRAST TECHNIQUE: Multidetector CT imaging of the chest was performed following the standard protocol without IV contrast. COMPARISON:  CT chest pain radiograph 10/17/2016, PET-CT 05/02/2016 FINDINGS: Cardiovascular: No pericardial fluid.  Mild coronary calcification. Mediastinum/Nodes: No mediastinal lymphadenopathy. Lungs/Pleura: Large mass centered in the RIGHT middle lobe measuring 6.5 cm. This is hypermetabolic on comparison PET-CT scan. There are multiple rounded nodules in the LEFT lower lobe LEFT upper lobe measuring approximately 2.5 cm each. There are 9 nodules. Single RIGHT upper lobe nodule measuring 3 cm similar. These nodules measure less than 1 cm on comparison PET-CT scan. Endotracheal to in the distal trachea in good position. Moderate size RIGHT pneumothorax. Extensive subcutaneous gas along the RIGHT chest wall. Upper Abdomen: NG tube in stomach Musculoskeletal: Several nondisplaced rib fractures of the RIGHT seventh and eighth ribs. IMPRESSION: 1. Moderate-sized RIGHT pneumothorax as described on radiograph same day. 2. 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 3. Endotracheal tube in good position. 4. RIGHT suprahilar mass. 5. Bilateral round pulmonary metastasis significantly enlarged from prior PET-CT scan. These results will be called to the ordering clinician or representative by the Radiologist Assistant, and communication documented in the PACS or zVision Dashboard. Electronically Signed   By: Suzy Bouchard M.D.   On: 10/17/2016 14:16   Dg Chest Port 1 View  Result Date: 10/19/2016 CLINICAL DATA:  Respiratory failure.  Cancer. EXAM: PORTABLE CHEST 1 VIEW COMPARISON:  10/17/2016, 10/18/2016 FINDINGS: Right chest tube remains in place. No pneumothorax identified on the right. Decrease in chest wall gas on the right. Large mass lesion in the  right perihilar region. Multiple lung nodules on the left compatible with metastatic cancer. Mild bibasilar atelectasis/ infiltrate with mild progression in the interval. Endotracheal tube in good position. NG tube in place entering the stomach. IMPRESSION: Right chest tube remains in place.  No pneumothorax Mild Progression of bibasilar atelectasis/ infiltrate Endotracheal tube remains in good position. Electronically Signed   By: Franchot Gallo M.D.   On: 10/19/2016 07:14   Dg Chest Port 1 View  Result Date: 10/18/2016 CLINICAL DATA:  Respiratory failure EXAM: PORTABLE CHEST 1 VIEW COMPARISON:  October 17, 2016 FINDINGS: The ETT remains in good position, terminating 3.2 cm above the carina. The enteric tube courses below today's study, below the diaphragm. The right chest tube remains in place and terminates between the medial fourth and fifth posterior ribs. There is significant subcutaneous air in the right neck in lateral chest wall. A discrete pneumothorax is not identified but evaluation is limited as support apparatus overlies the right apex. A right hilar mass and multiple left greater than right pulmonary nodules are unchanged. Right rib fractures are again identified. No other interval changes. IMPRESSION: 1. Support apparatus remains in place. No definitive right-sided pneumothorax but support apparatus obscures the right apex. 2. Unchanged right hilar mass and lung nodules. 3. Extensive subcutaneous air in the right neck and lateral chest wall. Electronically Signed   By: Dorise Bullion III M.D   On: 10/18/2016 07:15   Dg Chest Port 1 View  Result Date: 10/17/2016 CLINICAL DATA:  Right pneumothorax. EXAM: PORTABLE CHEST 1 VIEW COMPARISON:  Chest radiograph and CT 10/17/2016 FINDINGS: Endotracheal tube terminates approximately 3.5 cm above the carina. Enteric tube courses towards the  left upper abdomen with tip not imaged. A right chest tube has been placed and terminates over the medial posterior  fourth rib interspace. There is a small right pneumothorax, decreased in size from today's earlier studies. A right hilar mass and multiple left greater than right lung nodules are again noted. No large pleural effusion is seen. Extensive subcutaneous emphysema remains in the right chest wall extending into the neck. Right rib fractures are noted. IMPRESSION: 1. Interval right chest tube placement with decreased size of right pneumothorax. 2. Unchanged right hilar mass and lung nodules. Electronically Signed   By: Logan Bores M.D.   On: 10/17/2016 16:04   Dg Chest Port 1 View  Result Date: 10/17/2016 CLINICAL DATA:  Post endotracheal tube placement EXAM: PORTABLE CHEST 1 VIEW COMPARISON:  10/17/2016 FINDINGS: Cardiomediastinal silhouette is stable. Again noted is right hilar mass. Nodular metastasis left lung are stable. Endotracheal tube in place with tip 3.4 cm above the carina. Extensive subcutaneous emphysema right chest wall again noted. Stable small right lateral pneumothorax. Subcutaneous emphysema right supraclavicular region P IMPRESSION: Again noted is right hilar mass. Nodular metastasis left lung are stable. Endotracheal tube in place with tip 3.4 cm above the carina. Extensive subcutaneous emphysema right chest wall again noted. Stable small right lateral pneumothorax. Electronically Signed   By: Lahoma Crocker M.D.   On: 10/17/2016 10:30   Dg Chest Port 1 View  Result Date: 10/17/2016 CLINICAL DATA:  Intubation. EXAM: PORTABLE CHEST 1 VIEW COMPARISON:  Radiograph of October 16, 2016. FINDINGS: Endotracheal tube is approximately 5 cm above the carina in grossly good position. Large right hilar mass in left pulmonary lesions are noted consistent with metastatic disease. There is interval development of mild right basilar pneumothorax with subcutaneous emphysema seen over the right lateral chest wall. Right rib fractures are again noted. IMPRESSION: Endotracheal tube in grossly good position. Stable  large right hilar mass and multiple left pulmonary nodules are noted concerning for metastatic disease. Interval development of mild right basilar pneumothorax with associated subcutaneous emphysema overlying right lateral chest wall. Right rib fractures are again noted. Critical Value/emergent results were called by telephone at the time of interpretation on 10/17/2016 at 9:03 am to Dr. Asa Saunas , who verbally acknowledged these results. Electronically Signed   By: Marijo Conception, M.D.   On: 10/17/2016 09:03     Imaging Dg Chest Port 1 View  Result Date: 10/19/2016 CLINICAL DATA:  Respiratory failure.  Cancer. EXAM: PORTABLE CHEST 1 VIEW COMPARISON:  10/17/2016, 10/18/2016 FINDINGS: Right chest tube remains in place. No pneumothorax identified on the right. Decrease in chest wall gas on the right. Large mass lesion in the right perihilar region. Multiple lung nodules on the left compatible with metastatic cancer. Mild bibasilar atelectasis/ infiltrate with mild progression in the interval. Endotracheal tube in good position. NG tube in place entering the stomach. IMPRESSION: Right chest tube remains in place.  No pneumothorax Mild Progression of bibasilar atelectasis/ infiltrate Endotracheal tube remains in good position. Electronically Signed   By: Franchot Gallo M.D.   On: 10/19/2016 07:14   DISCUSSION: 66 year old male with Stage 4 lung CA presents to ED as code stroke. Imaging revealed Left MCA, patient underwent revascularization and post-op was transferred to ICU intubated. Ready for extubation 3/30. Failed and had resp arrest and reintubated.  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, failed extubation 3/30 H/O COPD, Stage 4 Lung CA with pelvic mass Rt Pnx  3/30 and s/p chest tube Resp arrest 3/30 post extubation   10/19/16 - meets sbt criteria. Ptx now resolved on right with chest tube./ However, rt LL mass compressing on RLL/RML and advanced lung cancer  and low phos put him at risk for failed extubation  P:   SBT and if extubating -> BiPAP after family agreement Wean as tolerated - post thrombectomy secondary to subclavian embolus per vascular.   CARDIOVASCULAR  No results for input(s): TROPONINI in the last 168 hours.  A:  Saddle embolus to right common carotid artery and right subclavian artery, post interventions H/O HTN, HLD  4/1 - echo 3/30 with good ef. Off cleviprex P:  Cardiac Monitoring  PRN Labetalol  Continue Lipitor and ASA D/C Aline  No anticoagulation per Neuro except ASA. This may not be thrombus accodding to Vascular surgery .  RENAL Lab Results  Component Value Date   CREATININE 0.84 10/19/2016   CREATININE 0.89 10/18/2016   CREATININE 1.03 10/17/2016   CREATININE 1.50 (H) 04/25/2014   CREATININE 1.72 (H) 04/24/2014    Recent Labs Lab 10/17/16 0500 10/18/16 0235 10/19/16 0455  K 3.6 3.9 3.7     A:   CKD (Bas Crt 1.4-1.7)  4/1- resolve AKI since 10/17/16 but having low phos issues that can affect wean ability P:   Check phos stat 10/19/2016 Trend BMP Check mag and phos Replace electrolytes as needed Avoid Nephrotoxic medications   GASTROINTESTINAL A:   Dysphagia  GERD P:   Speech Evaluation post-extubation  PPI NPO NGT placed and TF started; h old TF for piossible extubation  HEMATOLOGIC  Recent Labs Lab 10/17/16 0500 10/18/16 0235 10/19/16 0455  HGB 9.6* 9.1* 8.9*  HCT 28.9* 27.9* 27.3*  WBC 11.4* 8.3 8.3  PLT 244 219 231    A:   Stage 4 Lung CA with Mets  P:  Trend CBC SCDS PRBC for hgb < 7gm%   INFECTIOUS A:   No issues  P:   Trend WBC and fever curve   ENDOCRINE CBG (last 3)   Recent Labs  10/18/16 1956 10/18/16 2331 10/19/16 0330  GLUCAP 305* 285* 265*     A:   DM   P:   SSI Q4H glucose checks   NEUROLOGIC A:   Left MCA s/p revascularization  H/O Depression    - 4/1/ =- normal WUA on diprivan P:   DC diprivvan gtt f RASS goal: 0 Per  Neurology  Sedate as needed   FAMILY  - Updates: Family updated at bedside . No extubation 3/31 but possible for 10/19/16  - Inter-disciplinary family meet or Palliative Care meeting due by: 10/22/2016  GLOBAL Extubate to bipap if meets criteira and ensuring phos is normal; but after family and patient consensus about risk     The patient is critically ill with multiple organ systems failure and requires high complexity decision making for assessment and support, frequent evaluation and titration of therapies, application of advanced monitoring technologies and extensive interpretation of multiple databases.   Critical Care Time devoted to patient care services described in this note is  30  Minutes. This time reflects time of care of this signee Dr Brand Males. This critical care time does not reflect procedure time, or teaching time or supervisory time of PA/NP/Med student/Med Resident etc but could involve care discussion time    Dr. Brand Males, M.D., [facility].C.P Pulmonary and Critical Care Medicine Staff Physician [facility] Pager: [phone]  [phone], If no answer or between  15:00h - 7:00h: call [phone]  10/19/2016 7:55 AM

## 2016-10-19 NOTE — Progress Notes (Signed)
Pt's respiratory rate increased to 40 bpm while weaning without sedation. RT increased PS above PEEP to 10 and fentanyl given to increase pt's comfort. Dr. Chase Caller of PCCM notified and order received to restart the pt's propofol and switch ventilator back to full support. Pt's respiratory status improved with rate of 16 and HR of 96.

## 2016-10-19 NOTE — Progress Notes (Signed)
STROKE TEAM PROGRESS NOTE   SUBJECTIVE (INTERVAL HISTORY) No family members present. The patient is intubated but alert and follows commands. Dr. Leonie Man believes the patient will do well from a stroke standpoint. He will require anticoagulation in the future.  OBJECTIVE Temp:  [97.7 F (36.5 C)-99.4 F (37.4 C)] 99.1 F (37.3 C) (04/01 1300) Pulse Rate:  [25-116] 93 (04/01 1300) Cardiac Rhythm: Normal sinus rhythm (04/01 1300) Resp:  [15-29] 15 (04/01 1300) BP: (131-159)/(72-92) 142/76 (04/01 1300) SpO2:  [85 %-100 %] 100 % (04/01 1300) FiO2 (%):  [40 %] 40 % (04/01 1300) Weight:  [75.7 kg (166 lb 14.2 oz)] 75.7 kg (166 lb 14.2 oz) (04/01 0500)   Recent Labs Lab 10/18/16 1539 10/18/16 1956 10/18/16 2331 10/19/16 0330 10/19/16 0757  GLUCAP 252* 305* 285* 265* 303*    Recent Labs Lab 10/15/16 1143 10/15/16 1147 10/16/16 0557 10/16/16 1515 10/17/16 0500 10/17/16 1602 10/18/16 0235 10/18/16 1826 10/19/16 0455 10/19/16 0700  NA 138 139 140 144 144  --  142  --  141  --   K 3.6 3.6 3.6 3.6 3.6  --  3.9  --  3.7  --   CL 102 101 108  --  111  --  111  --  108  --   CO2 25  --  22  --  22  --  23  --  25  --   GLUCOSE 188* 190* 129*  --  140*  --  269*  --  308*  --   BUN '15 17 14  '$ --  16  --  26*  --  25*  --   CREATININE 1.59* 1.50* 1.08  --  1.03  --  0.89  --  0.84  --   CALCIUM 10.4*  --  10.0  --  9.6  --  9.2  --  8.9  --   MG  --   --   --   --  1.9 1.8 2.0 2.0  --  1.9  PHOS  --   --   --   --  2.8 2.8 1.7* 1.5*  --  2.0*    Recent Labs Lab 10/15/16 1143  AST 18  ALT 8*  ALKPHOS 53  BILITOT 0.5  PROT 7.0  ALBUMIN 3.0*    Recent Labs Lab 10/15/16 1143  10/16/16 0557 10/16/16 1515 10/17/16 0500 10/18/16 0235 10/19/16 0455  WBC 11.9*  --  11.9*  --  11.4* 8.3 8.3  NEUTROABS 10.0*  --  11.1*  --   --   --   --   HGB 10.3*  < > 8.4* 6.8* 9.6* 9.1* 8.9*  HCT 32.4*  < > 26.3* 20.0* 28.9* 27.9* 27.3*  MCV 90.3  --  88.6  --  86.8 87.7 88.6  PLT 253   --  294  --  244 219 231  < > = values in this interval not displayed. No results for input(s): LABPROT, INR in the last 72 hours. No results for input(s): COLORURINE, LABSPEC, [place], GLUCOSEU, HGBUR, BILIRUBINUR, KETONESUR, PROTEINUR, UROBILINOGEN, NITRITE, LEUKOCYTESUR in the last 72 hours.  Invalid input(s): APPERANCEUR     Component Value Date/Time   CHOL 112 10/16/2016 0557   CHOL 153 01/02/2016 0952   CHOL 135 07/03/2015 1055   TRIG 140 10/16/2016 0557   TRIG 94 07/03/2015 1055   HDL 25 (L) 10/16/2016 0557   HDL 30 (L) 01/02/2016 0952   CHOLHDL 4.5 10/16/2016 0557   VLDL  28 10/16/2016 0557   VLDL 19 07/03/2015 1055   LDLCALC 59 10/16/2016 0557   LDLCALC 97 01/02/2016 0952   Lab Results  Component Value Date   HGBA1C 5.8 (H) 10/16/2016      Component Value Date/Time   LABOPIA NONE DETECTED 10/15/2016 1720   COCAINSCRNUR NONE DETECTED 10/15/2016 1720   LABBENZ NONE DETECTED 10/15/2016 1720   AMPHETMU NONE DETECTED 10/15/2016 1720   THCU NONE DETECTED 10/15/2016 1720   LABBARB NONE DETECTED 10/15/2016 1720     Recent Labs Lab 10/15/16 [place] <5    I have personally reviewed the radiological images below and agree with the radiology interpretations.   10/10/2016 : Status post endovascular complete revascularization of left MCA occlusion with 1 pass with the Solitaire FR 4 mm x 40 mm retrieval device achieving a TICI 3 reperfusion.  2D Echocardiogram  10/17/2016 Study Conclusions - Left ventricle: The cavity size was normal. Systolic function was   vigorous. The estimated ejection fraction was in the range of 65%   to 70%. Wall motion was normal; there were no regional wall   motion abnormalities. Doppler parameters are consistent with   abnormal left ventricular relaxation (grade 1 diastolic   dysfunction). - Aortic valve: Trileaflet; normal thickness leaflets. There was no   regurgitation. - Aortic root: The aortic root was normal in size. - Mitral  valve: Structurally normal valve. - Left atrium: The atrium was normal in size. - Right ventricle: Systolic function was normal. - Tricuspid valve: There was no regurgitation. - Pulmonary arteries: Systolic pressure could not be accurately   estimated. - Inferior vena cava: The vessel was normal in size. Impressions: - Impaired relaxation with normal filling pressures, otherwise   normal study.   EKG sinus rhythm. For complete results please see formal report.  PHYSICAL EXAM Vitals:   10/19/16 1100 10/19/16 1200 10/19/16 1210 10/19/16 1300  BP: (!) 149/86 137/85 137/85 (!) 142/76  Pulse: (!) 106 100 100 93  Resp: (!) '21 15 16 15  '$ Temp:    99.1 F (37.3 C)  TempSrc:    Axillary  SpO2: 99% 100% 100% 100%  Weight:      Height:       General: Vital signs reviewed.  Patient is Chronically ill appearing, appears older than stated age, intubated in no acute distress and cooperative with exam.  Cardiovascular: RRR Pulmonary/Chest: Clear to auscultation, no wheezes  Abdominal: Soft, non-tender, non-distended, BS +.  Extremities: No lower extremity edema bilaterally, pulses symmetric and intact bilaterally.   Neurological Exam : intubated Awake, alert.  . Patient moves all extremities. 4/5 weakness in right upper extremity, 5/5 right lower extremity, 4/5 left lower extremity. 5/5 in left upper extremity. Normal sensation throughout.    Following commands, with some difficultly with peripheral commands. Extraocular muscles intact, PERRLA.no focal weakness.Deep tendon reflexes are symmetric. Plantars are downgoing.  ASSESSMENT/PLAN Mr. VICKEY EWBANK is a 66 y.o. male with history of hypertension, type 2 diabetes, hyperlipidemia, CKD, COPD, stage IV lung cancer undergoing active treatment admitted for achalasia, right sided weakness and facial droop and found to have an acute left MCA CVA status post revascularization.   Acute left MCA Stroke with L CC thrombus s/p revascularization:     MRI: Left MCA distribution diffusion restriction is present in the left insula, left anterior temporal lobe, and left lateral frontal lobe in distribution similar to CT perfusion CBF less than 30%.   CT Perfusion: The acute infarct involving the left  MCA territory with estimated volume of CT a less than 30% at 43 mL. Mismatch volume 131 mL.  CT Angio head/neck: Thrombus in the distal left M1 segment to the bifurcation. Flow is present in reduced caliber left MCA branch vessels. The thrombus is likely nonocclusive. Saddle embolus at the innominate artery bifurcation into the right common carotid artery and right subclavian artery. This embolus is nonocclusive.  CT Head: Improvement in left MCA hypodensity following clot retrieval.   S/p Right CC and R subclavian artery thrombectomy with removal of large gelatinous single embolus with vascular surgery on 3/30- unclear if blood clot or tissue- sent to pathology  Echo: 10-27%, grade 1 diastolic, no evidence of vegetation  LDL 59  HgbA1c 5.8  Heparin for VTE prophylaxis  Diet NPO time specified   aspirin 81 mg daily prior to admission, now on aspirin 325 mg daily  Patient will be counseled to be compliant with his antithrombotic medications  Ongoing aggressive stroke risk factor management  Therapy recommendations:  pending  Disposition:  Pending  Left Common Carotid Thrombus Non-Occlusive s/p Revascularization with IR Right Common Carotid Thrombus and Right Subclavian Thrombus and Stenosis Non-Occlusive  CT Angio head/neck: Thrombus in the distal left M1 segment to the bifurcation. Flow is present in reduced caliber left MCA branch vessels. The thrombus is likely nonocclusive. Saddle embolus at the innominate artery bifurcation into the right common carotid artery and right subclavian artery. This embolus is nonocclusive.  S/p Right CC and R subclavian artery thrombectomy with removal of large gelatinous single embolus with  vascular surgery on 3/30- unclear if blood clot or tissue- sent to pathology  Off anticoagulation - will need to resume anticoagulation at some point.  Diabetes  5.8, goal < 7.0  Currently on SSI  CBG monitoring  DM education  Hypertension  Home meds:   Amlodipine 5 mg daily, atenolol 100 mg daily, lisinopril 40 mg daily  Keep systolic blood pressure [phone]  Patient will be counseled to be compliant with his blood pressure medications  Hyperlipidemia  Home meds:  Atorvastatin 10 mg daily  Currently on atorvastatin 10 mg daily  LDL 59, goal < 70  Continue statin at discharge  Other Stroke Risk Factors  History of tobacco abuse  Active malignancy   Other Active Problems  Chronic normocytic anemia-hemoglobin 8.9  Mild leukocytosis -> resolved - temp 99.1  Cardiac arrest 2/2 to respiratory arrest during extubation- now re-intubated.  Right pneumothorax -> chest tube -> no pneumothorax on chest x-ray today 10/19/2016   Other Pertinent History  Stage IV Lung Cancer: Has been treated with carboplatin and Taxol, given new evidence of metastasis to left hip, plans are to start radiation therapy   Hospital day # [place] PA-C [facility] Pager [phone] 10/19/2016, 1:27 PM   I have personally examined this patient, reviewed notes, independently viewed imaging studies, participated in medical decision making and plan of care.ROS completed by me personally and pertinent positives fully documented  I have made any additions or clarifications directly to the above note. Agree with note above.Neurologically doing well. Extubate when ready per CCM. Will anticoagulate when chest tube is out.d/W Dr Chase Caller This patient is critically ill and at significant risk of neurological worsening, death and care requires constant monitoring of vital signs, hemodynamics,respiratory and cardiac monitoring, extensive review of multiple databases, frequent  neurological assessment, discussion with family, other specialists and medical decision making of high complexity.I have made any additions or clarifications  directly to the above note.This critical care time does not reflect procedure time, or teaching time or supervisory time of PA/NP/Med Resident etc but could involve care discussion time.  I spent 30 minutes of neurocritical care time  in the care of  this patient.      Antony Contras, MD Medical Director [place] Pager: [phone] 10/19/2016 1:55 PM    To contact Stroke Continuity provider, please refer to [email or web address] After hours, contact General Neurology

## 2016-10-19 NOTE — Progress Notes (Signed)
eLink Physician-Brief Progress Note Patient Name: CLIFFORD BENNINGER DOB: 1951-06-07 MRN: [id number]   Date of Service  10/19/2016  HPI/Events of Note  Constipation  eICU Interventions  Will order: 1. Colace liquid 100 mg per tube BID PRN.     Intervention Category Intermediate Interventions: Other:  Lysle Dingwall 10/19/2016, 9:44 PM

## 2016-10-19 NOTE — Progress Notes (Signed)
   Daily Progress Note   Assessment/Planning: POD #3 s/p s/p R CCA and SCA TE   Neck without any complication  Improved R hand grip  RLE arterial duplex demonstrates patency throughout  Dr. Donzetta Matters will resume care of pt tomorrow  Subjective  - 3 Days Post-Op  Intubated, follows commands  Objective Vitals:   10/19/16 0500 10/19/16 0600 10/19/16 0700 10/19/16 0810  BP: 133/79 137/83 138/82 (!) 150/90  Pulse: 94 92 90 97  Resp: '15 16 17 18  '$ Temp:    97.7 F (36.5 C)  TempSrc:    Axillary  SpO2: 99% 100% 100% 100%  Weight: 166 lb 14.2 oz (75.7 kg)     Height:         Intake/Output Summary (Last 24 hours) at 10/19/16 0821 Last data filed at 10/19/16 0729  Gross per 24 hour  Intake          3720.92 ml  Output             2441 ml  Net          1279.92 ml   PULM  intubated VASC  Faintly palpable radial and brachial pulses RLE arterial duplex (10/18/16): There appears to be unobstructed blood flow throughout the right subclavian, axillary, brachial, radial, and ulnar arteries.  Waveforms are within normal limits.  NEURO improved R hand grip, MAE to command  Laboratory CBC    Component Value Date/Time   WBC 8.3 10/19/2016 0455   HGB 8.9 (L) 10/19/2016 0455   HGB 12.8 (L) 04/25/2014 0407   HCT 27.3 (L) 10/19/2016 0455   HCT 33.6 (L) 07/07/2016 0950   PLT 231 10/19/2016 0455   PLT 329 07/07/2016 0950    BMET    Component Value Date/Time   NA 141 10/19/2016 0455   NA 141 07/07/2016 0950   NA 139 04/25/2014 0407   K 3.7 10/19/2016 0455   K 3.6 04/25/2014 0407   CL 108 10/19/2016 0455   CL 111 (H) 04/25/2014 0407   CO2 25 10/19/2016 0455   CO2 24 04/25/2014 0407   GLUCOSE 308 (H) 10/19/2016 0455   GLUCOSE 177 (H) 04/25/2014 0407   BUN 25 (H) 10/19/2016 0455   BUN 21 07/07/2016 0950   BUN 29 (H) 04/25/2014 0407   CREATININE 0.84 10/19/2016 0455   CREATININE 1.50 (H) 04/25/2014 0407   CALCIUM 8.9 10/19/2016 0455   CALCIUM 8.4 (L) 04/25/2014 0407   GFRNONAA >60 10/19/2016 0455   GFRNONAA 43 (L) 04/24/2014 0052   GFRAA >60 10/19/2016 0455   GFRAA 52 (L) 04/24/2014 0052     Adele Barthel, MD, FACS [facility] Office: [phone] Pager: [phone]  10/19/2016, 8:21 AM

## 2016-10-19 NOTE — Plan of Care (Signed)
Problem: Nutrition: Goal: Dietary intake will improve Outcome: Completed/Met Date Met: 10/19/16 TF initiated 10/17/16

## 2016-10-20 ENCOUNTER — Inpatient Hospital Stay (HOSPITAL_COMMUNITY): Payer: BLUE CROSS/BLUE SHIELD

## 2016-10-20 LAB — BASIC METABOLIC PANEL
Anion gap: 8 (ref 5–15)
BUN: 27 mg/dL — AB (ref 6–20)
CALCIUM: 9.3 mg/dL (ref 8.9–10.3)
CO2: 27 mmol/L (ref 22–32)
CREATININE: 0.79 mg/dL (ref 0.61–1.24)
Chloride: 107 mmol/L (ref 101–111)
GLUCOSE: 251 mg/dL — AB (ref 65–99)
POTASSIUM: 4.2 mmol/L (ref 3.5–5.1)
Sodium: 142 mmol/L (ref 135–145)

## 2016-10-20 LAB — GLUCOSE, CAPILLARY
GLUCOSE-CAPILLARY: 322 mg/dL — AB (ref 65–99)
Glucose-Capillary: 245 mg/dL — ABNORMAL HIGH (ref 65–99)
Glucose-Capillary: 215 mg/dL — ABNORMAL HIGH (ref 65–99)
Glucose-Capillary: 236 mg/dL — ABNORMAL HIGH (ref 65–99)
Glucose-Capillary: 150 mg/dL — ABNORMAL HIGH (ref 65–99)
Glucose-Capillary: 264 mg/dL — ABNORMAL HIGH (ref 65–99)
Glucose-Capillary: 344 mg/dL — ABNORMAL HIGH (ref 65–99)

## 2016-10-20 LAB — CBC
HCT: 30.6 % — ABNORMAL LOW (ref 39.0–52.0)
Hemoglobin: 9.7 g/dL — ABNORMAL LOW (ref 13.0–17.0)
MCH: 28.4 pg (ref 26.0–34.0)
MCHC: 31.7 g/dL (ref 30.0–36.0)
MCV: 89.7 fL (ref 78.0–100.0)
Platelets: 279 10*3/uL (ref 150–400)
RBC: 3.41 MIL/uL — AB (ref 4.22–5.81)
RDW: 14.4 % (ref 11.5–15.5)
WBC: 7.2 10*3/uL (ref 4.0–10.5)

## 2016-10-20 LAB — MAGNESIUM: Magnesium: 1.9 mg/dL (ref 1.7–2.4)

## 2016-10-20 LAB — TRIGLYCERIDES: Triglycerides: 145 mg/dL (ref <150)

## 2016-10-20 MED ORDER — FUROSEMIDE 10 MG/ML IJ SOLN
20.0000 mg | Freq: Two times a day (BID) | INTRAMUSCULAR | Status: AC
  Administered 2016-10-20 (×2): 20 mg via INTRAVENOUS
  Filled 2016-10-20 (×2): qty 2

## 2016-10-20 MED ORDER — ATORVASTATIN CALCIUM 10 MG PO TABS
10.0000 mg | ORAL_TABLET | Freq: Every day | ORAL | Status: DC
  Administered 2016-10-21 – 2016-10-22 (×2): 10 mg
  Filled 2016-10-20 (×2): qty 1

## 2016-10-20 MED ORDER — ASPIRIN 81 MG PO CHEW
324.0000 mg | CHEWABLE_TABLET | Freq: Every day | ORAL | Status: DC
  Administered 2016-10-20 – 2016-10-22 (×3): 324 mg
  Filled 2016-10-20 (×3): qty 4

## 2016-10-20 MED ORDER — METHYLPREDNISOLONE SODIUM SUCC 40 MG IJ SOLR
40.0000 mg | Freq: Every day | INTRAMUSCULAR | Status: DC
  Administered 2016-10-21 – 2016-10-22 (×2): 40 mg via INTRAVENOUS
  Filled 2016-10-20 (×2): qty 1

## 2016-10-20 MED ORDER — PANTOPRAZOLE SODIUM 40 MG PO PACK
40.0000 mg | PACK | Freq: Every day | ORAL | Status: DC
  Administered 2016-10-20 – 2016-10-22 (×3): 40 mg
  Filled 2016-10-20 (×3): qty 20

## 2016-10-20 MED ORDER — CHLORHEXIDINE GLUCONATE 0.12 % MT SOLN
OROMUCOSAL | Status: AC
  Filled 2016-10-20: qty 15

## 2016-10-20 NOTE — Progress Notes (Signed)
STROKE TEAM PROGRESS NOTE   SUBJECTIVE (INTERVAL HISTORY) Neurologically patient remains unchanged. He is alert interactive yet intubated. No family at the bedside  OBJECTIVE Temp:  [97.6 F (36.4 C)-99.1 F (37.3 C)] 97.7 F (36.5 C) (04/02 0800) Pulse Rate:  [45-116] 99 (04/02 0800) Cardiac Rhythm: Normal sinus rhythm (04/02 0800) Resp:  [12-29] 17 (04/02 0900) BP: (120-164)/(65-104) 148/85 (04/02 0900) SpO2:  [90 %-100 %] 98 % (04/02 0800) FiO2 (%):  [30 %-40 %] 30 % (04/02 0748) Weight:  [74 kg (163 lb 2.3 oz)] 74 kg (163 lb 2.3 oz) (04/02 0500)   Recent Labs Lab 10/19/16 1603 10/19/16 2002 10/20/16 0007 10/20/16 0359 10/20/16 0757  GLUCAP 218* 249* 322* 245* 215*    Recent Labs Lab 10/16/16 0557 10/16/16 1515  10/17/16 0500 10/17/16 1602 10/18/16 0235 10/18/16 1826 10/19/16 0455 10/19/16 0700 10/20/16 0449  NA 140 144  --  144  --  142  --  141  --  142  K 3.6 3.6  --  3.6  --  3.9  --  3.7  --  4.2  CL 108  --   --  111  --  111  --  108  --  107  CO2 22  --   --  22  --  23  --  25  --  27  GLUCOSE 129*  --   --  140*  --  269*  --  308*  --  251*  BUN 14  --   --  16  --  26*  --  25*  --  27*  CREATININE 1.08  --   --  1.03  --  0.89  --  0.84  --  0.79  CALCIUM 10.0  --   --  9.6  --  9.2  --  8.9  --  9.3  MG  --   --   < > 1.9 1.8 2.0 2.0  --  1.9 1.9  PHOS  --   --   --  2.8 2.8 1.7* 1.5*  --  2.0*  --   < > = values in this interval not displayed.  Recent Labs Lab 10/15/16 1143  AST 18  ALT 8*  ALKPHOS 53  BILITOT 0.5  PROT 7.0  ALBUMIN 3.0*    Recent Labs Lab 10/15/16 1143  10/16/16 0557 10/16/16 1515 10/17/16 0500 10/18/16 0235 10/19/16 0455 10/20/16 0449  WBC 11.9*  --  11.9*  --  11.4* 8.3 8.3 7.2  NEUTROABS 10.0*  --  11.1*  --   --   --   --   --   HGB 10.3*  < > 8.4* 6.8* 9.6* 9.1* 8.9* 9.7*  HCT 32.4*  < > 26.3* 20.0* 28.9* 27.9* 27.3* 30.6*  MCV 90.3  --  88.6  --  86.8 87.7 88.6 89.7  PLT 253  --  294  --  244 219  231 279  < > = values in this interval not displayed. No results for input(s): LABPROT, INR in the last 72 hours. No results for input(s): COLORURINE, LABSPEC, [place], GLUCOSEU, HGBUR, BILIRUBINUR, KETONESUR, PROTEINUR, UROBILINOGEN, NITRITE, LEUKOCYTESUR in the last 72 hours.  Invalid input(s): APPERANCEUR     Component Value Date/Time   CHOL 112 10/16/2016 0557   CHOL 153 01/02/2016 0952   CHOL 135 07/03/2015 1055   TRIG 145 10/20/2016 0449   TRIG 94 07/03/2015 1055   HDL 25 (L) 10/16/2016 0557   HDL 30 (L)  01/02/2016 0952   CHOLHDL 4.5 10/16/2016 0557   VLDL 28 10/16/2016 0557   VLDL 19 07/03/2015 1055   LDLCALC 59 10/16/2016 0557   LDLCALC 97 01/02/2016 0952   Lab Results  Component Value Date   HGBA1C 5.8 (H) 10/16/2016      Component Value Date/Time   LABOPIA NONE DETECTED 10/15/2016 1720   COCAINSCRNUR NONE DETECTED 10/15/2016 1720   LABBENZ NONE DETECTED 10/15/2016 1720   AMPHETMU NONE DETECTED 10/15/2016 1720   THCU NONE DETECTED 10/15/2016 1720   LABBARB NONE DETECTED 10/15/2016 1720     Recent Labs Lab 10/15/16 [place] <5     10/10/2016 : Status post endovascular complete revascularization of left MCA occlusion with 1 pass with the Solitaire FR 4 mm x 40 mm retrieval device achieving a TICI 3 reperfusion.  2D Echocardiogram  10/17/2016 Study Conclusions - Left ventricle: The cavity size was normal. Systolic function was   vigorous. The estimated ejection fraction was in the range of 65%   to 70%. Wall motion was normal; there were no regional wall   motion abnormalities. Doppler parameters are consistent with   abnormal left ventricular relaxation (grade 1 diastolic   dysfunction). - Aortic valve: Trileaflet; normal thickness leaflets. There was no   regurgitation. - Aortic root: The aortic root was normal in size. - Mitral valve: Structurally normal valve. - Left atrium: The atrium was normal in size. - Right ventricle: Systolic function was  normal. - Tricuspid valve: There was no regurgitation. - Pulmonary arteries: Systolic pressure could not be accurately   estimated. - Inferior vena cava: The vessel was normal in size. Impressions: - Impaired relaxation with normal filling pressures, otherwise   normal study.   EKG sinus rhythm. For complete results please see formal report.  PHYSICAL EXAM Vitals:   10/20/16 0700 10/20/16 0748 10/20/16 0800 10/20/16 0900  BP: (!) 153/85 (!) 153/85 (!) 164/91 (!) 148/85  Pulse: 77 89 99   Resp: 13 16 (!) 21 17  Temp:   97.7 F (36.5 C)   TempSrc:   Axillary   SpO2: 100% 100% 98%   Weight:      Height:       General: Vital signs reviewed.  Patient is Chronically ill appearing, appears older than stated age, intubated in no acute distress and cooperative with exam.  Cardiovascular: RRR Pulmonary/Chest: Clear to auscultation, no wheezes  Abdominal: Soft, non-tender, non-distended, BS +.  Extremities: No lower extremity edema bilaterally, pulses symmetric and intact bilaterally.   Neurological Exam : intubated Awake, alert.  . Patient moves all extremities. 4/5 weakness in right upper extremity, 5/5 right lower extremity, 4/5 left lower extremity. 5/5 in left upper extremity. Normal sensation throughout.    Following commands, with some difficultly with peripheral commands. Extraocular muscles intact, PERRLA.no focal weakness.Deep tendon reflexes are symmetric. Plantars are downgoing.  ASSESSMENT/PLAN Mr. Ralph Lopez is a 66 y.o. male with history of hypertension, type 2 diabetes, hyperlipidemia, CKD, COPD, stage IV lung cancer undergoing active treatment admitted for achalasia, right sided weakness and facial droop and found to have an acute left MCA CVA status post revascularization.   Acute left MCA Stroke with L CC thrombus s/p revascularization:    MRI: Left MCA distribution diffusion restriction is present in the left insula, left anterior temporal lobe, and left lateral  frontal lobe in distribution similar to CT perfusion CBF less than 30%.   CT Perfusion: The acute infarct involving the left MCA territory with  estimated volume of CT a less than 30% at 43 mL. Mismatch volume 131 mL.  CT Angio head/neck: Thrombus in the distal left M1 segment to the bifurcation. Flow is present in reduced caliber left MCA branch vessels. The thrombus is likely nonocclusive. Saddle embolus at the innominate artery bifurcation into the right common carotid artery and right subclavian artery. This embolus is nonocclusive.  CT Head: Improvement in left MCA hypodensity following clot retrieval.   S/p Right CC and R subclavian artery thrombectomy with removal of large gelatinous single embolus with vascular surgery on 3/30- unclear if blood clot or tissue- sent to pathology  Echo: 62-13%, grade 1 diastolic, no evidence of vegetation  LDL 59  HgbA1c 5.8  Heparin for VTE prophylaxis  Diet NPO time specified   aspirin 81 mg daily prior to admission, now on aspirin 325 mg daily  Patient will be counseled to be compliant with his antithrombotic medications  Ongoing aggressive stroke risk factor management  Therapy recommendations:  pending  Disposition:  Pending  Left Common Carotid Thrombus Non-Occlusive s/p Revascularization with IR Right Common Carotid Thrombus and Right Subclavian Thrombus and Stenosis Non-Occlusive  CT Angio head/neck: Thrombus in the distal left M1 segment to the bifurcation. Flow is present in reduced caliber left MCA branch vessels. The thrombus is likely nonocclusive. Saddle embolus at the innominate artery bifurcation into the right common carotid artery and right subclavian artery. This embolus is nonocclusive.  S/p Right CC and R subclavian artery thrombectomy with removal of large gelatinous single embolus with vascular surgery on 3/30- unclear if blood clot or tissue- sent to pathology  Off anticoagulation - will need to resume anticoagulation  at some point.  Diabetes  5.8, goal < 7.0  Currently on SSI  CBG monitoring  DM education  Hypertension  Home meds:   Amlodipine 5 mg daily, atenolol 100 mg daily, lisinopril 40 mg daily  Keep systolic blood pressure [phone]  Patient will be counseled to be compliant with his blood pressure medications  Hyperlipidemia  Home meds:  Atorvastatin 10 mg daily  Currently on atorvastatin 10 mg daily  LDL 59, goal < 70  Continue statin at discharge  Other Stroke Risk Factors  History of tobacco abuse  Active malignancy   Other Active Problems  Chronic normocytic anemia-hemoglobin 8.9  Mild leukocytosis -> resolved - temp 99.1  Cardiac arrest 2/2 to respiratory arrest during extubation- now re-intubated.  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 Discuss with Dr. Ashok Cordia. Patient still weaning and may consider extubation over the next few days. Will start anticoagulation after his extubated and chest tube is out. Continue aspirin until then. This patient is critically ill and at significant risk of neurological worsening, death and care requires constant monitoring of vital signs, hemodynamics,respiratory and cardiac monitoring, extensive review of multiple databases, frequent neurological assessment, discussion with family, other specialists and medical decision making of high complexity.I have made any additions or clarifications directly to  the above note.This critical care  time does not reflect procedure time, or teaching time or supervisory time of PA/NP/Med Resident etc but could involve care discussion time.  I spent 30 minutes of neurocritical care time  in the care of  this patient.      Antony Contras, MD Medical Director [facility] Pager: [phone] 10/20/2016 12:52 PM   To contact Stroke Continuity provider, please refer to [email or web address] After hours, contact General Neurology

## 2016-10-20 NOTE — Progress Notes (Signed)
Inpatient Diabetes Program Recommendations  AACE/ADA: New Consensus Statement on Inpatient Glycemic Control (2015)  Target Ranges:  Prepandial:   less than 140 mg/dL      Peak postprandial:   less than 180 mg/dL (1-2 hours)      Critically ill patients:  140 - 180 mg/dL   Lab Results  Component Value Date   GLUCAP 236 (H) 10/20/2016   HGBA1C 5.8 (H) 10/16/2016    Review of Glycemic Control  Diabetes history: DM, CKD  Outpatient Diabetes medications: Toujeo 30 units daily  Current orders for Inpatient glycemic control: Novolog 0-15 units Q4H, NPO   Inpatient Diabetes Program Recommendations:   Insulin - Basal: Please consider Lantus 15 units (74 Kg * 0.2 units/Kg)  Thank you,  Windy Carina, RN, MSN Diabetes Coordinator Inpatient Diabetes Program [phone] (Team Pager)

## 2016-10-20 NOTE — Progress Notes (Signed)
Inpatient Rehabilitation  Per PT request, patient was screened by Gunnar Fusi for appropriateness for an Inpatient Acute Rehab consult.  At this time we are planning to follow along for extubation prior to recommending an Inpatient Rehab consult.  Please call if questions.   Carmelia Roller., CCC/SLP Admission Coordinator  [facility]  Cell [phone]

## 2016-10-20 NOTE — Progress Notes (Signed)
  Progress Note    10/20/2016 1:45 PM 4 Days Post-Op  Subjective:  No acute issues  Vitals:   10/20/16 1130 10/20/16 1200  BP: (!) 159/86   Pulse: 96 (!) 103  Resp: 17   Temp:  97.4 F (36.3 C)    Physical Exam: Intubated, follows commands Moving all 4 extremities without limitation R neck incision soft, no hematoma Palpable R brachial/radial pulses  CBC    Component Value Date/Time   WBC 7.2 10/20/2016 0449   RBC 3.41 (L) 10/20/2016 0449   HGB 9.7 (L) 10/20/2016 0449   HGB 12.8 (L) 04/25/2014 0407   HCT 30.6 (L) 10/20/2016 0449   HCT 33.6 (L) 07/07/2016 0950   PLT 279 10/20/2016 0449   PLT 329 07/07/2016 0950   MCV 89.7 10/20/2016 0449   MCV 86 07/07/2016 0950   MCV 96 04/25/2014 0407   MCH 28.4 10/20/2016 0449   MCHC 31.7 10/20/2016 0449   RDW 14.4 10/20/2016 0449   RDW 19.0 (H) 07/07/2016 0950   RDW 13.3 04/25/2014 0407   LYMPHSABS 0.4 (L) 10/16/2016 0557   LYMPHSABS 0.5 (L) 07/07/2016 0950   LYMPHSABS 0.6 (L) 04/25/2014 0407   MONOABS 0.4 10/16/2016 0557   MONOABS 0.6 04/25/2014 0407   EOSABS 0.0 10/16/2016 0557   EOSABS 0.0 04/25/2014 0407   BASOSABS 0.0 10/16/2016 0557   BASOSABS 0.0 04/25/2014 0407    BMET    Component Value Date/Time   NA 142 10/20/2016 0449   NA 141 07/07/2016 0950   NA 139 04/25/2014 0407   K 4.2 10/20/2016 0449   K 3.6 04/25/2014 0407   CL 107 10/20/2016 0449   CL 111 (H) 04/25/2014 0407   CO2 27 10/20/2016 0449   CO2 24 04/25/2014 0407   GLUCOSE 251 (H) 10/20/2016 0449   GLUCOSE 177 (H) 04/25/2014 0407   BUN 27 (H) 10/20/2016 0449   BUN 21 07/07/2016 0950   BUN 29 (H) 04/25/2014 0407   CREATININE 0.79 10/20/2016 0449   CREATININE 1.50 (H) 04/25/2014 0407   CALCIUM 9.3 10/20/2016 0449   CALCIUM 8.4 (L) 04/25/2014 0407   GFRNONAA >60 10/20/2016 0449   GFRNONAA 43 (L) 04/24/2014 0052   GFRAA >60 10/20/2016 0449   GFRAA 52 (L) 04/24/2014 0052    INR    Component Value Date/Time   INR 1.09 10/15/2016 1143      Intake/Output Summary (Last 24 hours) at 10/20/16 1345 Last data filed at 10/20/16 1200  Gross per 24 hour  Intake          2580.73 ml  Output             2100 ml  Net           480.73 ml     Assessment/Plan:  66 y.o. male is s/p R carotid and subclavian embolectomy, neuro in tact. On asa, path pending. Will follow.    Yliana Gravois C. Donzetta Matters, MD [facility] Office: [phone] Pager: [phone]  10/20/2016 1:45 PM

## 2016-10-20 NOTE — Progress Notes (Signed)
[facility] Attending Note  Presenting HPI:  66 y.o. male with PMH of DM, HTN, HLD, CKD, COPD, Stage 4 Lung CA, and Depression. Of note patient is being treated with carboplatin and Taxol. Has had radiation therapy with a planned PET soon. However, he has been complaining of hip pain in which he was told was due to a pelvic mass. Presents to ED on 3/28 as a code stroke. Wife reports seeing him at 0300 in his normal state when he got up to go to the bathroom. Later in the morning wife found him on the ground of the kitchen aphasic. 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 Upon arrival to the ED CT revealed a large left MCA. Patient was intubated and then underwent a left common carotid arteriogram followed followed by complete revascularization. PCCM asked to consult for vent management.  Subjective:  No acute events overnight. Started on Colace for constipation. Patient signifies no pain or difficulty breathing. Currently undergoing pressure support wean.  Review of Systems:  Unable to obtain given intubation.   Vent Mode: PRVC FiO2 (%):  [30 %-40 %] 30 % Set Rate:  [14 bmp] 14 bmp Vt Set:  [500 mL] 500 mL PEEP:  [5 cmH20] 5 cmH20 Plateau Pressure:  [16 cmH20-22 cmH20] 16 cmH20  Temp:  [97.6 F (36.4 C)-99.1 F (37.3 C)] 97.7 F (36.5 C) (04/02 0800) Pulse Rate:  [45-106] 99 (04/02 0800) Resp:  [12-21] 14 (04/02 1000) BP: (120-164)/(65-104) 148/85 (04/02 0900) SpO2:  [90 %-100 %] 98 % (04/02 0800) FiO2 (%):  [30 %-40 %] 30 % (04/02 0748) Weight:  [163 lb 2.3 oz (74 kg)] 163 lb 2.3 oz (74 kg) (04/02 0500)  General:  No family at bedside. Intubated. No distress. Integument:  Warm & dry. No rash on exposed skin. HEENT:  Moist mucus memebranes. No scleral icterus. Endotracheal tube in place. Neurological:  Pupils symmetric. Moving all 4 extremities equally. Following commands. Musculoskeletal:  No joint effusion or erythema appreciated. Symmetric  muscle bulk. Pulmonary:  Symmetric chest wall rise on ventilator. Clear breath sounds bilaterally. Cardiovascular:  Regular rate & rhythm. No appreciable JVD. Normal S1 & S2. Telemetry:  Sinus rhythm. Abdomen:  Soft. Nondistended. Normoactive bowel sounds.  LINES/TUBES: OETT 7.5 3/28 - 3/30; 3/30 >>> OGT 3/30 >>> R CHEST TUBE >>> PIV  CBC Latest Ref Rng & Units 10/20/2016 10/19/2016 10/18/2016  WBC 4.0 - 10.5 K/uL 7.2 8.3 8.3  Hemoglobin 13.0 - 17.0 g/dL 9.7(L) 8.9(L) 9.1(L)  Hematocrit 39.0 - 52.0 % 30.6(L) 27.3(L) 27.9(L)  Platelets 150 - 400 K/uL 279 231 219   BMP Latest Ref Rng & Units 10/20/2016 10/19/2016 10/18/2016  Glucose 65 - 99 mg/dL 251(H) 308(H) 269(H)  BUN 6 - 20 mg/dL 27(H) 25(H) 26(H)  Creatinine 0.61 - 1.24 mg/dL 0.79 0.84 0.89  BUN/Creat Ratio 10 - 24 - - -  Sodium 135 - 145 mmol/L 142 141 142  Potassium 3.5 - 5.1 mmol/L 4.2 3.7 3.9  Chloride 101 - 111 mmol/L 107 108 111  CO2 22 - 32 mmol/L 27 25 23   Calcium 8.9 - 10.3 mg/dL 9.3 8.9 9.2   Hepatic Function Latest Ref Rng & Units 10/15/2016 09/04/2016 08/14/2016  Total Protein 6.5 - 8.1 g/dL 7.0 7.5 7.3  Albumin 3.5 - 5.0 g/dL 3.0(L) 3.8 3.5  AST 15 - 41 U/L 18 15 23   ALT 17 - 63 U/L 8(L) 10(L) 10(L)  Alk Phosphatase 38 - 126 U/L 53 77  57  Total Bilirubin 0.3 - 1.2 mg/dL 0.5 0.6 0.4    IMAGING/STUDIES: CTA HEAD/NECK 3/28: 1. Thrombus in the distal left M1 segment to the bifurcation. Flow is present in reduced caliber left MCA branch vessels. The thrombus is likely nonocclusive. Collateral vessels are also present. 2. Saddle embolus at the innominate artery bifurcation into the right common carotid artery and right subclavian artery. This embolus is nonocclusive. 3. Atherosclerotic changes at the carotid bifurcations and cavernous internal carotid arteries bilaterally without significant stenosis. 4. Atherosclerotic calcifications with moderate stenosis in the proximal left vertebral artery. MRI BRAIN W/O 3/29:  Left  MCA distribution diffusion restriction is present in the left insula, left anterior temporal lobe, and left lateral frontal lobe in distribution similar to CT perfusion CBF less than 30%. There is a small additional area of diffusion restriction within the left parietal lobe not indicated on the perfusion study. Findings are compatible with acute infarction. No associated hemorrhage identified. CT CHEST W/O 3/30: 1. Moderate-sized RIGHT pneumothorax as described on radiograph same day. 2. 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 3. Endotracheal tube in good position. 4. RIGHT suprahilar mass. 5. Bilateral round pulmonary metastasis significantly enlarged from prior PET-CT scan. PORT CXR 4/1:  Personally reviewed by me. No appreciable right pneumothorax. Right-sided chest tube in place. Right hilar mass noted. Endotracheal tube and enteric feeding tube in good position.  MICROBIOLOGY: MRSA PCR 3/28:  Negative   ANTIBIOTICS: None.   SIGNIFICANT EVENTS: 03/28 - Admit w/ revascularization for L MCA CVA 03/30 - Extubated & had respiratory arrest that was <34mn w/ CPR & no drugs. Awake post arrest per report.  ASSESSMENT/PLAN:  66y.o. male admitted with acute left MCA CVA and subsequent acute hypoxic respiratory failure requiring reintubation after brief acute respiratory arrest. Patient appears largely nonfocal post arrest. Does have a right pneumothorax status post chest tube placement.  1. Left MCA CVA: Management per neurology & primary service. Status post revascularization. 2. Acute hypoxic respiratory failure: Multifactorial. Continuing pressure support wean. Rectal diuresis with Lasix IV every 12 hours 2 doses. Continuing daily spontaneous breathing trial.  3. Right subclavian artery embolus:  Status post treatment by vascular surgery. 4. Stage IV lung cancer: Patient with known pelvic mass. 5. Right pneumothorax: Status post chest tube  on 3/30. Repeating portable chest x-ray today. Continuing chest tube to continuous suction. Repeat chest x-ray in the morning as well. Holding on chest tube removal until patient is successfully extubated. 6. Possible COPD exacerbation: Continuing Duonebs scheduled every 6 hours. Switching to Solu-Medrol 40 mg IV every 24 hours. 7. Essential hypertension: Mildly hypertensive. Goal systolic blood pressure per primary service. Gentle diuresis with Lasix IV. 8. History of hyperlipidemia: Continuing Lipitor via the tube. 9. Chronic renal failure: Baseline creatinine 1.4-1.7. Renal function appears to be optimized and at baseline. Monitoring urine output and renal function closely as we attempt gentle diuresis. 10. History of GERD: Continuing Protonix via tube daily. 11. History of dysphagia: Speech evaluation postextubation. 12. Hyperglycemia: No history of diabetes mellitus. Likely secondary to steroids. Continuing Accu-Checks q4hr w/ SSI per moderate algorithm while decreasing IV steroids.   Prophylaxis:  SCDs & switching to Protonix via tube daily. Diet:  Tube feedings. NPO pending speech evaluation post extubation.  Code Status:  Full Code Status per previous physician discussions. Disposition:  Remains in the ICU while endotracheally intubated. Family Update:  No family at bedside during rounds. Patient updated.   I have personally  spent a total of 32 minutes of critical care time today caring for the patient, updating patient at bedside, & reviewing the aptient's electronic medical record.  Sonia Baller Ashok Cordia, M.D. [facility] Pager:  [phone] After 3pm or if no response, call [phone] 10:18 AM 10/20/16

## 2016-10-20 NOTE — Evaluation (Signed)
Occupational Therapy Evaluation Patient Details Name: Ralph Lopez MRN: [id number] DOB: February 21, 1951 Today's Date: 10/20/2016    History of Present Illness Pt is a 66 y.o. male who presents to ED on 10/15/16 after being found down with R-side weakness and aphasia at home. 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 CXR 3/28showed multiple R rib fxs. On 3/29, had R CCA and SCA thromboembolectomy. CXR 3/30 showed R basilar pneumothorax and emphysema with chest tube placement. Pt remains intubated (failed extubation with resp arrest on 3//30). 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     Clinical Impression   PT admitted with see above. Pt currently with functional limitiations due to the deficits listed below (see OT problem list). PTA was independent with adls.  Pt will benefit from skilled OT to increase their independence and safety with adls and balance to allow discharge SNF/ hospice. Recommend PALLIATIVE CONSULT.     Follow Up Recommendations  SNF;Other (comment) (hospice)    Equipment Recommendations  Wheelchair (measurements OT);Wheelchair cushion (measurements OT);Hospital bed    Recommendations for Other Services Other (comment) (palliative consult)     Precautions / Restrictions Precautions Precautions: Fall Precaution Comments: Lines: vent, chest tube. Parameters: SBP 130-150 Restrictions Weight Bearing Restrictions: No      Mobility Bed Mobility Overal bed mobility: Needs Assistance Bed Mobility: Supine to Sit;Sit to Supine     Supine to sit: +2 for safety/equipment     General bed mobility comments: MinA to roll to L-side x2 for pericare. Supine<>sit with minA +2 for line management.   Transfers Overall transfer level: Needs assistance Equipment used: None Transfers: Sit to/from Stand Sit to Stand:  Mod assist;+2 safety/equipment         General transfer comment: Sit-to-stand x2 with sides steps to sit higher in bed with modA for balance (+2 for line management).     Balance Overall balance assessment: Needs assistance Sitting-balance support: No upper extremity supported;Feet supported Sitting balance-Leahy Scale: Fair     Standing balance support: During functional activity;No upper extremity supported Standing balance-Leahy Scale: Poor                             ADL either performed or assessed with clinical judgement   ADL Overall ADL's : Needs assistance/impaired Eating/Feeding: NPO   Grooming: Wash/dry face;Min guard;Sitting   Upper Body Bathing: Moderate assistance   Lower Body Bathing: Total assistance           Toilet Transfer: +2 for physical assistance;Moderate assistance             General ADL Comments: pt attempting to mouth over vent. pt with pen and paper and writing to therapist. pt with some structural errors in sentence with spelling errors but attempting to communicate hx of L knee pain and gout     Vision         Perception     Praxis      Pertinent Vitals/Pain Pain Assessment: 0-10 Pain Score: 10-Worst pain ever Pain Location: L knee (Pt reports baseline gout in L knee) Pain Descriptors / Indicators: Constant Pain Intervention(s): Monitored during session;Premedicated before session;Repositioned     Hand Dominance Right   Extremity/Trunk Assessment Upper Extremity Assessment Upper Extremity Assessment: Generalized weakness (able to hold pen and write)   Lower Extremity  Assessment Lower Extremity Assessment: Defer to PT evaluation   Cervical / Trunk Assessment Cervical / Trunk Assessment: Normal   Communication Communication Communication: Other (comment) (Vent)   Cognition Arousal/Alertness: Awake/alert Behavior During Therapy: WFL for tasks assessed/performed Overall Cognitive Status: Difficult to  assess                                 General Comments: A&O x4. Difficulty communicating secondary to vent, but able to appropriately nod yes/no. Wrote down some responses to questions, but some words did not make sense.    General Comments       Exercises     Shoulder Instructions      Home Living Family/patient expects to be discharged to:: Private residence Living Arrangements: Spouse/significant other Available Help at Discharge: Family;Available 24 hours/day Type of Home: House Home Access: Stairs to enter [employer] of Steps: 5 Entrance Stairs-Rails: Right (R rail w/ wall on left) Home Layout: One level     Bathroom Shower/Tub: Occupational psychologist: Handicapped height     Home Equipment: Environmental consultant - 2 wheels;Shower seat;Bedside commode          Prior Functioning/Environment Level of Independence: Independent with assistive device(s)        Comments: Mod indep with intermittent use of RW (doctor recommended for hip metastasis).         OT Problem List: Decreased strength;Decreased activity tolerance;Impaired balance (sitting and/or standing);Decreased cognition;Decreased safety awareness;Decreased knowledge of use of DME or AE;Decreased knowledge of precautions;Cardiopulmonary status limiting activity;Impaired UE functional use;Pain      OT Treatment/Interventions: Self-care/ADL training;Therapeutic exercise;Neuromuscular education;Energy conservation;DME and/or AE instruction;Therapeutic activities;Cognitive remediation/compensation;Patient/family education;Balance training    OT Goals(Current goals can be found in the care plan section) Acute Rehab OT Goals Patient Stated Goal: Return home OT Goal Formulation: With patient/family Time For Goal Achievement: 11/03/16 Potential to Achieve Goals: Good  OT Frequency: Min 2X/week   Barriers to D/C:            Co-evaluation              End of Session Equipment  Utilized During Treatment: Oxygen Nurse Communication: Mobility status;Precautions  Activity Tolerance: Patient tolerated treatment well Patient left: in bed;with call bell/phone within reach;with nursing/sitter in room  OT Visit Diagnosis: Unsteadiness on feet (R26.81)                Time: 5183-3582 OT Time Calculation (min): 22 min Charges:  OT General Charges $OT Visit: 1 Procedure OT Evaluation $OT Eval Moderate Complexity: 1 Procedure G-Codes:      Jeri Modena   OTR/L Pager: [phone] Office: [phone] .   Parke Poisson B 10/20/2016, 4:05 PM

## 2016-10-20 NOTE — Evaluation (Signed)
Physical Therapy Evaluation Patient Details Name: Ralph Lopez MRN: [id number] DOB: 1950/11/24 Today's Date: 10/20/2016   History of Present Illness  Pt is a 66 y.o. male who presents to ED on 10/15/16 after being found down with R-side weakness and aphasia at home. 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 CXR 3/28showed multiple R rib fxs. On 3/29, had R CCA and SCA thromboembolectomy. CXR 3/30 showed R basilar pneumothorax and emphysema with chest tube placement. Pt remains intubated (failed extubation with resp arrest on 3//30). 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    Clinical Impression  Pt presents to PT with generalized weakness and an overall decrease in functional mobility secondary to above. Pt intubated, but able to appropriately nod yes/no for answering questions with family present and able to verify. Able to write responses, although some words incomprehensible. PTA, pt mod indep with intermittent use of RW secondary to LLE pain; lives with wife. Family nearby and able to provide 24/7 supervision if needed. Today, pt performed bed mob and stood with min-modA +2. Motivated to work with PT. Pt would benefit from continued acute PT services to maximize functional mobility and independence.     Follow Up Recommendations CIR;Supervision/Assistance - 24 hour    Equipment Recommendations  Other (comment) (Defer to next venue)    Recommendations for Other Services Rehab consult     Precautions / Restrictions Precautions Precautions: Fall Precaution Comments: Lines: vent, chest tube. Parameters: SBP 130-150 Restrictions Weight Bearing Restrictions: No      Mobility  Bed Mobility Overal bed mobility: Needs Assistance Bed Mobility: Supine to Sit;Sit to Supine     Supine to sit: +2 for safety/equipment      General bed mobility comments: MinA to roll to L-side x2 for pericare. Supine<>sit with minA +2 for line management.   Transfers Overall transfer level: Needs assistance Equipment used: None Transfers: Sit to/from Stand Sit to Stand: Mod assist;+2 safety/equipment         General transfer comment: Sit-to-stand x2 with sides steps to sit higher in bed with modA for balance (+2 for line management).   Ambulation/Gait                Stairs            Wheelchair Mobility    Modified Rankin (Stroke Patients Only) Modified Rankin (Stroke Patients Only) Pre-Morbid Rankin Score: No symptoms Modified Rankin: Moderately severe disability     Balance Overall balance assessment: Needs assistance Sitting-balance support: No upper extremity supported;Feet supported Sitting balance-Leahy Scale: Fair     Standing balance support: During functional activity;No upper extremity supported Standing balance-Leahy Scale: Poor                               Pertinent Vitals/Pain Pain Assessment: 0-10 Pain Score: 10-Worst pain ever Pain Location: L knee (Pt reports baseline gout in L knee) Pain Descriptors / Indicators: Constant Pain Intervention(s): Limited activity within patient's tolerance;Repositioned    Home Living Family/patient expects to be discharged to:: Private residence Living Arrangements: Spouse/significant other Available Help at Discharge: Family;Available 24 hours/day Type of Home: House Home Access: Stairs to enter Entrance Stairs-Rails: Right (R rail w/ wall on left) Entrance Stairs-Number of Steps: 5 Home Layout: One level Home Equipment: Walker - 2 wheels;Shower seat;Bedside  commode      Prior Function Level of Independence: Independent with assistive device(s)         Comments: Mod indep with intermittent use of RW (doctor recommended for hip metastasis).      Hand Dominance   Dominant Hand: Right    Extremity/Trunk  Assessment   Upper Extremity Assessment Upper Extremity Assessment: Defer to OT evaluation    Lower Extremity Assessment Lower Extremity Assessment: Generalized weakness       Communication   Communication: Other (comment) (Vent)  Cognition Arousal/Alertness: Awake/alert Behavior During Therapy: WFL for tasks assessed/performed Overall Cognitive Status: Difficult to assess                                 General Comments: A&O x4. Difficulty communicating secondary to vent, but able to appropriately nod yes/no. Wrote down some responses to questions, but some words did not make sense.       General Comments      Exercises     Assessment/Plan    PT Assessment Patient needs continued PT services  PT Problem List Decreased strength;Decreased activity tolerance;Decreased balance;Decreased mobility;Decreased knowledge of precautions;Decreased knowledge of use of DME;Cardiopulmonary status limiting activity;Pain       PT Treatment Interventions DME instruction;Gait training;Therapeutic exercise;Balance training;Stair training;Functional mobility training;Therapeutic activities;Patient/family education    PT Goals (Current goals can be found in the Care Plan section)  Acute Rehab PT Goals Patient Stated Goal: Return home PT Goal Formulation: With patient/family Time For Goal Achievement: 11/03/16 Potential to Achieve Goals: Fair    Frequency Min 4X/week   Barriers to discharge        Co-evaluation               End of Session Equipment Utilized During Treatment: Gait belt;Oxygen Activity Tolerance: Patient tolerated treatment well Patient left: in bed;with nursing/sitter in room;with family/visitor present;with call bell/phone within reach;with SCD's reapplied Nurse Communication: Mobility status PT Visit Diagnosis: Other abnormalities of gait and mobility (R26.89);Muscle weakness (generalized) (M62.81)    Time: 0315-9458 PT Time Calculation  (min) (ACUTE ONLY): 26 min   Charges:   PT Evaluation $PT Eval High Complexity: 1 Procedure     PT G Codes:       Enis Gash, SPT Office-[phone]  Mabeline Caras 10/20/2016, 1:04 PM

## 2016-10-20 NOTE — Progress Notes (Signed)
SLP Cancellation Note  Patient Details Name: Ralph Lopez MRN: [id number] DOB: 09/25/1950   Cancelled treatment:       Reason Eval/Treat Not Completed: Patient not medically ready, remains intubated. SLP to sign off - please reorder when ready.   Germain Osgood 10/20/2016, 8:47 AM   Germain Osgood, M.A. CCC-SLP [phone]

## 2016-10-21 ENCOUNTER — Inpatient Hospital Stay (HOSPITAL_COMMUNITY): Payer: BLUE CROSS/BLUE SHIELD

## 2016-10-21 LAB — CBC WITH DIFFERENTIAL/PLATELET
BASOS PCT: 0 %
Basophils Absolute: 0 10*3/uL (ref 0.0–0.1)
EOS ABS: 0 10*3/uL (ref 0.0–0.7)
EOS PCT: 0 %
HEMATOCRIT: 28.5 % — AB (ref 39.0–52.0)
Hemoglobin: 9.4 g/dL — ABNORMAL LOW (ref 13.0–17.0)
Lymphocytes Relative: 25 %
Lymphs Abs: 1.4 10*3/uL (ref 0.7–4.0)
MCH: 29.7 pg (ref 26.0–34.0)
MCHC: 33 g/dL (ref 30.0–36.0)
MCV: 90.2 fL (ref 78.0–100.0)
MONO ABS: 0.7 10*3/uL (ref 0.1–1.0)
Monocytes Relative: 13 %
NEUTROS PCT: 62 %
Neutro Abs: 3.6 10*3/uL (ref 1.7–7.7)
PLATELETS: 250 10*3/uL (ref 150–400)
RBC: 3.16 MIL/uL — ABNORMAL LOW (ref 4.22–5.81)
RDW: 14.5 % (ref 11.5–15.5)
WBC: 5.7 10*3/uL (ref 4.0–10.5)

## 2016-10-21 LAB — RENAL FUNCTION PANEL
Albumin: 2.2 g/dL — ABNORMAL LOW (ref 3.5–5.0)
Anion gap: 13 (ref 5–15)
BUN: 40 mg/dL — AB (ref 6–20)
CALCIUM: 9 mg/dL (ref 8.9–10.3)
CHLORIDE: 107 mmol/L (ref 101–111)
CO2: 23 mmol/L (ref 22–32)
CREATININE: 0.89 mg/dL (ref 0.61–1.24)
Glucose, Bld: 229 mg/dL — ABNORMAL HIGH (ref 65–99)
Phosphorus: 1.5 mg/dL — ABNORMAL LOW (ref 2.5–4.6)
Potassium: 3.6 mmol/L (ref 3.5–5.1)
SODIUM: 143 mmol/L (ref 135–145)

## 2016-10-21 LAB — GLUCOSE, CAPILLARY
GLUCOSE-CAPILLARY: 214 mg/dL — AB (ref 65–99)
GLUCOSE-CAPILLARY: 213 mg/dL — AB (ref 65–99)
GLUCOSE-CAPILLARY: 384 mg/dL — AB (ref 65–99)
Glucose-Capillary: 182 mg/dL — ABNORMAL HIGH (ref 65–99)
Glucose-Capillary: 332 mg/dL — ABNORMAL HIGH (ref 65–99)
Glucose-Capillary: 263 mg/dL — ABNORMAL HIGH (ref 65–99)

## 2016-10-21 LAB — MAGNESIUM: MAGNESIUM: 1.8 mg/dL (ref 1.7–2.4)

## 2016-10-21 MED ORDER — INSULIN GLARGINE 100 UNIT/ML ~~LOC~~ SOLN
7.0000 [IU] | Freq: Two times a day (BID) | SUBCUTANEOUS | Status: DC
  Administered 2016-10-21 – 2016-10-22 (×4): 7 [IU] via SUBCUTANEOUS
  Filled 2016-10-21 (×5): qty 0.07

## 2016-10-21 MED ORDER — VITAL AF 1.2 CAL PO LIQD
1000.0000 mL | ORAL | Status: DC
  Administered 2016-10-21: 1000 mL

## 2016-10-21 MED ORDER — FUROSEMIDE 10 MG/ML IJ SOLN
40.0000 mg | Freq: Two times a day (BID) | INTRAMUSCULAR | Status: DC
  Administered 2016-10-21: 40 mg via INTRAVENOUS
  Filled 2016-10-21: qty 4

## 2016-10-21 MED ORDER — PRO-STAT SUGAR FREE PO LIQD
30.0000 mL | Freq: Two times a day (BID) | ORAL | Status: DC
  Administered 2016-10-21 – 2016-10-22 (×2): 30 mL
  Filled 2016-10-21 (×2): qty 30

## 2016-10-21 MED ORDER — CHLORHEXIDINE GLUCONATE 0.12 % MT SOLN
OROMUCOSAL | Status: AC
  Administered 2016-10-21: 15 mL
  Filled 2016-10-21: qty 15

## 2016-10-21 MED ORDER — POTASSIUM PHOSPHATES 15 MMOLE/5ML IV SOLN
20.0000 mmol | Freq: Once | INTRAVENOUS | Status: AC
  Administered 2016-10-21: 20 mmol via INTRAVENOUS
  Filled 2016-10-21: qty 6.67

## 2016-10-21 MED ORDER — FUROSEMIDE 10 MG/ML IJ SOLN
40.0000 mg | Freq: Four times a day (QID) | INTRAMUSCULAR | Status: AC
  Administered 2016-10-21 – 2016-10-22 (×3): 40 mg via INTRAVENOUS
  Filled 2016-10-21 (×3): qty 4

## 2016-10-21 NOTE — Progress Notes (Signed)
[facility] Attending Note  Presenting HPI:  66 y.o. male with PMH of DM, HTN, HLD, CKD, COPD, Stage 4 Lung CA, and Depression. Of note patient is being treated with carboplatin and Taxol. Has had radiation therapy with a planned PET soon. However, he has been complaining of hip pain in which he was told was due to a pelvic mass. Presents to ED on 3/28 as a code stroke. Wife reports seeing him at 0300 in his normal state when he got up to go to the bathroom. Later in the morning wife found him on the ground of the kitchen aphasic. 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 Upon arrival to the ED CT revealed a large left MCA. Patient was intubated and then underwent a left common carotid arteriogram followed followed by complete revascularization. PCCM asked to consult for vent management.  Subjective:  Not neg I/o as hoped for. Wide awake on vent.  Review of Systems:  Unable to obtain given intubation.   Vent Mode: PSV;CPAP FiO2 (%):  [30 %-40 %] 30 % Set Rate:  [14 bmp] 14 bmp Vt Set:  [500 mL] 500 mL PEEP:  [5 cmH20] 5 cmH20 Pressure Support:  [5 cmH20-8 cmH20] 5 cmH20 Plateau Pressure:  [15 cmH20-20 cmH20] 20 cmH20  Temp:  [97.2 F (36.2 C)-98.7 F (37.1 C)] 98.4 F (36.9 C) (04/03 0400) Pulse Rate:  [79-117] 95 (04/03 0700) Resp:  [14-22] 14 (04/03 0700) BP: (95-159)/(61-97) 117/73 (04/03 0700) SpO2:  [92 %-100 %] 99 % (04/03 0835) FiO2 (%):  [30 %-40 %] 30 % (04/03 0835) Weight:  [76.4 kg (168 lb 6.9 oz)] 76.4 kg (168 lb 6.9 oz) (04/03 0400)  General:  No family at bedside. Intubated. No distress. Integument:  Warm & [email or web address] HEENT:  Ett-> vent, no jvd Neurological:  Intact, follows commands Musculoskeletal: Intact Decreased bs bases Cardiovascular:  HSR RRR Telemetry:  Sinus rhythm. Abdomen:  Soft. Nondistended. Normoactive bowel sounds.  LINES/TUBES: OETT 7.5 3/28 - 3/30; 3/30 >>> OGT 3/30 >>> R CHEST TUBE >>> PIV  CBC Latest  Ref Rng & Units 10/21/2016 10/20/2016 10/19/2016  WBC 4.0 - 10.5 K/uL 5.7 7.2 8.3  Hemoglobin 13.0 - 17.0 g/dL 9.4(L) 9.7(L) 8.9(L)  Hematocrit 39.0 - 52.0 % 28.5(L) 30.6(L) 27.3(L)  Platelets 150 - 400 K/uL 250 279 231   BMP Latest Ref Rng & Units 10/21/2016 10/20/2016 10/19/2016  Glucose 65 - 99 mg/dL 229(H) 251(H) 308(H)  BUN 6 - 20 mg/dL 40(H) 27(H) 25(H)  Creatinine 0.61 - 1.24 mg/dL 0.89 0.79 0.84  BUN/Creat Ratio 10 - 24 - - -  Sodium 135 - 145 mmol/L 143 142 141  Potassium 3.5 - 5.1 mmol/L 3.6 4.2 3.7  Chloride 101 - 111 mmol/L 107 107 108  CO2 22 - 32 mmol/L 23 27 25   Calcium 8.9 - 10.3 mg/dL 9.0 9.3 8.9   Hepatic Function Latest Ref Rng & Units 10/21/2016 10/15/2016 09/04/2016  Total Protein 6.5 - 8.1 g/dL - 7.0 7.5  Albumin 3.5 - 5.0 g/dL 2.2(L) 3.0(L) 3.8  AST 15 - 41 U/L - 18 15  ALT 17 - 63 U/L - 8(L) 10(L)  Alk Phosphatase 38 - 126 U/L - 53 77  Total Bilirubin 0.3 - 1.2 mg/dL - 0.5 0.6    IMAGING/STUDIES: CTA HEAD/NECK 3/28: 1. Thrombus in the distal left M1 segment to the bifurcation. Flow is present in reduced caliber left MCA branch vessels. The thrombus is likely nonocclusive. Collateral vessels are  also present. 2. Saddle embolus at the innominate artery bifurcation into the right common carotid artery and right subclavian artery. This embolus is nonocclusive. 3. Atherosclerotic changes at the carotid bifurcations and cavernous internal carotid arteries bilaterally without significant stenosis. 4. Atherosclerotic calcifications with moderate stenosis in the proximal left vertebral artery. MRI BRAIN W/O 3/29:  Left MCA distribution diffusion restriction is present in the left insula, left anterior temporal lobe, and left lateral frontal lobe in distribution similar to CT perfusion CBF less than 30%. There is a small additional area of diffusion restriction within the left parietal lobe not indicated on the perfusion study. Findings are compatible with acute infarction. No  associated hemorrhage identified. CT CHEST W/O 3/30: 1. Moderate-sized RIGHT pneumothorax as described on radiograph same day. 2. 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 3. Endotracheal tube in good position. 4. RIGHT suprahilar mass. 5. Bilateral round pulmonary metastasis significantly enlarged from prior PET-CT scan. PORT CXR 4/1:  Personally reviewed by me. No appreciable right pneumothorax. Right-sided chest tube in place. Right hilar mass noted. Endotracheal tube and enteric feeding tube in good position.  MICROBIOLOGY: MRSA PCR 3/28:  Negative   ANTIBIOTICS: None.   SIGNIFICANT EVENTS: 03/28 - Admit w/ revascularization for L MCA CVA 03/30 - Extubated & had respiratory arrest that was <11mn w/ CPR & no drugs. Awake post arrest per report.  Intake/Output Summary (Last 24 hours) at 10/21/16 0848 Last data filed at 10/21/16 0804  Gross per 24 hour  Intake           3396.9 ml  Output             1765 ml  Net           1631.9 ml    ASSESSMENT/PLAN:  66y.o. male admitted with acute left MCA CVA and subsequent acute hypoxic respiratory failure requiring reintubation after brief acute respiratory arrest. Patient appears largely nonfocal post arrest. Does have a right pneumothorax status post chest tube placement also with rib fxs  1. Left MCA CVA: Management per neurology & primary service. Status post revascularization. 2. Acute hypoxic respiratory failure: Multifactorial. Continuing pressure support wean. Rectal diuresis with Lasix IV every 12 hours 2 doses. Continuing daily spontaneous breathing trial. Note on 4/3 he remains in ++I/o. Try on 0 ps. LAsix repeat 4/3 3. Right subclavian artery embolus, msa have been tissue per vascular surgeon.Bx pending:  Status post treatment by vascular surgery. ASA only , no anticoagulation. 4. Stage IV lung cancer: Patient with known pelvic mass. 5. Right pneumothorax:  Right fx ribs x 2.  Status post chest tube on 3/30. Repeating portable chest x-ray today. Continuing chest tube to continuous suction. Repeat chest x-ray daily. Holding on chest tube removal until patient is successfully extubated. 6. Possible COPD exacerbation: Continuing Duonebs scheduled every 6 hours. Switching to Solu-Medrol 40 mg IV every 24 hours. 7. Essential hypertension: Mildly hypertensive. Goal systolic blood pressure per primary service. Gentle diuresis with Lasix IV. 8. History of hyperlipidemia: Continuing Lipitor via the tube. 9. Chronic renal failure: Baseline creatinine 1.4-1.7. Renal function appears to be optimized and at baseline. Monitoring urine output and renal function closely as we attempt gentle diuresis. 10. History of GERD: Continuing Protonix via tube daily. 11. History of dysphagia: Speech evaluation postextubation. 12. Hyperglycemia: 4/3 add lantus to MAR.   Note he promptly failed extubation 3/31 and CT revealed fx ribs along with bilateral mets.  Prophylaxis:  SCDs & switching  to Protonix via tube daily. Diet:  Tube feedings. NPO pending speech evaluation post extubation.  Code Status:  Full Code Status per previous physician discussions. Disposition:  Remains in the ICU while endotracheally intubated. Family Update:  No family currently at bedside  App cct 15 min   Richardson Landry Minor ACNP Maryanna Shape PCCM Pager [phone] till 3 pm If no answer page [phone] 10/21/2016, 8:45 AM

## 2016-10-21 NOTE — Progress Notes (Signed)
STROKE TEAM PROGRESS NOTE   SUBJECTIVE (INTERVAL HISTORY) Neurologically patient remains unchanged. He is alert interactive yet intubated. No family at the bedside.D/W CCM team they are yet deciding on timing of extubation  OBJECTIVE Temp:  [97.2 F (36.2 C)-98.7 F (37.1 C)] 98.3 F (36.8 C) (04/03 0800) Pulse Rate:  [79-116] 93 (04/03 0800) Cardiac Rhythm: Normal sinus rhythm (04/03 0800) Resp:  [14-22] 20 (04/03 1000) BP: (95-159)/(61-97) 133/74 (04/03 1000) SpO2:  [94 %-100 %] 99 % (04/03 1000) FiO2 (%):  [30 %-40 %] 30 % (04/03 1000) Weight:  [168 lb 6.9 oz (76.4 kg)] 168 lb 6.9 oz (76.4 kg) (04/03 0400)   Recent Labs Lab 10/20/16 1607 10/20/16 1945 10/20/16 2323 10/21/16 0311 10/21/16 0801  GLUCAP 150* 264* 344* 214* 213*    Recent Labs Lab 10/17/16 0500 10/17/16 1602 10/18/16 0235 10/18/16 1826 10/19/16 0455 10/19/16 0700 10/20/16 0449 10/21/16 0300  NA 144  --  142  --  141  --  142 143  K 3.6  --  3.9  --  3.7  --  4.2 3.6  CL 111  --  111  --  108  --  107 107  CO2 22  --  23  --  25  --  27 23  GLUCOSE 140*  --  269*  --  308*  --  251* 229*  BUN 16  --  26*  --  25*  --  27* 40*  CREATININE 1.03  --  0.89  --  0.84  --  0.79 0.89  CALCIUM 9.6  --  9.2  --  8.9  --  9.3 9.0  MG 1.9 1.8 2.0 2.0  --  1.9 1.9 1.8  PHOS 2.8 2.8 1.7* 1.5*  --  2.0*  --  1.5*    Recent Labs Lab 10/15/16 1143 10/21/16 0300  AST 18  --   ALT 8*  --   ALKPHOS 53  --   BILITOT 0.5  --   PROT 7.0  --   ALBUMIN 3.0* 2.2*    Recent Labs Lab 10/15/16 1143  10/16/16 0557  10/17/16 0500 10/18/16 0235 10/19/16 0455 10/20/16 0449 10/21/16 0300  WBC 11.9*  --  11.9*  --  11.4* 8.3 8.3 7.2 5.7  NEUTROABS 10.0*  --  11.1*  --   --   --   --   --  3.6  HGB 10.3*  < > 8.4*  < > 9.6* 9.1* 8.9* 9.7* 9.4*  HCT 32.4*  < > 26.3*  < > 28.9* 27.9* 27.3* 30.6* 28.5*  MCV 90.3  --  88.6  --  86.8 87.7 88.6 89.7 90.2  PLT 253  --  294  --  244 219 231 279 250  < > = values in  this interval not displayed. No results for input(s): LABPROT, INR in the last 72 hours. No results for input(s): COLORURINE, LABSPEC, [place], GLUCOSEU, HGBUR, BILIRUBINUR, KETONESUR, PROTEINUR, UROBILINOGEN, NITRITE, LEUKOCYTESUR in the last 72 hours.  Invalid input(s): APPERANCEUR     Component Value Date/Time   CHOL 112 10/16/2016 0557   CHOL 153 01/02/2016 0952   CHOL 135 07/03/2015 1055   TRIG 145 10/20/2016 0449   TRIG 94 07/03/2015 1055   HDL 25 (L) 10/16/2016 0557   HDL 30 (L) 01/02/2016 0952   CHOLHDL 4.5 10/16/2016 0557   VLDL 28 10/16/2016 0557   VLDL 19 07/03/2015 1055   LDLCALC 59 10/16/2016 0557   LDLCALC 97 01/02/2016 7741  Lab Results  Component Value Date   HGBA1C 5.8 (H) 10/16/2016      Component Value Date/Time   LABOPIA NONE DETECTED 10/15/2016 1720   COCAINSCRNUR NONE DETECTED 10/15/2016 1720   LABBENZ NONE DETECTED 10/15/2016 1720   AMPHETMU NONE DETECTED 10/15/2016 1720   THCU NONE DETECTED 10/15/2016 1720   LABBARB NONE DETECTED 10/15/2016 1720     Recent Labs Lab 10/15/16 1143  ETH <5     10/10/2016 : Status post endovascular complete revascularization of left MCA occlusion with 1 pass with the Solitaire FR 4 mm x 40 mm retrieval device achieving a TICI 3 reperfusion.  2D Echocardiogram  10/17/2016 Study Conclusions - Left ventricle: The cavity size was normal. Systolic function was   vigorous. The estimated ejection fraction was in the range of 65%   to 70%. Wall motion was normal; there were no regional wall   motion abnormalities. Doppler parameters are consistent with   abnormal left ventricular relaxation (grade 1 diastolic   dysfunction). - Aortic valve: Trileaflet; normal thickness leaflets. There was no   regurgitation. - Aortic root: The aortic root was normal in size. - Mitral valve: Structurally normal valve. - Left atrium: The atrium was normal in size. - Right ventricle: Systolic function was normal. - Tricuspid valve:  There was no regurgitation. - Pulmonary arteries: Systolic pressure could not be accurately   estimated. - Inferior vena cava: The vessel was normal in size. Impressions: - Impaired relaxation with normal filling pressures, otherwise   normal study.   EKG sinus rhythm. For complete results please see formal report.  PHYSICAL EXAM Vitals:   10/21/16 0800 10/21/16 0835 10/21/16 0900 10/21/16 1000  BP: 124/75  (!) 143/80 133/74  Pulse: 93     Resp: '15  18 20  '$ Temp: 98.3 F (36.8 C)     TempSrc: Axillary     SpO2: 100% 99%  99%  Weight:      Height:       General: Vital signs reviewed.  Patient is Chronically ill appearing, appears older than stated age, intubated in no acute distress and cooperative with exam.  Cardiovascular: RRR Pulmonary/Chest: Clear to auscultation, no wheezes  Abdominal: Soft, non-tender, non-distended, BS +.  Extremities: No lower extremity edema bilaterally, pulses symmetric and intact bilaterally.   Neurological Exam : intubated Awake, alert.  . Patient moves all extremities. 4/5 weakness in right upper extremity, 5/5 right lower extremity, 4/5 left lower extremity. 5/5 in left upper extremity. Normal sensation throughout.    Following commands, with some difficultly with peripheral commands. Extraocular muscles intact, PERRLA.no focal weakness.Deep tendon reflexes are symmetric. Plantars are downgoing.  ASSESSMENT/PLAN Ralph Lopez is a 66 y.o. male with history of hypertension, type 2 diabetes, hyperlipidemia, CKD, COPD, stage IV lung cancer undergoing active treatment admitted for achalasia, right sided weakness and facial droop and found to have an acute left MCA CVA status post revascularization.   Acute left MCA Stroke with L CC thrombus s/p revascularization:    MRI: Left MCA distribution diffusion restriction is present in the left insula, left anterior temporal lobe, and left lateral frontal lobe in distribution similar to CT perfusion CBF  less than 30%.   CT Perfusion: The acute infarct involving the left MCA territory with estimated volume of CT a less than 30% at 43 mL. Mismatch volume 131 mL.  CT Angio head/neck: Thrombus in the distal left M1 segment to the bifurcation. Flow is present in reduced caliber left MCA branch  vessels. The thrombus is likely nonocclusive. Saddle embolus at the innominate artery bifurcation into the right common carotid artery and right subclavian artery. This embolus is nonocclusive.  CT Head: Improvement in left MCA hypodensity following clot retrieval.   S/p Right CC and R subclavian artery thrombectomy with removal of large gelatinous single embolus with vascular surgery on 3/30- unclear if blood clot or tissue- sent to pathology  Echo: 81-82%, grade 1 diastolic, no evidence of vegetation  LDL 59  HgbA1c 5.8  Heparin for VTE prophylaxis  Diet NPO time specified   aspirin 81 mg daily prior to admission, now on aspirin 325 mg daily  Patient will be counseled to be compliant with his antithrombotic medications  Ongoing aggressive stroke risk factor management  Therapy recommendations:  pending  Disposition:  Pending  Left Common Carotid Thrombus Non-Occlusive s/p Revascularization with IR Right Common Carotid Thrombus and Right Subclavian Thrombus and Stenosis Non-Occlusive  CT Angio head/neck: Thrombus in the distal left M1 segment to the bifurcation. Flow is present in reduced caliber left MCA branch vessels. The thrombus is likely nonocclusive. Saddle embolus at the innominate artery bifurcation into the right common carotid artery and right subclavian artery. This embolus is nonocclusive.  S/p Right CC and R subclavian artery thrombectomy with removal of large gelatinous single embolus with vascular surgery on 3/30- unclear if blood clot or tissue- sent to pathology  Off anticoagulation - will need to resume anticoagulation at some point.  Diabetes  5.8, goal <  7.0  Currently on SSI  CBG monitoring  DM education  Hypertension  Home meds:   Amlodipine 5 mg daily, atenolol 100 mg daily, lisinopril 40 mg daily  Keep systolic blood pressure [phone]  Patient will be counseled to be compliant with his blood pressure medications  Hyperlipidemia  Home meds:  Atorvastatin 10 mg daily  Currently on atorvastatin 10 mg daily  LDL 59, goal < 70  Continue statin at discharge  Other Stroke Risk Factors  History of tobacco abuse  Active malignancy   Other Active Problems  Chronic normocytic anemia-hemoglobin 8.9  Mild leukocytosis -> resolved - temp 99.1  Cardiac arrest 2/2 to respiratory arrest during extubation- now re-intubated.  Right pneumothorax -> chest tube -> no pneumothorax on chest x-ray today 10/19/2016   Other Pertinent History  Stage IV Lung Cancer: Has been treated with carboplatin and Taxol, given new evidence of metastasis to left hip, plans are to start radiation therapy   Hospital day # 6 Plan will start anticoagulation after extubation and chest tube removal.Continue aspirin I have personally examined this patient, reviewed notes, independently viewed imaging studies, participated in medical decision making and plan of care.ROS completed by me personally and pertinent positives fully documented  I have made any additions or clarifications directly to the above note. Discuss with Dr. Ashok Cordia. Patient still weaning and may consider extubation over the next few days. Will start anticoagulation after his extubated and chest tube is out. Continue aspirin until then. This patient is critically ill and at significant risk of neurological worsening, death and care requires constant monitoring of vital signs, hemodynamics,respiratory and cardiac monitoring, extensive review of multiple databases, frequent neurological assessment, discussion with family, other specialists and medical decision making of high complexity.I have made  any additions or clarifications directly to the above note.This critical care time does not reflect procedure time, or teaching time or supervisory time of PA/NP/Med Resident etc but could involve care discussion time.  I spent 30 minutes of  neurocritical care time  in the care of  this patient.      Antony Contras, MD Medical Director [place] Pager: [phone] 10/21/2016 11:09 AM   To contact Stroke Continuity provider, please refer to [email or web address] After hours, contact General Neurology

## 2016-10-21 NOTE — Progress Notes (Signed)
Nutrition Follow-up  INTERVENTION:   Decrease Vital AF 1.2 to 50 ml/hr (1200 ml/day) Increase 30 ml Prostat to BID Provides: 1740 kcal, 120 grams protein, and 973 ml free water.   NUTRITION DIAGNOSIS:   Inadequate oral intake related to inability to eat as evidenced by NPO status. Ongoing.   GOAL:   Patient will meet greater than or equal to 90% of their needs Met.   MONITOR:   TF tolerance, Vent status, Labs  ASSESSMENT:   Pt with hx of stage 4 lung cancer, COPD, DM, HTN, CKD, and depression admitted with large L MCA infarct s/p complete revascularization 3/28 and R thromboembolectomy for saddle embolus 3/29. Failed extubated 3/30 and was emergently re-intubated.   Per MD notes, no extubation until fluid balance is better. Currently he is +8.5 L Family at bedside, pt trying to communicate so gave him his clipboard but writing did not make sense.  Patient is currently intubated on ventilator support MV: 7.4 L/min Temp (24hrs), Avg:98.3 F (36.8 C), Min:97.2 F (36.2 C), Max:98.7 F (37.1 C)  Propofol: off this am Labs reviewed: PO4 1.7-1.5-2.0-1.5 Medications reviewed and include: potassium phosphate 20 mmol OG tube: Vital AF 1.2 @ 55 with 30 ml Prostat daily: 1684 kcal, 114 grams protein   Diet Order:  Diet NPO time specified  Skin:  Reviewed, no issues  Last BM:  unknown  Height:   Ht Readings from Last 1 Encounters:  10/17/16 5' 10"  (1.778 m)    Weight:   Wt Readings from Last 1 Encounters:  10/21/16 168 lb 6.9 oz (76.4 kg)    Ideal Body Weight:  75.4 kg  BMI:  Body mass index is 24.17 kg/m.  Estimated Nutritional Needs:   Kcal:  4709  Protein:  110-125 grams  Fluid:  > 1.7 L/day  EDUCATION NEEDS:   No education needs identified at this time  [place], [place], [place] Pager [phone] After Hours Pager

## 2016-10-21 NOTE — Progress Notes (Signed)
  Progress Note    10/21/2016 9:23 AM 5 Days Post-Op  Subjective:  No acute issues  Vitals:   10/21/16 0700 10/21/16 0800  BP: 117/73   Pulse: 95   Resp: 14   Temp:  98.3 F (36.8 C)    Physical Exam: Intubated, follows commands Moving all 4 extremities without limitation R neck incision without hematoma Right brachial pulse is easily palpable  CBC    Component Value Date/Time   WBC 5.7 10/21/2016 0300   RBC 3.16 (L) 10/21/2016 0300   HGB 9.4 (L) 10/21/2016 0300   HGB 12.8 (L) 04/25/2014 0407   HCT 28.5 (L) 10/21/2016 0300   HCT 33.6 (L) 07/07/2016 0950   PLT 250 10/21/2016 0300   PLT 329 07/07/2016 0950   MCV 90.2 10/21/2016 0300   MCV 86 07/07/2016 0950   MCV 96 04/25/2014 0407   MCH 29.7 10/21/2016 0300   MCHC 33.0 10/21/2016 0300   RDW 14.5 10/21/2016 0300   RDW 19.0 (H) 07/07/2016 0950   RDW 13.3 04/25/2014 0407   LYMPHSABS 1.4 10/21/2016 0300   LYMPHSABS 0.5 (L) 07/07/2016 0950   LYMPHSABS 0.6 (L) 04/25/2014 0407   MONOABS 0.7 10/21/2016 0300   MONOABS 0.6 04/25/2014 0407   EOSABS 0.0 10/21/2016 0300   EOSABS 0.0 04/25/2014 0407   BASOSABS 0.0 10/21/2016 0300   BASOSABS 0.0 04/25/2014 0407    BMET    Component Value Date/Time   NA 143 10/21/2016 0300   NA 141 07/07/2016 0950   NA 139 04/25/2014 0407   K 3.6 10/21/2016 0300   K 3.6 04/25/2014 0407   CL 107 10/21/2016 0300   CL 111 (H) 04/25/2014 0407   CO2 23 10/21/2016 0300   CO2 24 04/25/2014 0407   GLUCOSE 229 (H) 10/21/2016 0300   GLUCOSE 177 (H) 04/25/2014 0407   BUN 40 (H) 10/21/2016 0300   BUN 21 07/07/2016 0950   BUN 29 (H) 04/25/2014 0407   CREATININE 0.89 10/21/2016 0300   CREATININE 1.50 (H) 04/25/2014 0407   CALCIUM 9.0 10/21/2016 0300   CALCIUM 8.4 (L) 04/25/2014 0407   GFRNONAA >60 10/21/2016 0300   GFRNONAA 43 (L) 04/24/2014 0052   GFRAA >60 10/21/2016 0300   GFRAA 52 (L) 04/24/2014 0052    INR    Component Value Date/Time   INR 1.09 10/15/2016 1143      Intake/Output Summary (Last 24 hours) at 10/21/16 0923 Last data filed at 10/21/16 0804  Gross per 24 hour  Intake           3310.5 ml  Output             1590 ml  Net           1720.5 ml    Assessment/Plan:  66 y.o. male is s/p R carotid and subclavian embolectomy, neuro in tact. Path pending. On aspirin.     Mayerli Kirst C. Donzetta Matters, MD [facility] Office: [phone] Pager: [phone]  10/21/2016 9:23 AM

## 2016-10-22 ENCOUNTER — Inpatient Hospital Stay (HOSPITAL_COMMUNITY): Payer: BLUE CROSS/BLUE SHIELD

## 2016-10-22 ENCOUNTER — Ambulatory Visit: Admission: RE | Admit: 2016-10-22 | Payer: BLUE CROSS/BLUE SHIELD | Source: Ambulatory Visit

## 2016-10-22 DIAGNOSIS — I63032 Cerebral infarction due to thrombosis of left carotid artery: Secondary | ICD-10-CM

## 2016-10-22 LAB — RENAL FUNCTION PANEL
ANION GAP: 9 (ref 5–15)
Albumin: 2.2 g/dL — ABNORMAL LOW (ref 3.5–5.0)
BUN: 38 mg/dL — ABNORMAL HIGH (ref 6–20)
CALCIUM: 8.9 mg/dL (ref 8.9–10.3)
CHLORIDE: 100 mmol/L — AB (ref 101–111)
CO2: 32 mmol/L (ref 22–32)
Creatinine, Ser: 0.83 mg/dL (ref 0.61–1.24)
Glucose, Bld: 144 mg/dL — ABNORMAL HIGH (ref 65–99)
Phosphorus: 3.3 mg/dL (ref 2.5–4.6)
Potassium: 3.4 mmol/L — ABNORMAL LOW (ref 3.5–5.1)
Sodium: 141 mmol/L (ref 135–145)

## 2016-10-22 LAB — GLUCOSE, CAPILLARY
GLUCOSE-CAPILLARY: 141 mg/dL — AB (ref 65–99)
GLUCOSE-CAPILLARY: 199 mg/dL — AB (ref 65–99)
GLUCOSE-CAPILLARY: 156 mg/dL — AB (ref 65–99)
GLUCOSE-CAPILLARY: 86 mg/dL (ref 65–99)
Glucose-Capillary: 195 mg/dL — ABNORMAL HIGH (ref 65–99)
Glucose-Capillary: 250 mg/dL — ABNORMAL HIGH (ref 65–99)

## 2016-10-22 LAB — CBC WITH DIFFERENTIAL/PLATELET
BASOS PCT: 0 %
Basophils Absolute: 0 10*3/uL (ref 0.0–0.1)
EOS ABS: 0 10*3/uL (ref 0.0–0.7)
EOS PCT: 0 %
HCT: 30.6 % — ABNORMAL LOW (ref 39.0–52.0)
Hemoglobin: 9.9 g/dL — ABNORMAL LOW (ref 13.0–17.0)
LYMPHS PCT: 11 %
Lymphs Abs: 0.9 10*3/uL (ref 0.7–4.0)
MCH: 28.7 pg (ref 26.0–34.0)
MCHC: 32.4 g/dL (ref 30.0–36.0)
MCV: 88.7 fL (ref 78.0–100.0)
MONO ABS: 1.2 10*3/uL — AB (ref 0.1–1.0)
Monocytes Relative: 14 %
NEUTROS PCT: 75 %
Neutro Abs: 6.5 10*3/uL (ref 1.7–7.7)
PLATELETS: 269 10*3/uL (ref 150–400)
RBC: 3.45 MIL/uL — AB (ref 4.22–5.81)
RDW: 14.3 % (ref 11.5–15.5)
WBC: 8.6 10*3/uL (ref 4.0–10.5)

## 2016-10-22 LAB — MAGNESIUM: Magnesium: 1.7 mg/dL (ref 1.7–2.4)

## 2016-10-22 LAB — TRIGLYCERIDES: TRIGLYCERIDES: 86 mg/dL (ref <150)

## 2016-10-22 NOTE — Progress Notes (Signed)
STROKE TEAM PROGRESS NOTE   SUBJECTIVE (INTERVAL HISTORY) Neurologically Doing great. He was extubated this morning and is breathing well so far. Is able to speak with a soft slightly hoarse voice  OBJECTIVE Temp:  [97.2 F (36.2 C)-98.9 F (37.2 C)] 98.2 F (36.8 C) (04/04 1200) Pulse Rate:  [94-114] 99 (04/04 1100) Cardiac Rhythm: Normal sinus rhythm (04/03 1900) Resp:  [13-21] 18 (04/04 1100) BP: (91-135)/(48-103) 107/62 (04/04 1100) SpO2:  [95 %-100 %] 99 % (04/04 1100) FiO2 (%):  [30 %] 30 % (04/04 1100) Weight:  [160 lb 15 oz (73 kg)] 160 lb 15 oz (73 kg) (04/04 0400)   Recent Labs Lab 10/21/16 1955 10/21/16 2326 10/22/16 0314 10/22/16 0809 10/22/16 1131  GLUCAP 332* 263* 141* 199* 195*    Recent Labs Lab 10/18/16 0235 10/18/16 1826 10/19/16 0455 10/19/16 0700 10/20/16 0449 10/21/16 0300 10/22/16 0420  NA 142  --  141  --  142 143 141  K 3.9  --  3.7  --  4.2 3.6 3.4*  CL 111  --  108  --  107 107 100*  CO2 23  --  25  --  27 23 32  GLUCOSE 269*  --  308*  --  251* 229* 144*  BUN 26*  --  25*  --  27* 40* 38*  CREATININE 0.89  --  0.84  --  0.79 0.89 0.83  CALCIUM 9.2  --  8.9  --  9.3 9.0 8.9  MG 2.0 2.0  --  1.9 1.9 1.8 1.7  PHOS 1.7* 1.5*  --  2.0*  --  1.5* 3.3    Recent Labs Lab 10/21/16 0300 10/22/16 0420  ALBUMIN 2.2* 2.2*    Recent Labs Lab 10/16/16 0557  10/18/16 0235 10/19/16 0455 10/20/16 0449 10/21/16 0300 10/22/16 0420  WBC 11.9*  < > 8.3 8.3 7.2 5.7 8.6  NEUTROABS 11.1*  --   --   --   --  3.6 6.5  HGB 8.4*  < > 9.1* 8.9* 9.7* 9.4* 9.9*  HCT 26.3*  < > 27.9* 27.3* 30.6* 28.5* 30.6*  MCV 88.6  < > 87.7 88.6 89.7 90.2 88.7  PLT 294  < > 219 231 279 250 269  < > = values in this interval not displayed. No results for input(s): LABPROT, INR in the last 72 hours. No results for input(s): COLORURINE, LABSPEC, [place], GLUCOSEU, HGBUR, BILIRUBINUR, KETONESUR, PROTEINUR, UROBILINOGEN, NITRITE, LEUKOCYTESUR in the last 72  hours.  Invalid input(s): APPERANCEUR     Component Value Date/Time   CHOL 112 10/16/2016 0557   CHOL 153 01/02/2016 0952   CHOL 135 07/03/2015 1055   TRIG 86 10/22/2016 0420   TRIG 94 07/03/2015 1055   HDL 25 (L) 10/16/2016 0557   HDL 30 (L) 01/02/2016 0952   CHOLHDL 4.5 10/16/2016 0557   VLDL 28 10/16/2016 0557   VLDL 19 07/03/2015 1055   LDLCALC 59 10/16/2016 0557   LDLCALC 97 01/02/2016 0952   Lab Results  Component Value Date   HGBA1C 5.8 (H) 10/16/2016      Component Value Date/Time   LABOPIA NONE DETECTED 10/15/2016 1720   COCAINSCRNUR NONE DETECTED 10/15/2016 1720   LABBENZ NONE DETECTED 10/15/2016 1720   AMPHETMU NONE DETECTED 10/15/2016 1720   THCU NONE DETECTED 10/15/2016 1720   LABBARB NONE DETECTED 10/15/2016 1720    No results for input(s): ETH in the last 168 hours.   10/10/2016 : Status post endovascular complete revascularization of left MCA  occlusion with 1 pass with the Solitaire FR 4 mm x 40 mm retrieval device achieving a TICI 3 reperfusion.  2D Echocardiogram  10/17/2016 Study Conclusions - Left ventricle: The cavity size was normal. Systolic function was   vigorous. The estimated ejection fraction was in the range of 65%   to 70%. Wall motion was normal; there were no regional wall   motion abnormalities. Doppler parameters are consistent with   abnormal left ventricular relaxation (grade 1 diastolic   dysfunction). - Aortic valve: Trileaflet; normal thickness leaflets. There was no   regurgitation. - Aortic root: The aortic root was normal in size. - Mitral valve: Structurally normal valve. - Left atrium: The atrium was normal in size. - Right ventricle: Systolic function was normal. - Tricuspid valve: There was no regurgitation. - Pulmonary arteries: Systolic pressure could not be accurately   estimated. - Inferior vena cava: The vessel was normal in size. Impressions: - Impaired relaxation with normal filling pressures, otherwise    normal study.   EKG sinus rhythm. For complete results please see formal report.  PHYSICAL EXAM Vitals:   10/22/16 0900 10/22/16 1000 10/22/16 1100 10/22/16 1200  BP: 91/65 118/72 107/62   Pulse: (!) 102 (!) 103 99   Resp: '18 17 18   '$ Temp:    98.2 F (36.8 C)  TempSrc:    Axillary  SpO2: 98% 100% 99%   Weight:      Height:       General: Vital signs reviewed.  Patient is Chronically ill appearing, appears older than stated age, intubated in no acute distress and cooperative with exam.  Cardiovascular: RRR Pulmonary/Chest: Clear to auscultation, no wheezes  Abdominal: Soft, non-tender, non-distended, BS +.  Extremities: No lower extremity edema bilaterally, pulses symmetric and intact bilaterally.   Neurological Exam :Awake, alert.  . Patient moves all extremities.well equally without any focal weakness Normal sensation throughout.    Following commands, with some difficultly with peripheral commands. Extraocular muscles intact, PERRLA.no focal weakness.Deep tendon reflexes are symmetric. Plantars are downgoing.  ASSESSMENT/PLAN Ralph Lopez is a 66 y.o. male with history of hypertension, type 2 diabetes, hyperlipidemia, CKD, COPD, stage IV lung cancer undergoing active treatment admitted for achalasia, right sided weakness and facial droop and found to have an acute left MCA CVA status post revascularization.   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   CT Perfusion: The acute infarct involving the left MCA territory with estimated volume of CT a less than 30% at 43 mL. Mismatch volume 131 mL.  CT Angio head/neck: Thrombus in the distal left M1 segment to the bifurcation. Flow is present in reduced caliber left MCA branch vessels. The thrombus is likely  nonocclusive. Saddle embolus at the innominate artery bifurcation into the right common carotid artery and right subclavian artery. This embolus is nonocclusive.  CT Head: Improvement in left MCA hypodensity following clot retrieval.   S/p Right CC and R subclavian artery thrombectomy with removal of large gelatinous single embolus with vascular surgery on 3/30- unclear if blood clot or tissue- sent to pathology  Echo: 09-98%, grade 1 diastolic, no evidence of vegetation  LDL 59  HgbA1c 5.8  Heparin for VTE prophylaxis  Diet NPO time specified   aspirin 81 mg daily prior to  admission, now on aspirin 325 mg daily  Patient will be counseled to be compliant with his antithrombotic medications  Ongoing aggressive stroke risk factor management  Therapy recommendations:  pending  Disposition:  Pending  Left Common Carotid Thrombus Non-Occlusive s/p Revascularization with IR Right Common Carotid Thrombus and Right Subclavian Thrombus and Stenosis Non-Occlusive  CT Angio head/neck: Thrombus in the distal left M1 segment to the bifurcation. Flow is present in reduced caliber left MCA branch vessels. The thrombus is likely nonocclusive. Saddle embolus at the innominate artery bifurcation into the right common carotid artery and right subclavian artery. This embolus is nonocclusive.  S/p Right CC and R subclavian artery thrombectomy with removal of large gelatinous single embolus with vascular surgery on 3/30- unclear if blood clot or tissue- sent to pathology  Off anticoagulation - will need to resume anticoagulation at some point.  Diabetes  5.8, goal < 7.0  Currently on SSI  CBG monitoring  DM education  Hypertension  Home meds:   Amlodipine 5 mg daily, atenolol 100 mg daily, lisinopril 40 mg daily  Keep systolic blood pressure [phone]  Patient will be counseled to be compliant with his blood pressure medications  Hyperlipidemia  Home meds:  Atorvastatin 10 mg  daily  Currently on atorvastatin 10 mg daily  LDL 59, goal < 70  Continue statin at discharge  Other Stroke Risk Factors  History of tobacco abuse  Active malignancy   Other Active Problems  Chronic normocytic anemia-hemoglobin 8.9  Mild leukocytosis -> resolved - temp 99.1  Cardiac arrest 2/2 to respiratory arrest during extubation- now re-intubated.  Right pneumothorax -> chest tube -> no pneumothorax on chest x-ray today 10/19/2016   Other Pertinent History  Stage IV Lung Cancer: Has been treated with carboplatin and Taxol, given new evidence of metastasis to left hip, plans are to start radiation therapy   Hospital day # 7 Plan will start warfarin if patient is able to swallow. PTOT and speech consults..Continue aspirin I have personally examined this patient, reviewed notes, independently viewed imaging studies, participated in medical decision making and plan of care.ROS completed by me personally and pertinent positives fully documented  I have made any additions or clarifications directly to the above note. Discussed with Dr. Ashok Cordia. And wife and multiple family members at the bedside.  Will start anticoagulation when able to swallow safely. Continue aspirin until then. This patient is critically ill and at significant risk of neurological worsening, death and care requires constant monitoring of vital signs, hemodynamics,respiratory and cardiac monitoring, extensive review of multiple databases, frequent neurological assessment, discussion with family, other specialists and medical decision making of high complexity.I have made any additions or clarifications directly to the above note.This critical care time does not reflect procedure time, or teaching time or supervisory time of PA/NP/Med Resident etc but could involve care discussion time.  I spent 30 minutes of neurocritical care time  in the care of  this patient.      Ralph Contras, MD Medical Director [employer] Pager: [phone] 10/22/2016 12:42 PM   To contact Stroke Continuity provider, please refer to [email or web address] After hours, contact General Neurology

## 2016-10-22 NOTE — Evaluation (Signed)
Clinical/Bedside Swallow Evaluation Patient Details  Name: Ralph Lopez MRN: [id number] Date of Birth: 1951/01/23  Today's Date: 10/22/2016 Time: SLP Start Time (ACUTE ONLY): 1638 SLP Stop Time (ACUTE ONLY): 1653 SLP Time Calculation (min) (ACUTE ONLY): 15 min  Past Medical History:  Past Medical History:  Diagnosis Date  . Anxiety   . Cancer ([place])    Stage IV Lung  . Chronic kidney disease   . COPD (chronic obstructive pulmonary disease) ([place])   . Depression   . Diabetes mellitus without complication ([place])   . Hyperlipidemia   . Hypertension    Past Surgical History:  Past Surgical History:  Procedure Laterality Date  . CAROTID-SUBCLAVIAN BYPASS GRAFT Right 10/16/2016   Procedure: RIGHT CAROTID-SUBCLAVIAN THROMBECTOMY;  Surgeon: Waynetta Sandy, MD;  Location: [facility];  Service: Vascular;  Laterality: Right;  . EYE SURGERY    . FLEXIBLE BRONCHOSCOPY N/A 04/28/2016   Procedure: FLEXIBLE BRONCHOSCOPY;  Surgeon: [name], MD;  Location: [facility];  Service: Pulmonary;  Laterality: N/A;  . IR GENERIC HISTORICAL  10/15/2016   IR PERCUTANEOUS ART THROMBECTOMY/INFUSION INTRACRANIAL INC DIAG ANGIO 10/15/2016 Luanne Bras, MD [facility]  . IR GENERIC HISTORICAL  10/15/2016   IR ANGIOGRAM EXTREMITY RIGHT 10/15/2016 Luanne Bras, MD [facility]  . IR GENERIC HISTORICAL  10/15/2016   IR ANGIO INTRA EXTRACRAN SEL COM CAROTID INNOMINATE UNI R MOD SED 10/15/2016 Luanne Bras, MD [facility]  . MANDIBLE SURGERY    . RADIOLOGY WITH ANESTHESIA N/A 10/15/2016   Procedure: RADIOLOGY WITH ANESTHESIA;  Surgeon: Luanne Bras, MD;  Location: [place];  Service: Radiology;  Laterality: N/A;   HPI:  Pt is a 66 y.o. male who presents to ED on 10/15/16 after being found down with R-side weakness and aphasia at home. 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 CXR 3/28 showed  multiple R rib fxs. On 3/29, had R CCA and SCA thromboembolectomy. CXR 3/30 showed R basilar pneumothorax and emphysema with chest tube placement. Pt was  intubated 3/28 (failed extubation with resp arrest on 3/30) , extubated 10/22/16. 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    Assessment / Plan / Recommendation Clinical Impression  Pt's voice is mildly hoarse s/p prolonged intubation. His voice became audibly wet s/p cup sips of thin liquids with delayed coughing that followed. Initial trials of pureed solids did not elicit overt signs of aspiration, but as they continued and as he started to incorporate cup sips of nectar thick liquids, he started to cough more. For today, recommend ice chips following oral care and meds crushed in puree. Will f/u on next date for potential diet advancement. MD, please consider ordering SLP cognitive-lingusitic evaluation as well given aphasia noted during this assessment. SLP Visit Diagnosis: Dysphagia, unspecified (R13.10)    Aspiration Risk  Moderate aspiration risk    Diet Recommendation NPO except meds;Ice chips PRN after oral care   Medication Administration: Crushed with puree Supervision: Full supervision/cueing for compensatory strategies    Other  Recommendations Oral Care Recommendations: Oral care QID;Oral care prior to ice chip/H20   Follow up Recommendations Home health SLP;24 hour supervision/assistance      Frequency and Duration min 2x/week  2 weeks       Prognosis Prognosis for Safe Diet Advancement: Good Barriers to Reach Goals: Language deficits      Swallow Study  General HPI: Pt is a 66 y.o. male who presents to ED on 10/15/16 after being found down with R-side weakness and aphasia at home. 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 CXR 3/28 showed multiple R rib  fxs. On 3/29, had R CCA and SCA thromboembolectomy. CXR 3/30 showed R basilar pneumothorax and emphysema with chest tube placement. Pt was  intubated 3/28 (failed extubation with resp arrest on 3/30) , extubated 10/22/16. 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  Type of Study: Bedside Swallow Evaluation Previous Swallow Assessment: none in chart Diet Prior to this Study: NPO Temperature Spikes Noted: No Respiratory Status: Nasal cannula History of Recent Intubation: Yes Length of Intubations (days): 8 days (failed extubation on 3/30) Date extubated: 10/22/16 Behavior/Cognition: Alert;Cooperative;Pleasant mood;Other (Comment) (aphasia) Oral Cavity Assessment: Within Functional Limits Oral Cavity - Dentition: Dentures, top;Dentures, bottom (loosely fitting) Vision: Functional for self-feeding Self-Feeding Abilities: Able to feed self Patient Positioning: Upright in chair Baseline Vocal Quality: Hoarse Volitional Cough: Weak (mildly weak) Volitional Swallow: Able to elicit    Oral/Motor/Sensory Function     Ice Chips Ice chips: Within functional limits Presentation: Spoon   Thin Liquid Thin Liquid: Impaired Presentation: Cup;Self Fed;Spoon Pharyngeal  Phase Impairments: Cough - Immediate    Nectar Thick Nectar Thick Liquid: Impaired Presentation: Cup;Self Fed Pharyngeal Phase Impairments: Cough - Immediate   Honey Thick Honey Thick Liquid: Not tested   Puree Puree: Impaired Presentation: Self Fed;Spoon Pharyngeal Phase Impairments: Cough - Immediate   Solid   GO   Solid: Not tested        Germain Osgood 10/22/2016,5:59 PM  Germain Osgood, M.A. CCC-SLP [phone]

## 2016-10-22 NOTE — Progress Notes (Signed)
Inpatient Diabetes Program Recommendations  AACE/ADA: New Consensus Statement on Inpatient Glycemic Control (2015)  Target Ranges:  Prepandial:   less than 140 mg/dL      Peak postprandial:   less than 180 mg/dL (1-2 hours)      Critically ill patients:  140 - 180 mg/dL   Lab Results  Component Value Date   GLUCAP 195 (H) 10/22/2016   HGBA1C 5.8 (H) 10/16/2016    Review of Glycemic Control  Diabetes history: DM, CKD  Outpatient Diabetes medications: Toujeo 30 units daily  Current orders for Inpatient glycemic control: Novolog 0-15 units Q4H, NPO  Inpatient Diabetes Program Recommendations:  Noted patient has been extubated.  Will evaluate 4/5 to see if postprandial CBG's are elevated.  Thank you,  Windy Carina, RN, MSN Diabetes Coordinator Inpatient Diabetes Program [phone] (Team Pager)

## 2016-10-22 NOTE — Progress Notes (Signed)
OT Cancellation Note  Patient Details Name: Ralph Lopez MRN: [id number] DOB: Jan 08, 1951   Cancelled Treatment:    Reason Eval/Treat Not Completed: Medical issues which prohibited therapy.  Plan for extubation today.  Will hold OT and check back.  [facility], OTR/L [phone]   Lucille Passy M 10/22/2016, 10:02 AM

## 2016-10-22 NOTE — Progress Notes (Signed)
Occupational Therapy Treatment Patient Details Name: Ralph Lopez MRN: [id number] DOB: 02/16/1951 Today's Date: 10/22/2016    History of present illness Pt is a 66 y.o. male who presents to ED on 10/15/16 after being found down with R-side weakness and aphasia at home. 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 CXR 3/28showed multiple R rib fxs. On 3/29, had R CCA and SCA thromboembolectomy. CXR 3/30 showed R basilar pneumothorax and emphysema with chest tube placement. Pt remains intubated (failed extubation with resp arrest on 3//30)  Extubated 10/22/16. 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     OT comments  Pt demonstrates improved balance, and ability to participate in ADLs.  He requires min A, overall.  He is somewhat impulsive and communication deficits makes accurate cognitive assessment difficult.  Recommend HHOT and 24 hour assist.   Follow Up Recommendations  Home health OT;Supervision/Assistance - 24 hour    Equipment Recommendations  3 in 1 bedside commode;Tub/shower bench    Recommendations for Other Services      Precautions / Restrictions Precautions Precautions: Fall Precaution Comments: chest tube Restrictions Weight Bearing Restrictions: No       Mobility Bed Mobility Overal bed mobility: Needs Assistance Bed Mobility: Supine to Sit     Supine to sit: Min assist;+2 for safety/equipment     General bed mobility comments: verbal cues to bring LE to EOB and sit up. minA for balance upon sitting up. Pt complains of dizziness in seated with BP 123/76 and dizziness resolved within 2 minutes. minA for line management and safety as pt impulsively tried to stand entangled in chest tube and bed rail.   Transfers Overall transfer level: Needs assistance Equipment used: None Transfers: Sit to/from  Stand Sit to Stand: Min guard         General transfer comment: no physical assist required to power up from bed, however, minA to steady upon standing. verbal cues for steps toward chair    Balance Overall balance assessment: Needs assistance Sitting-balance support: Feet supported Sitting balance-Leahy Scale: Good Sitting balance - Comments: pt able to don/doff socks with no LOB    Standing balance support: No upper extremity supported Standing balance-Leahy Scale: Fair Standing balance comment: min guard assist for static standing                            ADL either performed or assessed with clinical judgement   ADL Overall ADL's : Needs assistance/impaired Eating/Feeding: NPO   Grooming: Wash/dry hands;Wash/dry face;Oral care;Brushing hair;Supervision/safety;Sitting;Set up               Lower Body Dressing: Minimal assistance;Sit to/from stand   Toilet Transfer: Min guard;Stand-pivot;BSC   Toileting- Water quality scientist and Hygiene: Min guard;Sit to/from stand       Functional mobility during ADLs: Min guard;Minimal assistance       Vision   Vision Assessment?: Yes Eye Alignment: Within Functional Limits Ocular Range of Motion: Within Functional Limits Alignment/Gaze Preference: Within Defined Limits Tracking/Visual Pursuits: Able to track stimulus in all quads without difficulty Visual Fields: No apparent deficits Additional Comments: Pt able to read clock on wall correctly    Perception     Praxis      Cognition Arousal/Alertness: Awake/alert Behavior During Therapy: Impulsive Overall Cognitive Status: Impaired/Different from baseline Area of  Impairment: Orientation;Attention;Memory;Following commands;Safety/judgement;Problem solving                 Orientation Level: Disoriented to;Situation Current Attention Level: Selective Memory: Decreased short-term memory Following Commands: Follows one step commands  consistently Safety/Judgement: Decreased awareness of safety;Decreased awareness of deficits   Problem Solving: Requires verbal cues General Comments: Pt with communication deficits making full cognitive assessment difficult.  He is impulsive and perseverates on needing water despite being instructed several times that SLP will see him after 4        Exercises General Exercises - Lower Extremity Ankle Circles/Pumps: AROM;Both;10 reps;Supine Quad Sets: Strengthening;Both;10 reps;Supine Hip ABduction/ADduction: AROM;Both;10 reps;Standing Straight Leg Raises: Strengthening;Both;10 reps;Supine Hip Flexion/Marching: AROM;Both;10 reps;Standing   Shoulder Instructions       General Comments      Pertinent Vitals/ Pain       Pain Assessment: Faces Pain Score: 3  Faces Pain Scale: No hurt Pain Location: L knee (reports h/o gout in L knee) Pain Descriptors / Indicators: Constant Pain Intervention(s): Limited activity within patient's tolerance;Monitored during session;Repositioned  Home Living                                          Prior Functioning/Environment              Frequency  Min 2X/week        Progress Toward Goals  OT Goals(current goals can now be found in the care plan section)  Progress towards OT goals: Progressing toward goals  Acute Rehab OT Goals Patient Stated Goal: get back home  Plan Discharge plan needs to be updated;Equipment recommendations need to be updated    Co-evaluation                 End of Session Equipment Utilized During Treatment: Oxygen  OT Visit Diagnosis: Unsteadiness on feet (R26.81)   Activity Tolerance Patient tolerated treatment well   Patient Left in chair;with call bell/phone within reach   Nurse Communication Mobility status        Time: 1547-1600 OT Time Calculation (min): 13 min  Charges: OT General Charges $OT Visit: 1 Procedure OT Treatments $Self Care/Home Management : 8-22  mins  [facility], OTR/L [phone]    Lucille Passy M 10/22/2016, 4:44 PM

## 2016-10-22 NOTE — Procedures (Signed)
Extubation Procedure Note  Patient Details:   Name: Ralph Lopez DOB: 1950/12/20 MRN: [id number]   Airway Documentation:     Evaluation  O2 sats: stable throughout and currently acceptable Complications: No apparent complications Patient did tolerate procedure well. Bilateral Breath Sounds: Clear, Diminished   Yes  Miquel Dunn 10/22/2016, 12:08 PM

## 2016-10-22 NOTE — Progress Notes (Signed)
qPhysical Therapy Treatment Patient Details Name: Ralph Lopez MRN: [id number] DOB: 17-May-1951 Today's Date: 10/22/2016    History of Present Illness Pt is a 66 y.o. male who presents to ED on 10/15/16 after being found down with R-side weakness and aphasia at home. 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 CXR 3/28showed multiple R rib fxs. On 3/29, had R CCA and SCA thromboembolectomy. CXR 3/30 showed R basilar pneumothorax and emphysema with chest tube placement. Pt remains intubated (failed extubation with resp arrest on 3//30). 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      PT Comments    Pt progressing well toward goals and tolerated OOB mobility. Pt limited and visibly frustrated by aphasia. Pt requires multimodal cues for mobility due to cognitive deficits for safety. Pt highly motivated to return home and family is very supportive. Recommend d/c to CIR to address impaired balance, coordination, cognition and activity tolerance for safe transition home. PT will continue to follow.    Follow Up Recommendations  CIR;Supervision/Assistance - 24 hour     Equipment Recommendations  None recommended by PT    Recommendations for Other Services Rehab consult     Precautions / Restrictions Precautions Precautions: Fall Precaution Comments: chest tube Restrictions Weight Bearing Restrictions: No    Mobility  Bed Mobility Overal bed mobility: Needs Assistance Bed Mobility: Supine to Sit     Supine to sit: Min assist;+2 for safety/equipment     General bed mobility comments: verbal cues to bring LE to EOB and sit up. minA for balance upon sitting up. Pt complains of dizziness in seated with BP 123/76 and dizziness resolved within 2 minutes. minA for line management and safety as pt impulsively tried to stand entangled in  chest tube and bed rail.   Transfers Overall transfer level: Needs assistance Equipment used: 1 person hand held assist Transfers: Sit to/from Stand Sit to Stand: Min assist;+2 safety/equipment         General transfer comment: no physical assist required to power up from bed, however, minA to steady upon standing. verbal cues for steps toward chair  Ambulation/Gait             General Gait Details: limited to steps to chair    Stairs            Wheelchair Mobility    Modified Rankin (Stroke Patients Only) Modified Rankin (Stroke Patients Only) Pre-Morbid Rankin Score: No symptoms Modified Rankin: Moderately severe disability     Balance Overall balance assessment: Needs assistance Sitting-balance support: Feet supported;No upper extremity supported Sitting balance-Leahy Scale: Fair Sitting balance - Comments: sat EOB x 2 min   Standing balance support: Single extremity supported;During functional activity Standing balance-Leahy Scale: Poor Standing balance comment: reliant on physical assist to prevent LOB                            Cognition Arousal/Alertness: Awake/alert Behavior During Therapy: Impulsive;Agitated Overall Cognitive Status: Impaired/Different from baseline Area of Impairment: Orientation;Attention;Memory;Following commands;Safety/judgement;Problem solving                 Orientation Level: Disoriented to;Situation Current Attention Level: Selective Memory: Decreased short-term memory Following Commands: Follows one step commands consistently Safety/Judgement: Decreased awareness of safety;Decreased awareness of deficits   Problem Solving: Slow processing;Requires verbal cues;Requires tactile  cues General Comments: disoriented to situation despite reorientation. Decreased short term memory requiring multimodal cues to stay on taks. Able to communicate, however, sometimes inappropriate/unintelligble words and pt visibly  frustrated.       Exercises General Exercises - Lower Extremity Ankle Circles/Pumps: AROM;Both;10 reps;Supine Quad Sets: Strengthening;Both;10 reps;Supine Hip ABduction/ADduction: AROM;Both;10 reps;Standing Straight Leg Raises: Strengthening;Both;10 reps;Supine Hip Flexion/Marching: AROM;Both;10 reps;Standing    General Comments        Pertinent Vitals/Pain Pain Assessment: 0-10 Pain Score: 3  Pain Location: L knee (reports h/o gout in L knee) Pain Descriptors / Indicators: Constant Pain Intervention(s): Limited activity within patient's tolerance;Monitored during session;Repositioned    Home Living                      Prior Function            PT Goals (current goals can now be found in the care plan section) Acute Rehab PT Goals Patient Stated Goal: get back home Progress towards PT goals: Progressing toward goals    Frequency    Min 4X/week      PT Plan Current plan remains appropriate    Co-evaluation             End of Session Equipment Utilized During Treatment: Gait belt;Oxygen (2L O2) Activity Tolerance: Patient tolerated treatment well Patient left: in chair;with call bell/phone within reach Nurse Communication: Mobility status;Precautions (no chair alarm) PT Visit Diagnosis: Other abnormalities of gait and mobility (R26.89);Muscle weakness (generalized) (M62.81);Difficulty in walking, not elsewhere classified (R26.2);Other symptoms and signs involving the nervous system (R29.898)     Time: 0488-8916 PT Time Calculation (min) (ACUTE ONLY): 19 min  Charges:                       G Codes:       Tracie Harrier, SPT Acute Rehab SPT [phone]     Tracie Harrier 10/22/2016, 4:14 PM

## 2016-10-22 NOTE — Progress Notes (Signed)
  Progress Note    10/22/2016 7:50 AM 6 Days Post-Op  Subjective:  No acute issues, remains intubated  Vitals:   10/22/16 0600 10/22/16 0700  BP: 111/76 120/65  Pulse: 95 97  Resp: 14 14  Temp:      Physical Exam: Intubated, easily follows commands R supraclavicular incision without hematoma 2+ right brachial pulse Moving all 4 extremities without limitation  CBC    Component Value Date/Time   WBC 8.6 10/22/2016 0420   RBC 3.45 (L) 10/22/2016 0420   HGB 9.9 (L) 10/22/2016 0420   HGB 12.8 (L) 04/25/2014 0407   HCT 30.6 (L) 10/22/2016 0420   HCT 33.6 (L) 07/07/2016 0950   PLT 269 10/22/2016 0420   PLT 329 07/07/2016 0950   MCV 88.7 10/22/2016 0420   MCV 86 07/07/2016 0950   MCV 96 04/25/2014 0407   MCH 28.7 10/22/2016 0420   MCHC 32.4 10/22/2016 0420   RDW 14.3 10/22/2016 0420   RDW 19.0 (H) 07/07/2016 0950   RDW 13.3 04/25/2014 0407   LYMPHSABS 0.9 10/22/2016 0420   LYMPHSABS 0.5 (L) 07/07/2016 0950   LYMPHSABS 0.6 (L) 04/25/2014 0407   MONOABS 1.2 (H) 10/22/2016 0420   MONOABS 0.6 04/25/2014 0407   EOSABS 0.0 10/22/2016 0420   EOSABS 0.0 04/25/2014 0407   BASOSABS 0.0 10/22/2016 0420   BASOSABS 0.0 04/25/2014 0407    BMET    Component Value Date/Time   NA 141 10/22/2016 0420   NA 141 07/07/2016 0950   NA 139 04/25/2014 0407   K 3.4 (L) 10/22/2016 0420   K 3.6 04/25/2014 0407   CL 100 (L) 10/22/2016 0420   CL 111 (H) 04/25/2014 0407   CO2 32 10/22/2016 0420   CO2 24 04/25/2014 0407   GLUCOSE 144 (H) 10/22/2016 0420   GLUCOSE 177 (H) 04/25/2014 0407   BUN 38 (H) 10/22/2016 0420   BUN 21 07/07/2016 0950   BUN 29 (H) 04/25/2014 0407   CREATININE 0.83 10/22/2016 0420   CREATININE 1.50 (H) 04/25/2014 0407   CALCIUM 8.9 10/22/2016 0420   CALCIUM 8.4 (L) 04/25/2014 0407   GFRNONAA >60 10/22/2016 0420   GFRNONAA 43 (L) 04/24/2014 0052   GFRAA >60 10/22/2016 0420   GFRAA 52 (L) 04/24/2014 0052    INR    Component Value Date/Time   INR 1.09  10/15/2016 1143     Intake/Output Summary (Last 24 hours) at 10/22/16 0750 Last data filed at 10/22/16 0700  Gross per 24 hour  Intake          3057.54 ml  Output             6920 ml  Net         -3862.46 ml     Assessment/Plan:66 y.o.maleis s/p R carotid and subclavian embolectomy, neuro in tact. Path returned thrombus with nothing out of ordinary. On aspirin with plan for anticoagulation when extubated. Will follow.       Aston Lieske C. Donzetta Matters, MD [facility] Office: [phone] Pager: [phone]  10/22/2016 7:50 AM

## 2016-10-22 NOTE — Progress Notes (Signed)
PT Cancellation Note  Patient Details Name: Ralph Lopez MRN: [id number] DOB: Sep 11, 1950   Cancelled Treatment:    Reason Eval/Treat Not Completed: Medical issues which prohibited therapy. RN states pt to be extubated and deferred PT today. PT will follow up as able.   Tracie Harrier, [place] Acute Rehab SPT [phone]   Tracie Harrier 10/22/2016, 8:41 AM

## 2016-10-22 NOTE — Progress Notes (Signed)
[place] [facility] Attending Note  Presenting HPI:  66 y.o. male with PMH of DM, HTN, HLD, CKD, COPD, Stage 4 Lung CA, and Depression. Of note patient is being treated with carboplatin and Taxol. Has had radiation therapy with a planned PET soon. However, he has been complaining of hip pain in which he was told was due to a pelvic mass. Presents to ED on 3/28 as a code stroke. Wife reports seeing him at 0300 in his normal state when he got up to go to the bathroom. Later in the morning wife found him on the ground of the kitchen aphasic. 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 Upon arrival to the ED CT revealed a large left MCA. Patient was intubated and then underwent a left common carotid arteriogram followed followed by complete revascularization. PCCM asked to consult for vent management.  Subjective:  No acute events overnight. Patient diuresed well overnight. Patient denies any pain or difficulty breathing. Patient denies any nausea.  Review of Systems:  Unable to obtain given intubation.   Vent Mode: PSV;CPAP FiO2 (%):  [30 %] 30 % Set Rate:  [14 bmp] 14 bmp Vt Set:  [500 mL] 500 mL PEEP:  [0 cmH20-5 cmH20] 5 cmH20 Pressure Support:  [0 cmH20-8 cmH20] 5 cmH20 Plateau Pressure:  [18 cmH20-21 cmH20] 21 cmH20  Temp:  [97.2 F (36.2 C)-98.9 F (37.2 C)] 97.2 F (36.2 C) (04/04 0808) Pulse Rate:  [35-114] 99 (04/04 1100) Resp:  [13-26] 18 (04/04 1100) BP: (91-135)/(48-103) 107/62 (04/04 1100) SpO2:  [95 %-100 %] 99 % (04/04 1100) FiO2 (%):  [30 %] 30 % (04/04 1100) Weight:  [160 lb 15 oz (73 kg)] 160 lb 15 oz (73 kg) (04/04 0400)  Gen.: Patient watching TV. Multiple family members present. Integument: Warm and dry. Without rash on exposed skin. HEENT: Endotracheal tube in place. No scleral icterus or injection. Neurological: Moving all 4 extremities equally. Nods to questions. Attends to voice. Pulmonary: Good aeration bilaterally. Normal work of  breathing on pressure support 0/0. Symmetric chest wall expansion. Cardiac S1: Regular rate. Regular rhythm. No edema. Abdomen: Soft. Nondistended. Normal bowel sounds.  LINES/TUBES: OETT 7.5 3/28 - 3/30; 3/30 >>> OGT 3/30 >>> R CHEST TUBE >>> PIV  CBC Latest Ref Rng & Units 10/22/2016 10/21/2016 10/20/2016  WBC 4.0 - 10.5 K/uL 8.6 5.7 7.2  Hemoglobin 13.0 - 17.0 g/dL 9.9(L) 9.4(L) 9.7(L)  Hematocrit 39.0 - 52.0 % 30.6(L) 28.5(L) 30.6(L)  Platelets 150 - 400 K/uL 269 250 279    BMP Latest Ref Rng & Units 10/22/2016 10/21/2016 10/20/2016  Glucose 65 - 99 mg/dL 144(H) 229(H) 251(H)  BUN 6 - 20 mg/dL 38(H) 40(H) 27(H)  Creatinine 0.61 - 1.24 mg/dL 0.83 0.89 0.79  BUN/Creat Ratio 10 - 24 - - -  Sodium 135 - 145 mmol/L 141 143 142  Potassium 3.5 - 5.1 mmol/L 3.4(L) 3.6 4.2  Chloride 101 - 111 mmol/L 100(L) 107 107  CO2 22 - 32 mmol/L 32 23 27  Calcium 8.9 - 10.3 mg/dL 8.9 9.0 9.3   Hepatic Function Latest Ref Rng & Units 10/22/2016 10/21/2016 10/15/2016  Total Protein 6.5 - 8.1 g/dL - - 7.0  Albumin 3.5 - 5.0 g/dL 2.2(L) 2.2(L) 3.0(L)  AST 15 - 41 U/L - - 18  ALT 17 - 63 U/L - - 8(L)  Alk Phosphatase 38 - 126 U/L - - 53  Total Bilirubin 0.3 - 1.2 mg/dL - - 0.5  IMAGING/STUDIES: CTA HEAD/NECK 3/28: 1. Thrombus in the distal left M1 segment to the bifurcation. Flow is present in reduced caliber left MCA branch vessels. The thrombus is likely nonocclusive. Collateral vessels are also present. 2. Saddle embolus at the innominate artery bifurcation into the right common carotid artery and right subclavian artery. This embolus is nonocclusive. 3. Atherosclerotic changes at the carotid bifurcations and cavernous internal carotid arteries bilaterally without significant stenosis. 4. Atherosclerotic calcifications with moderate stenosis in the proximal left vertebral artery. MRI BRAIN W/O 3/29: Left MCA distribution diffusion restriction is present in the left insula, left anterior temporal lobe, and  left lateral frontal lobe in distribution similar to CT perfusion CBF less than 30%. There is a small additional area of diffusion restriction within the left parietal lobe not indicated on the perfusion study. Findings are compatible with acute infarction. No associated hemorrhage identified. CT CHEST W/O 3/30: 1. Moderate-sized RIGHT pneumothorax as described on radiograph same day. 2. 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 3. Endotracheal tube in good position. 4. RIGHT suprahilar mass. 5. Bilateral round pulmonary metastasis significantly enlarged from prior PET-CT scan. PORT CXR 4/1: Previously reviewed by me. No appreciable right pneumothorax. Right-sided chest tube in place. Right hilar mass noted. Endotracheal tube and enteric feeding tube in good position. PORT CXR 4/4:  Personally reviewed by me. Right hilar mass and left lung masses noted. No pleural effusion appreciated. Endotracheal tube in stable position.  MICROBIOLOGY: MRSA PCR 3/28: Negative   ANTIBIOTICS: None.  SIGNIFICANT EVENTS: 03/28 - Admit w/ revascularization for L MCA CVA 03/30 - Extubated & had respiratory arrest that was <52mn w/ CPR & no drugs. Awake post arrest per report.  ASSESSMENT/PLAN:  66y.o. male with stage IV lung cancer admitted with acute left MCA CVA post revascularization. Respiratory status continuing to improve with excellent diuresis in the last 24 hours. Reattempting spontaneous breathing trial today with probable extubation.  1. Acute hypoxic respiratory failure: Tolerated diuresis well. Attempting spontaneous breathing trial on pressure support 0/0 with possible extubation. 2. Left MCA CVA: Post revascularization. Management per neurology. Continuing aspirin and Lipitor via tube. 3. Right subclavian artery embolus: Atypical cells within the clot itself but without clear evidence of malignancy. 4. Stage IV lung cancer: Known pelvic mass.  Status post chemotherapy. 5. Right pneumothorax: Continuing chest tube to suction. Monitoring with x-ray and bili. 6. Possible COPD exacerbation: Continuing Solu-Medrol IV daily & Duonebs every 6 hours. 7. Essential hypertension: Continuing to monitor vitals per unit protocol. Improved with diuresis. 8. History of hyperlipidemia: Continuing Lipitor. 9. Chronic renal failure: Monitoring urine output with Foley catheter. Trending renal function and electrolytes daily. 10. Hypokalemia:  Mild. Monitoring electrolytes closely & holding on further diuresis.  11. History of dysphagia: Plan for speech evaluation postextubation. 12. Hyperglycemia: Stable. Likely secondary to steroids. Improving. Continuing Lantus 70 units subcutaneous twice a day & sliding scale insulin with Accu-Cheks every 4 hours.  Prophylaxis: SCDs & Protonix IV daily. Diet: Nothing by mouth. Tube feedings on hold pending possible extubation. Code Status: Full CODE STATUS. Disposition: Remains in the intensive care unit as he is endotracheally intubated. Family Update: Family updated at bedside during rounds by me this morning.  I have personally spent a total of 33 minutes of critical care time today caring for the patient, updating family at bedside, & reviewing the aptient's electronic medical record.  JSonia BallerNAshok Cordia M.D. [facility] Pager:  [phone] 3pm or  if no response, call [phone] 11:49 AM 10/22/16

## 2016-10-23 ENCOUNTER — Inpatient Hospital Stay (HOSPITAL_COMMUNITY): Payer: BLUE CROSS/BLUE SHIELD

## 2016-10-23 ENCOUNTER — Ambulatory Visit: Payer: BLUE CROSS/BLUE SHIELD

## 2016-10-23 DIAGNOSIS — I639 Cerebral infarction, unspecified: Secondary | ICD-10-CM

## 2016-10-23 LAB — GLUCOSE, CAPILLARY
Glucose-Capillary: 90 mg/dL (ref 65–99)
Glucose-Capillary: 85 mg/dL (ref 65–99)
Glucose-Capillary: 181 mg/dL — ABNORMAL HIGH (ref 65–99)
Glucose-Capillary: 198 mg/dL — ABNORMAL HIGH (ref 65–99)
Glucose-Capillary: 203 mg/dL — ABNORMAL HIGH (ref 65–99)

## 2016-10-23 LAB — RENAL FUNCTION PANEL
ANION GAP: 9 (ref 5–15)
Albumin: 2.4 g/dL — ABNORMAL LOW (ref 3.5–5.0)
BUN: 29 mg/dL — AB (ref 6–20)
CO2: 32 mmol/L (ref 22–32)
CREATININE: 0.78 mg/dL (ref 0.61–1.24)
Calcium: 9.4 mg/dL (ref 8.9–10.3)
Chloride: 99 mmol/L — ABNORMAL LOW (ref 101–111)
GFR calc Af Amer: >60 mL/min (ref >60)
GFR calc non Af Amer: >60 mL/min (ref >60)
GLUCOSE: 94 mg/dL (ref 65–99)
POTASSIUM: 3.7 mmol/L (ref 3.5–5.1)
Phosphorus: 3.5 mg/dL (ref 2.5–4.6)
Sodium: 140 mmol/L (ref 135–145)

## 2016-10-23 LAB — CBC WITH DIFFERENTIAL/PLATELET
Basophils Absolute: 0 10*3/uL (ref 0.0–0.1)
Basophils Relative: 0 %
EOS PCT: 0 %
Eosinophils Absolute: 0 10*3/uL (ref 0.0–0.7)
HEMATOCRIT: 31.6 % — AB (ref 39.0–52.0)
Hemoglobin: 10.1 g/dL — ABNORMAL LOW (ref 13.0–17.0)
LYMPHS ABS: 1.1 10*3/uL (ref 0.7–4.0)
Lymphocytes Relative: 10 %
MCH: 28.7 pg (ref 26.0–34.0)
MCHC: 32 g/dL (ref 30.0–36.0)
MCV: 89.8 fL (ref 78.0–100.0)
MONO ABS: 0.8 10*3/uL (ref 0.1–1.0)
Monocytes Relative: 8 %
NEUTROS ABS: 8.6 10*3/uL — AB (ref 1.7–7.7)
Neutrophils Relative %: 82 %
PLATELETS: 275 10*3/uL (ref 150–400)
RBC: 3.52 MIL/uL — AB (ref 4.22–5.81)
RDW: 14.4 % (ref 11.5–15.5)
WBC: 10.5 10*3/uL (ref 4.0–10.5)

## 2016-10-23 LAB — BASIC METABOLIC PANEL
Anion gap: 10 (ref 5–15)
BUN: 30 mg/dL — AB (ref 6–20)
CHLORIDE: 96 mmol/L — AB (ref 101–111)
CO2: 28 mmol/L (ref 22–32)
CREATININE: 0.94 mg/dL (ref 0.61–1.24)
Calcium: 9.3 mg/dL (ref 8.9–10.3)
GFR calc Af Amer: >60 mL/min (ref >60)
GFR calc non Af Amer: >60 mL/min (ref >60)
Glucose, Bld: 224 mg/dL — ABNORMAL HIGH (ref 65–99)
Potassium: 4.1 mmol/L (ref 3.5–5.1)
Sodium: 134 mmol/L — ABNORMAL LOW (ref 135–145)

## 2016-10-23 LAB — MAGNESIUM
Magnesium: 1.8 mg/dL (ref 1.7–2.4)
Magnesium: 2.2 mg/dL (ref 1.7–2.4)

## 2016-10-23 LAB — PROTIME-INR
INR: 1.12
Prothrombin Time: 14.5 seconds (ref 11.4–15.2)

## 2016-10-23 LAB — HEPARIN LEVEL (UNFRACTIONATED)

## 2016-10-23 MED ORDER — CYCLOBENZAPRINE HCL 10 MG PO TABS
5.0000 mg | ORAL_TABLET | Freq: Three times a day (TID) | ORAL | Status: DC | PRN
  Administered 2016-10-23 (×2): 5 mg via ORAL
  Filled 2016-10-23 (×2): qty 1

## 2016-10-23 MED ORDER — AMIODARONE HCL IN DEXTROSE 360-4.14 MG/200ML-% IV SOLN
60.0000 mg/h | INTRAVENOUS | Status: DC
  Administered 2016-10-23: 60 mg/h via INTRAVENOUS
  Filled 2016-10-23 (×2): qty 200

## 2016-10-23 MED ORDER — FENTANYL CITRATE (PF) 100 MCG/2ML IJ SOLN
25.0000 ug | INTRAMUSCULAR | Status: DC | PRN
  Administered 2016-10-23 (×2): 100 ug via INTRAVENOUS
  Administered 2016-10-23: 50 ug via INTRAVENOUS
  Administered 2016-10-23 – 2016-10-24 (×8): 100 ug via INTRAVENOUS
  Filled 2016-10-23 (×11): qty 2

## 2016-10-23 MED ORDER — HEPARIN (PORCINE) IN NACL 100-0.45 UNIT/ML-% IJ SOLN
1550.0000 [IU]/h | INTRAMUSCULAR | Status: AC
  Administered 2016-10-23: 800 [IU]/h via INTRAVENOUS
  Filled 2016-10-23 (×4): qty 250

## 2016-10-23 MED ORDER — DOCUSATE SODIUM 50 MG/5ML PO LIQD: 100.0000 mg | Freq: Two times a day (BID) | ORAL | Status: DC | PRN

## 2016-10-23 MED ORDER — MAGNESIUM SULFATE IN D5W 1-5 GM/100ML-% IV SOLN
1.0000 g | Freq: Once | INTRAVENOUS | Status: AC
  Administered 2016-10-23: 1 g via INTRAVENOUS
  Filled 2016-10-23: qty 100

## 2016-10-23 MED ORDER — AMIODARONE HCL IN DEXTROSE 360-4.14 MG/200ML-% IV SOLN
30.0000 mg/h | INTRAVENOUS | Status: DC
  Administered 2016-10-24 – 2016-10-25 (×3): 30 mg/h via INTRAVENOUS
  Filled 2016-10-23 (×4): qty 200

## 2016-10-23 MED ORDER — WARFARIN - PHARMACIST DOSING INPATIENT: Freq: Every day | Status: DC

## 2016-10-23 MED ORDER — ATORVASTATIN CALCIUM 10 MG PO TABS
10.0000 mg | ORAL_TABLET | Freq: Every day | ORAL | Status: DC
  Administered 2016-10-23 – 2016-10-27 (×5): 10 mg via ORAL
  Filled 2016-10-23 (×5): qty 1

## 2016-10-23 MED ORDER — PREDNISONE 20 MG PO TABS
40.0000 mg | ORAL_TABLET | Freq: Every day | ORAL | Status: DC
  Administered 2016-10-23 – 2016-10-27 (×5): 40 mg via ORAL
  Filled 2016-10-23 (×6): qty 2

## 2016-10-23 MED ORDER — AMIODARONE LOAD VIA INFUSION
150.0000 mg | Freq: Once | INTRAVENOUS | Status: AC
  Administered 2016-10-23: 150 mg via INTRAVENOUS
  Filled 2016-10-23: qty 83.34

## 2016-10-23 MED ORDER — ASPIRIN 81 MG PO CHEW
324.0000 mg | CHEWABLE_TABLET | Freq: Every day | ORAL | Status: DC
  Administered 2016-10-23: 324 mg via ORAL
  Filled 2016-10-23: qty 4

## 2016-10-23 MED ORDER — WARFARIN SODIUM 7.5 MG PO TABS
7.5000 mg | ORAL_TABLET | Freq: Once | ORAL | Status: AC
  Administered 2016-10-23: 7.5 mg via ORAL
  Filled 2016-10-23: qty 1

## 2016-10-23 MED ORDER — METOPROLOL TARTRATE 5 MG/5ML IV SOLN
2.5000 mg | Freq: Once | INTRAVENOUS | Status: AC
  Administered 2016-10-23: 2.5 mg via INTRAVENOUS
  Filled 2016-10-23: qty 5

## 2016-10-23 MED ORDER — PANTOPRAZOLE SODIUM 40 MG PO PACK
40.0000 mg | PACK | Freq: Every day | ORAL | Status: DC
  Administered 2016-10-23: 40 mg via ORAL
  Filled 2016-10-23 (×2): qty 20

## 2016-10-23 NOTE — Care Management Note (Signed)
Case Management Note  Patient Details  Name: NEHAN FLAUM MRN: [id number] Date of Birth: [birth year]  Subjective/Objective:   Pt admitted on 10/15/16 with Lt MCA infarct and multiple rib fx.  PTA, pt independent, lives with spouse.                 Action/Plan:  PT recommending CIR; will follow for discharge planning as pt progresses.     Expected Discharge Date:                  Expected Discharge Plan:  [place]  In-House Referral:     Discharge planning Services  CM Consult  Post Acute Care Choice:    Choice offered to:     DME Arranged:    DME Agency:     HH Arranged:    [place] Agency:     Status of Service:  In process, will continue to follow  If discussed at Long Length of Stay Meetings, dates discussed:    Additional Comments:  Reinaldo Raddle, RN, BSN  Trauma/Neuro ICU Case Manager [phone]

## 2016-10-23 NOTE — Progress Notes (Signed)
[place] [facility] Attending Note  Presenting HPI:  66 y.o. male with PMH of DM, HTN, HLD, CKD, COPD, Stage 4 Lung CA, and Depression. Of note patient is being treated with carboplatin and Taxol. Has had radiation therapy with a planned PET soon. However, he has been complaining of hip pain in which he was told was due to a pelvic mass. Presents to ED on 3/28 as a code stroke. Wife reports seeing him at 0300 in his normal state when he got up to go to the bathroom. Later in the morning wife found him on the ground of the kitchen aphasic. 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 Upon arrival to the ED CT revealed a large left MCA. Patient was intubated and then underwent a left common carotid arteriogram followed followed by complete revascularization. PCCM asked to consult for vent management.  Subjective:  Patient extubated yesterday. Patient denying any pain or difficulty breathing. Difficult to obtain accurate history given altered mentation and aphasia.  Review of Systems:  Unable to obtain given aphasia and altered mentation.   Temp:  [97.9 F (36.6 C)-99 F (37.2 C)] 98.2 F (36.8 C) (04/05 0400) Pulse Rate:  [96-111] 98 (04/05 0700) Resp:  [17-23] 21 (04/05 0700) BP: (91-154)/(62-92) 102/67 (04/05 0700) SpO2:  [94 %-100 %] 97 % (04/05 0700) FiO2 (%):  [30 %] 30 % (04/04 1100) Weight:  [155 lb 6.8 oz (70.5 kg)] 155 lb 6.8 oz (70.5 kg) (04/05 0447)  Gen.: Patient awake. Lights off. Watching TV. Integument: No rash on exposed skin. Right chest wall dressing around tube clean and dry. HEENT: Cranial nerves grossly intact. No scleral icterus. Moist mucous membranes. Pulmonary: Good aeration & clear bilaterally with auscultation. No accessory muscle use on room air. Cardiovascular: Regular rate. No edema. No JVD. Neurological: Following commands. Strength appears 5/5 and symmetric grossly. Moving all 4 extremities. Patient answering inappropriately to  president and year. Aware that he is in [place] but cannot tell me that he is in hospital. Does have some expressive aphasia probably.  LINES/TUBES: OETT 7.5 3/28 - 3/30; 3/30 - 4/4 OGT 3/30 - 4/4 R CHEST TUBE >>> PIV  CBC Latest Ref Rng & Units 10/23/2016 10/22/2016 10/21/2016  WBC 4.0 - 10.5 K/uL 10.5 8.6 5.7  Hemoglobin 13.0 - 17.0 g/dL 10.1(L) 9.9(L) 9.4(L)  Hematocrit 39.0 - 52.0 % 31.6(L) 30.6(L) 28.5(L)  Platelets 150 - 400 K/uL 275 269 250    BMP Latest Ref Rng & Units 10/23/2016 10/22/2016 10/21/2016  Glucose 65 - 99 mg/dL 94 144(H) 229(H)  BUN 6 - 20 mg/dL 29(H) 38(H) 40(H)  Creatinine 0.61 - 1.24 mg/dL 0.78 0.83 0.89  BUN/Creat Ratio 10 - 24 - - -  Sodium 135 - 145 mmol/L 140 141 143  Potassium 3.5 - 5.1 mmol/L 3.7 3.4(L) 3.6  Chloride 101 - 111 mmol/L 99(L) 100(L) 107  CO2 22 - 32 mmol/L 32 32 23  Calcium 8.9 - 10.3 mg/dL 9.4 8.9 9.0   Hepatic Function Latest Ref Rng & Units 10/23/2016 10/22/2016 10/21/2016  Total Protein 6.5 - 8.1 g/dL - - -  Albumin 3.5 - 5.0 g/dL 2.4(L) 2.2(L) 2.2(L)  AST 15 - 41 U/L - - -  ALT 17 - 63 U/L - - -  Alk Phosphatase 38 - 126 U/L - - -  Total Bilirubin 0.3 - 1.2 mg/dL - - -    IMAGING/STUDIES: CTA HEAD/NECK 3/28: 1. Thrombus in the distal left M1 segment to the bifurcation.  Flow is present in reduced caliber left MCA branch vessels. The thrombus is likely nonocclusive. Collateral vessels are also present. 2. Saddle embolus at the innominate artery bifurcation into the right common carotid artery and right subclavian artery. This embolus is nonocclusive. 3. Atherosclerotic changes at the carotid bifurcations and cavernous internal carotid arteries bilaterally without significant stenosis. 4. Atherosclerotic calcifications with moderate stenosis in the proximal left vertebral artery. MRI BRAIN W/O 3/29: Left MCA distribution diffusion restriction is present in the left insula, left anterior temporal lobe, and left lateral frontal lobe in  distribution similar to CT perfusion CBF less than 30%. There is a small additional area of diffusion restriction within the left parietal lobe not indicated on the perfusion study. Findings are compatible with acute infarction. No associated hemorrhage identified. CT CHEST W/O 3/30: 1. Moderate-sized RIGHT pneumothorax as described on radiograph same day. 2. 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 3. Endotracheal tube in good position. 4. RIGHT suprahilar mass. 5. Bilateral round pulmonary metastasis significantly enlarged from prior PET-CT scan. PORT CXR 4/1: Previously reviewed by me. No appreciable right pneumothorax. Right-sided chest tube in place. Right hilar mass noted. Endotracheal tube and enteric feeding tube in good position. PORT CXR 4/4:  Previously reviewed by me. Right hilar mass and left lung masses noted. No pleural effusion appreciated. Endotracheal tube in stable position.  MICROBIOLOGY: MRSA PCR 3/28: Negative   ANTIBIOTICS: None.  SIGNIFICANT EVENTS: 03/28 - Admit w/ revascularization for L MCA CVA 03/30 - Extubated & had respiratory arrest that was <42mn w/ CPR & no drugs. Awake post arrest per report. 04/05 - Starting clamping trials with right chest tube  ASSESSMENT/PLAN:  66y.o. male with stage IV lung cancer and left MCA CVA post revascularization. Patient successfully extubated yesterday with continued improvement in respiratory status. Appears to have an expressive aphasia today. I am starting clamping trials on his right chest tube. Chest tube previously was on waterseal yesterday.   1. Acute hypoxic respiratory failure: Resolved. Currently tolerating room air. 2. Left MCA CVA: Post revascularization. Appears to have some expressive aphasia. Continuing aspirin and Lipitor. 3. Right subclavian artery embolus: Atypical cells but no obvious malignancy. 4. Right pneumothorax initiating clamping trials on  chest tube. Plan to repeat portable chest x-ray this afternoon at 5 PM and again tomorrow morning at 5 AM. If patient tolerates could consider removal of chest tube tomorrow. 5. Stage IV lung cancer: Known pelvic mass. Post chemotherapy. Plan for future radiation therapy. 6. Possible COPD exacerbation: Continuing Duonebs every 6 hours. Switching to prednisone 40 mg by mouth daily. 7. Essential hypertension: Monitoring vitals per unit protocol. Currently normotensive post diuresis. 8. History of hyperlipidemia: Continuing Lipitor. 9. Chronic renal failure/recent urinary retention: Stable. Continuing to monitor urine output with Foley catheter. Trending renal function and lites daily. 10. Hypokalemia: Resolved. 11. History of dysphagia: Speech evaluation complete & speech continuing to follow. 12. Hyperglycemia: Likely secondary to steroids. Glucose stable. Discontinuing Lantus. Continuing Accu-Cheks every 4 hours with sliding scale insulin per algorithm.  Prophylaxis: SCDs & Protonix IV daily. Diet:  Speech following & guiding diet recommendations.  Code Status: Full CODE STATUS. Disposition: As per primary service. Family Update: Patient updated during rounds today.   I have spent a total of 38 minutes of time today caring for the patient, reviewing the patient's electronic medical record, and with more than 50% of that time spent coordinating care with the patient as well as reviewing the continuing  plan of care with the patient and his nurse at bedside.  Remainder of care as per primary service.   Sonia Baller Ashok Cordia, M.D. [facility] Pager:  [phone] After 3pm or if no response, call [phone] 8:17 AM 10/23/16

## 2016-10-23 NOTE — Progress Notes (Signed)
qPhysical Therapy Treatment Patient Details Name: Ralph Lopez MRN: [id number] DOB: April 28, 1951 Today's Date: 10/23/2016    History of Present Illness Pt is a 66 y.o. male who presents to ED on 10/15/16 after being found down with R-side weakness and aphasia at home. 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 CXR 3/28showed multiple R rib fxs. On 3/29, had R CCA and SCA thromboembolectomy. CXR 3/30 showed R basilar pneumothorax and emphysema with chest tube placement. Pt remains intubated (failed extubation with resp arrest on 3//30)  Extubated 10/22/16. 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      PT Comments    Pt progressing well with mobility, able to amb in hallway with RW and min guard for balance. Continues to be impulsive with movement, but able to follow commands to wait for line/lead management before moving. SpO2 > 86% with amb; pt educ on breathing technique. Per discussion with family, pt will have 24-7 assist upon return to home and plans to amb with RW secondary to previous doctor recommendation. Will continue to follow acutely with d/c rec updated to HHPT.    Follow Up Recommendations  Home health PT;Supervision for mobility/OOB     Equipment Recommendations  None recommended by PT    Recommendations for Other Services       Precautions / Restrictions Precautions Precautions: Fall Precaution Comments: chest tube Restrictions Weight Bearing Restrictions: No    Mobility  Bed Mobility Overal bed mobility: Needs Assistance Bed Mobility: Supine to Sit     Supine to sit: Min guard     General bed mobility comments: Min guard for balance; verbal cues to wait for lines/leads to be in order before getting up in bed. No c/o dizziness with sitting.   Transfers Overall transfer level: Needs  assistance Equipment used: Rolling walker (2 wheeled) Transfers: Sit to/from Stand Sit to Stand: Min guard         General transfer comment: Min guard for balance, pt required to physical assist to stand.   Ambulation/Gait Ambulation/Gait assistance: Min guard Ambulation Distance (Feet): 150 Feet Assistive device: Rolling walker (2 wheeled) Gait Pattern/deviations: Step-through pattern;Decreased stride length Gait velocity: Decreased   General Gait Details: Amb with RW and min guard for balance; SpO2 down to low 80s with amb. Pt educ on breathing technique.    Stairs            Wheelchair Mobility    Modified Rankin (Stroke Patients Only) Modified Rankin (Stroke Patients Only) Pre-Morbid Rankin Score: No symptoms Modified Rankin: Moderately severe disability     Balance Overall balance assessment: Needs assistance Sitting-balance support: Feet supported Sitting balance-Leahy Scale: Good     Standing balance support: Bilateral upper extremity supported;During functional activity Standing balance-Leahy Scale: Poor                              Cognition Arousal/Alertness: Awake/alert Behavior During Therapy: Impulsive Overall Cognitive Status: Impaired/Different from baseline Area of Impairment: Safety/judgement;Problem solving                         Safety/Judgement: Decreased awareness of deficits;Decreased awareness of safety   Problem Solving: Requires verbal cues General Comments: Pt demonstrates improved speech, although continues to have difficulty with word finding, occasionally requiring increased time  to respond to question. Impulsive with movement, but able to follow commands with verbal cues for safety regarding lines/leads.       Exercises      General Comments        Pertinent Vitals/Pain Pain Score: 10-Worst pain ever Pain Location: L knee Pain Descriptors / Indicators: Grimacing Pain Intervention(s): Monitored  during session    Home Living                      Prior Function            PT Goals (current goals can now be found in the care plan section) Acute Rehab PT Goals Patient Stated Goal: get back home PT Goal Formulation: With patient/family Time For Goal Achievement: 11/03/16 Potential to Achieve Goals: Good Progress towards PT goals: Progressing toward goals    Frequency    Min 4X/week      PT Plan Discharge plan needs to be updated    Co-evaluation             End of Session Equipment Utilized During Treatment: Gait belt Activity Tolerance: Patient tolerated treatment well Patient left: in bed;with call bell/phone within reach;with bed alarm set;with family/visitor present Nurse Communication: Mobility status PT Visit Diagnosis: Other abnormalities of gait and mobility (R26.89);Muscle weakness (generalized) (M62.81);Difficulty in walking, not elsewhere classified (R26.2);Other symptoms and signs involving the nervous system (R29.898)     Time: 7416-3845 PT Time Calculation (min) (ACUTE ONLY): 19 min  Charges:  $Gait Training: 8-22 mins                    G Codes:      Enis Gash, SPT [phone]  Mabeline Caras 10/23/2016, 4:02 PM

## 2016-10-23 NOTE — Progress Notes (Addendum)
ANTICOAGULATION CONSULT NOTE  Pharmacy Consult for Heparin > warfarin Indication: aterial thrombus, new CVA  No Known Allergies  Patient Measurements: Height: '5\' 10"'$  (177.8 cm) (recorded 12/17) Weight: 155 lb 6.8 oz (70.5 kg) IBW/kg (Calculated) : 73   Vital Signs: Temp: 97.4 F (36.3 C) (04/05 1200) Temp Source: Oral (04/05 1200) BP: 112/82 (04/05 1400) Pulse Rate: 101 (04/05 1400)  Labs:  Recent Labs  10/21/16 0300 10/22/16 0420 10/23/16 0305  HGB 9.4* 9.9* 10.1*  HCT 28.5* 30.6* 31.6*  PLT 250 269 275  CREATININE 0.89 0.83 0.78    Estimated Creatinine Clearance: 91.8 mL/min (by C-G formula based on SCr of 0.78 mg/dL).  Assessment: 38 yom admitted with acute L MCA CVA, saddle arterial embolus s/p R carotid and subclavian embolectomy. Pharmacy consulted to dose heparin bridge + warfarin for arterial thrombus. Baseline INR 1.12. Hg low stable, plt wnl, no bleed documented.  Goal of Therapy:  Heparin level 0.3-0.5 units/ml Monitor platelets by anticoagulation protocol: Yes   Plan:  Heparin at 800 units / hr - d/c when INR therapeutic >24hr Warfarin 7.'5mg'$  PO x 1 8 hour heparin level Daily heparin level, INR, CBC Monitor for s/sx bleeding  Elicia Lamp, PharmD, BCPS Clinical Pharmacist 10/23/2016 2:54 PM

## 2016-10-23 NOTE — Progress Notes (Signed)
  Progress Note    10/23/2016 11:58 AM 7 Days Post-Op  Subjective:  No acute issues  Vitals:   10/23/16 0700 10/23/16 0800  BP: 102/67   Pulse: 98   Resp: (!) 21   Temp:  99.5 F (37.5 C)    Physical Exam: aaox3 R supraclavicular incision without hematoma 2+ right brachial/radial pulses R hand strength 5/5  CBC    Component Value Date/Time   WBC 10.5 10/23/2016 0305   RBC 3.52 (L) 10/23/2016 0305   HGB 10.1 (L) 10/23/2016 0305   HGB 12.8 (L) 04/25/2014 0407   HCT 31.6 (L) 10/23/2016 0305   HCT 33.6 (L) 07/07/2016 0950   PLT 275 10/23/2016 0305   PLT 329 07/07/2016 0950   MCV 89.8 10/23/2016 0305   MCV 86 07/07/2016 0950   MCV 96 04/25/2014 0407   MCH 28.7 10/23/2016 0305   MCHC 32.0 10/23/2016 0305   RDW 14.4 10/23/2016 0305   RDW 19.0 (H) 07/07/2016 0950   RDW 13.3 04/25/2014 0407   LYMPHSABS 1.1 10/23/2016 0305   LYMPHSABS 0.5 (L) 07/07/2016 0950   LYMPHSABS 0.6 (L) 04/25/2014 0407   MONOABS 0.8 10/23/2016 0305   MONOABS 0.6 04/25/2014 0407   EOSABS 0.0 10/23/2016 0305   EOSABS 0.0 04/25/2014 0407   BASOSABS 0.0 10/23/2016 0305   BASOSABS 0.0 04/25/2014 0407    BMET    Component Value Date/Time   NA 140 10/23/2016 0305   NA 141 07/07/2016 0950   NA 139 04/25/2014 0407   K 3.7 10/23/2016 0305   K 3.6 04/25/2014 0407   CL 99 (L) 10/23/2016 0305   CL 111 (H) 04/25/2014 0407   CO2 32 10/23/2016 0305   CO2 24 04/25/2014 0407   GLUCOSE 94 10/23/2016 0305   GLUCOSE 177 (H) 04/25/2014 0407   BUN 29 (H) 10/23/2016 0305   BUN 21 07/07/2016 0950   BUN 29 (H) 04/25/2014 0407   CREATININE 0.78 10/23/2016 0305   CREATININE 1.50 (H) 04/25/2014 0407   CALCIUM 9.4 10/23/2016 0305   CALCIUM 8.4 (L) 04/25/2014 0407   GFRNONAA >60 10/23/2016 0305   GFRNONAA 43 (L) 04/24/2014 0052   GFRAA >60 10/23/2016 0305   GFRAA 52 (L) 04/24/2014 0052    INR    Component Value Date/Time   INR 1.09 10/15/2016 1143     Intake/Output Summary (Last 24 hours) at  10/23/16 1158 Last data filed at 10/23/16 0600  Gross per 24 hour  Intake               95 ml  Output             3110 ml  Net            -3015 ml      Assessment/Plan:65 y.o.maleis s/p R carotid and subclavian embolectomy, neuro in tact. On aspirin with plan for anticoagulation per neuro. Following.    Reynaldo Rossman C. Donzetta Matters, MD [facility] Office: [phone] Pager: [phone]  10/23/2016 11:58 AM

## 2016-10-23 NOTE — Progress Notes (Signed)
STROKE TEAM PROGRESS NOTE   SUBJECTIVE (INTERVAL HISTORY) Neurologically continues to do great. He had chest tube removed y`day and is breathing well so far.  No family at bedside OBJECTIVE Temp:  [97.4 F (36.3 C)-99.5 F (37.5 C)] 97.4 F (36.3 C) (04/05 1200) Pulse Rate:  [96-111] 101 (04/05 1400) Cardiac Rhythm: Normal sinus rhythm (04/05 0800) Resp:  [17-31] 27 (04/05 1400) BP: (77-146)/(50-92) 112/82 (04/05 1400) SpO2:  [93 %-100 %] 93 % (04/05 1400) Weight:  [155 lb 6.8 oz (70.5 kg)] 155 lb 6.8 oz (70.5 kg) (04/05 0447)   Recent Labs Lab 10/22/16 1941 10/22/16 2319 10/23/16 0341 10/23/16 0758 10/23/16 1140  GLUCAP 156* 86 90 85 181*    Recent Labs Lab 10/18/16 1826 10/19/16 0455 10/19/16 0700 10/20/16 0449 10/21/16 0300 10/22/16 0420 10/23/16 0305  NA  --  141  --  142 143 141 140  K  --  3.7  --  4.2 3.6 3.4* 3.7  CL  --  108  --  107 107 100* 99*  CO2  --  25  --  27 23 32 32  GLUCOSE  --  308*  --  251* 229* 144* 94  BUN  --  25*  --  27* 40* 38* 29*  CREATININE  --  0.84  --  0.79 0.89 0.83 0.78  CALCIUM  --  8.9  --  9.3 9.0 8.9 9.4  MG 2.0  --  1.9 1.9 1.8 1.7 1.8  PHOS 1.5*  --  2.0*  --  1.5* 3.3 3.5    Recent Labs Lab 10/21/16 0300 10/22/16 0420 10/23/16 0305  ALBUMIN 2.2* 2.2* 2.4*    Recent Labs Lab 10/19/16 0455 10/20/16 0449 10/21/16 0300 10/22/16 0420 10/23/16 0305  WBC 8.3 7.2 5.7 8.6 10.5  NEUTROABS  --   --  3.6 6.5 8.6*  HGB 8.9* 9.7* 9.4* 9.9* 10.1*  HCT 27.3* 30.6* 28.5* 30.6* 31.6*  MCV 88.6 89.7 90.2 88.7 89.8  PLT 231 279 250 269 275   No results for input(s): LABPROT, INR in the last 72 hours. No results for input(s): COLORURINE, LABSPEC, [place], GLUCOSEU, HGBUR, BILIRUBINUR, KETONESUR, PROTEINUR, UROBILINOGEN, NITRITE, LEUKOCYTESUR in the last 72 hours.  Invalid input(s): APPERANCEUR     Component Value Date/Time   CHOL 112 10/16/2016 0557   CHOL 153 01/02/2016 0952   CHOL 135 07/03/2015 1055   TRIG 86  10/22/2016 0420   TRIG 94 07/03/2015 1055   HDL 25 (L) 10/16/2016 0557   HDL 30 (L) 01/02/2016 0952   CHOLHDL 4.5 10/16/2016 0557   VLDL 28 10/16/2016 0557   VLDL 19 07/03/2015 1055   LDLCALC 59 10/16/2016 0557   LDLCALC 97 01/02/2016 0952   Lab Results  Component Value Date   HGBA1C 5.8 (H) 10/16/2016      Component Value Date/Time   LABOPIA NONE DETECTED 10/15/2016 1720   COCAINSCRNUR NONE DETECTED 10/15/2016 1720   LABBENZ NONE DETECTED 10/15/2016 1720   AMPHETMU NONE DETECTED 10/15/2016 1720   THCU NONE DETECTED 10/15/2016 1720   LABBARB NONE DETECTED 10/15/2016 1720    No results for input(s): ETH in the last 168 hours.   10/10/2016 : Status post endovascular complete revascularization of left MCA occlusion with 1 pass with the Solitaire FR 4 mm x 40 mm retrieval device achieving a TICI 3 reperfusion.  2D Echocardiogram  10/17/2016 Study Conclusions - Left ventricle: The cavity size was normal. Systolic function was   vigorous. The estimated ejection fraction was  in the range of 65%   to 70%. Wall motion was normal; there were no regional wall   motion abnormalities. Doppler parameters are consistent with   abnormal left ventricular relaxation (grade 1 diastolic   dysfunction). - Aortic valve: Trileaflet; normal thickness leaflets. There was no   regurgitation. - Aortic root: The aortic root was normal in size. - Mitral valve: Structurally normal valve. - Left atrium: The atrium was normal in size. - Right ventricle: Systolic function was normal. - Tricuspid valve: There was no regurgitation. - Pulmonary arteries: Systolic pressure could not be accurately   estimated. - Inferior vena cava: The vessel was normal in size. Impressions: - Impaired relaxation with normal filling pressures, otherwise   normal study.   EKG sinus rhythm. For complete results please see formal report.  PHYSICAL EXAM Vitals:   10/23/16 1100 10/23/16 1200 10/23/16 1300 10/23/16 1400   BP: 100/63 (!) 77/50 118/73 112/82  Pulse: (!) 107 (!) 102 (!) 101 (!) 101  Resp: (!) 25 (!) 24 (!) 21 (!) 27  Temp:  97.4 F (36.3 C)    TempSrc:  Oral    SpO2: 98% 100% 96% 93%  Weight:      Height:       General: Vital signs reviewed.  Patient is Chronically ill appearing, appears older than stated age, intubated in no acute distress and cooperative with exam.  Cardiovascular: RRR Pulmonary/Chest: Clear to auscultation, no wheezes  Abdominal: Soft, non-tender, non-distended, BS +.  Extremities: No lower extremity edema bilaterally, pulses symmetric and intact bilaterally.   Neurological Exam :Awake, alert.  . Patient moves all extremities.well equally without any focal weakness Normal sensation throughout.    Following commands, with some difficultly with peripheral commands. Extraocular muscles intact, PERRLA.no focal weakness.Deep tendon reflexes are symmetric. Plantars are downgoing.  ASSESSMENT/PLAN Mr. Ralph Lopez is a 66 y.o. male with history of hypertension, type 2 diabetes, hyperlipidemia, CKD, COPD, stage IV lung cancer undergoing active treatment admitted for achalasia, right sided weakness and facial droop and found to have an acute left MCA CVA status post revascularization.   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   CT Perfusion: The acute infarct involving the left MCA territory with estimated volume of CT a less than 30% at 43 mL. Mismatch volume 131 mL.  CT Angio head/neck: Thrombus in the distal left M1 segment to the bifurcation. Flow is present in reduced caliber left MCA branch vessels. The thrombus is likely nonocclusive. Saddle embolus at the innominate artery bifurcation into the right common carotid artery and right subclavian artery.  This embolus is nonocclusive.  CT Head: Improvement in left MCA hypodensity following clot retrieval.   S/p Right CC and R subclavian artery thrombectomy with removal of large gelatinous single embolus with vascular surgery on 3/30- unclear if blood clot or tissue- sent to pathology  Echo: 32-20%, grade 1 diastolic, no evidence of vegetation  LDL 59  HgbA1c 5.8  Heparin for VTE prophylaxis  DIET DYS 3 Room service appropriate? Yes; Fluid consistency: Thin   aspirin 81 mg daily prior to admission, now on aspirin 325 mg daily  Patient will be counseled to be compliant with his antithrombotic medications  Ongoing aggressive stroke risk factor management  Therapy recommendations:  CLR  Disposition:  Pending  Left Common Carotid Thrombus Non-Occlusive s/p Revascularization with IR Right Common Carotid Thrombus and Right Subclavian Thrombus and Stenosis Non-Occlusive  CT Angio head/neck: Thrombus in the distal left M1 segment to the bifurcation. Flow is present in reduced caliber left MCA branch vessels. The thrombus is likely nonocclusive. Saddle embolus at the innominate artery bifurcation into the right common carotid artery and right subclavian artery. This embolus is nonocclusive.  S/p Right CC and R subclavian artery thrombectomy with removal of large gelatinous single embolus with vascular surgery on 3/30- unclear if blood clot or tissue- sent to pathology  Off anticoagulation - will need to resume anticoagulation at some point.  Diabetes  5.8, goal < 7.0  Currently on SSI  CBG monitoring  DM education  Hypertension  Home meds:   Amlodipine 5 mg daily, atenolol 100 mg daily, lisinopril 40 mg daily  Keep systolic blood pressure 191-660  Patient will be counseled to be compliant with his blood pressure medications  Hyperlipidemia  Home meds:  Atorvastatin 10 mg daily  Currently on atorvastatin 10 mg daily  LDL 59, goal < 70  Continue statin at  discharge  Other Stroke Risk Factors  History of tobacco abuse  Active malignancy   Other Active Problems  Chronic normocytic anemia-hemoglobin 8.9  Mild leukocytosis -> resolved - temp 99.1  Cardiac arrest 2/2 to respiratory arrest during extubation- now re-intubated.  Right pneumothorax -> chest tube -> no pneumothorax on chest x-ray today 10/19/2016   Other Pertinent History  Stage IV Lung Cancer: Has been treated with carboplatin and Taxol, given new evidence of metastasis to left hip, plans are to start radiation therapy   Hospital day # 8 Plan will start iv heparin and transition to warfarin and get pharmacy to dose.d/w Dr Ashok Cordia I have personally examined this patient, reviewed notes, independently viewed imaging studies, participated in medical decision making and plan of care.ROS completed by me personally and pertinent positives fully documented  I have made any additions or clarifications directly to the above note. Discussed with Dr. Ashok Cordia.   This patient is critically ill and at significant risk of neurological worsening, death and care requires constant monitoring of vital signs, hemodynamics,respiratory and cardiac monitoring, extensive review of multiple databases, frequent neurological assessment, discussion with family, other specialists and medical decision making of high complexity.I have made any additions or clarifications directly to the above note.This critical care time does not reflect procedure time, or teaching time or supervisory time of PA/NP/Med Resident etc but could involve care discussion time.  I spent 30 minutes of neurocritical care time  in the care of  this patient.      Antony Contras, MD Medical Director [facility] Pager: [phone] 10/23/2016 2:35 PM   To contact Stroke Continuity provider, please refer to [email or web address] After hours, contact General Neurology

## 2016-10-23 NOTE — Progress Notes (Signed)
eLink Physician-Brief Progress Note Patient Name: Ralph Lopez DOB: Dec 02, 1950 MRN: [id number]   Date of Service  10/23/2016  HPI/Events of Note  AFIB / AFLUTTER - BP = 76/57.   eICU Interventions  Will order: 1. Amiodarone IV load and infusion. 2. BMP and Mg++ level STAT.     Intervention Category Major Interventions: Arrhythmia - evaluation and management  Bassem Bernasconi Cornelia Copa 10/23/2016, 3:38 PM

## 2016-10-23 NOTE — Progress Notes (Signed)
ANTICOAGULATION CONSULT NOTE - Initial Consult  Pharmacy Consult for Heparin Indication: stroke (with plans for transition to Eliquis)  No Known Allergies  Patient Measurements: Height: '5\' 10"'$  (177.8 cm) (recorded 12/17) Weight: 155 lb 6.8 oz (70.5 kg) IBW/kg (Calculated) : 73   Vital Signs: Temp: 99.5 F (37.5 C) (04/05 0800) Temp Source: Oral (04/05 0800) BP: 102/67 (04/05 0700) Pulse Rate: 98 (04/05 0700)  Labs:  Recent Labs  10/21/16 0300 10/22/16 0420 10/23/16 0305  HGB 9.4* 9.9* 10.1*  HCT 28.5* 30.6* 31.6*  PLT 250 269 275  CREATININE 0.89 0.83 0.78    Estimated Creatinine Clearance: 91.8 mL/min (by C-G formula based on SCr of 0.78 mg/dL).  Assessment: 66 year old male with new CVA and lung cancer to transition to Eliquis on discharge.  To begin heparin for now pending further workup  Goal of Therapy:  Heparin level 0.3-0.5 units/ml Monitor platelets by anticoagulation protocol: Yes   Plan:  Heparin at 800 units / hr 8 hour heparin level Daily heparin level, CBC  Thank you Anette Guarneri, PharmD [phone]  10/23/2016,11:21 AM

## 2016-10-23 NOTE — Progress Notes (Signed)
  Speech Language Pathology Treatment: Dysphagia  Patient Details Name: Ralph Lopez MRN: [id number] DOB: December 25, 1950 Today's Date: 10/23/2016 Time: 9833-8250 SLP Time Calculation (min) (ACUTE ONLY): 18 min  Assessment / Plan / Recommendation Clinical Impression  SLP followed up for PO readiness. Pt alert and communicatory this date, though still exhibiting expressive language difficulty, ST cognitive linguistic evaluation indicated to further assess, MD please consider ordering. OME unremarkable. Pt with mildly prolonged mastication of solid PO, likely impacted by loosely fitting dentures. NO overt s/sx of aspiration were evidenced across PO trials this date. Prior symptoms noted upon previous ST encounter likely secondary post recent extubation. Recommend diet initiation of dysphagia 3 (mechanical soft) and thin liquids with medicines whole in puree and full supervision with all PO. ST to continue to closely monitor to ensure diet tolerance and initiate Speech Languae Evaluation at MD discretion.     HPI HPI: Pt is a 66 y.o. male who presents to ED on 10/15/16 after being found down with R-side weakness and aphasia at home. 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 CXR 3/28 showed multiple R rib fxs. On 3/29, had R CCA and SCA thromboembolectomy. CXR 3/30 showed R basilar pneumothorax and emphysema with chest tube placement. Pt was  intubated 3/28 (failed extubation with resp arrest on 3/30) , extubated 10/22/16. 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       SLP Plan  Continue with current plan of care       Recommendations  Diet recommendations: Dysphagia 3 (mechanical soft);Thin liquid Liquids provided via: Cup Medication Administration: Whole meds with puree Supervision: Patient able to self feed;Full supervision/cueing for  compensatory strategies Compensations: Slow rate;Small sips/bites Postural Changes and/or Swallow Maneuvers: Seated upright 90 degrees;Upright 30-60 min after meal                Oral Care Recommendations: Oral care BID Follow up Recommendations: 24 hour supervision/assistance;Home health SLP SLP Visit Diagnosis: Dysphagia, unspecified (R13.10) Plan: Continue with current plan of care       [place] MA, [place] 10/23/2016, 9:10 AM

## 2016-10-23 NOTE — Progress Notes (Signed)
ANTICOAGULATION CONSULT NOTE  Pharmacy Consult for Heparin > warfarin Indication: aterial thrombus, new CVA  No Known Allergies  Patient Measurements: Height: '5\' 10"'$  (177.8 cm) (recorded 12/17) Weight: 155 lb 6.8 oz (70.5 kg) IBW/kg (Calculated) : 73   Vital Signs: Temp: 98 F (36.7 C) (04/05 1930) Temp Source: Oral (04/05 1930) BP: 108/63 (04/05 2100) Pulse Rate: 94 (04/05 2100)  Labs:  Recent Labs  10/21/16 0300 10/22/16 0420 10/23/16 0305 10/23/16 1522 10/23/16 1708 10/23/16 2020  HGB 9.4* 9.9* 10.1*  --   --   --   HCT 28.5* 30.6* 31.6*  --   --   --   PLT 250 269 275  --   --   --   LABPROT  --   --   --  14.5  --   --   INR  --   --   --  1.12  --   --   HEPARINUNFRC  --   --   --   --   --  <0.10*  CREATININE 0.89 0.83 0.78  --  0.94  --     Estimated Creatinine Clearance: 78.1 mL/min (by C-G formula based on SCr of 0.94 mg/dL).  Assessment: 49 yom admitted with acute L MCA CVA, saddle arterial embolus s/p R carotid and subclavian embolectomy. Pharmacy consulted to dose heparin bridge + warfarin for arterial thrombus. Baseline INR 1.12. Hg low stable, plt wnl, no bleed documented. Heparin drip 800 uts/hr - no problems with line per RN  HL 0.1 < goal.   Goal of Therapy:  Heparin level 0.3-0.5 units/ml Monitor platelets by anticoagulation protocol: Yes   Plan:  Increase Heparin at 950 units / hr - d/c when INR therapeutic >24hr Daily heparin level, INR, CBC Monitor for s/sx bleeding  Bonnita Nasuti Pharm.D. CPP, BCPS Clinical Pharmacist [phone] 10/23/2016 9:44 PM

## 2016-10-23 NOTE — Consult Note (Signed)
Physical Medicine and Rehabilitation Consult   Reason for Consult: Difficulty speaking and weakness Referring Physician: Dr. Leonie Man.    HPI: Ralph Lopez is a 66 y.o. male with history of T2DM, COPD, stage IV lung cancer--undergoing chemo with carboplatin/taxol, hip pain due to pelvic mass; who was admitted on 10/15/16 after found down with aphasia and right sided weakness. CT perfusion with acute infarct involving L-MCA and he underwent cerebral angio with complete revascularization of occluded L-MCA with solitare device and noted to have prominent filling defect across R-CCA and R-VA suspicious for large thrombus and possible underlying stenosis.  BLE dopplers negative for DVT.  Dr. Donzetta Matters consulted and patient underwent R-CCA and SA thromboembolectomy on 3/29. He was extubated briefly and coded requiring CPR and reintubation and Neo-synephrine. CT chest revealed moderate right pneumothorax with associated right lateral rib fractures and right chest tube placed at bedside.  RUE duplex without DVT. He was diuresed for acute hypoxic respiratory failure and tolerated extubation on  4/4. Therapy ongoing and patient limited by aphasia, weakness with impaired balance affecting mobility. NPO due to concerns of aspiration. CIR recommended for follow up therapy.   Patient is awake and alert. During the exam, his heart rate went up to 167 briefly and then came down to120-130 range Blood pressure on monitor 85/51, denies dizziness  Patient states that he was in the middle of chemotherapy treatments for his lung carcinoma  Patient wishes to go home as soon as possible  Review of Systems  HENT: Negative for hearing loss and tinnitus.   Eyes: Negative for blurred vision and double vision.  Respiratory: Negative for cough, shortness of breath and wheezing.   Cardiovascular: Negative for chest pain, palpitations and leg swelling.  Gastrointestinal: Negative for heartburn and nausea.  Genitourinary:  Negative for frequency and urgency.  Musculoskeletal: Positive for joint pain (hip). Negative for back pain and myalgias.  Skin: Negative for itching and rash.  Neurological: Positive for speech change. Negative for dizziness, focal weakness and headaches.  Psychiatric/Behavioral: Negative for depression. The patient is not nervous/anxious.       Past Medical History:  Diagnosis Date  . Anxiety   . Cancer ([place])    Stage IV Lung  . Chronic kidney disease   . COPD (chronic obstructive pulmonary disease) ([place])   . Depression   . Diabetes mellitus without complication ([place])   . Hyperlipidemia   . Hypertension     Past Surgical History:  Procedure Laterality Date  . CAROTID-SUBCLAVIAN BYPASS GRAFT Right 10/16/2016   Procedure: RIGHT CAROTID-SUBCLAVIAN THROMBECTOMY;  Surgeon: Waynetta Sandy, MD;  Location: [facility];  Service: Vascular;  Laterality: Right;  . EYE SURGERY    . FLEXIBLE BRONCHOSCOPY N/A 04/28/2016   Procedure: FLEXIBLE BRONCHOSCOPY;  Surgeon: [name], MD;  Location: [facility];  Service: Pulmonary;  Laterality: N/A;  . IR GENERIC HISTORICAL  10/15/2016   IR PERCUTANEOUS ART THROMBECTOMY/INFUSION INTRACRANIAL INC DIAG ANGIO 10/15/2016 Luanne Bras, MD [facility]  . IR GENERIC HISTORICAL  10/15/2016   IR ANGIOGRAM EXTREMITY RIGHT 10/15/2016 Luanne Bras, MD [facility]  . IR GENERIC HISTORICAL  10/15/2016   IR ANGIO INTRA EXTRACRAN SEL COM CAROTID INNOMINATE UNI R MOD SED 10/15/2016 Luanne Bras, MD [facility]  . MANDIBLE SURGERY    . RADIOLOGY WITH ANESTHESIA N/A 10/15/2016   Procedure: RADIOLOGY WITH ANESTHESIA;  Surgeon: Luanne Bras, MD;  Location: [facility];  Service: Radiology;  Laterality: N/A;    Family History  Problem Relation Age of Onset  . Diabetes Mother   . Hypertension Mother   . Breast cancer Mother   . Thrombosis Mother   . Heart disease Father     MI  . Breast cancer Sister   . Colon cancer Brother   . Lung cancer  Brother     Social History:  Married. Independent PTA--recently instructed to use walker due to hip mass. Independent PTA. He reports that he quit smoking about 2 years ago. He quit after 20.00 years of use. He has never used smokeless tobacco. He reports that he does not drink alcohol or use drugs.    Allergies: No Known Allergies    Medications Prior to Admission  Medication Sig Dispense Refill  . albuterol (PROVENTIL HFA;VENTOLIN HFA) 108 (90 Base) MCG/ACT inhaler Inhale 2 puffs into the lungs every 6 (six) hours as needed for wheezing or shortness of breath.    Marland Kitchen amLODipine (NORVASC) 5 MG tablet Take 1 tablet (5 mg total) by mouth daily. 90 tablet 3  . aspirin EC 81 MG tablet Take 81 mg by mouth daily.    Marland Kitchen atenolol (TENORMIN) 100 MG tablet TAKE ONE TABLET BY MOUTH ONCE DAILY 90 tablet 0  . atorvastatin (LIPITOR) 10 MG tablet Take 10 mg by mouth daily.    Marland Kitchen BYDUREON 2 MG PEN INJECT '2MG'$  SUBCUTANEOUSLY WEEKLY 4 each 2  . Insulin Glargine (TOUJEO SOLOSTAR) 300 UNIT/ML SOPN Inject 30 Units as directed daily. 4 pen 6  . lisinopril (PRINIVIL,ZESTRIL) 40 MG tablet Take 40 mg by mouth daily.    Marland Kitchen LORazepam (ATIVAN) 0.5 MG tablet Take 1 tablet (0.5 mg total) by mouth 2 (two) times daily. 60 tablet 0  . ondansetron (ZOFRAN) 8 MG tablet Take 1 tablet by mouth 2 (two) times daily as needed.    . Oxycodone HCl 10 MG TABS Take 1 tablet (10 mg total) by mouth every 4 (four) hours as needed. 90 tablet 0  . pantoprazole (PROTONIX) 40 MG tablet Take 1 tablet (40 mg total) by mouth daily. 90 tablet 3  . ranitidine (ZANTAC) 150 MG tablet Take 150 mg by mouth daily as needed for heartburn.     Marland Kitchen lisinopril (PRINIVIL,ZESTRIL) 40 MG tablet TAKE ONE TABLET BY MOUTH ONCE DAILY (Patient not taking: Reported on 10/15/2016) 90 tablet 1  . methylPREDNISolone (MEDROL DOSEPAK) 4 MG TBPK tablet As directed (Patient not taking: Reported on 10/15/2016) 21 tablet 0  . prochlorperazine (COMPAZINE) 10 MG tablet Take 1 tablet  (10 mg total) by mouth every 6 (six) hours as needed (Nausea or vomiting). (Patient not taking: Reported on 10/15/2016) 30 tablet 1  . umeclidinium-vilanterol (ANORO ELLIPTA) 62.5-25 MCG/INH AEPB Inhale 1 puff into the lungs daily. (Patient not taking: Reported on 09/04/2016) 1 each 12  . zolpidem (AMBIEN) 5 MG tablet Take 1 tablet (5 mg total) by mouth at bedtime as needed for sleep. (Patient not taking: Reported on 10/15/2016) 30 tablet 1    Home: [place] expects to be discharged to:: Private residence Living Arrangements: Spouse/significant other Available Help at Discharge: Family, Available 24 hours/day Type of Home: House Home Access: Stairs to enter [employer] of Steps: 5 Entrance Stairs-Rails: Right (R rail w/ wall on left) Home Layout: One level Bathroom Shower/Tub: Multimedia programmer: Handicapped height Home Equipment: Environmental consultant - 2 wheels, Shower seat, Bedside commode  Functional History: Prior Function Level of Independence: Independent with assistive device(s) Comments: Mod indep with intermittent use of RW (doctor recommended for  hip metastasis).  Functional Status:  Mobility: Bed Mobility Overal bed mobility: Needs Assistance Bed Mobility: Supine to Sit Supine to sit: Min assist, +2 for safety/equipment General bed mobility comments: verbal cues to bring LE to EOB and sit up. minA for balance upon sitting up. Pt complains of dizziness in seated with BP 123/76 and dizziness resolved within 2 minutes. minA for line management and safety as pt impulsively tried to stand entangled in chest tube and bed rail.  Transfers Overall transfer level: Needs assistance Equipment used: None Transfers: Sit to/from Stand Sit to Stand: Min guard General transfer comment: no physical assist required to power up from bed, however, minA to steady upon standing. verbal cues for steps toward chair Ambulation/Gait General Gait Details: limited to steps  to chair     ADL: ADL Overall ADL's : Needs assistance/impaired Eating/Feeding: NPO Grooming: Wash/dry hands, Wash/dry face, Oral care, Brushing hair, Supervision/safety, Sitting, Set up Upper Body Bathing: Moderate assistance Lower Body Bathing: Total assistance Lower Body Dressing: Minimal assistance, Sit to/from stand Toilet Transfer: Min guard, Stand-pivot, BSC Toileting- Clothing Manipulation and Hygiene: Min guard, Sit to/from stand Functional mobility during ADLs: Min guard, Minimal assistance General ADL Comments: pt attempting to mouth over vent. pt with pen and paper and writing to therapist. pt with some structural errors in sentence with spelling errors but attempting to communicate hx of L knee pain and gout  Cognition: Cognition Overall Cognitive Status: Impaired/Different from baseline Orientation Level: Oriented to person, Oriented to place, Disoriented to time, Disoriented to situation Cognition Arousal/Alertness: Awake/alert Behavior During Therapy: Impulsive Overall Cognitive Status: Impaired/Different from baseline Area of Impairment: Orientation, Attention, Memory, Following commands, Safety/judgement, Problem solving Orientation Level: Disoriented to, Situation Current Attention Level: Selective Memory: Decreased short-term memory Following Commands: Follows one step commands consistently Safety/Judgement: Decreased awareness of safety, Decreased awareness of deficits Problem Solving: Requires verbal cues General Comments: Pt with communication deficits making full cognitive assessment difficult.  He is impulsive and perseverates on needing water despite being instructed several times that SLP will see him after 4 Difficult to assess due to: Impaired communication   Blood pressure 102/67, pulse 98, temperature 99.5 F (37.5 C), temperature source Oral, resp. rate (!) 21, height '5\' 10"'$  (1.778 m), weight 70.5 kg (155 lb 6.8 oz), SpO2 99 %. Physical Exam    Nursing note and vitals reviewed. Constitutional: He is oriented to person, place, and time. He appears well-developed and well-nourished.  HENT:  Head: Normocephalic and atraumatic.  Eyes: Conjunctivae and EOM are normal. Pupils are equal, round, and reactive to light.  Neck: Normal range of motion. Neck supple.  Cardiovascular: Normal rate.   Respiratory: Effort normal. No stridor. No respiratory distress.  Right chest tube in place.   GI: Soft. Bowel sounds are normal. He exhibits no distension. There is no tenderness.  Musculoskeletal: He exhibits no tenderness.  Neurological: He is alert and oriented to person, place, and time.  Expressive deficits noted. Able to follow basic commands without difficulty.   Skin: Skin is warm and dry.  Psychiatric: He has a normal mood and affect. His behavior is normal. Judgment and thought content normal.  Motor strength is 5/5 bilateral deltoid, biceps, triceps, grip, hip flexor, knee extensor, ankle dorsiflexor. Sensation intact to light touch bilateral upper and lower limbs. Patient has decreased breath sounds in the left lung base and absent breath sounds in the right lung base  Results for orders placed or performed during the hospital encounter of 10/15/16 (from the  past 24 hour(s))  Glucose, capillary     Status: Abnormal   Collection Time: 10/22/16 11:31 AM  Result Value Ref Range   Glucose-Capillary 195 (H) 65 - 99 mg/dL   Comment 1 Notify RN    Comment 2 Document in Chart   Glucose, capillary     Status: Abnormal   Collection Time: 10/22/16  3:55 PM  Result Value Ref Range   Glucose-Capillary 250 (H) 65 - 99 mg/dL   Comment 1 Notify RN    Comment 2 Document in Chart   Glucose, capillary     Status: Abnormal   Collection Time: 10/22/16  7:41 PM  Result Value Ref Range   Glucose-Capillary 156 (H) 65 - 99 mg/dL  Glucose, capillary     Status: None   Collection Time: 10/22/16 11:19 PM  Result Value Ref Range   Glucose-Capillary 86  65 - 99 mg/dL  Renal function panel     Status: Abnormal   Collection Time: 10/23/16  3:05 AM  Result Value Ref Range   Sodium 140 135 - 145 mmol/L   Potassium 3.7 3.5 - 5.1 mmol/L   Chloride 99 (L) 101 - 111 mmol/L   CO2 32 22 - 32 mmol/L   Glucose, Bld 94 65 - 99 mg/dL   BUN 29 (H) 6 - 20 mg/dL   Creatinine, Ser 0.78 0.61 - 1.24 mg/dL   Calcium 9.4 8.9 - 10.3 mg/dL   Phosphorus 3.5 2.5 - 4.6 mg/dL   Albumin 2.4 (L) 3.5 - 5.0 g/dL   GFR calc non Af Amer >60 >60 mL/min   GFR calc Af Amer >60 >60 mL/min   Anion gap 9 5 - 15  CBC with Differential/Platelet     Status: Abnormal   Collection Time: 10/23/16  3:05 AM  Result Value Ref Range   WBC 10.5 4.0 - 10.5 K/uL   RBC 3.52 (L) 4.22 - 5.81 MIL/uL   Hemoglobin 10.1 (L) 13.0 - 17.0 g/dL   HCT 31.6 (L) 39.0 - 52.0 %   MCV 89.8 78.0 - 100.0 fL   MCH 28.7 26.0 - 34.0 pg   MCHC 32.0 30.0 - 36.0 g/dL   RDW 14.4 11.5 - 15.5 %   Platelets 275 150 - 400 K/uL   Neutrophils Relative % 82 %   Lymphocytes Relative 10 %   Monocytes Relative 8 %   Eosinophils Relative 0 %   Basophils Relative 0 %   Neutro Abs 8.6 (H) 1.7 - 7.7 K/uL   Lymphs Abs 1.1 0.7 - 4.0 K/uL   Monocytes Absolute 0.8 0.1 - 1.0 K/uL   Eosinophils Absolute 0.0 0.0 - 0.7 K/uL   Basophils Absolute 0.0 0.0 - 0.1 K/uL   RBC Morphology POLYCHROMASIA PRESENT   Magnesium     Status: None   Collection Time: 10/23/16  3:05 AM  Result Value Ref Range   Magnesium 1.8 1.7 - 2.4 mg/dL  Glucose, capillary     Status: None   Collection Time: 10/23/16  3:41 AM  Result Value Ref Range   Glucose-Capillary 90 65 - 99 mg/dL  Glucose, capillary     Status: None   Collection Time: 10/23/16  7:58 AM  Result Value Ref Range   Glucose-Capillary 85 65 - 99 mg/dL   Comment 1 Notify RN    Comment 2 Document in Chart    Dg Chest Port 1 View  Result Date: 10/22/2016 CLINICAL DATA:  Respiratory failure, known lung malignancy. History of COPD EXAM: PORTABLE  CHEST 1 VIEW COMPARISON:  Portable  chest x-ray of October 21, 2016 FINDINGS: The right hemidiaphragm is higher today than on yesterday's study. A large right hilar mass is stable. The right chest tube is in stable position. No definite right-sided pneumothorax is observed. Multiple pulmonary parenchymal masses are noted in the left mid and lower lung and at the right lung base. The heart and pulmonary vascularity are normal. The endotracheal tube tip lies 4.7 cm above the carina. The esophagogastric tube tip projects below the inferior margin of the image. IMPRESSION: Fairly stable appearance of the chest allowing for differences in positioning. Multiple bilateral pulmonary masses with dominant right hilar mass. No pneumothorax or pleural effusion. Electronically Signed   By: Othon  Martinique M.D.   On: 10/22/2016 07:06    Assessment/Plan: Diagnosis: Left MCA distribution embolic stroke, thromboemboli s/p carotid and subclavian embolectomies 1. Does the need for close, 24 hr/day medical supervision in concert with the patient's rehab needs make it unreasonable for this patient to be served in a less intensive setting? Potentially 2. Co-Morbidities requiring supervision/potential complications: Atrial fibrillation with rapid ventricular response, stage IV lung carcinoma 3. Due to bladder management, bowel management, safety, skin/wound care, disease management, medication administration, pain management and patient education, does the patient require 24 hr/day rehab nursing? Yes 4. Does the patient require coordinated care of a physician, rehab nurse, PT (1-2 hrs/day, 5 days/week), OT (1-2 hrs/day, 5 days/week) and SLP (.5-1 hrs/day, 5 days/week) to address physical and functional deficits in the context of the above medical diagnosis(es)? Yes and Potentially Addressing deficits in the following areas: balance, endurance, locomotion, strength, transferring, bowel/bladder control, bathing, dressing, feeding, grooming, toileting, swallowing and  psychosocial support 5. Can the patient actively participate in an intensive therapy program of at least 3 hrs of therapy per day at least 5 days per week? No 6. The potential for patient to make measurable gains while on inpatient rehab is NA due to inability to tolerate therapy at current time 7. Anticipated functional outcomes upon discharge from inpatient rehab are n/a  with PT, n/a with OT, n/a with SLP. 8. Estimated rehab length of stay to reach the above functional goals is: NA 9. Does the patient have adequate social supports and living environment to accommodate these discharge functional goals? Potentially 10. Anticipated D/C setting: Home 11. Anticipated post D/C treatments: HH therapy and question if pt qualifies for hospice as well 12. Overall Rehab/Functional Prognosis: fair  RECOMMENDATIONS: This patient's condition is appropriate for continued rehabilitative care in the following setting: CIR if patient still requiring physical assistance once medical status is stabilized Patient has agreed to participate in recommended program. Potentially Note that insurance prior authorization may be required for reimbursement for recommended care.  Comment: Patient is currently unable to tolerate manual muscle testing and sitting up in bed due to tachycardia, admission coordinator will follow functional and medical status.  Charlett Blake M.D. [facility] FAAPM&R (Sports Med, Neuromuscular Med) Diplomate Am Board of Electrodiagnostic Med  Flora Lipps 10/23/2016

## 2016-10-24 ENCOUNTER — Inpatient Hospital Stay (HOSPITAL_COMMUNITY): Payer: BLUE CROSS/BLUE SHIELD

## 2016-10-24 ENCOUNTER — Telehealth: Payer: Self-pay | Admitting: Vascular Surgery

## 2016-10-24 DIAGNOSIS — C801 Malignant (primary) neoplasm, unspecified: Secondary | ICD-10-CM

## 2016-10-24 DIAGNOSIS — I499 Cardiac arrhythmia, unspecified: Secondary | ICD-10-CM

## 2016-10-24 LAB — RENAL FUNCTION PANEL
ANION GAP: 12 (ref 5–15)
Albumin: 2.2 g/dL — ABNORMAL LOW (ref 3.5–5.0)
BUN: 24 mg/dL — ABNORMAL HIGH (ref 6–20)
CHLORIDE: 97 mmol/L — AB (ref 101–111)
CO2: 28 mmol/L (ref 22–32)
Calcium: 9.2 mg/dL (ref 8.9–10.3)
Creatinine, Ser: 0.89 mg/dL (ref 0.61–1.24)
GFR calc non Af Amer: >60 mL/min (ref >60)
Glucose, Bld: 100 mg/dL — ABNORMAL HIGH (ref 65–99)
POTASSIUM: 3.4 mmol/L — AB (ref 3.5–5.1)
Phosphorus: 3.5 mg/dL (ref 2.5–4.6)
Sodium: 137 mmol/L (ref 135–145)

## 2016-10-24 LAB — CBC WITH DIFFERENTIAL/PLATELET
BASOS PCT: 0 %
Basophils Absolute: 0 10*3/uL (ref 0.0–0.1)
EOS PCT: 1 %
Eosinophils Absolute: 0.2 10*3/uL (ref 0.0–0.7)
HEMATOCRIT: 31.8 % — AB (ref 39.0–52.0)
Hemoglobin: 10.4 g/dL — ABNORMAL LOW (ref 13.0–17.0)
Lymphocytes Relative: 7 %
Lymphs Abs: 1.1 10*3/uL (ref 0.7–4.0)
MCH: 28.9 pg (ref 26.0–34.0)
MCHC: 32.7 g/dL (ref 30.0–36.0)
MCV: 88.3 fL (ref 78.0–100.0)
MONOS PCT: 6 %
Monocytes Absolute: 0.9 10*3/uL (ref 0.1–1.0)
NEUTROS PCT: 86 %
Neutro Abs: 13.1 10*3/uL — ABNORMAL HIGH (ref 1.7–7.7)
Platelets: 267 10*3/uL (ref 150–400)
RBC: 3.6 MIL/uL — ABNORMAL LOW (ref 4.22–5.81)
RDW: 14.3 % (ref 11.5–15.5)
WBC: 15.3 10*3/uL — ABNORMAL HIGH (ref 4.0–10.5)

## 2016-10-24 LAB — GLUCOSE, CAPILLARY
GLUCOSE-CAPILLARY: 121 mg/dL — AB (ref 65–99)
GLUCOSE-CAPILLARY: 262 mg/dL — AB (ref 65–99)
GLUCOSE-CAPILLARY: 301 mg/dL — AB (ref 65–99)
GLUCOSE-CAPILLARY: 120 mg/dL — AB (ref 65–99)
Glucose-Capillary: 101 mg/dL — ABNORMAL HIGH (ref 65–99)
Glucose-Capillary: 114 mg/dL — ABNORMAL HIGH (ref 65–99)
Glucose-Capillary: 336 mg/dL — ABNORMAL HIGH (ref 65–99)

## 2016-10-24 LAB — HEPARIN LEVEL (UNFRACTIONATED)

## 2016-10-24 LAB — MAGNESIUM: Magnesium: 2 mg/dL (ref 1.7–2.4)

## 2016-10-24 LAB — PROTIME-INR
INR: 1.31
PROTHROMBIN TIME: 16.4 s — AB (ref 11.4–15.2)

## 2016-10-24 MED ORDER — ENSURE ENLIVE PO LIQD
237.0000 mL | Freq: Two times a day (BID) | ORAL | Status: DC
  Administered 2016-10-24 – 2016-10-27 (×7): 237 mL via ORAL
  Filled 2016-10-24: qty 237

## 2016-10-24 MED ORDER — OXYCODONE HCL 5 MG PO TABS
5.0000 mg | ORAL_TABLET | ORAL | Status: DC | PRN
  Administered 2016-10-24 – 2016-10-27 (×8): 10 mg via ORAL
  Filled 2016-10-24 (×8): qty 2

## 2016-10-24 MED ORDER — TAMSULOSIN HCL 0.4 MG PO CAPS
0.4000 mg | ORAL_CAPSULE | Freq: Every day | ORAL | Status: DC
  Administered 2016-10-24 – 2016-10-27 (×4): 0.4 mg via ORAL
  Filled 2016-10-24 (×4): qty 1

## 2016-10-24 MED ORDER — INSULIN ASPART 100 UNIT/ML ~~LOC~~ SOLN
5.0000 [IU] | Freq: Three times a day (TID) | SUBCUTANEOUS | Status: DC
  Administered 2016-10-24 – 2016-10-27 (×4): 5 [IU] via SUBCUTANEOUS

## 2016-10-24 MED ORDER — INSULIN ASPART 100 UNIT/ML ~~LOC~~ SOLN
0.0000 [IU] | Freq: Three times a day (TID) | SUBCUTANEOUS | Status: DC
  Administered 2016-10-24: 7 [IU] via SUBCUTANEOUS
  Administered 2016-10-24 – 2016-10-25 (×3): 5 [IU] via SUBCUTANEOUS
  Administered 2016-10-26: 3 [IU] via SUBCUTANEOUS
  Administered 2016-10-26: 2 [IU] via SUBCUTANEOUS
  Administered 2016-10-27: 1 [IU] via SUBCUTANEOUS

## 2016-10-24 MED ORDER — POTASSIUM CHLORIDE CRYS ER 20 MEQ PO TBCR: 40.0000 meq | EXTENDED_RELEASE_TABLET | Freq: Once | ORAL | Status: DC

## 2016-10-24 MED ORDER — WARFARIN SODIUM 7.5 MG PO TABS: 7.5000 mg | ORAL_TABLET | Freq: Once | ORAL | Status: DC

## 2016-10-24 MED ORDER — PANTOPRAZOLE SODIUM 40 MG PO TBEC
40.0000 mg | DELAYED_RELEASE_TABLET | Freq: Every day | ORAL | Status: DC
  Administered 2016-10-24 – 2016-10-27 (×4): 40 mg via ORAL
  Filled 2016-10-24 (×4): qty 1

## 2016-10-24 MED ORDER — POTASSIUM CHLORIDE CRYS ER 20 MEQ PO TBCR
40.0000 meq | EXTENDED_RELEASE_TABLET | Freq: Once | ORAL | Status: AC
  Administered 2016-10-24: 40 meq via ORAL
  Filled 2016-10-24: qty 2

## 2016-10-24 MED ORDER — INSULIN ASPART 100 UNIT/ML ~~LOC~~ SOLN
0.0000 [IU] | Freq: Every day | SUBCUTANEOUS | Status: DC
Start: 2016-10-24 — End: 2016-10-27
  Administered 2016-10-26: 2 [IU] via SUBCUTANEOUS

## 2016-10-24 NOTE — Progress Notes (Signed)
Inpatient Diabetes Program Recommendations  AACE/ADA: New Consensus Statement on Inpatient Glycemic Control (2015)  Target Ranges:  Prepandial:   less than 140 mg/dL      Peak postprandial:   less than 180 mg/dL (1-2 hours)      Critically ill patients:  140 - 180 mg/dL   Lab Results  Component Value Date   GLUCAP 262 (H) 10/24/2016   HGBA1C 5.8 (H) 10/16/2016    Review of Glycemic ControlResults for ZAYLAN, KISSOON (MRN [id number]) as of 10/24/2016 12:42  Ref. Range 10/23/2016 07:58 10/23/2016 11:40 10/23/2016 15:50 10/23/2016 19:29 10/23/2016 23:43 10/24/2016 03:32 10/24/2016 08:15 10/24/2016 11:32  Glucose-Capillary Latest Ref Range: 65 - 99 mg/dL 85 181 (H) 198 (H) 203 (H) 114 (H) 101 (H) 121 (H) 262 (H)   Diabetes history: Type 2 diabetes Outpatient Diabetes medications: Novolog sensitive tid with meals and HS, Prednisone 40 mg daily Current orders for Inpatient glycemic control:  Novolog sensitive tid with meals and HS  Inpatient Diabetes Program Recommendations:  May consider adding Novolog meal coverage 5 units tid with meals (hold if patient eats less than 50%).   Thanks, Adah Perl, RN, BC-ADM Inpatient Diabetes Coordinator Pager [phone] (8a-5p)

## 2016-10-24 NOTE — Progress Notes (Signed)
[place] [facility] Attending Note  Presenting HPI:  66 y.o. male with PMH of DM, HTN, HLD, CKD, COPD, Stage 4 Lung CA, and Depression. Of note patient is being treated with carboplatin and Taxol. Has had radiation therapy with a planned PET soon. However, he has been complaining of hip pain in which he was told was due to a pelvic mass. Presents to ED on 3/28 as a code stroke. Wife reports seeing him at 0300 in his normal state when he got up to go to the bathroom. Later in the morning wife found him on the ground of the kitchen aphasic. 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 Upon arrival to the ED CT revealed a large left MCA. Patient was intubated and then underwent a left common carotid arteriogram followed followed by complete revascularization. PCCM asked to consult for vent management.  Subjective:  No acute events. Patient continuing to have some expressive aphasia. Chest tube remained clamped overnight with no evidence of pneumothorax on my review.  Review of Systems:  Unable to obtain given aphasia.   Temp:  [97.3 F (36.3 C)-98.5 F (36.9 C)] 98.5 F (36.9 C) (04/06 0800) Pulse Rate:  [76-108] 98 (04/06 0800) Resp:  [19-28] 27 (04/06 0800) BP: (77-154)/(50-127) 121/84 (04/06 0800) SpO2:  [93 %-100 %] 100 % (04/06 0800) Weight:  [153 lb 14.1 oz (69.8 kg)] 153 lb 14.1 oz (69.8 kg) (04/06 0500)  Gen.: Awake. Lights off. Again watching TV. No family at bedside. Integument: No rash or bruising on exposed skin. Warm and dry. HEENT: No scleral icterus. Moist mucous membranes. Face appears symmetric. Cardiovascular: Regular rate. No edema or JVD appreciated. Neck signed pollen: Normal work of breathing. Clear bilaterally to auscultation. Right-sided chest tube clamped. Neurological: Following commands. Still has expressive aphasia. Strength 5/5 and grossly symmetric.  LINES/TUBES: OETT 7.5 3/28 - 3/30; 3/30 - 4/4 OGT 3/30 - 4/4 R CHEST TUBE  >>> PIV  CBC Latest Ref Rng & Units 10/24/2016 10/23/2016 10/22/2016  WBC 4.0 - 10.5 K/uL 15.3(H) 10.5 8.6  Hemoglobin 13.0 - 17.0 g/dL 10.4(L) 10.1(L) 9.9(L)  Hematocrit 39.0 - 52.0 % 31.8(L) 31.6(L) 30.6(L)  Platelets 150 - 400 K/uL 267 275 269    BMP Latest Ref Rng & Units 10/24/2016 10/23/2016 10/23/2016  Glucose 65 - 99 mg/dL 100(H) 224(H) 94  BUN 6 - 20 mg/dL 24(H) 30(H) 29(H)  Creatinine 0.61 - 1.24 mg/dL 0.89 0.94 0.78  BUN/Creat Ratio 10 - 24 - - -  Sodium 135 - 145 mmol/L 137 134(L) 140  Potassium 3.5 - 5.1 mmol/L 3.4(L) 4.1 3.7  Chloride 101 - 111 mmol/L 97(L) 96(L) 99(L)  CO2 22 - 32 mmol/L 28 28 32  Calcium 8.9 - 10.3 mg/dL 9.2 9.3 9.4   Hepatic Function Latest Ref Rng & Units 10/24/2016 10/23/2016 10/22/2016  Total Protein 6.5 - 8.1 g/dL - - -  Albumin 3.5 - 5.0 g/dL 2.2(L) 2.4(L) 2.2(L)  AST 15 - 41 U/L - - -  ALT 17 - 63 U/L - - -  Alk Phosphatase 38 - 126 U/L - - -  Total Bilirubin 0.3 - 1.2 mg/dL - - -    IMAGING/STUDIES: CTA HEAD/NECK 3/28: 1. Thrombus in the distal left M1 segment to the bifurcation. Flow is present in reduced caliber left MCA branch vessels. The thrombus is likely nonocclusive. Collateral vessels are also present. 2. Saddle embolus at the innominate artery bifurcation into the right common carotid artery and right subclavian artery.  This embolus is nonocclusive. 3. Atherosclerotic changes at the carotid bifurcations and cavernous internal carotid arteries bilaterally without significant stenosis. 4. Atherosclerotic calcifications with moderate stenosis in the proximal left vertebral artery. MRI BRAIN W/O 3/29: Left MCA distribution diffusion restriction is present in the left insula, left anterior temporal lobe, and left lateral frontal lobe in distribution similar to CT perfusion CBF less than 30%. There is a small additional area of diffusion restriction within the left parietal lobe not indicated on the perfusion study. Findings are compatible with acute  infarction. No associated hemorrhage identified. CT CHEST W/O 3/30: 1. Moderate-sized RIGHT pneumothorax as described on radiograph same day. 2. 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 3. Endotracheal tube in good position. 4. RIGHT suprahilar mass. 5. Bilateral round pulmonary metastasis significantly enlarged from prior PET-CT scan. PORT CXR 4/1: Previously reviewed by me. No appreciable right pneumothorax. Right-sided chest tube in place. Right hilar mass noted. Endotracheal tube and enteric feeding tube in good position. PORT CXR 4/4:  Previously reviewed by me. Right hilar mass and left lung masses noted. No pleural effusion appreciated. Endotracheal tube in stable position. PORT CXR 4/6:  Personally reviewed by me. Right and left lung masses noted. Continued volume loss on the right. No pneumothorax on the right. Chest tube in good position on the right.  MICROBIOLOGY: MRSA PCR 3/28: Negative   ANTIBIOTICS: None.  SIGNIFICANT EVENTS: 03/28 - Admit w/ revascularization for L MCA CVA 03/30 - Extubated & had respiratory arrest that was <38mn w/ CPR & no drugs. Awake post arrest per report. 04/05 - Starting clamping trials with right chest tube  ASSESSMENT/PLAN:  66y.o. male with stage IV lung cancer with left MCA CVA post revascularization. Patient also with right subclavian artery embolus concerning for malignant clot as well. Patient has tolerated extubation well. Continues to have some aphasia. Tolerating clamping trials with no evidence of reaccumulation of pneumothorax. Therefore, I feel it's reasonable to remove chest tube today.   1. Acute hypoxic respiratory failure: Resolved. Continuing to monitor pulse oximetry. 2. Right pneumothorax: Removing chest tube today at 2 PM. Plan for repeat portable chest x-ray at 5 PM & subsequent chest x-ray tomorrow morning at 5 AM. 3. Left MCA CVA: Post revascularization. Expressive  aphasia persists. Continuing aspirin & Lipitor. Patient started on systemic anticoagulation which is currently on hold pending chest tube removal. 4. Arrhythmia: Intermittent. Appears to be atrial fibrillation with intermittent atrial flutter. Continuous telemetry monitoring. Systemic anticoagulation to resume post chest tube removal. 5. Possible COPD exacerbation: Continuing Duonebs every 6 hours. Continuing prednisone 40 mg daily. Recommend a one-week taper of prednisone. 6. Right subclavian artery embolus: Removed by vascular surgery. Atypical cells but no obvious malignancy. 7. Stage IV lung cancer. Status post chemotherapy. Plan for radiation therapy to pelvic mass. 8. Essential hypertension: Remains normotensive after diuresis. Monitoring vitals per unit protocol. 9. Hyperlipidemia: Continuing Lipitor. 10. Chronic renal failure/urinary retention: Starting Flomax. Continuing Foley catheter for now. Trending renal function and electrolytes daily. 11. Hypokalemia: Replaced with KCl 452m PO.  Repeat electrolytes in AM.  12. History of dysphagia: Speech following. Continuing dysphagia diet. 13. Hyperglycemia: Likely secondary to steroids. Glucose control. Switching Accu-Cheks every before meals & at bedtime with sliding scale insulin for sensitive algorithm.  Prophylaxis: SCDs & Protonix IV daily. Diet:  Speech following & guiding diet recommendations - Dysphagia 3.  Code Status: Full CODE STATUS. Disposition: As per primary service. Family Update: Patient again updating during  rounds today.   I have spent a total of 36 minutes of time today caring for the patient, reviewing the patient's electronic medical record, and with more than 50% of that time spent coordinating care with the patient as well as reviewing the continuing plan of care with the patient, neurology, and nursing at bedside.  Remainder of care as per primary service. PCCM signing off 4/6. Please contact the rounding physician if  there are any further concerns or questions.   Sonia Baller Ashok Cordia, M.D. [facility] Pager:  [phone] After 3pm or if no response, call [phone] 10:05 AM 10/24/16

## 2016-10-24 NOTE — Progress Notes (Signed)
ANTICOAGULATION CONSULT NOTE - Follow Up Consult  Pharmacy Consult for heparin Indication: arterial thrombus in setting of CVA  Labs:  Recent Labs  10/22/16 0420 10/23/16 0305 10/23/16 1522 10/23/16 1708 10/23/16 2020 10/24/16 0330 10/24/16 0343  HGB 9.9* 10.1*  --   --   --   --   --   HCT 30.6* 31.6*  --   --   --   --   --   PLT 269 275  --   --   --   --   --   LABPROT  --   --  14.5  --   --  16.4*  --   INR  --   --  1.12  --   --  1.31  --   HEPARINUNFRC  --   --   --   --  <0.10*  --  <0.10*  CREATININE 0.83 0.78  --  0.94  --   --  0.89    Assessment: 66yo male remains undetectable on heparin after rate increase.  Goal of Therapy:  Heparin level 0.3-0.5 units/ml   Plan:  Will increase heparin gtt by 3 units/kg/hr to 1150 units/hr and check level in 6hr.  Wynona Neat, PharmD, BCPS  10/24/2016,5:23 AM

## 2016-10-24 NOTE — Progress Notes (Signed)
eLink Physician-Brief Progress Note Patient Name: Ralph Lopez DOB: 04-13-1951 MRN: [id number]   Date of Service  10/24/2016  HPI/Events of Note  hypokalemia  eICU Interventions  Potassium replaced     Intervention Category Minor Interventions: Electrolytes abnormality - evaluation and management  Khadija Thier 10/24/2016, 5:32 AM

## 2016-10-24 NOTE — Telephone Encounter (Signed)
-----   Message from Mena Goes, RN sent at 10/24/2016 10:14 AM EDT ----- Regarding: 2-3 weeks   ----- Message ----- From: Dario Ave Sent: 10/24/2016   9:26 AM To: [facility] Charge Pool Subject: kay                                              ----- Message ----- From: Gabriel Earing, PA-C Sent: 10/24/2016   7:49 AM To: [facility] Charge Pool  s/p R carotid and subclavian embolectomy.  f/u with Dr. Donzetta Matters in 2-3 weeks.  Thanks

## 2016-10-24 NOTE — Progress Notes (Signed)
Occupational Therapy Treatment Patient Details Name: Ralph Lopez MRN: [id number] DOB: 01-18-1951 Today's Date: 10/24/2016    History of present illness Pt is a 66 y.o. male who presents to ED on 10/15/16 after being found down with R-side weakness and aphasia at home. 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 CXR 3/28showed multiple R rib fxs. On 3/29, had R CCA and SCA thromboembolectomy. CXR 3/30 showed R basilar pneumothorax and emphysema with chest tube placement. Pt remains intubated (failed extubation with resp arrest on 3//30)  Extubated 10/22/16. 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     OT comments  Pt able to perform functional mobility with min guard assist initially; min assist with fatigue and pt indicating L knee pain. Able to perform grooming activity standing at the sink with min guard assist and was supervision overall for bed mobility with cues for safety due to multiple lines/tubes. D/c plan remains appropriate. Will continue to follow acutely.   Follow Up Recommendations  Home health OT;Supervision/Assistance - 24 hour    Equipment Recommendations  3 in 1 bedside commode;Tub/shower bench    Recommendations for Other Services      Precautions / Restrictions Precautions Precautions: Fall Precaution Comments: chest tube Restrictions Weight Bearing Restrictions: No       Mobility Bed Mobility Overal bed mobility: Needs Assistance Bed Mobility: Supine to Sit;Sit to Supine     Supine to sit: Supervision Sit to supine: Supervision   General bed mobility comments: Supervision for safety. Pt moves quick with multiple lines/tubes; cues to slow down and for safety.  Transfers Overall transfer level: Needs assistance Equipment used: None Transfers: Sit to/from Stand Sit to Stand: Min guard          General transfer comment: No physical assist; min guard for balance    Balance Overall balance assessment: Needs assistance Sitting-balance support: Feet supported;No upper extremity supported Sitting balance-Leahy Scale: Good     Standing balance support: No upper extremity supported;During functional activity Standing balance-Leahy Scale: Fair Standing balance comment: min guard- min assist for dynamic balance                           ADL either performed or assessed with clinical judgement   ADL Overall ADL's : Needs assistance/impaired     Grooming: Min guard;Standing;Wash/dry Geophysical data processor Transfer: Min guard;Ambulation Toilet Transfer Details (indicate cue type and reason): Simulated by sit to stand from EOB with functional mobility.         Functional mobility during ADLs: Min guard;Minimal assistance (min A at times for balance) General ADL Comments: Pt reports no dizziness during session but off balance with distance mobility; per pt due to L knee pain. VSS throughout with slight drop in SpO2 at end of session; rebounds to 90s with pursed lip breathing and seated rest.     Vision       Perception     Praxis      Cognition Arousal/Alertness: Awake/alert Behavior During Therapy: Impulsive Overall Cognitive Status: Impaired/Different from baseline Area of Impairment: Safety/judgement;Problem solving;Orientation                 Orientation Level: Disoriented to;Place (?word finding difficulties, "I fell in  the kitchen")       Safety/Judgement: Decreased awareness of safety;Decreased awareness of deficits   Problem Solving: Requires verbal cues General Comments: Pt with expressive deficits; difficult to assess cognition. Follows commands well but is impulsive with movement. Having word finding difficulties when asking orientation questions and pt getting mildly frustrated.         Exercises     Shoulder  Instructions       General Comments      Pertinent Vitals/ Pain       Pain Assessment: 0-10 Pain Score: 10-Worst pain ever Pain Location: L knee Pain Descriptors / Indicators: Sore Pain Intervention(s): Monitored during session;Limited activity within patient's tolerance;Premedicated before session  Home Living                                          Prior Functioning/Environment              Frequency  Min 2X/week        Progress Toward Goals  OT Goals(current goals can now be found in the care plan section)  Progress towards OT goals: Progressing toward goals  Acute Rehab OT Goals Patient Stated Goal: get back home OT Goal Formulation: With patient  Plan Discharge plan remains appropriate    Co-evaluation                 End of Session    OT Visit Diagnosis: Unsteadiness on feet (R26.81)   Activity Tolerance Patient tolerated treatment well   Patient Left in bed;with call bell/phone within reach;with bed alarm set   Nurse Communication          Time: 8251-8984 OT Time Calculation (min): 18 min  Charges: OT General Charges $OT Visit: 1 Procedure OT Treatments $Self Care/Home Management : 8-22 mins  Garcia Dalzell A. Ulice Brilliant, M.S., OTR/L Pager: [place] 10/24/2016, 1:05 PM

## 2016-10-24 NOTE — Progress Notes (Signed)
Nutrition Follow-up  INTERVENTION:   Recommend bowel regimen  Ensure Enlive po BID, each supplement provides 350 kcal and 20 grams of protein (chocolate flavor)  NUTRITION DIAGNOSIS:   Inadequate oral intake related to  (decreased appetite) as evidenced by meal completion < 50%. Ongoing.   GOAL:   Patient will meet greater than or equal to 90% of their needs Progressing.   MONITOR:   PO intake, Supplement acceptance, I & O's  ASSESSMENT:   Pt with hx of stage 4 lung cancer, COPD, DM, HTN, CKD, and depression admitted with large L MCA infarct s/p complete revascularization 3/28 and R thromboembolectomy for saddle embolus 3/29. Failed extubated 3/30 and was emergently re-intubated.   Per pt and wife he ate 50% of his Breakfast. He likes ensure (chocolate) Pt discussed during ICU rounds and with RN.  Stage IV lung cancer with new mets, plan for outpt follow up  Diet Order:  DIET DYS 3 Room service appropriate? Yes; Fluid consistency: Thin  Skin:  Reviewed, no issues  Last BM:  unknown  Height:   Ht Readings from Last 1 Encounters:  10/17/16 '5\' 10"'$  (1.778 m)    Weight:   Wt Readings from Last 1 Encounters:  10/24/16 153 lb 14.1 oz (69.8 kg)    Ideal Body Weight:  75.4 kg  BMI:  Body mass index is 22.08 kg/m.  Estimated Nutritional Needs:   Kcal:  1900-2100  Protein:  95-115 grams  Fluid:  > 1.9 L/day  EDUCATION NEEDS:   No education needs identified at this time  [place], [place], [place] Pager [phone] After Hours Pager

## 2016-10-24 NOTE — Progress Notes (Signed)
Rehab admissions - Patient is now making good progress and PT/OT now recommending HH therapies.  Likely will not need acute inpatient rehab admission.  Call me for questions.  #[phone]

## 2016-10-24 NOTE — Progress Notes (Addendum)
Progress Note    10/24/2016 7:44 AM 8 Days Post-Op  Subjective:  Sleeping and awakes easily to voice  Afebrile HR 90's NSR 56'O-130'Q systolic 657% RA  Vitals:   10/24/16 0500 10/24/16 0600  BP: 97/63 117/78  Pulse: 93 95  Resp: (!) 28 (!) 22  Temp:      Physical Exam: Cardiac:  regular Lungs:  Non labored Incisions:  Clean and dry-healing nicely Extremities:  5/5 hand grip bilaterally Neuro:  In tact  CBC    Component Value Date/Time   WBC 10.5 10/23/2016 0305   RBC 3.52 (L) 10/23/2016 0305   HGB 10.1 (L) 10/23/2016 0305   HGB 12.8 (L) 04/25/2014 0407   HCT 31.6 (L) 10/23/2016 0305   HCT 33.6 (L) 07/07/2016 0950   PLT 275 10/23/2016 0305   PLT 329 07/07/2016 0950   MCV 89.8 10/23/2016 0305   MCV 86 07/07/2016 0950   MCV 96 04/25/2014 0407   MCH 28.7 10/23/2016 0305   MCHC 32.0 10/23/2016 0305   RDW 14.4 10/23/2016 0305   RDW 19.0 (H) 07/07/2016 0950   RDW 13.3 04/25/2014 0407   LYMPHSABS 1.1 10/23/2016 0305   LYMPHSABS 0.5 (L) 07/07/2016 0950   LYMPHSABS 0.6 (L) 04/25/2014 0407   MONOABS 0.8 10/23/2016 0305   MONOABS 0.6 04/25/2014 0407   EOSABS 0.0 10/23/2016 0305   EOSABS 0.0 04/25/2014 0407   BASOSABS 0.0 10/23/2016 0305   BASOSABS 0.0 04/25/2014 0407    BMET    Component Value Date/Time   NA 137 10/24/2016 0343   NA 141 07/07/2016 0950   NA 139 04/25/2014 0407   K 3.4 (L) 10/24/2016 0343   K 3.6 04/25/2014 0407   CL 97 (L) 10/24/2016 0343   CL 111 (H) 04/25/2014 0407   CO2 28 10/24/2016 0343   CO2 24 04/25/2014 0407   GLUCOSE 100 (H) 10/24/2016 0343   GLUCOSE 177 (H) 04/25/2014 0407   BUN 24 (H) 10/24/2016 0343   BUN 21 07/07/2016 0950   BUN 29 (H) 04/25/2014 0407   CREATININE 0.89 10/24/2016 0343   CREATININE 1.50 (H) 04/25/2014 0407   CALCIUM 9.2 10/24/2016 0343   CALCIUM 8.4 (L) 04/25/2014 0407   GFRNONAA >60 10/24/2016 0343   GFRNONAA 43 (L) 04/24/2014 0052   GFRAA >60 10/24/2016 0343   GFRAA 52 (L) 04/24/2014 0052     INR    Component Value Date/Time   INR 1.31 10/24/2016 0330     Intake/Output Summary (Last 24 hours) at 10/24/16 0744 Last data filed at 10/24/16 0600  Gross per 24 hour  Intake           573.16 ml  Output              610 ml  Net           -36.84 ml     Assessment:  66 y.o. male is s/p:  R carotid and subclavian embolectomy  8 Days Post-Op  Plan: -pt doing well-incision is healing nicely -neuro in tact -chest tube removed a couple days ago-100% RA -heparin to coumadin bridge per neuro -f/u with Dr. Donzetta Matters in 2-3 weeks.   Leontine Locket, PA-C [facility] [phone] 10/24/2016 7:44 AM  I have independently interviewed patient and agree with PA assessment and plan above. Anticoagulation per primary. Please call vascular surgery on call over weekend if issues arise otherwise will see again Monday. He is ok for discharge from vascular standpoint and can f/u in 2-3 weeks  in office.  Danyka Merlin C. Donzetta Matters, MD [facility] [place] Office: [phone] Pager: [phone]

## 2016-10-24 NOTE — Progress Notes (Signed)
STROKE TEAM PROGRESS NOTE   SUBJECTIVE (INTERVAL HISTORY) Patient without complaints. Anxious to go home. Arrhythmia noted on cardiac strips. Chest tube still in place. Denies chest pain, new shortness of breath, new neuro deficits.   OBJECTIVE Temp:  [97.3 F (36.3 C)-98.5 F (36.9 C)] 98.5 F (36.9 C) (04/06 0800) Pulse Rate:  [76-108] 98 (04/06 0800) Cardiac Rhythm: Normal sinus rhythm (04/06 0800) Resp:  [19-28] 27 (04/06 0800) BP: (77-154)/(50-127) 121/84 (04/06 0800) SpO2:  [93 %-100 %] 100 % (04/06 0800) Weight:  [69.8 kg (153 lb 14.1 oz)] 69.8 kg (153 lb 14.1 oz) (04/06 0500)   Recent Labs Lab 10/23/16 1550 10/23/16 1929 10/23/16 2343 10/24/16 0332 10/24/16 0815  GLUCAP 198* 203* 114* 101* 121*    Recent Labs Lab 10/19/16 0700  10/21/16 0300 10/22/16 0420 10/23/16 0305 10/23/16 1708 10/24/16 0330 10/24/16 0343  NA  --   < > 143 141 140 134*  --  137  K  --   < > 3.6 3.4* 3.7 4.1  --  3.4*  CL  --   < > 107 100* 99* 96*  --  97*  CO2  --   < > 23 32 32 28  --  28  GLUCOSE  --   < > 229* 144* 94 224*  --  100*  BUN  --   < > 40* 38* 29* 30*  --  24*  CREATININE  --   < > 0.89 0.83 0.78 0.94  --  0.89  CALCIUM  --   < > 9.0 8.9 9.4 9.3  --  9.2  MG 1.9  < > 1.8 1.7 1.8 2.2 2.0  --   PHOS 2.0*  --  1.5* 3.3 3.5  --   --  3.5  < > = values in this interval not displayed.  Recent Labs Lab 10/21/16 0300 10/22/16 0420 10/23/16 0305 10/24/16 0343  ALBUMIN 2.2* 2.2* 2.4* 2.2*    Recent Labs Lab 10/20/16 0449 10/21/16 0300 10/22/16 0420 10/23/16 0305 10/24/16 0659  WBC 7.2 5.7 8.6 10.5 15.3*  NEUTROABS  --  3.6 6.5 8.6* 13.1*  HGB 9.7* 9.4* 9.9* 10.1* 10.4*  HCT 30.6* 28.5* 30.6* 31.6* 31.8*  MCV 89.7 90.2 88.7 89.8 88.3  PLT 279 250 269 275 267    Recent Labs  10/23/16 1522 10/24/16 0330  LABPROT 14.5 16.4*  INR 1.12 1.31     PHYSICAL EXAM General: Vital signs reviewed.  Patient is Chronically ill appearing, appears older than stated  age, extubated, pleasant.  Cardiovascular: mildly tachycardiac, regular.  Pulmonary/Chest: Clear to auscultation, no wheezes. Chest tube in place. Bruising noted over R subclavian area.  Abdominal: Soft, non-tender, non-distended, BS +.  Extremities: No lower extremity edema bilaterally, pulses symmetric and intact bilaterally. Neurological Exam :Awake, alert.  . Patient moves all extremities.well equally without any focal weakness Normal sensation throughout.    Following commands, with some difficultly with peripheral commands. Has word finding difficulties and expressive aphasia with some trouble with naming and repetition. Extraocular muscles intact, PERRLA.no focal weakness. Tremulous noted. Sensation intact to light touch throughout in all 4 extremities.   ASSESSMENT/PLAN Ralph Lopez is a 66 y.o. male with history of hypertension, type 2 diabetes, hyperlipidemia, CKD, COPD, stage IV lung cancer undergoing active treatment admitted for achalasia, right sided weakness and facial droop and found to have an acute left MCA CVA status post revascularization.   Acute left MCA Stroke with L CC thrombus s/p TICI3 revascularization.  Infarct embolic due to atrial fibrillation vs hypercoagulable state from lung cancer.    Resultant aphasia, dysphagia  MRI: Left MCA distribution diffusion restriction is present in the left insula, left anterior temporal lobe, and left lateral frontal lobe in distribution similar to CT perfusion CBF less than 30%.   CT Perfusion: The acute infarct involving the left MCA territory with estimated volume of CT a less than 30% at 43 mL. Mismatch volume 131 mL.  CT Angio head/neck: Thrombus in the distal left M1 segment to the bifurcation. Flow is present in reduced caliber left MCA branch vessels. The thrombus is likely nonocclusive. Saddle embolus at the innominate artery bifurcation into the right common carotid artery and right subclavian artery. This embolus is  nonocclusive.   Cerebral angio complete revascularization of left MCA occlusion with 1 pass with the Solitaire FR 4 mm x 40 mm retrieval device achieving a TICI 3 reperfusion  CT Head: Improvement in left MCA hypodensity following clot retrieval.   Echo: 54-62%, grade 1 diastolic, no evidence of vegetation  LDL 59  HgbA1c 5.8  Heparin for VTE prophylaxis  DIET DYS 3 Room service appropriate? Yes; Fluid consistency: Thin   aspirin 81 mg daily prior to admission, now on heparin IV. Given 1 dose of Coumadin. Will stop coumadin for planned for chest tube removal. Consider lovenox given cancer dx rather than coumadin or a DOAC.   SLP added for aphasia  Therapy recommendations:  CIR-> improving. Likely will only need HH at discharge  Disposition:  Pending  Left Common Carotid Thrombus Non-Occlusive s/p Revascularization with IR Right Common Carotid Thrombus and Right Subclavian Thrombus and Stenosis Non-Occlusive  CT Angio head/neck: Thrombus in the distal left M1 segment to the bifurcation. Flow is present in reduced caliber left MCA branch vessels. The thrombus is likely nonocclusive. Saddle embolus at the innominate artery bifurcation into the right common carotid artery and right subclavian artery. This embolus is nonocclusive.  S/p Right CC and R subclavian artery thrombectomy with removal of large gelatinous single embolus with vascular surgery on 3/30- unclear if blood clot or tissue- sent to pathology  Follow up Dr. Donzetta Matters in 2-3 week  Arrythmia, possible atrial fibrillation/AFlutter/SVT  New 10/23/2016. Seen on tele. Not confirmed with EKG  Placed on amiodarone  Now on IV heparin and coumadin  Coumadin stopped d/t plan to remove chest tube.   Consider lovenox long-term given cancer dx  CHA2DS2-VASc Score = at least 6  Age in Years:  13-74   +1  Sex:  Male   0  Hypertension History:  yes   +1     Diabetes Mellitus:  yes   +1   Congestive Heart Failure History:  0     Vascular Disease History:  yes   +1     Stroke/TIA/Thromboembolism History:  yes   +2  May benefit from prolonged cardiac monitoring as an outpt to see arrhythmia burden    Hyperglycenia  hgbA1c 5.8, goal < 7.0  Felt to be due to steroids  CBG monitoring  diabetes RN recommend 5U novolog meal coverage. Will add.  Hypertension  Home meds:   Amlodipine 5 mg daily, atenolol 100 mg daily, lisinopril 40 mg daily  Variable - 77/90-124/127  Continue to monitor  Hyperlipidemia  Home meds:  Atorvastatin 10 mg daily, resumed  LDL 59, goal < 70  Continue statin at discharge  Other Stroke Risk Factors  History of tobacco abuse  Active malignancy  UDS not performed  Other  Active Problems  Chronic normocytic anemia-hemoglobin 14.4  Mild leukocytosis -> resolved   Cardiac arrest 2/2 to respiratory arrest during extubation  Right pneumothorax -> chest tube -> planned removal today by CCM  Possible COPD exacerbation. On duonebs and prendisone. Recommend a 1 week taper  Chronic renal failure/Urinary retention. New flomax  Hypokalemia, replaced today. monitor  Stage IV Lung Cancer  Has been treated with carboplatin and Taxol, given new evidence of metastasis to left hip, plans are to start radiation therapy  Hospital day # [place] for Pager information 10/24/2016 10:07 AM   I have personally examined this patient, reviewed notes, independently viewed imaging studies, participated in medical decision making and plan of care.ROS completed by me personally and pertinent positives fully documented  I have made any additions or clarifications directly to the above note.  Neurologically stable, has mild aphasia. Will D/C coumadin to limit risk of bleeding after chest tube removal. Will opt for Lovenox for anticoagulation rather than coumadin or newer agents given history of hyeprcoaguable state and cancer. Having arrhythmias  noted on his heart monitor, but not caught on EKG. May need prolonged heart monitoring as an outpt to help delineate burden of arrhythmia to see if any intervention needed, although anticoagulation choice would remain the same.   Loney Hering, D.O.  [facility] [phone]   To contact Stroke Continuity provider, please refer to [email or web address] After hours, contact General Neurology

## 2016-10-24 NOTE — Telephone Encounter (Signed)
Scheduled 4/27 @ 11am.

## 2016-10-24 NOTE — Progress Notes (Signed)
ANTICOAGULATION CONSULT NOTE - Follow Up Consult  Pharmacy Consult for heparin Indication: arterial thrombus in setting of CVA  Labs:  Recent Labs  10/22/16 0420 10/23/16 0305 10/23/16 1522 10/23/16 1708 10/23/16 2020 10/24/16 0330 10/24/16 0343 10/24/16 0659 10/24/16 1632  HGB 9.9* 10.1*  --   --   --   --   --  10.4*  --   HCT 30.6* 31.6*  --   --   --   --   --  31.8*  --   PLT 269 275  --   --   --   --   --  267  --   LABPROT  --   --  14.5  --   --  16.4*  --   --   --   INR  --   --  1.12  --   --  1.31  --   --   --   HEPARINUNFRC  --   --   --   --  <0.10*  --  <0.10*  --  <0.10*  CREATININE 0.83 0.78  --  0.94  --   --  0.89  --   --     Assessment: 66 yo male on heparin for arterial thrombus in the setting of CVA.  Heparin was held today ~ 0830 in anticipation of chest tube removal at 1400.  Heparin was restarted 2 hours after removal.  Heparin level drawn at 1632 is only a few minutes after heparin restart, not surprising that it is undetectable.  Will retime heparin level accordingly.  Goal of Therapy:  Heparin level 0.3-0.5 units/ml   Plan:  Continue heparin at 1150 units/hr and check level at midnight.  [employer], Pharm.D., BCPS Clinical Pharmacist Pager [phone] 10/24/2016 6:25 PM

## 2016-10-25 ENCOUNTER — Inpatient Hospital Stay (HOSPITAL_COMMUNITY): Payer: BLUE CROSS/BLUE SHIELD

## 2016-10-25 DIAGNOSIS — I6310 Cerebral infarction due to embolism of unspecified precerebral artery: Secondary | ICD-10-CM

## 2016-10-25 DIAGNOSIS — I480 Paroxysmal atrial fibrillation: Secondary | ICD-10-CM

## 2016-10-25 DIAGNOSIS — E876 Hypokalemia: Secondary | ICD-10-CM

## 2016-10-25 DIAGNOSIS — I4891 Unspecified atrial fibrillation: Secondary | ICD-10-CM

## 2016-10-25 DIAGNOSIS — G893 Neoplasm related pain (acute) (chronic): Secondary | ICD-10-CM

## 2016-10-25 LAB — CBC WITH DIFFERENTIAL/PLATELET
BASOS ABS: 0 10*3/uL (ref 0.0–0.1)
Basophils Relative: 0 %
Eosinophils Absolute: 0.1 10*3/uL (ref 0.0–0.7)
Eosinophils Relative: 1 %
HEMATOCRIT: 28.6 % — AB (ref 39.0–52.0)
HEMOGLOBIN: 9.2 g/dL — AB (ref 13.0–17.0)
LYMPHS ABS: 0.3 10*3/uL — AB (ref 0.7–4.0)
LYMPHS PCT: 2 %
MCH: 28.5 pg (ref 26.0–34.0)
MCHC: 32.2 g/dL (ref 30.0–36.0)
MCV: 88.5 fL (ref 78.0–100.0)
Monocytes Absolute: 1.2 10*3/uL — ABNORMAL HIGH (ref 0.1–1.0)
Monocytes Relative: 10 %
NEUTROS ABS: 10.7 10*3/uL — AB (ref 1.7–7.7)
NEUTROS PCT: 87 %
Platelets: 240 10*3/uL (ref 150–400)
RBC: 3.23 MIL/uL — AB (ref 4.22–5.81)
RDW: 14.2 % (ref 11.5–15.5)
WBC: 12.2 10*3/uL — AB (ref 4.0–10.5)

## 2016-10-25 LAB — RENAL FUNCTION PANEL
ALBUMIN: 2 g/dL — AB (ref 3.5–5.0)
ANION GAP: 11 (ref 5–15)
BUN: 22 mg/dL — AB (ref 6–20)
CHLORIDE: 96 mmol/L — AB (ref 101–111)
CO2: 27 mmol/L (ref 22–32)
Calcium: 9.2 mg/dL (ref 8.9–10.3)
Creatinine, Ser: 1.01 mg/dL (ref 0.61–1.24)
GFR calc Af Amer: >60 mL/min (ref >60)
Glucose, Bld: 107 mg/dL — ABNORMAL HIGH (ref 65–99)
POTASSIUM: 3.5 mmol/L (ref 3.5–5.1)
Phosphorus: 3.1 mg/dL (ref 2.5–4.6)
Sodium: 134 mmol/L — ABNORMAL LOW (ref 135–145)

## 2016-10-25 LAB — GLUCOSE, CAPILLARY
GLUCOSE-CAPILLARY: 116 mg/dL — AB (ref 65–99)
GLUCOSE-CAPILLARY: 285 mg/dL — AB (ref 65–99)
GLUCOSE-CAPILLARY: 294 mg/dL — AB (ref 65–99)
GLUCOSE-CAPILLARY: 133 mg/dL — AB (ref 65–99)

## 2016-10-25 LAB — HEPARIN LEVEL (UNFRACTIONATED)
Heparin Unfractionated: <0.1 IU/mL — ABNORMAL LOW (ref 0.30–0.70)
Heparin Unfractionated: <0.1 IU/mL — ABNORMAL LOW (ref 0.30–0.70)

## 2016-10-25 LAB — MAGNESIUM: Magnesium: 1.8 mg/dL (ref 1.7–2.4)

## 2016-10-25 MED ORDER — METOPROLOL TARTRATE 25 MG PO TABS
25.0000 mg | ORAL_TABLET | Freq: Three times a day (TID) | ORAL | Status: DC
  Administered 2016-10-25 – 2016-10-27 (×7): 25 mg via ORAL
  Filled 2016-10-25 (×7): qty 1

## 2016-10-25 MED ORDER — POTASSIUM CHLORIDE 20 MEQ PO PACK: 20.0000 meq | PACK | Freq: Once | ORAL | Status: DC

## 2016-10-25 MED ORDER — MAGNESIUM SULFATE 2 GM/50ML IV SOLN
2.0000 g | Freq: Once | INTRAVENOUS | Status: AC
  Administered 2016-10-25: 2 g via INTRAVENOUS
  Filled 2016-10-25: qty 50

## 2016-10-25 MED ORDER — ENOXAPARIN SODIUM 80 MG/0.8ML ~~LOC~~ SOLN
1.0000 mg/kg | Freq: Two times a day (BID) | SUBCUTANEOUS | Status: DC
  Administered 2016-10-25 – 2016-10-27 (×5): 70 mg via SUBCUTANEOUS
  Filled 2016-10-25 (×5): qty 0.8

## 2016-10-25 MED ORDER — BISACODYL 10 MG RE SUPP
10.0000 mg | Freq: Every day | RECTAL | Status: DC | PRN
  Administered 2016-10-25: 10 mg via RECTAL
  Filled 2016-10-25: qty 1

## 2016-10-25 NOTE — Progress Notes (Addendum)
ANTICOAGULATION CONSULT NOTE - Follow Up Consult  Pharmacy Consult for heparin Indication: arterial thrombus in setting of CVA  Labs:  Recent Labs  10/23/16 0305 10/23/16 1522 10/23/16 1708  10/24/16 0330 10/24/16 0343 10/24/16 0659 10/24/16 1632 10/25/16 0019 10/25/16 0451 10/25/16 0928  HGB 10.1*  --   --   --   --   --  10.4*  --   --  9.2*  --   HCT 31.6*  --   --   --   --   --  31.8*  --   --  28.6*  --   PLT 275  --   --   --   --   --  267  --   --  240  --   LABPROT  --  14.5  --   --  16.4*  --   --   --   --   --   --   INR  --  1.12  --   --  1.31  --   --   --   --   --   --   HEPARINUNFRC  --   --   --   < >  --  <0.10*  --  <0.10* <0.10*  --  <0.10*  CREATININE 0.78  --  0.94  --   --  0.89  --   --   --  1.01  --   < > = values in this interval not displayed.  Assessment: 65 yo male on heparin for arterial thrombus in the setting of CVA.  Heparin was held today ~ 0830 in anticipation of chest tube removal at 1400.  Heparin was restarted 2 hours after removal.  Heparin level drawn at 1632 is only a few minutes after heparin restart, not surprising that it is undetectable.  Will retime heparin level accordingly.  Repeat heparin level remains undetectable on heparin 1300 units/hr. Nurse reports no issues with infusion or bleeding.  Goal of Therapy:  Heparin level 0.3-0.5 units/ml   Plan:  Increase heparin to 1550 units/hr 8h HL Monitor s/sx of bleeding F/u transition to long-term anticoag  Andrey Cota. Diona Foley, PharmD, BCPS Clinical Pharmacist [phone] 10/25/2016 10:47 AM  ADDN: Pharmacy is consulted to transition heparin to lovenox for atrial fibrillation.   Plan: Stop heparin Start lovenox '1mg'$ /kg subcutaneously q12h Monitor s/sx of bleeding  Andrey Cota. Diona Foley, PharmD, [place] Clinical Pharmacist [phone]

## 2016-10-25 NOTE — Progress Notes (Signed)
  Speech Language Pathology Treatment: Dysphagia  Patient Details Name: Ralph Lopez MRN: [id number] DOB: [birth year] Today's Date: 10/25/2016 Time: 9833-8250 SLP Time Calculation (min) (ACUTE ONLY): 10 min  Assessment / Plan / Recommendation Clinical Impression  Patient seen for dysphagia treatment. He is alert and pleasant, needs minimal assistance for postural adjustment to maximize safety for PO trials. Provided upgraded trials of regular solid with thin liquids. Patient initially tolerates thin liquids with no overt signs of aspiration. Noted with impulsivity with cracker, rapid rate of intake while holding solid in left buccal cavity. Immediate cough with wet vocal quality following liquid wash, suggestive of reduced airway protection. With moderate cues, pt reduces rate. No overt signs of aspiration noted with single bites/sips and slow rate of intake. Informed RN, recommend continuing dys 3 with thin liquids with full supervision to monitor and cue for slow rate, single bites and sips, medications whole in puree.      HPI HPI: Pt is a 66 y.o. male who presents to ED on 10/15/16 after being found down with R-side weakness and aphasia at home. 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 CXR 3/28 showed multiple R rib fxs. On 3/29, had R CCA and SCA thromboembolectomy. CXR 3/30 showed R basilar pneumothorax and emphysema with chest tube placement. Pt was  intubated 3/28 (failed extubation with resp arrest on 3/30) , extubated 10/22/16. 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       SLP Plan  Continue with current plan of care       Recommendations  Diet recommendations: Dysphagia 3 (mechanical soft);Thin liquid Liquids provided via: Cup Medication Administration: Whole meds with puree Supervision: Patient able to self feed;Full  supervision/cueing for compensatory strategies Compensations: Slow rate;Small sips/bites Postural Changes and/or Swallow Maneuvers: Seated upright 90 degrees;Upright 30-60 min after meal                Oral Care Recommendations: Oral care BID Follow up Recommendations: 24 hour supervision/assistance;Home health SLP SLP Visit Diagnosis: Dysphagia, unspecified (R13.10) Plan: Continue with current plan of care       [place], [place] CF-SLP Speech-Language Pathologist [phone]  Aliene Altes 10/25/2016, 4:09 PM

## 2016-10-25 NOTE — Progress Notes (Signed)
ANTICOAGULATION CONSULT NOTE - Follow Up Consult  Pharmacy Consult for heparin Indication: arterial thrombus in setting of CVA  Labs:  Recent Labs  10/22/16 0420 10/23/16 0305 10/23/16 1522 10/23/16 1708  10/24/16 0330 10/24/16 0343 10/24/16 0659 10/24/16 1632 10/25/16 0019  HGB 9.9* 10.1*  --   --   --   --   --  10.4*  --   --   HCT 30.6* 31.6*  --   --   --   --   --  31.8*  --   --   PLT 269 275  --   --   --   --   --  267  --   --   LABPROT  --   --  14.5  --   --  16.4*  --   --   --   --   INR  --   --  1.12  --   --  1.31  --   --   --   --   HEPARINUNFRC  --   --   --   --   < >  --  <0.10*  --  <0.10* <0.10*  CREATININE 0.83 0.78  --  0.94  --   --  0.89  --   --   --   < > = values in this interval not displayed.  Assessment: 66yo male remains undetectable on heparin after rate increase (no levels at this rate yet since it had been held in the am).  Goal of Therapy:  Heparin level 0.3-0.5 units/ml   Plan:  Will increase heparin gtt by 2 units/kg/hr to 1300 units/hr and check level in 6hr.  Wynona Neat, PharmD, BCPS  10/25/2016,2:58 AM

## 2016-10-25 NOTE — Progress Notes (Signed)
[facility] Attending Note  Presenting HPI:  66 y.o. male with PMH of DM, HTN, HLD, CKD, COPD, Stage 4 Lung CA, and Depression. Of note patient is being treated with carboplatin and Taxol. Has had radiation therapy with a planned PET soon. However, he has been complaining of hip pain in which he was told was due to a pelvic mass. Presents to ED on 3/28 as a code stroke. Wife reports seeing him at 0300 in his normal state when he got up to go to the bathroom. Later in the morning wife found him on the ground of the kitchen aphasic. 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 Upon arrival to the ED CT revealed a large left MCA. Patient was intubated and then underwent a left common carotid arteriogram followed followed by complete revascularization. PCCM asked to consult for vent management.  Subjective:   No acute events.  CT removed w/o issues.  (-) subjective complaints.   Temp:  [97.3 F (36.3 C)-99 F (37.2 C)] 98.3 F (36.8 C) (04/07 1200) Pulse Rate:  [89-116] 99 (04/07 1200) Resp:  [16-31] 26 (04/07 1200) BP: (84-115)/(56-82) 99/63 (04/07 1200) SpO2:  [95 %-100 %] 100 % (04/07 1450) Weight:  [69.2 kg (152 lb 8.9 oz)] 69.2 kg (152 lb 8.9 oz) (04/07 0500)  Gen.: Awake. Lights off. Again watching TV. Wife at bedside.  Integument: No rash or bruising on exposed skin. Warm and dry. HEENT: No scleral icterus. Moist mucous membranes. Face appears symmetric. Cardiovascular: Regular rate. No edema or JVD appreciated.  Chest: Normal work of breathing. Clear bilaterally to auscultation. Right-sided chest tube clamped. Neurological: Following commands. Still has expressive aphasia. Strength 5/5 and grossly symmetric.  LINES/TUBES: OETT 7.5 3/28 - 3/30; 3/30 - 4/4 OGT 3/30 - 4/4 R CHEST TUBE >>> PIV  CBC Latest Ref Rng & Units 10/25/2016 10/24/2016 10/23/2016  WBC 4.0 - 10.5 K/uL 12.2(H) 15.3(H) 10.5  Hemoglobin 13.0 - 17.0 g/dL 9.2(L) 10.4(L) 10.1(L)   Hematocrit 39.0 - 52.0 % 28.6(L) 31.8(L) 31.6(L)  Platelets 150 - 400 K/uL 240 267 275    BMP Latest Ref Rng & Units 10/25/2016 10/24/2016 10/23/2016  Glucose 65 - 99 mg/dL 107(H) 100(H) 224(H)  BUN 6 - 20 mg/dL 22(H) 24(H) 30(H)  Creatinine 0.61 - 1.24 mg/dL 1.01 0.89 0.94  BUN/Creat Ratio 10 - 24 - - -  Sodium 135 - 145 mmol/L 134(L) 137 134(L)  Potassium 3.5 - 5.1 mmol/L 3.5 3.4(L) 4.1  Chloride 101 - 111 mmol/L 96(L) 97(L) 96(L)  CO2 22 - 32 mmol/L 27 28 28   Calcium 8.9 - 10.3 mg/dL 9.2 9.2 9.3   Hepatic Function Latest Ref Rng & Units 10/25/2016 10/24/2016 10/23/2016  Total Protein 6.5 - 8.1 g/dL - - -  Albumin 3.5 - 5.0 g/dL 2.0(L) 2.2(L) 2.4(L)  AST 15 - 41 U/L - - -  ALT 17 - 63 U/L - - -  Alk Phosphatase 38 - 126 U/L - - -  Total Bilirubin 0.3 - 1.2 mg/dL - - -    IMAGING/STUDIES: CTA HEAD/NECK 3/28: 1. Thrombus in the distal left M1 segment to the bifurcation. Flow is present in reduced caliber left MCA branch vessels. The thrombus is likely nonocclusive. Collateral vessels are also present. 2. Saddle embolus at the innominate artery bifurcation into the right common carotid artery and right subclavian artery. This embolus is nonocclusive. 3. Atherosclerotic changes at the carotid bifurcations and cavernous internal carotid arteries bilaterally without significant stenosis. 4.  Atherosclerotic calcifications with moderate stenosis in the proximal left vertebral artery. MRI BRAIN W/O 3/29: Left MCA distribution diffusion restriction is present in the left insula, left anterior temporal lobe, and left lateral frontal lobe in distribution similar to CT perfusion CBF less than 30%. There is a small additional area of diffusion restriction within the left parietal lobe not indicated on the perfusion study. Findings are compatible with acute infarction. No associated hemorrhage identified. CT CHEST W/O 3/30: 1. Moderate-sized RIGHT pneumothorax as described on radiograph same day. 2.  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 3. Endotracheal tube in good position. 4. RIGHT suprahilar mass. 5. Bilateral round pulmonary metastasis significantly enlarged from prior PET-CT scan. PORT CXR 4/1: Previously reviewed by me. No appreciable right pneumothorax. Right-sided chest tube in place. Right hilar mass noted. Endotracheal tube and enteric feeding tube in good position. PORT CXR 4/4:  Previously reviewed by me. Right hilar mass and left lung masses noted. No pleural effusion appreciated. Endotracheal tube in stable position. PORT CXR 4/6:  Personally reviewed by me. Right and left lung masses noted. Continued volume loss on the right. No pneumothorax on the right. Chest tube in good position on the right.  MICROBIOLOGY: MRSA PCR 3/28: Negative   ANTIBIOTICS: None.  SIGNIFICANT EVENTS: 03/28 - Admit w/ revascularization for L MCA CVA 03/30 - Extubated & had respiratory arrest that was <67mn w/ CPR & no drugs. Awake post arrest per report. 04/05 - Starting clamping trials with right chest tube  ASSESSMENT/PLAN:  66y.o. male with stage IV lung cancer with left MCA CVA post revascularization. Patient also with right subclavian artery embolus concerning for malignant clot as well. Patient has tolerated extubation well. Continues to have some aphasia. Tolerating clamping trials with no evidence of reaccumulation of pneumothorax. CT removed 4/6.   Neuro asked re: discontinuing amiodarone and whether we can switch heparin drip   1. Acute hypoxic respiratory failure: Resolved. Continuing to monitor pulse oximetry. 2. S/P Right pneumothorax. Stable.  3. Left MCA CVA: Post revascularization. Expressive aphasia persists. Continuing aspirin & Lipitor. Patient started on systemic anticoagulation.  4. Arrhythmia: Intermittent. Appears to be atrial fibrillation with intermittent atrial flutter. Continuous telemetry monitoring. Systemic  anticoagulation currently is heparin drip >> plan to transition to lovenox 1 mg/kg BID as this is better with pts with CA.  If lovenox can not be continued as outpt, he can be placed on oral anticoagulation.  Pt is on atenolol at home 100 mg.  Will start lopressor 25 mg TID >> gradually transition to atenolol 100 mg daily as long as BP tolerates.  Once on lopressor, wean off amiodarone today.  5. Possible COPD exacerbation: Continuing Duonebs every 6 hours. Continuing prednisone 40 mg daily. Recommend a one-week taper of prednisone. 6. Right subclavian artery embolus: Removed by vascular surgery. Atypical cells but no obvious malignancy. 7. Stage IV lung cancer. Status post chemotherapy. Plan for radiation therapy to pelvic mass. 8. Essential hypertension: start lopressor. Observe BP.    Other issues per primary.   PCCm will sign off for now. pls call back if with issues.    JMonica Becton MD 10/25/2016, 3:27 PM [facility] Pager [phone] After 3 pm or if no answer, call [phone]

## 2016-10-25 NOTE — Progress Notes (Signed)
STROKE TEAM PROGRESS NOTE   SUBJECTIVE (INTERVAL HISTORY) Patient without complaints. Anxious to go home. Chest tube removed. Still on Amiodarone drip. Foley to be removed tomorrow for urinary retention. Having some constipation but also on opioids for cancer related pain. Started novolog with meals given elevated blood sugars. Remains on heparin drip.   OBJECTIVE Temp:  [97.3 F (36.3 C)-98.8 F (37.1 C)] 97.9 F (36.6 C) (04/07 0400) Pulse Rate:  [89-116] 93 (04/07 0700) Cardiac Rhythm: Normal sinus rhythm (04/07 0400) Resp:  [18-26] 25 (04/07 0700) BP: (84-115)/(52-82) 92/64 (04/07 0700) SpO2:  [96 %-100 %] 98 % (04/07 0754) Weight:  [69.2 kg (152 lb 8.9 oz)] 69.2 kg (152 lb 8.9 oz) (04/07 0500)   Recent Labs Lab 10/24/16 0815 10/24/16 1132 10/24/16 1536 10/24/16 1933 10/24/16 2228  GLUCAP 121* 262* 336* 301* 120*    Recent Labs Lab 10/21/16 0300 10/22/16 0420 10/23/16 0305 10/23/16 1708 10/24/16 0330 10/24/16 0343 10/25/16 0451  NA 143 141 140 134*  --  137 134*  K 3.6 3.4* 3.7 4.1  --  3.4* 3.5  CL 107 100* 99* 96*  --  97* 96*  CO2 23 32 32 28  --  28 27  GLUCOSE 229* 144* 94 224*  --  100* 107*  BUN 40* 38* 29* 30*  --  24* 22*  CREATININE 0.89 0.83 0.78 0.94  --  0.89 1.01  CALCIUM 9.0 8.9 9.4 9.3  --  9.2 9.2  MG 1.8 1.7 1.8 2.2 2.0  --  1.8  PHOS 1.5* 3.3 3.5  --   --  3.5 3.1    Recent Labs Lab 10/21/16 0300 10/22/16 0420 10/23/16 0305 10/24/16 0343 10/25/16 0451  ALBUMIN 2.2* 2.2* 2.4* 2.2* 2.0*    Recent Labs Lab 10/21/16 0300 10/22/16 0420 10/23/16 0305 10/24/16 0659 10/25/16 0451  WBC 5.7 8.6 10.5 15.3* 12.2*  NEUTROABS 3.6 6.5 8.6* 13.1* 10.7*  HGB 9.4* 9.9* 10.1* 10.4* 9.2*  HCT 28.5* 30.6* 31.6* 31.8* 28.6*  MCV 90.2 88.7 89.8 88.3 88.5  PLT 250 269 275 267 240    Recent Labs  10/23/16 1522 10/24/16 0330  LABPROT 14.5 16.4*  INR 1.12 1.31     PHYSICAL EXAM General: Vital signs reviewed.  Patient is Chronically ill  appearing, appears older than stated age, pleasant.  Cardiovascular: mildly tachycardiac, regular.  Pulmonary/Chest:crackles noted of R lung base. Bruising noted over R subclavian area.  Abdominal: Soft, non-tender, non-distended, BS +.  Extremities: No lower extremity edema bilaterally, pulses symmetric and intact bilaterally. Neurological Exam :Awake, alert.  . Patient moves all extremities.well equally without any focal weakness Normal sensation throughout.    Following commands, with some difficultly with peripheral commands. Has word finding difficulties and expressive aphasia with some trouble with naming and repetition. Extraocular muscles intact, PERRLA.no focal weakness. Tremulous noted. Sensation intact to light touch throughout in all 4 extremities.   ASSESSMENT/PLAN Mr. Ralph Lopez is a 66 y.o. male with history of hypertension, type 2 diabetes, hyperlipidemia, CKD, COPD, stage IV lung cancer undergoing active treatment admitted for achalasia, right sided weakness and facial droop and found to have an acute left MCA CVA status post revascularization.   Acute left MCA Stroke with L CC thrombus s/p TICI3 revascularization. Infarct embolic due to atrial fibrillation vs hypercoagulable state from lung cancer.    Resultant aphasia, dysphagia  MRI: Left MCA distribution diffusion restriction is present in the left insula, left anterior temporal lobe, and left lateral frontal lobe in  distribution similar to CT perfusion CBF less than 30%.   CT Perfusion: The acute infarct involving the left MCA territory with estimated volume of CT a less than 30% at 43 mL. Mismatch volume 131 mL.  CT Angio head/neck: Thrombus in the distal left M1 segment to the bifurcation. Flow is present in reduced caliber left MCA branch vessels. The thrombus is likely nonocclusive. Saddle embolus at the innominate artery bifurcation into the right common carotid artery and right subclavian artery. This embolus is  nonocclusive.   Cerebral angio complete revascularization of left MCA occlusion with 1 pass with the Solitaire FR 4 mm x 40 mm retrieval device achieving a TICI 3 reperfusion  CT Head: Improvement in left MCA hypodensity following clot retrieval.   Echo: 72-62%, grade 1 diastolic, no evidence of vegetation  LDL 59  HgbA1c 5.8  Heparin for VTE prophylaxis  DIET DYS 3 Room service appropriate? Yes; Fluid consistency: Thin   aspirin 81 mg daily prior to admission, now on heparin IV. Plan to start therapeutic Lovenox today.   Therapy recommendations:  CIR-> improving. Likely will only need HH at discharge  Disposition:  Pending  Left Common Carotid Thrombus Non-Occlusive s/p Revascularization with IR Right Common Carotid Thrombus and Right Subclavian Thrombus and Stenosis Non-Occlusive  CT Angio head/neck: Thrombus in the distal left M1 segment to the bifurcation. Flow is present in reduced caliber left MCA branch vessels. The thrombus is likely nonocclusive. Saddle embolus at the innominate artery bifurcation into the right common carotid artery and right subclavian artery. This embolus is nonocclusive.  S/p Right CC and R subclavian artery thrombectomy with removal of large gelatinous single embolus with vascular surgery on 3/30- unclear if blood clot or tissue- sent to pathology  Follow up Dr. Donzetta Matters in 2-3 week  Arrythmia, possible atrial fibrillation/AFlutter/SVT  New 10/23/2016. Seen on tele. Not confirmed with EKG  Placed on amiodarone. Asking CCM for assistance in transitioning to oral regimen given it was started              while in the unit.   Plan to transition to Lovenox.   May benefit from prolonged cardiac monitoring as an outpt to see arrhythmia burden    Hyperglycenia  hgbA1c 5.8, goal < 7.0  Felt to be due to steroids  CBG monitoring  5U Novolog with units added per diabetes nurse, will continue to monitor.   Hypertension  Home meds:   Amlodipine 5 mg  daily, atenolol 100 mg daily, lisinopril 40 mg daily  Variable - 80's-100's asymptomatic. Will consider IV fluid bolus if pt becomes symptomatic.   Continue to monitor  Hyperlipidemia  Home meds:  Atorvastatin 10 mg daily, resumed  LDL 59, goal < 70  Continue statin at discharge  Other Stroke Risk Factors  History of tobacco abuse  Active malignancy  UDS not performed  Other Active Problems  Chronic normocytic anemia-hemoglobin 14.4  Mild leukocytosis -> resolved   Cardiac arrest 2/2 to respiratory arrest during extubation  Right pneumothorax -> chest tube -> planned removal today by CCM  Possible COPD exacerbation. On duonebs and prendisone. Recommend a 1 week taper  Chronic renal failure/Urinary retention. New flomax  Hypokalemia, repeat draw 3.5, repleted, will recheck Monday  Hypypo magensium, 1.7, give 2G today, recheck Monday   Stage IV Lung Cancer  Has been treated with carboplatin and Taxol, given new evidence of metastasis to left hip, plans are to start radiation  therapy  Hospital day # [place] for Pager information 10/25/2016 8:03 AM   I have personally examined this patient, reviewed notes, independently viewed imaging studies, participated in medical decision making and plan of care.ROS completed by me personally and pertinent positives fully documented  I have made any additions or clarifications directly to the above note.  Remains neurologically stable. He had his chest tube removed yesterday. Still remains on Amiodarone GTT. Will ask CCM for assistance in transitioning to oral regimen. Plan to start Lovenox therapeutic dosing this evening and get pt off of heparin GTT if no contraindication from pulmonary team. Repleting his electrolytes today and will give a suppository for constipation. Likely can be transferred to floor soon and d/ced to home if does well with Lovenox and he is off  Amiodarone GTT.   Loney Hering, D.O.  [facility] [phone]  To contact Stroke Continuity provider, please refer to [email or web address] After hours, contact General Neurology

## 2016-10-25 NOTE — Evaluation (Signed)
Speech Language Pathology Evaluation Patient Details Name: Ralph Lopez MRN: [id number] DOB: [birth year] Today's Date: 10/25/2016 Time: 2409-7353 SLP Time Calculation (min) (ACUTE ONLY): 19 min  Problem List:  Patient Active Problem List   Diagnosis Date Noted  . Paroxysmal atrial fibrillation (HCC)   . Respiratory failure ([place])   . Endotracheally intubated   . Pneumothorax on right   . Ventilator dependent ([place])   . Thrombosis of right carotid artery   . Stroke (cerebrum) ([place]) 10/15/2016  . Left hip pain 09/08/2016  . Primary cancer of right upper lobe of lung ([place]) 05/05/2016  . Trapezius muscle spasm 01/09/2016  . Type 2 diabetes mellitus with stage 3 chronic kidney disease, with long-term current use of insulin ([place]) 01/02/2016  . Essential hypertension 01/02/2016  . COPD, severe ([place]) 02/15/2015  . Chronic depression 02/15/2015  . CKD (chronic kidney disease), stage III 02/15/2015  . Acute anxiety 02/15/2015  . Hyperlipidemia 02/15/2015  . Diabetes ([place]) 02/15/2015   Past Medical History:  Past Medical History:  Diagnosis Date  . Anxiety   . Cancer ([place])    Stage IV Lung  . Chronic kidney disease   . COPD (chronic obstructive pulmonary disease) ([place])   . Depression   . Diabetes mellitus without complication ([place])   . Hyperlipidemia   . Hypertension    Past Surgical History:  Past Surgical History:  Procedure Laterality Date  . CAROTID-SUBCLAVIAN BYPASS GRAFT Right 10/16/2016   Procedure: RIGHT CAROTID-SUBCLAVIAN THROMBECTOMY;  Surgeon: Waynetta Sandy, MD;  Location: [facility];  Service: Vascular;  Laterality: Right;  . EYE SURGERY    . FLEXIBLE BRONCHOSCOPY N/A 04/28/2016   Procedure: FLEXIBLE BRONCHOSCOPY;  Surgeon: [name], MD;  Location: [facility];  Service: Pulmonary;  Laterality: N/A;  . IR GENERIC HISTORICAL  10/15/2016   IR PERCUTANEOUS ART THROMBECTOMY/INFUSION INTRACRANIAL INC DIAG ANGIO 10/15/2016 Luanne Bras, MD [facility]  . IR  GENERIC HISTORICAL  10/15/2016   IR ANGIOGRAM EXTREMITY RIGHT 10/15/2016 Luanne Bras, MD [facility]  . IR GENERIC HISTORICAL  10/15/2016   IR ANGIO INTRA EXTRACRAN SEL COM CAROTID INNOMINATE UNI R MOD SED 10/15/2016 Luanne Bras, MD [facility]  . MANDIBLE SURGERY    . RADIOLOGY WITH ANESTHESIA N/A 10/15/2016   Procedure: RADIOLOGY WITH ANESTHESIA;  Surgeon: Luanne Bras, MD;  Location: [facility];  Service: Radiology;  Laterality: N/A;   HPI:  Pt is a 66 y.o. male who presents to ED on 10/15/16 after being found down with R-side weakness and aphasia at home. 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 CXR 3/28 showed multiple R rib fxs. On 3/29, had R CCA and SCA thromboembolectomy. CXR 3/30 showed R basilar pneumothorax and emphysema with chest tube placement. Pt was  intubated 3/28 (failed extubation with resp arrest on 3/30) , extubated 10/22/16. 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    Assessment / Plan / Recommendation Clinical Impression  Patient presents with cognitive linguistic deficits in expressive and receptive language, as well as suspected cognitive communication deficits which are difficult to assess in the context of patient's language impairment. Administered Western Aphasia Battery- Revised Bedside form, overall score 60.8, indicating moderate severity. Clinical presentation is consisent with conduction aphasia, with deficits in following sequential commands, repetition and naming. Speech is characterized by fluent phonemic jargon, with significant word finding difficulty, neologisms, phonemic and  semantic paraphasias noted. Patient does demonstrate some error awareness but moderately unaware of errors. He is also noted to be impulsive, requiring occasional cues for attention. Suspect he may have additional  cognitive deficits which are difficult to assess given language impairment. Based on these findings, recommend pt receive 24 hour care/supervision for safety, and HH SLP to [employer] and cognition, improve safety awareness and quality of life. SLP to continue following acutely.    SLP Assessment  SLP Recommendation/Assessment: Patient needs continued Speech Lanaguage Pathology Services SLP Visit Diagnosis: Aphasia (R47.01);Cognitive communication deficit (R41.841)    Follow Up Recommendations  24 hour supervision/assistance;Home health SLP    Frequency and Duration min 2x/week  2 weeks      SLP Evaluation Cognition  Overall Cognitive Status: Impaired/Different from baseline Arousal/Alertness: Awake/alert Orientation Level: Oriented X4 Attention: Focused Focused Attention: Impaired Focused Attention Impairment: Verbal basic;Functional basic Memory:  (difficult to assess given language impairment) Awareness: Impaired (difficult to assess given language impairment) Awareness Impairment: Intellectual impairment Problem Solving:  (difficult to assess given language impairment) Executive Function:  (difficult to assess given language impairment) Behaviors: Impulsive Safety/Judgment: Impaired       Comprehension  Auditory Comprehension Overall Auditory Comprehension: Impaired Yes/No Questions: Impaired Basic Biographical Questions: 76-100% accurate Basic Immediate Environment Questions: 75-100% accurate Complex Questions: 75-100% accurate Commands: Impaired One Step Basic Commands: 75-100% accurate Two Step Basic Commands: 50-74% accurate Multistep Basic Commands: 50-74% accurate Conversation: Simple Other Conversation Comments: Fluent, phonemic jargon, frequent neologisms, reduced awareness of errors Visual Recognition/Discrimination Discrimination: Within Function Limits Reading Comprehension Reading Status: Impaired (Pt unable to read words or headlines from  newspaper) Word level: Impaired Sentence Level: Impaired Paragraph Level: Impaired Functional Environmental (signs, name badge): Impaired Interfering Components: Impulsivity    Expression Expression Primary Mode of Expression: Verbal Verbal Expression Overall Verbal Expression: Impaired Initiation: No impairment Automatic Speech: Name;Social Response Level of Generative/Spontaneous Verbalization: Conversation Repetition: Impaired Level of Impairment: Phrase level;Sentence level Naming: Impairment Confrontation: Impaired (9/20) Verbal Errors: Phonemic paraphasias;Semantic paraphasias;Neologisms;Jargon;Perseveration;Other (comment) (some error awareness) Pragmatics: No impairment Effective Techniques: Sentence completion Non-Verbal Means of Communication: Gestures Written Expression Dominant Hand: Right Written Expression: Exceptions to [facility] Dictation Ability: Letter Self Formulation Ability:  (Name only)   Oral / Motor  Oral Motor/Sensory Function Overall Oral Motor/Sensory Function: Within functional limits Motor Speech Overall Motor Speech: Appears within functional limits for tasks assessed   [place], MS CF-SLP Speech-Language Pathologist [phone]  Aliene Altes 10/25/2016, 4:37 PM

## 2016-10-26 DIAGNOSIS — E119 Type 2 diabetes mellitus without complications: Secondary | ICD-10-CM

## 2016-10-26 DIAGNOSIS — I69320 Aphasia following cerebral infarction: Secondary | ICD-10-CM

## 2016-10-26 LAB — CBC
HCT: 29.1 % — ABNORMAL LOW (ref 39.0–52.0)
Hemoglobin: 9.4 g/dL — ABNORMAL LOW (ref 13.0–17.0)
MCH: 28.3 pg (ref 26.0–34.0)
MCHC: 32.3 g/dL (ref 30.0–36.0)
MCV: 87.7 fL (ref 78.0–100.0)
PLATELETS: 246 10*3/uL (ref 150–400)
RBC: 3.32 MIL/uL — ABNORMAL LOW (ref 4.22–5.81)
RDW: 14.2 % (ref 11.5–15.5)
WBC: 9.1 10*3/uL (ref 4.0–10.5)

## 2016-10-26 LAB — GLUCOSE, CAPILLARY
GLUCOSE-CAPILLARY: 191 mg/dL — AB (ref 65–99)
GLUCOSE-CAPILLARY: 225 mg/dL — AB (ref 65–99)
Glucose-Capillary: 120 mg/dL — ABNORMAL HIGH (ref 65–99)
Glucose-Capillary: 236 mg/dL — ABNORMAL HIGH (ref 65–99)

## 2016-10-26 MED ORDER — IPRATROPIUM-ALBUTEROL 0.5-2.5 (3) MG/3ML IN SOLN
3.0000 mL | Freq: Three times a day (TID) | RESPIRATORY_TRACT | Status: DC
  Administered 2016-10-27 (×2): 3 mL via RESPIRATORY_TRACT
  Filled 2016-10-26 (×2): qty 3

## 2016-10-26 MED ORDER — POTASSIUM CHLORIDE 20 MEQ PO PACK
40.0000 meq | PACK | Freq: Once | ORAL | Status: AC
  Administered 2016-10-26: 40 meq via ORAL
  Filled 2016-10-26: qty 2

## 2016-10-26 MED ORDER — ALBUTEROL SULFATE (2.5 MG/3ML) 0.083% IN NEBU: 2.5000 mg | INHALATION_SOLUTION | Freq: Four times a day (QID) | RESPIRATORY_TRACT | Status: DC | PRN

## 2016-10-26 NOTE — Progress Notes (Signed)
Dr. Golden Hurter at bedside and updated on patient condition: remains aphasic, MOE x4, lungs auscultated with rales and wheezes-patient denies SOB or dyspnea and remains on RA with O2 sats >93%. Aware patient received ordered 40 MEQ KCL-no ectopy assess this am. Will repeat BMP in am. Continuing to monitor.

## 2016-10-26 NOTE — Progress Notes (Signed)
STROKE TEAM PROGRESS NOTE   SUBJECTIVE (INTERVAL HISTORY) Plan to transfer to floor today. Transitioned off the Amiodarone GTT to beta blocker for a fib. Started Lovenox. He denies new shortness of breath, chest pain, worsening weakness, new aphasia. He is still having LUE and LLE pain. His foley will be D/Ced today.   OBJECTIVE Temp:  [97.4 F (36.3 C)-99.6 F (37.6 C)] 99.5 F (37.5 C) (04/08 1256) Pulse Rate:  [79-106] 96 (04/08 1256) Cardiac Rhythm: Heart block;Normal sinus rhythm (04/08 1300) Resp:  [18-33] 20 (04/08 1256) BP: (95-141)/(41-111) 123/66 (04/08 1256) SpO2:  [90 %-99 %] 97 % (04/08 1256) Weight:  [68.9 kg (151 lb 14.4 oz)] 68.9 kg (151 lb 14.4 oz) (04/08 0500)   Recent Labs Lab 10/25/16 1149 10/25/16 1646 10/25/16 2123 10/26/16 0756 10/26/16 1140  GLUCAP 285* 294* 133* 120* 191*    Recent Labs Lab 10/21/16 0300 10/22/16 0420 10/23/16 0305 10/23/16 1708 10/24/16 0330 10/24/16 0343 10/25/16 0451  NA 143 141 140 134*  --  137 134*  K 3.6 3.4* 3.7 4.1  --  3.4* 3.5  CL 107 100* 99* 96*  --  97* 96*  CO2 23 32 32 28  --  28 27  GLUCOSE 229* 144* 94 224*  --  100* 107*  BUN 40* 38* 29* 30*  --  24* 22*  CREATININE 0.89 0.83 0.78 0.94  --  0.89 1.01  CALCIUM 9.0 8.9 9.4 9.3  --  9.2 9.2  MG 1.8 1.7 1.8 2.2 2.0  --  1.8  PHOS 1.5* 3.3 3.5  --   --  3.5 3.1    Recent Labs Lab 10/21/16 0300 10/22/16 0420 10/23/16 0305 10/24/16 0343 10/25/16 0451  ALBUMIN 2.2* 2.2* 2.4* 2.2* 2.0*    Recent Labs Lab 10/21/16 0300 10/22/16 0420 10/23/16 0305 10/24/16 0659 10/25/16 0451 10/26/16 0317  WBC 5.7 8.6 10.5 15.3* 12.2* 9.1  NEUTROABS 3.6 6.5 8.6* 13.1* 10.7*  --   HGB 9.4* 9.9* 10.1* 10.4* 9.2* 9.4*  HCT 28.5* 30.6* 31.6* 31.8* 28.6* 29.1*  MCV 90.2 88.7 89.8 88.3 88.5 87.7  PLT 250 269 275 267 240 246    Recent Labs  10/23/16 1522 10/24/16 0330  LABPROT 14.5 16.4*  INR 1.12 1.31     PHYSICAL EXAM General: Vital signs reviewed.   Patient is Chronically ill appearing, appears older than stated age, pleasant.  Cardiovascular: mildly tachycardiac, regular.  Pulmonary/Chest: decreased breath sounds and mild rales at R>L lung base. Bruising noted over R subclavian area.  Abdominal: Soft, non-tender, non-distended, BS +.  Extremities: No lower extremity edema bilaterally, pulses symmetric and intact bilaterally. Neurological Exam :Awake, alert.  . Patient moves all extremities.well equally without any focal weakness Normal sensation throughout.    Following commands, with some difficultly with peripheral commands. Has word finding difficulties and expressive aphasia with some trouble with naming and repetition (stable compared to 4/7). Extraocular muscles intact, PERRLA.no focal weakness. Tremulous noted. Sensation intact to light touch throughout in all 4 extremities.   ASSESSMENT/PLAN Mr. Ralph Lopez is a 66 y.o. male with history of hypertension, type 2 diabetes, hyperlipidemia, CKD, COPD, stage IV lung cancer undergoing active treatment admitted for achalasia, right sided weakness and facial droop and found to have an acute left MCA CVA status post revascularization.   Acute left MCA Stroke with L CC thrombus s/p TICI3 revascularization. Infarct embolic due to atrial fibrillation vs hypercoagulable state from lung cancer.    Resultant aphasia, dysphagia  MRI: Left  MCA distribution diffusion restriction is present in the left insula, left anterior temporal lobe, and left lateral frontal lobe in distribution similar to CT perfusion CBF less than 30%.   CT Perfusion: The acute infarct involving the left MCA territory with estimated volume of CT a less than 30% at 43 mL. Mismatch volume 131 mL.  CT Angio head/neck: Thrombus in the distal left M1 segment to the bifurcation. Flow is present in reduced caliber left MCA branch vessels. The thrombus is likely nonocclusive. Saddle embolus at the innominate artery bifurcation into  the right common carotid artery and right subclavian artery. This embolus is nonocclusive.   Cerebral angio complete revascularization of left MCA occlusion with 1 pass with the Solitaire FR 4 mm x 40 mm retrieval device achieving a TICI 3 reperfusion  CT Head: Improvement in left MCA hypodensity following clot retrieval.   Echo: 40-08%, grade 1 diastolic, no evidence of vegetation  LDL 59  HgbA1c 5.8  Lovenox for VTE prophylaxis   DIET DYS 3 Room service appropriate? Yes; Fluid consistency: Thin   aspirin 81 mg daily prior to admission, now on heparin IV.  Switch to therapeutic Lovenox 4/7 given history of cancer and arrhythmia   Therapy recommendations:  CIR-> improving. Likely will only need HH at discharge  Disposition:  Pending  Left Common Carotid Thrombus Non-Occlusive s/p Revascularization with IR Right Common Carotid Thrombus and Right Subclavian Thrombus and Stenosis Non-Occlusive  CT Angio head/neck: Thrombus in the distal left M1 segment to the bifurcation. Flow is present in reduced caliber left MCA branch vessels. The thrombus is likely nonocclusive. Saddle embolus at the innominate artery bifurcation into the right common carotid artery and right subclavian artery. This embolus is nonocclusive.  S/p Right CC and R subclavian artery thrombectomy with removal of large gelatinous single embolus with vascular surgery on 3/30- unclear if blood clot or tissue- sent to pathology  Follow up Dr. Donzetta Matters in 2-3 week  Arrythmia, possible atrial fibrillation/AFlutter/SVT  New 10/23/2016. Seen on tele. Not confirmed with EKG  Placed on amiodarone --> transitioned to lopressor on 4/7   Hyperglycenia  hgbA1c 5.8, goal < 7.0  Felt to be due to steroids  CBG monitoring  Blood sugars stable. On schedule Novolog as well as SSI     Hypertension  Home meds:   Amlodipine 5 mg daily, atenolol 100 mg daily, lisinopril 40 mg daily  Variable - mainly in the high 90's to low  100's.   Continue to monitor  Hyperlipidemia  Home meds:  Atorvastatin 10 mg daily, resumed  LDL 59, goal < 70  Continue statin at discharge  Other Stroke Risk Factors  History of tobacco abuse  Active malignancy  UDS not performed  Other Active Problems  Chronic normocytic anemia-hemoglobin 14.4  Mild leukocytosis -> resolved   Cardiac arrest 2/2 to respiratory arrest during extubation  Possible COPD exacerbation. On duonebs and prendisone. Recommend a 1 week taper --> repeat CXR in AM   Chronic renal failure/Urinary retention. New flomax --> will D/C foley today   Hypokalemia, replaced. Continue to monitor. --> will check labs in AM   Stage IV Lung Cancer  Has been treated with carboplatin and Taxol, given new evidence of metastasis to left hip, plans are to start radiation therapy  Hospital day # 11  I have personally examined this patient, reviewed notes, independently viewed imaging studies, participated in medical decision making and plan of care.ROS completed by me personally and pertinent positives fully documented  I have made any additions or clarifications directly to the above note.  Neurologically stable, has aphasia that has been stable / persistent this weekend. His main issues remain non-neurological. His blood sugars are stable on his current regimen of small schedule insulin with SSI, this will hopefully improve after he is titrated off his Prednisone at discharge for his COPD exacerbation. We will repeat a CXR in AM to make sure his lung bases / atelectasis are stable since he is a few days out from chest tube removal. We will also check his K and Mg to make sure they haven't dropped given he was having PVCs over the weekend. We will D/C foley today that was in place for urinary retention / urgency, he is now on flomax. He has been transitioned to beta blocker instead of amiodarone for atrial fibrillation and is doing well on therapeutic Lovenox for  anticoagulation. He may be able to be d/ced home with home health on 10/27/16 if he remains stable.   Loney Hering, D.O.  [facility]   To contact Stroke Continuity provider, please refer to [email or web address] After hours, contact General Neurology

## 2016-10-26 NOTE — Progress Notes (Signed)
Patient to transfer to 5M15. Report called to [facility], Therapist, sports. Nursing to continue to monitor.

## 2016-10-27 ENCOUNTER — Inpatient Hospital Stay (HOSPITAL_COMMUNITY): Payer: BLUE CROSS/BLUE SHIELD

## 2016-10-27 DIAGNOSIS — I63312 Cerebral infarction due to thrombosis of left middle cerebral artery: Secondary | ICD-10-CM

## 2016-10-27 DIAGNOSIS — C3490 Malignant neoplasm of unspecified part of unspecified bronchus or lung: Secondary | ICD-10-CM

## 2016-10-27 LAB — CBC
HEMATOCRIT: 29.7 % — AB (ref 39.0–52.0)
Hemoglobin: 9.6 g/dL — ABNORMAL LOW (ref 13.0–17.0)
MCH: 28.5 pg (ref 26.0–34.0)
MCHC: 32.3 g/dL (ref 30.0–36.0)
MCV: 88.1 fL (ref 78.0–100.0)
PLATELETS: 216 10*3/uL (ref 150–400)
RBC: 3.37 MIL/uL — AB (ref 4.22–5.81)
RDW: 13.9 % (ref 11.5–15.5)
WBC: 7.5 10*3/uL (ref 4.0–10.5)

## 2016-10-27 LAB — BASIC METABOLIC PANEL
ANION GAP: 7 (ref 5–15)
BUN: 17 mg/dL (ref 6–20)
CO2: 29 mmol/L (ref 22–32)
Calcium: 9.4 mg/dL (ref 8.9–10.3)
Chloride: 99 mmol/L — ABNORMAL LOW (ref 101–111)
Creatinine, Ser: 1.03 mg/dL (ref 0.61–1.24)
Glucose, Bld: 127 mg/dL — ABNORMAL HIGH (ref 65–99)
POTASSIUM: 4.1 mmol/L (ref 3.5–5.1)
Sodium: 135 mmol/L (ref 135–145)

## 2016-10-27 LAB — MAGNESIUM: MAGNESIUM: 2 mg/dL (ref 1.7–2.4)

## 2016-10-27 LAB — GLUCOSE, CAPILLARY
GLUCOSE-CAPILLARY: 108 mg/dL — AB (ref 65–99)
Glucose-Capillary: 142 mg/dL — ABNORMAL HIGH (ref 65–99)

## 2016-10-27 MED ORDER — ENOXAPARIN (LOVENOX) PATIENT EDUCATION KIT
PACK | Freq: Once | Status: AC
  Administered 2016-10-27: 17:00:00
  Filled 2016-10-27: qty 1

## 2016-10-27 MED ORDER — ENSURE ENLIVE PO LIQD: 237.0000 mL | Freq: Two times a day (BID) | ORAL | 12 refills | Status: AC

## 2016-10-27 MED ORDER — TAMSULOSIN HCL 0.4 MG PO CAPS: 0.4000 mg | ORAL_CAPSULE | Freq: Every day | ORAL | 2 refills | Status: AC

## 2016-10-27 MED ORDER — PREDNISONE 10 MG PO TABS: 40.0000 mg | ORAL_TABLET | Freq: Every day | ORAL | 0 refills | Status: AC

## 2016-10-27 MED ORDER — ENOXAPARIN SODIUM 80 MG/0.8ML ~~LOC~~ SOLN: 1.0000 mg/kg | Freq: Two times a day (BID) | SUBCUTANEOUS | 2 refills | Status: AC

## 2016-10-27 MED ORDER — IPRATROPIUM-ALBUTEROL 0.5-2.5 (3) MG/3ML IN SOLN: 3.0000 mL | Freq: Three times a day (TID) | RESPIRATORY_TRACT | 0 refills | Status: AC

## 2016-10-27 MED ORDER — OXYCODONE HCL 10 MG PO TABS: 5.0000 mg | ORAL_TABLET | ORAL | 0 refills | Status: DC | PRN

## 2016-10-27 MED ORDER — METOPROLOL TARTRATE 25 MG PO TABS: 25.0000 mg | ORAL_TABLET | Freq: Three times a day (TID) | ORAL | 2 refills | Status: AC

## 2016-10-27 NOTE — Discharge Summary (Signed)
Stroke Discharge Summary  Patient ID: Ralph Lopez   MRN: [id number]      DOB: 02-12-1951  Date of Admission: 10/15/2016 Date of Discharge: 10/27/2016  Attending Physician:  Rosalin Hawking, MD, Stroke MD Consultant(s):   Jennet Maduro, MD (pulmonary/intensive care), Servando Snare, MD (vascular surgery), Alysia Penna, MD (Physical Medicine & Rehabtilitation)  Patient's PCP:  Kathrine Haddock, NP  DISCHARGE DIAGNOSIS:    Stroke (cerebrum) [facility]) - Acute left MCA Stroke with L CC thrombus s/p TICI3 revascularization. Infarct embolic due to atrial fibrillation vs hypercoagulable state from lung cancer.   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 On duonebs and prendisone. Will taper steroid over 1 week   Chronic renal failure/Urinary retention   Hypokalemia   Hypypo magensium   Rib fractures  Past Medical History:  Diagnosis Date  . Anxiety   . Cancer ([place])    Stage IV Lung  . Chronic kidney disease   . COPD (chronic obstructive pulmonary disease) ([place])   . Depression   . Diabetes mellitus without complication ([place])   . Hyperlipidemia   . Hypertension    Past Surgical History:  Procedure Laterality Date  . CAROTID-SUBCLAVIAN BYPASS GRAFT Right 10/16/2016   Procedure: RIGHT CAROTID-SUBCLAVIAN THROMBECTOMY;  Surgeon: Waynetta Sandy, MD;  Location: [place];   Service: Vascular;  Laterality: Right;  . EYE SURGERY    . FLEXIBLE BRONCHOSCOPY N/A 04/28/2016   Procedure: FLEXIBLE BRONCHOSCOPY;  Surgeon: [name], MD;  Location: [facility] ORS;  Service: Pulmonary;  Laterality: N/A;  . IR GENERIC HISTORICAL  10/15/2016   IR PERCUTANEOUS ART THROMBECTOMY/INFUSION INTRACRANIAL INC DIAG ANGIO 10/15/2016 Luanne Bras, MD [facility]  . IR GENERIC HISTORICAL  10/15/2016   IR ANGIOGRAM EXTREMITY RIGHT 10/15/2016 Luanne Bras, MD [facility]  . IR GENERIC HISTORICAL  10/15/2016   IR ANGIO INTRA EXTRACRAN SEL COM CAROTID INNOMINATE UNI R MOD SED 10/15/2016 Luanne Bras, MD [facility]  . MANDIBLE SURGERY    . RADIOLOGY WITH ANESTHESIA N/A 10/15/2016   Procedure: RADIOLOGY WITH ANESTHESIA;  Surgeon: Luanne Bras, MD;  Location: [place];  Service: Radiology;  Laterality: N/A;    Allergies as of 10/27/2016   No Known Allergies     Medication List    STOP taking these medications   amLODipine 5 MG tablet Commonly known as:  NORVASC   aspirin EC 81 MG tablet   atenolol 100 MG tablet Commonly known as:  TENORMIN   lisinopril 40 MG tablet Commonly known as:  PRINIVIL,ZESTRIL   methylPREDNISolone 4 MG Tbpk tablet Commonly known as:  MEDROL DOSEPAK   prochlorperazine 10 MG tablet Commonly known as:  COMPAZINE   umeclidinium-vilanterol 62.5-25 MCG/INH Aepb Commonly known as:  ANORO ELLIPTA   zolpidem 5 MG tablet Commonly known as:  AMBIEN     TAKE these medications   albuterol 108 (  90 Base) MCG/ACT inhaler Commonly known as:  PROVENTIL HFA;VENTOLIN HFA Inhale 2 puffs into the lungs every 6 (six) hours as needed for wheezing or shortness of breath.   atorvastatin 10 MG tablet Commonly known as:  LIPITOR Take 10 mg by mouth daily.   BYDUREON 2 MG Pen Generic drug:  Exenatide ER INJECT '2MG'$  SUBCUTANEOUSLY WEEKLY   enoxaparin 80 MG/0.8ML injection Commonly known as:  LOVENOX Inject 0.7 mLs (70 mg total) into the skin every  12 (twelve) hours.   feeding supplement (ENSURE ENLIVE) Liqd Take 237 mLs by mouth 2 (two) times daily between meals. Start taking on:  10/28/2016   Insulin Glargine 300 UNIT/ML Sopn Commonly known as:  TOUJEO SOLOSTAR Inject 30 Units as directed daily.   ipratropium-albuterol 0.5-2.5 (3) MG/3ML Soln Commonly known as:  DUONEB Take 3 mLs by nebulization 3 (three) times daily.   LORazepam 0.5 MG tablet Commonly known as:  ATIVAN Take 1 tablet (0.5 mg total) by mouth 2 (two) times daily.   metoprolol tartrate 25 MG tablet Commonly known as:  LOPRESSOR Take 1 tablet (25 mg total) by mouth 3 (three) times daily.   ondansetron 8 MG tablet Commonly known as:  ZOFRAN Take 1 tablet by mouth 2 (two) times daily as needed.   Oxycodone HCl 10 MG Tabs Take 0.5-1 tablets (5-10 mg total) by mouth every 4 (four) hours as needed. What changed:  how much to take   pantoprazole 40 MG tablet Commonly known as:  PROTONIX Take 1 tablet (40 mg total) by mouth daily.   predniSONE 10 MG tablet Commonly known as:  DELTASONE Take 4 tablets (40 mg total) by mouth daily with breakfast. Taper dose: '40mg'$  x 2 days, 30 mg x 2 days, '20mg'$  x 2 days, 10 mg x 2 days, 5 mg (1/2 tab) x 2 days then stop Start taking on:  10/28/2016   ranitidine 150 MG tablet Commonly known as:  ZANTAC Take 150 mg by mouth daily as needed for heartburn.   tamsulosin 0.4 MG Caps capsule Commonly known as:  FLOMAX Take 1 capsule (0.4 mg total) by mouth daily. Start taking on:  10/28/2016            Durable Medical Equipment        Start     Ordered   10/27/16 1501  For home use only DME 3 n 1  Once     10/27/16 1500      LABORATORY STUDIES CBC    Component Value Date/Time   WBC 7.5 10/27/2016 0426   RBC 3.37 (L) 10/27/2016 0426   HGB 9.6 (L) 10/27/2016 0426   HGB 12.8 (L) 04/25/2014 0407   HCT 29.7 (L) 10/27/2016 0426   HCT 33.6 (L) 07/07/2016 0950   PLT 216 10/27/2016 0426   PLT 329 07/07/2016 0950    MCV 88.1 10/27/2016 0426   MCV 86 07/07/2016 0950   MCV 96 04/25/2014 0407   MCH 28.5 10/27/2016 0426   MCHC 32.3 10/27/2016 0426   RDW 13.9 10/27/2016 0426   RDW 19.0 (H) 07/07/2016 0950   RDW 13.3 04/25/2014 0407   LYMPHSABS 0.3 (L) 10/25/2016 0451   LYMPHSABS 0.5 (L) 07/07/2016 0950   LYMPHSABS 0.6 (L) 04/25/2014 0407   MONOABS 1.2 (H) 10/25/2016 0451   MONOABS 0.6 04/25/2014 0407   EOSABS 0.1 10/25/2016 0451   EOSABS 0.0 04/25/2014 0407   BASOSABS 0.0 10/25/2016 0451   BASOSABS 0.0 04/25/2014 0407   CMP    Component Value  Date/Time   NA 135 10/27/2016 0426   NA 141 07/07/2016 0950   NA 139 04/25/2014 0407   K 4.1 10/27/2016 0426   K 3.6 04/25/2014 0407   CL 99 (L) 10/27/2016 0426   CL 111 (H) 04/25/2014 0407   CO2 29 10/27/2016 0426   CO2 24 04/25/2014 0407   GLUCOSE 127 (H) 10/27/2016 0426   GLUCOSE 177 (H) 04/25/2014 0407   BUN 17 10/27/2016 0426   BUN 21 07/07/2016 0950   BUN 29 (H) 04/25/2014 0407   CREATININE 1.03 10/27/2016 0426   CREATININE 1.50 (H) 04/25/2014 0407   CALCIUM 9.4 10/27/2016 0426   CALCIUM 8.4 (L) 04/25/2014 0407   PROT 7.0 10/15/2016 1143   PROT 6.8 07/07/2016 0950   ALBUMIN 2.0 (L) 10/25/2016 0451   ALBUMIN 3.7 07/07/2016 0950   AST 18 10/15/2016 1143   ALT 8 (L) 10/15/2016 1143   ALKPHOS 53 10/15/2016 1143   BILITOT 0.5 10/15/2016 1143   BILITOT <0.2 07/07/2016 0950   GFRNONAA >60 10/27/2016 0426   GFRNONAA 43 (L) 04/24/2014 0052   GFRAA >60 10/27/2016 0426   GFRAA 52 (L) 04/24/2014 0052   COAGS Lab Results  Component Value Date   INR 1.31 10/24/2016   INR 1.12 10/23/2016   INR 1.09 10/15/2016   Lipid Panel    Component Value Date/Time   CHOL 112 10/16/2016 0557   CHOL 153 01/02/2016 0952   CHOL 135 07/03/2015 1055   TRIG 86 10/22/2016 0420   TRIG 94 07/03/2015 1055   HDL 25 (L) 10/16/2016 0557   HDL 30 (L) 01/02/2016 0952   CHOLHDL 4.5 10/16/2016 0557   VLDL 28 10/16/2016 0557   VLDL 19 07/03/2015 1055   LDLCALC 59  10/16/2016 0557   LDLCALC 97 01/02/2016 0952   HgbA1C  Lab Results  Component Value Date   HGBA1C 5.8 (H) 10/16/2016   Urinalysis    Component Value Date/Time   COLORURINE STRAW (A) 10/15/2016 1721   APPEARANCEUR CLEAR 10/15/2016 1721   LABSPEC 1.027 10/15/2016 1721   PHURINE 6.0 10/15/2016 1721   GLUCOSEU NEGATIVE 10/15/2016 1721   HGBUR NEGATIVE 10/15/2016 1721   BILIRUBINUR NEGATIVE 10/15/2016 1721   KETONESUR NEGATIVE 10/15/2016 1721   PROTEINUR NEGATIVE 10/15/2016 1721   NITRITE NEGATIVE 10/15/2016 1721   LEUKOCYTESUR NEGATIVE 10/15/2016 1721   Urine Drug Screen     Component Value Date/Time   LABOPIA NONE DETECTED 10/15/2016 1720   COCAINSCRNUR NONE DETECTED 10/15/2016 1720   LABBENZ NONE DETECTED 10/15/2016 1720   AMPHETMU NONE DETECTED 10/15/2016 1720   THCU NONE DETECTED 10/15/2016 1720   LABBARB NONE DETECTED 10/15/2016 1720    Alcohol Level    Component Value Date/Time   ETH <5 10/15/2016 1143     SIGNIFICANT DIAGNOSTIC STUDIES Ct Head Code Stroke W/o Cm  3/28 1. Acute nonhemorrhagic infarct involving the left MCA territory with involvement of the left insular cortex, left frontal operculum, left temporal tip, and left super ganglionic frontal lobe. 2. ASPECTS is 7/10  CTA HEAD/NECK 3/28: 1. Thrombus in the distal left M1 segment to the bifurcation. Flow is present in reduced caliber left MCA branch vessels. The thrombus is likely nonocclusive. Collateral vessels are also present. 2. Saddle embolus at the innominate artery bifurcation into the right common carotid artery and right subclavian artery. This embolus is nonocclusive. 3. Atherosclerotic changes at the carotid bifurcations and cavernous internal carotid arteries bilaterally without significant stenosis. 4. Atherosclerotic calcifications with moderate stenosis in the proximal left  vertebral artery. Ct Cerebral Perfusion W Contrast 3/28  1. The acute infarct involving the left MCA territory with  estimated volume of CT a less than 30% at 43 mL. 2. Mismatch volume 131 mL.  Cerebral Angio 3/29  Status post endovascular complete revascularization of left MCA occlusion with 1 pass with the Solitaire FR 4 mm x 40 mm retrieval device achieving a TICI 3 reperfusion.  Ct Head Wo Contrast 3/28 Improvement in left MCA hypodensity following clot retrieval. There remains low-density in the left inferior temporal lobe and insular cortex with edema which has improved since earlier today. No acute hemorrhage.  Mild midline shift to the right.  MRI BRAIN W/O 3/29: Left MCA distribution diffusion restriction is present in the left insula, left anterior temporal lobe, and left lateral frontal lobe in distribution similar to CT perfusion CBF less than 30%. There is a small additional area of diffusion restriction within the left parietal lobe not indicated on the perfusion study. Findings are compatible with acute infarction. No associated hemorrhage identified. CT CHEST W/O 3/30: 1. Moderate-sized RIGHT pneumothorax as described on radiograph same day. 2. 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 3. Endotracheal tube in good position. 4. RIGHT suprahilar mass. 5. Bilateral round pulmonary metastasis significantly enlarged from prior PET-CT scan. Dg Hip Unilat With Pelvis 2-3 Views Left 3/23 Large lytic lesion seen involving the left superior and inferior pubic rami consistent with metastatic lesion.  PORT CXR 3/28 Multiple right rib fractures. Endotracheal tube in satisfactory position. Changes consistent with lung carcinoma and multiple scattered pulmonary lesions. These have progressed in the interval from the prior exam of 05/02/2016 PORT CXR 3/29 High positioning of the endotracheal tube. Advancement by at least 5 cm would be useful. The remainder the findings in the chest are stable.  PORT CXR 3/30 Endotracheal tube in grossly good position. Stable large  right hilar mass and multiple left pulmonary nodules are noted concerning for metastatic disease. Interval development of mild right basilar pneumothorax with associated subcutaneous emphysema overlying right lateral chest wall. Right rib fractures are again noted.  PORT CXR 3/30 Again noted is right hilar mass. Nodular metastasis left lung are stable. Endotracheal tube in place with tip 3.4 cm above the carina. Extensive subcutaneous emphysema right chest wall again noted. Stable small right lateral pneumothorax. PORT CXR 3/30 1. Interval right chest tube placement with decreased size of right pneumothorax. 2. Unchanged right hilar mass and lung nodules.  PORT CXR 3/31 1. Support apparatus remains in place. No definitive right-sided pneumothorax but support apparatus obscures the right apex. 2. Unchanged right hilar mass and lung nodules. 3. Extensive subcutaneous air in the right neck and lateral chest wall.  PORT CXR 4/1: No appreciable right pneumothorax. Right-sided chest tube in place. Right hilar mass noted. Endotracheal tube and enteric feeding tube in good position. PORT CXR 4/2 Right chest tube is unchanged in position. Again noted right perihilar mass. No pneumothorax. Multiple lung nodules are stable from prior exam. Stable endotracheal and NG tube position. PORT CXR 4/3 Stable right hilar and left mid and lower lung parenchymal masses. No right-sided pneumothorax is evident today. There is no pleural effusion. The support tubes are in reasonable position.  PORT CXR 4/4:  Right hilar mass and left lung masses noted. No pleural effusion appreciated. Endotracheal tube in stable position. PORT CXR 4/5 1. No pneumothorax. 2. Unchanged appearance of the lungs including bilateral masses.  PORT CXR 4/6 No pneumothorax with  chest tube unchanged in position. Widespread pulmonary metastases. Dominant mass on the right with adjacent consolidation in the right mid lung stable. Stable cardiac silhouette.  There is aortic atherosclerosis.  PORT CXR 4/6 1. Bilateral lung masses. No pneumothorax seen today. Persistent right rib fractures.  PORT CXR 4/7 1. Removal of right-sided chest tube. No definitive right pneumothorax 2. Grossly stable appearance of the thorax with bilateral lung masses  PORT CXR 4/9 Persistent volume loss the right with elevation the right hemidiaphragm. Large mass in the medial right mid lung with adjacent consolidation. Multiple rounded metastatic foci noted bilaterally. Stable cardiac silhouette. There is aortic atherosclerosis. Rib fractures on the right appear stable. No pneumothorax evident.     HISTORY OF PRESENT ILLNESS Ralph Lopez is a 66 y.o. male with a history of diabetes, hypertension, hyperlipidemia who presents with right-sided weakness and aphasia. He was last seen well around 3 AM on 10/15/2016, when he got up to go the bathroom. This morning, he apparently walked to the kitchen but then is having trouble and fell. When his wife first saw him this morning, he was already aphasic and on the ground. When EMS first arrived they found him to be completely plegic on the right side and this improved en route. On arrival, he still did have some right-sided weakness and facial droop and was very densely aphasic. He did not received tPA as he arrived beyond the window.  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  As for his cancer, he is undergoing active treatment with carboplatin and Taxol. He had radiation therapy and is planning to undergo repeat PET soon. He is not in hospice or had a discussed with him.   HOSPITAL COURSE Mr. ARLESS VINEYARD is a 66 y.o. male with history of hypertension, type 2 diabetes, hyperlipidemia, CKD, COPD, stage IV lung cancer undergoing active treatment admitted for achalasia, right sided weakness and facial droop and found to  have an acute left MCA CVA status post revascularization.   Acute left MCA Stroke with L CC thrombus s/p TICI3 revascularization. Infarct embolic due to atrial fibrillation vs hypercoagulable state from lung cancer.    Resultant aphasia, dysphagia  MRI: Left MCA distribution diffusion restriction is present in the left insula, left anterior temporal lobe, and left lateral frontal lobe in distribution similar to CT perfusion CBF less than 30%.   CT Perfusion: The acute infarct involving the left MCA territory with estimated volume of CT a less than 30% at 43 mL. Mismatch volume 131 mL.  CT Angio head/neck: Thrombus in the distal left M1 segment to the bifurcation. Flow is present in reduced caliber left MCA branch vessels. The thrombus is likely nonocclusive. Saddle embolus at the innominate artery bifurcation into the right common carotid artery and right subclavian artery. This embolus is nonocclusive.   Cerebral angio complete revascularization of left MCA occlusion with 1 pass with the Solitaire FR 4 mm x 40 mm retrieval device achieving a TICI 3 reperfusion  CT Head: Improvement in left MCA hypodensity following clot retrieval.   Echo: 99-83%, grade 1 diastolic, no evidence of vegetation  LDL 59  HgbA1c 5.8  aspirin 81 mg daily prior to admission, treated with heparin IV in the hospital. Changed to therapeutic Lovenox for secondary stroke prevention given cancer dx.  Therapy recommendations:  CIR-> improved. HH PT and OT at  discharge  Disposition:  return home   Acute Hypoxic Respiratory Failure  03/28 - Admit w/ revascularization for L MCA CVA. Intubated for procedure  03/30 - Extubated & had respiratory arrest that was <43mn w/ CPR & no drugs. Awake post arrest per report.  04/05 - Starting clamping trials with right chest tube  CCM managed  Left Common Carotid Thrombus Non-Occlusive s/p Revascularization with IR Right Common Carotid Thrombus and Right Subclavian  Thrombus and Stenosis Non-Occlusive  CT Angio head/neck: Thrombus in the distal left M1 segment to the bifurcation. Flow is present in reduced caliber left MCA branch vessels. The thrombus is likely nonocclusive. Saddle embolus at the innominate artery bifurcation into the right common carotid artery and right subclavian artery. This embolus is nonocclusive.  S/p Right CC and R subclavian artery thrombectomy with removal of large gelatinous single embolus with vascular surgery on 3/30- unclear if blood clot or tissue- sent to pathology  Incision healing nicely  Follow up Dr. CDonzetta Mattersin 2-3 week  Arrythmia, intermittent. Possible atrial fibrillation/AFlutter/SVT  New 10/23/2016. Seen on tele. Not confirmed with EKG  Placed on IV amiodarone for rate control. Transitioned to lopressor 10/25/16  Discharge on full dose Lovenox.   Consider prolonged cardiac monitoring as an outpt to see arrhythmia burden         Hyperglycenia  hgbA1c 5.8, goal < 7.0  Felt to be due to steroids  CBG monitoring  5U Novolog with meals added during hospitalization. Often held due to low glucose. Given planned taper of steroids, will not administer at discharge  Hypertension  Home meds:   Amlodipine 5 mg daily, atenolol 100 mg daily  BP variable in hospital  Will not resume amlodipine at discharge  Atenolol changed to 25 mg tid in hospital  Hyperlipidemia  Home meds:  Atorvastatin 10 mg daily, resumed  LDL 59, goal < 70  Continue statin at discharge  Other Stroke Risk Factors  History of tobacco abuse  Active malignancy  UDS not performed  Stage IV Lung Cancer  Has been treated with carboplatin and Taxol, given new evidence of metastasis to left hip, plans are to start radiation therapy to pelvic mass  Other Active Problems  Chronic normocytic anemia-hemoglobin 14.4  Mild leukocytosis -> resolved   Cardiac arrest 2/2 to respiratory arrest during extubation  Right pneumothorax ->  chest tube -> removed   Possible COPD exacerbation. On duonebs and prendisone. Will taper steroid over 1 week  Chronic renal failure/Urinary retention. New flomax  Hypokalemia, repeat draw 3.5, repleted, 4.1 day of discharge  Hypypo magensium, 1.7, repleted, 2.0 day of discharge  Rib fractures   DISCHARGE EXAM Blood pressure 109/67, pulse (!) 103, temperature 97.5 F (36.4 C), temperature source Oral, resp. rate 20, height '5\' 10"'$  (1.778 m), weight 68.9 kg (151 lb 12.8 oz), SpO2 98 %. General: Vital signs reviewed.  Patient is Chronically ill appearing, appears older than stated age, pleasant.  Cardiovascular: mildly tachycardiac, regular.  Pulmonary/Chest:crackles noted of R lung base. Bruising noted over R subclavian area.  Abdominal: Soft, non-tender, non-distended, BS +.  Extremities: No lower extremity edema bilaterally, pulses symmetric and intact bilaterally. Neurological Exam :Awake, alert.  . Patient moves all extremities.well equally without any focal weakness Normal sensation throughout.    Following commands, with some difficultly with peripheral commands. Has word finding difficulties and expressive aphasia with some trouble with naming and repetition. Extraocular muscles intact, PERRLA.no focal weakness. Tremulous noted. Sensation intact to light touch throughout in all 4  extremities.   Discharge Diet   DIET DYS 3 Room service appropriate? Yes; Fluid consistency: Thin liquids  DISCHARGE PLAN  Disposition:  Return home  Home health PT and OT  full dose lovenox for secondary stroke prevention.  Ongoing risk factor control by Primary Care Physician at time of discharge  Follow-up Delight Hoh, MD 11/05/2016 at 1015a  Follow up Dr. Donzetta Matters 11/14/2016 at [place] with Dr. Antony Contras, Stroke Clinic in 6 weeks, office to schedule an appointment.  45 minutes were spent preparing discharge.  Rosalin Hawking, MD PhD Stroke Neurology 10/28/2016 4:57 PM

## 2016-10-27 NOTE — Progress Notes (Signed)
  Speech Language Pathology Treatment: Dysphagia;Cognitive-Linquistic  Patient Details Name: Ralph Lopez MRN: [id number] DOB: 1951/05/26 Today's Date: 10/27/2016 Time: 8341-9622 SLP Time Calculation (min) (ACUTE ONLY): 10 min  Assessment / Plan / Recommendation Clinical Impression  Pt consumed soft solids and thin liquids with Min cues for slower pacing to reduce aspiration risk. No overt signs of difficulty or airway compromise were noted. He remains moderately aphasic with intermittent clear words mixed into jargon. Recommend [place] SLP f/u and 24/7 supervision upon d/c.   HPI HPI: Pt is a 66 y.o. male who presents to ED on 10/15/16 after being found down with R-side weakness and aphasia at home. 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 CXR 3/28 showed multiple R rib fxs. On 3/29, had R CCA and SCA thromboembolectomy. CXR 3/30 showed R basilar pneumothorax and emphysema with chest tube placement. Pt was  intubated 3/28 (failed extubation with resp arrest on 3/30) , extubated 10/22/16. 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       SLP Plan  Continue with current plan of care       Recommendations  Diet recommendations: Dysphagia 3 (mechanical soft);Thin liquid Liquids provided via: Cup;Straw Medication Administration: Whole meds with puree Supervision: Patient able to self feed;Full supervision/cueing for compensatory strategies Compensations: Slow rate;Small sips/bites Postural Changes and/or Swallow Maneuvers: Seated upright 90 degrees;Upright 30-60 min after meal                Oral Care Recommendations: Oral care BID Follow up Recommendations: 24 hour supervision/assistance;Home health SLP SLP Visit Diagnosis: Aphasia (R47.01);Dysphagia, oropharyngeal phase (R13.12) Plan: Continue with current plan of care       GO                Ralph Lopez 10/27/2016, 11:47 AM  Ralph Lopez, M.A. CCC-SLP [phone]

## 2016-10-27 NOTE — Progress Notes (Signed)
PT Cancellation Note  Patient Details Name: Ralph Lopez MRN: [id number] DOB: 25-May-1951   Cancelled Treatment:    Reason Eval/Treat Not Completed: Other (comment) (Pt fatigued from ambulating to and from the bathroom.  Will f/u after patient has had time to recover.  )   Shirla Hodgkiss [name] 10/27/2016, 2:03 PM  [name], PTA pager [phone]

## 2016-10-27 NOTE — Progress Notes (Signed)
  Progress Note    10/27/2016 9:07 AM 11 Days Post-Op  Subjective:  No complaints  Vitals:   10/27/16 0121 10/27/16 0546  BP: 114/81 113/70  Pulse:  82  Resp: 18 16  Temp:  98 F (36.7 C)    Physical Exam: aaox3 Right supraclavicular incision is cdi Palpable brachial and radial pulses on right  CBC    Component Value Date/Time   WBC 7.5 10/27/2016 0426   RBC 3.37 (L) 10/27/2016 0426   HGB 9.6 (L) 10/27/2016 0426   HGB 12.8 (L) 04/25/2014 0407   HCT 29.7 (L) 10/27/2016 0426   HCT 33.6 (L) 07/07/2016 0950   PLT 216 10/27/2016 0426   PLT 329 07/07/2016 0950   MCV 88.1 10/27/2016 0426   MCV 86 07/07/2016 0950   MCV 96 04/25/2014 0407   MCH 28.5 10/27/2016 0426   MCHC 32.3 10/27/2016 0426   RDW 13.9 10/27/2016 0426   RDW 19.0 (H) 07/07/2016 0950   RDW 13.3 04/25/2014 0407   LYMPHSABS 0.3 (L) 10/25/2016 0451   LYMPHSABS 0.5 (L) 07/07/2016 0950   LYMPHSABS 0.6 (L) 04/25/2014 0407   MONOABS 1.2 (H) 10/25/2016 0451   MONOABS 0.6 04/25/2014 0407   EOSABS 0.1 10/25/2016 0451   EOSABS 0.0 04/25/2014 0407   BASOSABS 0.0 10/25/2016 0451   BASOSABS 0.0 04/25/2014 0407    BMET    Component Value Date/Time   NA 135 10/27/2016 0426   NA 141 07/07/2016 0950   NA 139 04/25/2014 0407   K 4.1 10/27/2016 0426   K 3.6 04/25/2014 0407   CL 99 (L) 10/27/2016 0426   CL 111 (H) 04/25/2014 0407   CO2 29 10/27/2016 0426   CO2 24 04/25/2014 0407   GLUCOSE 127 (H) 10/27/2016 0426   GLUCOSE 177 (H) 04/25/2014 0407   BUN 17 10/27/2016 0426   BUN 21 07/07/2016 0950   BUN 29 (H) 04/25/2014 0407   CREATININE 1.03 10/27/2016 0426   CREATININE 1.50 (H) 04/25/2014 0407   CALCIUM 9.4 10/27/2016 0426   CALCIUM 8.4 (L) 04/25/2014 0407   GFRNONAA >60 10/27/2016 0426   GFRNONAA 43 (L) 04/24/2014 0052   GFRAA >60 10/27/2016 0426   GFRAA 52 (L) 04/24/2014 0052    INR    Component Value Date/Time   INR 1.31 10/24/2016 0330     Intake/Output Summary (Last 24 hours) at 10/27/16  0907 Last data filed at 10/27/16 0500  Gross per 24 hour  Intake              120 ml  Output              215 ml  Net              -95 ml     Assessment:  66 y.o. male is s/p R carotid and subclavian embolectomy    Plan: -pt doing well-incision is healing nicely -neuro in tact -has f/u on 4.27 in office, please call with questions.  Elycia Woodside C. Donzetta Matters, MD [facility] Office: [phone] Pager: [phone]  10/27/2016 9:07 AM

## 2016-10-27 NOTE — Progress Notes (Signed)
PT Cancellation Note  Patient Details Name: Ralph Lopez MRN: [id number] DOB: Jul 09, 1951   Cancelled Treatment:    Reason Eval/Treat Not Completed: Other (comment) (Pt with speech therapy at this time.  Will f/u this pm.  )   Ralph Lopez [name] 10/27/2016, 11:33 AM [name], PTA pager [phone]

## 2016-10-27 NOTE — Care Management Note (Addendum)
Case Management Note  Patient Details  Name: Ralph Lopez MRN: [id number] Date of Birth: 07/30/1950  Subjective/Objective:                    Action/Plan: Pt discharging home with orders for [facility] services. CM provided the patient with a list of [place] agencies. They selected [facility] who is not in network with [place] and [place]. Cory with [facility] notified and accepted the referral.  Pt with orders for 3 in [place] with [facility] notified and will deliver the equipment to the room.  Patient has transportation home.   1600: Addendum: CM consulted for lovenox for home. CM spoke to patients pharmacy [facility]) and they stated he would not have any co pay. CM informed them of where to pick up the shots. He states he is comfortable with administering the medication.   Expected Discharge Date:                  Expected Discharge Plan:  [place]  In-House Referral:     Discharge planning Services  CM Consult  Post Acute Care Choice:  Home Health, Durable Medical Equipment Choice offered to:  Patient, Spouse  DME Arranged:  3-N-1 DME Agency:  [place]:  PT, OT [place] Agency:  [place]  Status of Service:  Completed, signed off  If discussed at [place] of Stay Meetings, dates discussed:    Additional Comments:  Pollie Friar, RN 10/27/2016, 3:23 PM

## 2016-10-27 NOTE — Progress Notes (Signed)
Occupational Therapy Treatment Patient Details Name: Ralph Lopez MRN: [id number] DOB: 03-26-1951 Today's Date: 10/27/2016    History of present illness Pt is a 66 y.o. male who presents to ED on 10/15/16 after being found down with R-side weakness and aphasia at home. 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 CXR 3/28showed multiple R rib fxs. On 3/29, had R CCA and SCA thromboembolectomy. CXR 3/30 showed R basilar pneumothorax and emphysema with chest tube placement. Pt remained intubated (failed extubation with resp arrest on 3//30)  Extubated 10/22/16. 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     OT comments  Pt progressing with OT and has met initial goals. See care plans for updated goals. Pt demonstrated ability to complete standing grooming tasks and functional reaching tasks with supervision at the sink this session. Additionally educated pt and wife concerning shower transfers and he was able to complete with min guard assist. Reminded pt/family to wait for approval from MD prior to showering. SpO2 98% following activity. Noted plan for D/C this afternoon. OT will continue to follow acutely.   Follow Up Recommendations  Home health OT;Supervision/Assistance - 24 hour    Equipment Recommendations  3 in 1 bedside commode;Tub/shower bench    Recommendations for Other Services      Precautions / Restrictions Precautions Precautions: Fall Precaution Comments: chest tube Restrictions Weight Bearing Restrictions: No       Mobility Bed Mobility Overal bed mobility: Modified Independent       Supine to sit: Modified independent (Device/Increase time)     General bed mobility comments: No assist needed HOB elevated and + use of rail.  I'm certain he can perform from flat level without railing.  Transfers Overall  transfer level: Modified independent Equipment used: Rolling walker (2 wheeled) Transfers: Sit to/from Stand Sit to Stand: Modified independent (Device/Increase time)         General transfer comment: No assist good technique observed.      Balance Overall balance assessment: Needs assistance Sitting-balance support: Feet supported;No upper extremity supported Sitting balance-Leahy Scale: Good     Standing balance support: No upper extremity supported;During functional activity Standing balance-Leahy Scale: Fair                             ADL either performed or assessed with clinical judgement   ADL Overall ADL's : Needs assistance/impaired     Grooming: Supervision/safety;Standing                   Toilet Transfer: Supervision/safety;Ambulation;RW       Tub/ Shower Transfer: Min guard;Ambulation;3 in 1;Rolling walker;Walk-in shower   Functional mobility during ADLs: Supervision/safety;Min guard;Rolling walker General ADL Comments: VSS throughout with SpO2 98% after activity. Pt educated on energy conservation strategies as well as need to slow down during ADL tasks.     Vision   Vision Assessment?: No apparent visual deficits   Perception     Praxis      Cognition Arousal/Alertness: Awake/alert Behavior During Therapy: WFL for tasks assessed/performed;Restless Overall Cognitive Status: Difficult to assess Area of Impairment: Safety/judgement;Problem solving;Attention                   Current Attention Level: Selective     Safety/Judgement: Decreased awareness of safety;Decreased awareness of deficits  Problem Solving: Requires verbal cues General Comments: Pt with word finding difficulties but able to converse with therapist. Rushing through tasks and requiring cues to slow down.        Exercises     Shoulder Instructions       General Comments      Pertinent Vitals/ Pain       Pain Assessment: No/denies  pain Faces Pain Scale: No hurt Pain Location: L knee Pain Descriptors / Indicators: Sore Pain Intervention(s): Monitored during session;Repositioned  Home Living                                          Prior Functioning/Environment              Frequency  Min 2X/week        Progress Toward Goals  OT Goals(current goals can now be found in the care plan section)  Progress towards OT goals: Progressing toward goals  Acute Rehab OT Goals Patient Stated Goal: get back home OT Goal Formulation: With patient Time For Goal Achievement: 11/03/16 Potential to Achieve Goals: Good ADL Goals Pt Will Perform Grooming: with supervision;sitting Pt Will Transfer to Toilet: with mod assist;bedside commode Additional ADL Goal #1: Pt will complete bed mobility mod (A) as precursor to adls.  Plan Discharge plan remains appropriate    Co-evaluation                 End of Session    OT Visit Diagnosis: Unsteadiness on feet (R26.81)   Activity Tolerance Patient tolerated treatment well   Patient Left in bed;with call bell/phone within reach;with bed alarm set   Nurse Communication Mobility status        Time: 9977-4142 OT Time Calculation (min): 14 min  Charges: OT General Charges $OT Visit: 1 Procedure OT Treatments $Self Care/Home Management : 8-22 mins  Norman Herrlich, MS OTR/L  Pager: [place] [facility] [place] 10/27/2016, 5:05 PM

## 2016-10-27 NOTE — Progress Notes (Signed)
Patient discharge instructions reviewed with patient and family. Patient and family verbalized understanding. Lovenox kit instructions reviewed with patient and family with teach back. Patient belongings with family. Patient is not in distress.

## 2016-10-27 NOTE — Progress Notes (Signed)
qPhysical Therapy Treatment Patient Details Name: Ralph Lopez MRN: [id number] DOB: 1950/08/19 Today's Date: 10/27/2016    History of Present Illness Pt is a 66 y.o. male who presents to ED on 10/15/16 after being found down with R-side weakness and aphasia at home. 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 CXR 3/28showed multiple R rib fxs. On 3/29, had R CCA and SCA thromboembolectomy. CXR 3/30 showed R basilar pneumothorax and emphysema with chest tube placement. Pt remains intubated (failed extubation with resp arrest on 3//30)  Extubated 10/22/16. 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      PT Comments    Pt progressing well.  NO LOB noted during gait.  Pt reports he has a RW at home but will require a potty chair (3:1).  Pt with SHOB but O2 sats remains above 90% on RA.  Plan for d/c home is appropriate and MD and nursing informed.    Follow Up Recommendations  Home health PT;Supervision for mobility/OOB     Equipment Recommendations  3in1 (PT)    Recommendations for Other Services       Precautions / Restrictions Precautions Precautions: Fall Precaution Comments: chest tube Restrictions Weight Bearing Restrictions: No    Mobility  Bed Mobility Overal bed mobility: Modified Independent       Supine to sit: Modified independent (Device/Increase time)     General bed mobility comments: No assist needed HOB elevated and + use of rail.  I'm certain he can perform from flat level without railing.  Transfers Overall transfer level: Modified independent Equipment used: Rolling walker (2 wheeled) Transfers: Sit to/from Stand Sit to Stand: Modified independent (Device/Increase time)         General transfer comment: No assist good technique observed.    Ambulation/Gait Ambulation/Gait assistance:  Supervision Ambulation Distance (Feet): 200 Feet Assistive device: Rolling walker (2 wheeled) Gait Pattern/deviations: Step-through pattern;Decreased stride length;Trunk flexed Gait velocity: Decreased Gait velocity interpretation: Below normal speed for age/gender General Gait Details: Cues for upper trunk control, increasing stride length and pacing.  SPO2 on RA 93%-96%.  SHOB noted DOE 2/4.     Stairs Stairs:  (denies stairs at home.  )          Wheelchair Mobility    Modified Rankin (Stroke Patients Only)       Balance Overall balance assessment: Needs assistance Sitting-balance support: Feet supported;No upper extremity supported Sitting balance-Leahy Scale: Good       Standing balance-Leahy Scale: Fair                              Cognition Arousal/Alertness: Awake/alert Behavior During Therapy: WFL for tasks assessed/performed Overall Cognitive Status: Difficult to assess (does not appear to present with cognitive deficits and able to follow commands.  )                                        Exercises      General Comments        Pertinent Vitals/Pain Pain Assessment: No/denies pain Faces Pain Scale: No hurt Pain Location: L knee Pain Descriptors / Indicators: Sore Pain Intervention(s): Monitored during session;Repositioned    Home Living  Prior Function            PT Goals (current goals can now be found in the care plan section) Acute Rehab PT Goals Patient Stated Goal: get back home Potential to Achieve Goals: Good Progress towards PT goals: Progressing toward goals    Frequency    Min 4X/week      PT Plan Current plan remains appropriate    Co-evaluation             End of Session   Activity Tolerance: Patient tolerated treatment well Patient left: in bed;with call bell/phone within reach;with family/visitor present (sitting edge of bed with OT to dovetail tx.   ) Nurse Communication: Mobility status PT Visit Diagnosis: Other abnormalities of gait and mobility (R26.89);Muscle weakness (generalized) (M62.81);Difficulty in walking, not elsewhere classified (R26.2);Other symptoms and signs involving the nervous system (R29.898)     Time: 1455-1510 PT Time Calculation (min) (ACUTE ONLY): 15 min  Charges:  $Gait Training: 8-22 mins                    G Codes:       [name], PTA pager [place] 10/27/2016, 3:15 PM

## 2016-10-28 ENCOUNTER — Other Ambulatory Visit: Payer: Self-pay | Admitting: Unknown Physician Specialty

## 2016-10-28 ENCOUNTER — Other Ambulatory Visit: Payer: Self-pay | Admitting: Family Medicine

## 2016-10-29 ENCOUNTER — Telehealth: Payer: Self-pay | Admitting: Unknown Physician Specialty

## 2016-10-29 ENCOUNTER — Telehealth: Payer: Self-pay | Admitting: *Deleted

## 2016-10-29 NOTE — Telephone Encounter (Signed)
Patient notified of MRI cancellation, left message on patient VM.  Ralph Lopez stated she will cancel appt

## 2016-10-29 NOTE — Telephone Encounter (Signed)
Routing to provider  

## 2016-10-29 NOTE — Telephone Encounter (Signed)
Per Dr Rogue Bussing, hold MRI for now and let Dr Grayland Ormond decide when he returns if he wants another MRI

## 2016-10-29 NOTE — Telephone Encounter (Signed)
I spoke with wife and she statse she got my message about the MRI being cancelled for Friday

## 2016-10-29 NOTE — Telephone Encounter (Signed)
Skeet Simmer would like to have verbal orders for PT orders 1 week once, 2 weeks twice and last week twice. He would also like to have a verbal for an OT eval and speech eval.

## 2016-10-29 NOTE — Telephone Encounter (Signed)
OK. Thanks.

## 2016-10-29 NOTE — Telephone Encounter (Signed)
Have pt keep f/u with me and will discuss then.

## 2016-10-29 NOTE — Telephone Encounter (Signed)
Is scheduled for an MRI with and with out contrast Friday  Reason for Exam (SYMPTOM OR DIAGNOSIS REQUIRED) Restaging lung cancer, patient with known 2 mm focus on previous MRI.     (this was ordered 09/06/16) He was in hospital at [facility] and had MRI without contrast on 10/16/16. Asking if he needs this MRI for Friday.   CLINICAL DATA:  66 y/o M; left MCA distribution infarct post revascularization.  IMPRESSION: Left MCA distribution diffusion restriction is present in the left insula, left anterior temporal lobe, and left lateral frontal lobe in distribution similar to CT perfusion CBF less than 30%. There is a small additional area of diffusion restriction within the left parietal lobe not indicated on the perfusion study. Findings are compatible with acute infarction. No associated hemorrhage identified.   Electronically Signed   By: Kristine Garbe M.D.   On: 10/16/2016 04:49  Please advise.

## 2016-10-29 NOTE — Telephone Encounter (Signed)
Ralph Lopez's Patient.

## 2016-10-30 ENCOUNTER — Encounter: Payer: Self-pay | Admitting: Emergency Medicine

## 2016-10-30 ENCOUNTER — Other Ambulatory Visit: Payer: Self-pay | Admitting: *Deleted

## 2016-10-30 ENCOUNTER — Emergency Department: Payer: BLUE CROSS/BLUE SHIELD

## 2016-10-30 ENCOUNTER — Telehealth: Payer: Self-pay | Admitting: *Deleted

## 2016-10-30 ENCOUNTER — Inpatient Hospital Stay
Admission: EM | Admit: 2016-10-30 | Discharge: 2016-11-02 | DRG: 871 | Disposition: A | Discharge status: Home / Self Care | Payer: BLUE CROSS/BLUE SHIELD | Attending: Internal Medicine | Admitting: Internal Medicine

## 2016-10-30 DIAGNOSIS — I129 Hypertensive chronic kidney disease with stage 1 through stage 4 chronic kidney disease, or unspecified chronic kidney disease: Secondary | ICD-10-CM | POA: Diagnosis present

## 2016-10-30 DIAGNOSIS — Z801 Family history of malignant neoplasm of trachea, bronchus and lung: Secondary | ICD-10-CM

## 2016-10-30 DIAGNOSIS — Z7983 Long term (current) use of bisphosphonates: Secondary | ICD-10-CM | POA: Diagnosis not present

## 2016-10-30 DIAGNOSIS — Z87891 Personal history of nicotine dependence: Secondary | ICD-10-CM

## 2016-10-30 DIAGNOSIS — Z833 Family history of diabetes mellitus: Secondary | ICD-10-CM

## 2016-10-30 DIAGNOSIS — Z794 Long term (current) use of insulin: Secondary | ICD-10-CM

## 2016-10-30 DIAGNOSIS — E1122 Type 2 diabetes mellitus with diabetic chronic kidney disease: Secondary | ICD-10-CM | POA: Diagnosis present

## 2016-10-30 DIAGNOSIS — C3411 Malignant neoplasm of upper lobe, right bronchus or lung: Secondary | ICD-10-CM | POA: Diagnosis not present

## 2016-10-30 DIAGNOSIS — Y838 Other surgical procedures as the cause of abnormal reaction of the patient, or of later complication, without mention of misadventure at the time of the procedure: Secondary | ICD-10-CM | POA: Diagnosis present

## 2016-10-30 DIAGNOSIS — A419 Sepsis, unspecified organism: Principal | ICD-10-CM

## 2016-10-30 DIAGNOSIS — C7951 Secondary malignant neoplasm of bone: Secondary | ICD-10-CM | POA: Diagnosis present

## 2016-10-30 DIAGNOSIS — J440 Chronic obstructive pulmonary disease with acute lower respiratory infection: Secondary | ICD-10-CM | POA: Diagnosis present

## 2016-10-30 DIAGNOSIS — J189 Pneumonia, unspecified organism: Secondary | ICD-10-CM | POA: Diagnosis present

## 2016-10-30 DIAGNOSIS — Z86718 Personal history of other venous thrombosis and embolism: Secondary | ICD-10-CM

## 2016-10-30 DIAGNOSIS — J9601 Acute respiratory failure with hypoxia: Secondary | ICD-10-CM | POA: Diagnosis not present

## 2016-10-30 DIAGNOSIS — E1165 Type 2 diabetes mellitus with hyperglycemia: Secondary | ICD-10-CM | POA: Diagnosis present

## 2016-10-30 DIAGNOSIS — I480 Paroxysmal atrial fibrillation: Secondary | ICD-10-CM | POA: Diagnosis present

## 2016-10-30 DIAGNOSIS — N189 Chronic kidney disease, unspecified: Secondary | ICD-10-CM | POA: Diagnosis not present

## 2016-10-30 DIAGNOSIS — Z8673 Personal history of transient ischemic attack (TIA), and cerebral infarction without residual deficits: Secondary | ICD-10-CM

## 2016-10-30 DIAGNOSIS — R0602 Shortness of breath: Secondary | ICD-10-CM | POA: Diagnosis not present

## 2016-10-30 DIAGNOSIS — I4891 Unspecified atrial fibrillation: Secondary | ICD-10-CM

## 2016-10-30 DIAGNOSIS — K219 Gastro-esophageal reflux disease without esophagitis: Secondary | ICD-10-CM | POA: Diagnosis present

## 2016-10-30 DIAGNOSIS — N400 Enlarged prostate without lower urinary tract symptoms: Secondary | ICD-10-CM | POA: Diagnosis present

## 2016-10-30 DIAGNOSIS — L7632 Postprocedural hematoma of skin and subcutaneous tissue following other procedure: Secondary | ICD-10-CM | POA: Diagnosis present

## 2016-10-30 DIAGNOSIS — E872 Acidosis: Secondary | ICD-10-CM | POA: Diagnosis present

## 2016-10-30 DIAGNOSIS — Z8249 Family history of ischemic heart disease and other diseases of the circulatory system: Secondary | ICD-10-CM | POA: Diagnosis not present

## 2016-10-30 DIAGNOSIS — F329 Major depressive disorder, single episode, unspecified: Secondary | ICD-10-CM | POA: Diagnosis present

## 2016-10-30 DIAGNOSIS — R221 Localized swelling, mass and lump, neck: Secondary | ICD-10-CM

## 2016-10-30 DIAGNOSIS — N183 Chronic kidney disease, stage 3 (moderate): Secondary | ICD-10-CM | POA: Diagnosis present

## 2016-10-30 DIAGNOSIS — C3490 Malignant neoplasm of unspecified part of unspecified bronchus or lung: Secondary | ICD-10-CM | POA: Diagnosis present

## 2016-10-30 DIAGNOSIS — F419 Anxiety disorder, unspecified: Secondary | ICD-10-CM | POA: Diagnosis present

## 2016-10-30 DIAGNOSIS — Z9221 Personal history of antineoplastic chemotherapy: Secondary | ICD-10-CM

## 2016-10-30 DIAGNOSIS — E785 Hyperlipidemia, unspecified: Secondary | ICD-10-CM | POA: Diagnosis present

## 2016-10-30 DIAGNOSIS — Z86711 Personal history of pulmonary embolism: Secondary | ICD-10-CM

## 2016-10-30 DIAGNOSIS — Z79899 Other long term (current) drug therapy: Secondary | ICD-10-CM

## 2016-10-30 LAB — CBC WITH DIFFERENTIAL/PLATELET
BASOS ABS: 0.2 10*3/uL — AB (ref 0–0.1)
Basophils Relative: 1 %
EOS ABS: 0.1 10*3/uL (ref 0–0.7)
Eosinophils Relative: 1 %
HCT: 34.6 % — ABNORMAL LOW (ref 40.0–52.0)
Hemoglobin: 11.4 g/dL — ABNORMAL LOW (ref 13.0–18.0)
LYMPHS PCT: 3 %
Lymphs Abs: 0.6 10*3/uL — ABNORMAL LOW (ref 1.0–3.6)
MCH: 29.4 pg (ref 26.0–34.0)
MCHC: 32.9 g/dL (ref 32.0–36.0)
MCV: 89.4 fL (ref 80.0–100.0)
MONO ABS: 0.5 10*3/uL (ref 0.2–1.0)
Monocytes Relative: 2 %
Neutro Abs: 18.2 10*3/uL — ABNORMAL HIGH (ref 1.4–6.5)
Neutrophils Relative %: 93 %
Platelets: 229 10*3/uL (ref 150–440)
RBC: 3.87 MIL/uL — ABNORMAL LOW (ref 4.40–5.90)
RDW: 15.2 % — AB (ref 11.5–14.5)
WBC: 19.6 10*3/uL — ABNORMAL HIGH (ref 3.8–10.6)

## 2016-10-30 LAB — BLOOD GAS, ARTERIAL
Acid-Base Excess: 0.6 mmol/L (ref 0.0–2.0)
Bicarbonate: 26.6 mmol/L (ref 20.0–28.0)
DELIVERY SYSTEMS: POSITIVE
EXPIRATORY PAP: 6
FIO2: 100
Inspiratory PAP: 12
O2 Saturation: 99.7 %
PH ART: 7.36 (ref 7.350–7.450)
Patient temperature: 37
pCO2 arterial: 47 mmHg (ref 32.0–48.0)
pO2, Arterial: 197 mmHg — ABNORMAL HIGH (ref 83.0–108.0)

## 2016-10-30 LAB — BASIC METABOLIC PANEL
ANION GAP: 8 (ref 5–15)
BUN: 18 mg/dL (ref 6–20)
CO2: 27 mmol/L (ref 22–32)
Calcium: 9.4 mg/dL (ref 8.9–10.3)
Chloride: 98 mmol/L — ABNORMAL LOW (ref 101–111)
Creatinine, Ser: 1.2 mg/dL (ref 0.61–1.24)
GFR calc Af Amer: >60 mL/min (ref >60)
GLUCOSE: 227 mg/dL — AB (ref 65–99)
Potassium: 4.8 mmol/L (ref 3.5–5.1)
Sodium: 133 mmol/L — ABNORMAL LOW (ref 135–145)

## 2016-10-30 LAB — TROPONIN I: Troponin I: <0.03 ng/mL (ref <0.03)

## 2016-10-30 LAB — GLUCOSE, CAPILLARY: Glucose-Capillary: 183 mg/dL — ABNORMAL HIGH (ref 65–99)

## 2016-10-30 MED ORDER — ATORVASTATIN CALCIUM 20 MG PO TABS
10.0000 mg | ORAL_TABLET | Freq: Every day | ORAL | Status: DC
  Administered 2016-10-31 – 2016-11-02 (×3): 10 mg via ORAL
  Filled 2016-10-30 (×3): qty 1

## 2016-10-30 MED ORDER — ALBUTEROL SULFATE (2.5 MG/3ML) 0.083% IN NEBU: 2.5000 mg | INHALATION_SOLUTION | Freq: Four times a day (QID) | RESPIRATORY_TRACT | Status: DC | PRN

## 2016-10-30 MED ORDER — DEXTROSE 5 % IV SOLN
5.0000 mg/h | Freq: Once | INTRAVENOUS | Status: AC
  Administered 2016-10-30: 5 mg/h via INTRAVENOUS
  Filled 2016-10-30: qty 100

## 2016-10-30 MED ORDER — ONDANSETRON HCL 4 MG PO TABS: 8.0000 mg | ORAL_TABLET | Freq: Two times a day (BID) | ORAL | Status: DC | PRN

## 2016-10-30 MED ORDER — SODIUM CHLORIDE 0.9% FLUSH
3.0000 mL | Freq: Two times a day (BID) | INTRAVENOUS | Status: DC
  Administered 2016-10-31 – 2016-11-02 (×4): 3 mL via INTRAVENOUS

## 2016-10-30 MED ORDER — ACETAMINOPHEN 325 MG PO TABS: 650.0000 mg | ORAL_TABLET | Freq: Four times a day (QID) | ORAL | Status: DC | PRN

## 2016-10-30 MED ORDER — SODIUM CHLORIDE 0.9 % IV BOLUS (SEPSIS)
1000.0000 mL | Freq: Once | INTRAVENOUS | Status: AC
  Administered 2016-10-30: 1000 mL via INTRAVENOUS

## 2016-10-30 MED ORDER — IPRATROPIUM-ALBUTEROL 0.5-2.5 (3) MG/3ML IN SOLN
3.0000 mL | Freq: Once | RESPIRATORY_TRACT | Status: AC
  Administered 2016-10-30: 3 mL via RESPIRATORY_TRACT
  Filled 2016-10-30: qty 3

## 2016-10-30 MED ORDER — SODIUM CHLORIDE 0.9 % IV SOLN
1.0000 g | Freq: Once | INTRAVENOUS | Status: AC
  Administered 2016-10-30: 1 g via INTRAVENOUS
  Filled 2016-10-30: qty 10

## 2016-10-30 MED ORDER — FAMOTIDINE 20 MG PO TABS: 20.0000 mg | ORAL_TABLET | Freq: Every day | ORAL | Status: DC | PRN

## 2016-10-30 MED ORDER — METHYLPREDNISOLONE SODIUM SUCC 125 MG IJ SOLR
60.0000 mg | Freq: Four times a day (QID) | INTRAMUSCULAR | Status: DC
  Administered 2016-10-31 – 2016-11-02 (×10): 60 mg via INTRAVENOUS
  Filled 2016-10-30 (×10): qty 2

## 2016-10-30 MED ORDER — VANCOMYCIN HCL IN DEXTROSE 1-5 GM/200ML-% IV SOLN
1000.0000 mg | Freq: Once | INTRAVENOUS | Status: AC
  Administered 2016-10-30: 1000 mg via INTRAVENOUS
  Filled 2016-10-30: qty 200

## 2016-10-30 MED ORDER — VANCOMYCIN HCL IN DEXTROSE 1-5 GM/200ML-% IV SOLN: 1000.0000 mg | Freq: Once | INTRAVENOUS | Status: DC

## 2016-10-30 MED ORDER — ONDANSETRON HCL 4 MG/2ML IJ SOLN: 4.0000 mg | Freq: Four times a day (QID) | INTRAMUSCULAR | Status: DC | PRN

## 2016-10-30 MED ORDER — INSULIN GLARGINE 100 UNIT/ML ~~LOC~~ SOLN
30.0000 [IU] | Freq: Every day | SUBCUTANEOUS | Status: DC
  Administered 2016-10-31 – 2016-11-02 (×3): 30 [IU] via SUBCUTANEOUS
  Filled 2016-10-30 (×4): qty 0.3

## 2016-10-30 MED ORDER — DEXTROSE 5 % IV SOLN
2.0000 g | Freq: Once | INTRAVENOUS | Status: AC
  Administered 2016-10-30: 2 g via INTRAVENOUS
  Filled 2016-10-30: qty 2

## 2016-10-30 MED ORDER — OXYCODONE HCL 5 MG PO TABS
5.0000 mg | ORAL_TABLET | ORAL | Status: DC | PRN
  Administered 2016-11-02: 07:00:00 5 mg via ORAL

## 2016-10-30 MED ORDER — VANCOMYCIN HCL IN DEXTROSE 750-5 MG/150ML-% IV SOLN
750.0000 mg | Freq: Two times a day (BID) | INTRAVENOUS | Status: DC
  Administered 2016-10-31: 750 mg via INTRAVENOUS
  Filled 2016-10-30 (×2): qty 150

## 2016-10-30 MED ORDER — TAMSULOSIN HCL 0.4 MG PO CAPS
0.4000 mg | ORAL_CAPSULE | Freq: Every day | ORAL | Status: DC
  Administered 2016-10-31 – 2016-11-02 (×3): 0.4 mg via ORAL
  Filled 2016-10-30 (×3): qty 1

## 2016-10-30 MED ORDER — ORAL CARE MOUTH RINSE
15.0000 mL | Freq: Two times a day (BID) | OROMUCOSAL | Status: DC
  Administered 2016-11-01 – 2016-11-02 (×5): 15 mL via OROMUCOSAL

## 2016-10-30 MED ORDER — OXYCODONE HCL ER 10 MG PO T12A
10.0000 mg | EXTENDED_RELEASE_TABLET | Freq: Two times a day (BID) | ORAL | Status: DC
  Administered 2016-10-31 – 2016-11-02 (×5): 10 mg via ORAL
  Filled 2016-10-30 (×5): qty 1

## 2016-10-30 MED ORDER — CHLORHEXIDINE GLUCONATE 0.12 % MT SOLN
15.0000 mL | Freq: Two times a day (BID) | OROMUCOSAL | Status: DC
  Administered 2016-10-31 – 2016-11-02 (×4): 15 mL via OROMUCOSAL
  Filled 2016-10-30 (×3): qty 15

## 2016-10-30 MED ORDER — SENNOSIDES-DOCUSATE SODIUM 8.6-50 MG PO TABS: 1.0000 | ORAL_TABLET | Freq: Every evening | ORAL | Status: DC | PRN

## 2016-10-30 MED ORDER — FENTANYL CITRATE (PF) 100 MCG/2ML IJ SOLN: 25.0000 ug | INTRAMUSCULAR | Status: DC | PRN

## 2016-10-30 MED ORDER — FENTANYL CITRATE (PF) 100 MCG/2ML IJ SOLN
50.0000 ug | INTRAMUSCULAR | Status: DC | PRN
  Administered 2016-10-30 (×2): 50 ug via INTRAVENOUS
  Filled 2016-10-30 (×2): qty 2

## 2016-10-30 MED ORDER — OXYCODONE HCL 5 MG PO TABS
5.0000 mg | ORAL_TABLET | ORAL | Status: DC | PRN
  Administered 2016-11-01: 17:00:00 5 mg via ORAL
  Administered 2016-11-01 – 2016-11-02 (×2): 10 mg via ORAL
  Filled 2016-10-30: qty 2
  Filled 2016-10-30: qty 1
  Filled 2016-10-30 (×2): qty 2

## 2016-10-30 MED ORDER — MAGNESIUM CITRATE PO SOLN
1.0000 | Freq: Once | ORAL | Status: DC | PRN
  Filled 2016-10-30: qty 296

## 2016-10-30 MED ORDER — OXYCODONE HCL ER 10 MG PO T12A: 10.0000 mg | EXTENDED_RELEASE_TABLET | Freq: Two times a day (BID) | ORAL | 0 refills | Status: AC

## 2016-10-30 MED ORDER — ACETAMINOPHEN 650 MG RE SUPP: 650.0000 mg | Freq: Four times a day (QID) | RECTAL | Status: DC | PRN

## 2016-10-30 MED ORDER — DILTIAZEM HCL 25 MG/5ML IV SOLN
15.0000 mg | Freq: Once | INTRAVENOUS | Status: AC
  Administered 2016-10-30: 15 mg via INTRAVENOUS
  Filled 2016-10-30: qty 5

## 2016-10-30 MED ORDER — ONDANSETRON HCL 4 MG PO TABS: 4.0000 mg | ORAL_TABLET | Freq: Four times a day (QID) | ORAL | Status: DC | PRN

## 2016-10-30 MED ORDER — DEXTROSE 5 % IV SOLN: 2.0000 g | Freq: Once | INTRAVENOUS | Status: DC

## 2016-10-30 MED ORDER — BISACODYL 5 MG PO TBEC: 5.0000 mg | DELAYED_RELEASE_TABLET | Freq: Every day | ORAL | Status: DC | PRN

## 2016-10-30 MED ORDER — DEXTROSE 5 % IV SOLN
2.0000 g | Freq: Two times a day (BID) | INTRAVENOUS | Status: DC
  Administered 2016-10-31 – 2016-11-01 (×3): 2 g via INTRAVENOUS
  Filled 2016-10-30 (×4): qty 2

## 2016-10-30 MED ORDER — PANTOPRAZOLE SODIUM 40 MG PO TBEC
40.0000 mg | DELAYED_RELEASE_TABLET | Freq: Every day | ORAL | Status: DC
Start: 2016-10-31 — End: 2016-11-02
  Administered 2016-10-31 – 2016-11-02 (×3): 40 mg via ORAL
  Filled 2016-10-30 (×3): qty 1

## 2016-10-30 MED ORDER — ENOXAPARIN SODIUM 80 MG/0.8ML ~~LOC~~ SOLN
1.0000 mg/kg | Freq: Two times a day (BID) | SUBCUTANEOUS | Status: DC
  Administered 2016-10-31 – 2016-11-02 (×6): 70 mg via SUBCUTANEOUS
  Filled 2016-10-30 (×6): qty 0.8

## 2016-10-30 MED ORDER — VANCOMYCIN HCL 500 MG IV SOLR
500.0000 mg | Freq: Once | INTRAVENOUS | Status: AC
  Administered 2016-10-30: 500 mg via INTRAVENOUS
  Filled 2016-10-30: qty 500

## 2016-10-30 MED ORDER — SODIUM CHLORIDE 0.9 % IV SOLN
INTRAVENOUS | Status: DC
  Administered 2016-10-31: 01:00:00 via INTRAVENOUS
  Administered 2016-10-31: 75 mL/h via INTRAVENOUS
  Administered 2016-10-31 – 2016-11-02 (×3): via INTRAVENOUS

## 2016-10-30 MED ORDER — IOPAMIDOL (ISOVUE-370) INJECTION 76%
75.0000 mL | Freq: Once | INTRAVENOUS | Status: AC | PRN
  Administered 2016-10-30: 75 mL via INTRAVENOUS

## 2016-10-30 MED ORDER — IPRATROPIUM BROMIDE 0.02 % IN SOLN: 0.5000 mg | Freq: Four times a day (QID) | RESPIRATORY_TRACT | Status: DC | PRN

## 2016-10-30 NOTE — Consult Note (Signed)
Name: Ralph Lopez MRN: [id number] DOB: 07-07-1951    ADMISSION DATE:  10/30/2016 CONSULTATION DATE:  10/30/16  REFERRING MD :  Dr. Pollyann Samples  CHIEF COMPLAINT:  Shortness of breath  BRIEF PATIENT DESCRIPTION:  66 year old with stage IV Lung Ca,Left MCA Stroke,presenting with Acute shortness of breath.  On BiPAP with Afib RVR  SIGNIFICANT EVENTS  4/13 Patient was admitted to ICU with afib with RVR and acute shortness of breath.  STUDIES:  4/12 CTPE>> . Moderate respiratory motion artifact. No pulmonary embolus identified. Interval development of consolidations within the left lung basecompatible with pneumonia.. Interval slight increase in size of right suprahilar mass and metastasis in the lungs bilaterally.. The right suprahilar mass invades the right superior pulmonaryvein extending into the left atrium.5. Trace residual right apical pneumothorax and small diminishedvolume of air in the anterior chest wall.. Stable mildly displaced right lateral sixth and seventh rib fractures. 10/17/16 ECHO>>Left ventricle: The cavity size was normal. Systolic function was  vigorous. 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 A prolonged stay in the ICU with complications including chest tube.  Patient was discharged home and was apparently doing better.  On 1/12 patient had an episode of vomiting and 6 hours later he started having shortness of breath.  Patient was also noted to be in afib with RVR.  Patient was placed on cardizem drip and on BiPAP. PCCM team was consulted for further management.  PAST MEDICAL HISTORY :   has a past medical history of Anxiety;  Cancer ([place]); Chronic kidney disease; COPD (chronic obstructive pulmonary disease) ([place]); Depression; Diabetes mellitus without complication ([place]); Hyperlipidemia; and Hypertension.  has a past surgical history that includes Mandible surgery; Eye surgery; Flexible bronchoscopy (N/A, 04/28/2016); ir generic historical (10/15/2016); ir generic historical (10/15/2016); ir generic historical (10/15/2016); Radiology with anesthesia (N/A, 10/15/2016); and Carotid-subclavian Bypass Graft (Right, 10/16/2016). Prior to Admission medications   Medication Sig Start Date End Date Taking? Authorizing Provider  albuterol (PROVENTIL HFA;VENTOLIN HFA) 108 (90 Base) MCG/ACT inhaler Inhale 2 puffs into the lungs every 6 (six) hours as needed for wheezing or shortness of breath.   Yes Historical Provider, MD  atorvastatin (LIPITOR) 10 MG tablet Take 10 mg by mouth daily.   Yes Historical Provider, MD  BYDUREON 2 MG PEN INJECT '2MG'$  SUBCUTANEOUSLY WEEKLY 10/29/16  Yes Kathrine Haddock, NP  enoxaparin (LOVENOX) 80 MG/0.8ML injection Inject 0.7 mLs (70 mg total) into the skin every 12 (twelve) hours. 10/27/16  Yes Donzetta Starch, NP  feeding supplement, ENSURE ENLIVE, (ENSURE ENLIVE) LIQD Take 237 mLs by mouth 2 (two) times daily between meals. 10/28/16  Yes Donzetta Starch, NP  Insulin Glargine (TOUJEO SOLOSTAR) 300 UNIT/ML SOPN Inject 30 Units as directed daily. 06/02/16  Yes Kathrine Haddock, NP  ipratropium-albuterol (DUONEB) 0.5-2.5 (3) MG/3ML SOLN Take 3 mLs by nebulization 3 (three) times daily. 10/27/16  Yes Donzetta Starch, NP  LORazepam (ATIVAN) 0.5 MG tablet TAKE 1 TABLET BY MOUTH TWICE DAILY 10/28/16  Yes Kathrine Haddock, NP  metoprolol tartrate (LOPRESSOR) 25 MG tablet Take 1 tablet (25 mg total) by mouth 3 (three) times daily. 10/27/16  Yes Donzetta Starch, NP  ondansetron (ZOFRAN) 8 MG tablet Take 1 tablet by mouth 2 (two) times daily as needed. 05/09/16  Yes Historical Provider, MD  oxyCODONE (OXYCONTIN) 10 mg 12 hr tablet Take 1 tablet  (10 mg total) by mouth every 12 (twelve) hours. 10/30/16  Yes Sindy Guadeloupe, MD  Oxycodone HCl 10 MG TABS Take 0.5-1 tablets (5-10 mg total) by mouth every 4 (four) hours as needed. 10/27/16  Yes Donzetta Starch, NP  pantoprazole (PROTONIX) 40 MG tablet Take 1 tablet (40 mg total) by mouth daily. 05/05/16  Yes Kathrine Haddock, NP  predniSONE (DELTASONE) 10 MG tablet Take 4 tablets (40 mg total) by mouth daily with breakfast. Taper dose: '40mg'$  x 2 days, 30 mg x 2 days, '20mg'$  x 2 days, 10 mg x 2 days, 5 mg (1/2 tab) x 2 days then stop 10/28/16  Yes Donzetta Starch, NP  ranitidine (ZANTAC) 150 MG tablet Take 150 mg by mouth daily as needed for heartburn.    Yes Historical Provider, MD  tamsulosin (FLOMAX) 0.4 MG CAPS capsule Take 1 capsule (0.4 mg total) by mouth daily. 10/28/16  Yes Donzetta Starch, NP   No Known Allergies  FAMILY HISTORY:  family history includes Breast cancer in his mother and sister; Colon cancer in his brother; Diabetes in his mother; Heart disease in his father; Hypertension in his mother; Lung cancer in his brother; Thrombosis in his mother. SOCIAL HISTORY:  reports that he quit smoking about 2 years ago. He quit after 20.00 years of use. He has never used smokeless tobacco. He reports that he does not drink alcohol or use drugs.  REVIEW OF SYSTEMS:   Constitutional: Negative for fever, chills, weight loss, malaise/fatigue and diaphoresis.  HENT: Negative for hearing loss, ear pain, nosebleeds, congestion, sore throat, neck pain, tinnitus and ear discharge.   Eyes: Negative for blurred vision, double vision, photophobia, pain, discharge and redness.  Respiratory: Negative for cough, hemoptysis, sputum production, shortness of breath, wheezing and stridor.   Cardiovascular: Negative for chest pain, palpitations, orthopnea, claudication, leg swelling and PND.  Gastrointestinal: Negative for heartburn, nausea, vomiting, abdominal pain, diarrhea, constipation, blood in stool and melena.    Genitourinary: Negative for dysuria, urgency, frequency, hematuria and flank pain.  Musculoskeletal: Negative for myalgias, back pain, joint pain and falls.  Skin: Negative for itching and rash.  Neurological: Negative for dizziness, tingling, tremors, sensory change, speech change, focal weakness, seizures, loss of consciousness, weakness and headaches.  Endo/Heme/Allergies: Negative for environmental allergies and polydipsia. Does not bruise/bleed easily.  SUBJECTIVE: "Patient states that he is feeling better and is not short of breath anymore"  VITAL SIGNS: Temp:  [98.4 F (36.9 C)-98.8 F (37.1 C)] 98.8 F (37.1 C) (04/12 2355) Pulse Rate:  [105-166] 115 (04/12 2355) Resp:  [20-30] 26 (04/12 2355) BP: (80-179)/(58-131) 146/81 (04/12 2355) SpO2:  [90 %-100 %] 100 % (04/12 2355) FiO2 (%):  [65 %-100 %] 65 % (04/12 2355) Weight:  [68.5 kg (151 lb)-71.7 kg (158 lb 1.1 oz)] 71.7 kg (158 lb 1.1 oz) (04/12 2355)  PHYSICAL EXAMINATION: General: Chronically Ill appearing male, on BiPAP, in no acute distress at this time Neuro:  Awake, Alert, Oriented , no focal deficits HEENT:  AT,[place],No JVD Cardiovascular:  Tachycardia, S1S2,no m/r/g noted Lungs: diminished bibasilar, no wheezes, crackles, rhonchi noted Abdomen: Soft, non tender, active Bowel sounds Musculoskeletal:  No inflammation/deformity noted Skin: grossly intact   Recent Labs Lab 10/25/16 0451 10/27/16 0426 10/30/16 1835  NA 134* 135 133*  K 3.5 4.1 4.8  CL 96* 99* 98*  CO2 '27 29 27  '$ BUN 22* 17 18  CREATININE 1.01 1.03 1.20  GLUCOSE 107* 127* 227*    Recent Labs Lab 10/26/16 0317 10/27/16 0426 10/30/16 1835  HGB 9.4* 9.6* 11.4*  HCT 29.1* 29.7* 34.6*  WBC 9.1 7.5 19.6*  PLT 246 216 229   Ct Angio Chest Pe W And/or Wo Contrast  Result Date: 10/30/2016 CLINICAL DATA:  66 y/o M; acute chest pain with concern for pulmonary embolus. History of pulmonary malignancy. EXAM: CT ANGIOGRAPHY CHEST WITH CONTRAST  TECHNIQUE: Multidetector CT imaging of the chest was performed using the standard protocol during bolus administration of intravenous contrast. Multiplanar CT image reconstructions and MIPs were obtained to evaluate the vascular anatomy. CONTRAST:  75 cc Isovue 370 COMPARISON:  10/17/2016 CT chest FINDINGS: Cardiovascular: There is a right suprahilar mass measuring up to 6.9 cm in the right suprahilar region (series 4, image 39), previously 65 mm, invading the right superior pulmonary vein and extending into the right aspect of the left atrium (series 4, image 52). Motion artifact in the lungs. No pulmonary embolus identified. There is narrowing of the right superior and middle lobe pulmonary artery is due to increasing invasion from right suprahilar are pulmonary mass. Normal caliber thoracic aorta with mild calcific atherosclerosis. Normal heart size. No pericardial effusion. Mild coronary artery calcification. Mediastinum/Nodes: Left hilar lymphadenopathy is increased in size from the prior study. Normal trachea, esophagus, and thyroid gland. Lungs/Pleura: Numerous metastasis are present within the lungs bilaterally which are increased in size from the prior CT for example in the left upper lobe there is a nodule measuring 29 x 27 mm, previously 26 x 25 mm (series 6, image 69). Trace residual right pneumothorax. There consolidations within the left upper lobe lingula and left lower lobe likely representing interval development of pneumonia. Upper Abdomen: No acute abnormality. Musculoskeletal: Air within the anterior chest wall is markedly decreased in comparison with the prior CT of the chest with small residual. Soft tissue lung nodule of the right anterior inferior chest wall subcutaneous fat measuring 16 mm is stable (series 4, image 71). Right lateral sixth and seventh mildly displaced acute rib fractures as seen on prior CT of the chest. Review of the MIP images confirms the above findings. IMPRESSION: 1.  Moderate respiratory motion artifact. No pulmonary embolus identified. 2. Interval development of consolidations within the left lung base compatible with pneumonia. 3. Interval slight increase in size of right suprahilar mass and metastasis in the lungs bilaterally. 4. The right suprahilar mass invades the right superior pulmonary vein extending into the left atrium. 5. Trace residual right apical pneumothorax and small diminished volume of air in the anterior chest wall. 6. Stable mildly displaced right lateral sixth and seventh rib fractures. Electronically Signed   By: Kristine Garbe M.D.   On: 10/30/2016 22:07   Dg Chest Portable 1 View  Result Date: 10/30/2016 CLINICAL DATA:  Shortness of Breath EXAM: PORTABLE CHEST 1 VIEW COMPARISON:  10/27/2016.  CT scan 10/17/2016 FINDINGS: Right hilar mass again noted. Bilateral pulmonary metastases are similar prior. Asymmetric elevation right hemidiaphragm unchanged. Cardiopericardial silhouette is at upper limits of normal for size. Nonacute right rib fractures evident. Telemetry leads overlie the chest. IMPRESSION: Stable exam. Right hilar mass with bilateral pulmonary metastases. No new or acute interval findings. Electronically Signed   By: Misty Stanley M.D.   On: 10/30/2016 18:45    ASSESSMENT / PLAN: Acute Respiratory Failure  Afib with RVR Stage IV lung Cancer with mets to right hip bone,Status post chemotherapy. Plan for radiation therapy to pelvic mass ?Left lobe Pneumona  Continue BiPAP, Wean as tolerated Agree with Antibiotics Follow cultures Will taper steroids Cardizem gtt,taper Follow Lactic acid/ procalcitonin Resume home dose metoprolol Bronchodilators   Bincy Varughese,AG-ACNP Pulmonary and [place]  10/31/2016, 12:11 AM

## 2016-10-30 NOTE — ED Provider Notes (Signed)
[facility] Emergency Department Provider Note    None    (approximate)  I have reviewed the triage vital signs and the nursing notes.   HISTORY  Chief Complaint Shortness of Breath  Level V Caveat:  Acute respiratory distress  HPI Ralph Lopez is a 67 y.o. male history of lung cancer as well as COPD and anxiety with recent admission to the hospital for a reported stroke and status post chest tube though placement of chest tube is unclear presents with acute shortness of breath that occurred shortly prior to arrival. Patient and tachypnea can hypoxic with use of accessory muscles.States that this did build up over the past several hours. He denies any history of COPD though I do see that documented in his chart.    ECHO 3/30 Study Conclusions  - Left ventricle: The cavity size was normal. Systolic function was   vigorous. The estimated ejection fraction was in the range of 65%   to 70%. Wall motion was normal; there were no regional wall   motion abnormalities. Doppler parameters are consistent with   abnormal left ventricular relaxation (grade 1 diastolic   dysfunction). - Aortic valve: Trileaflet; normal thickness leaflets. There was no   regurgitation. - Aortic root: The aortic root was normal in size. - Mitral valve: Structurally normal valve. - Left atrium: The atrium was normal in size. - Right ventricle: Systolic function was normal. - Tricuspid valve: There was no regurgitation. - Pulmonary arteries: Systolic pressure could not be accurately   estimated. - Inferior vena cava: The vessel was normal in size.  Impressions:  - Impaired relaxation with normal filling pressures, otherwise   normal study.  Past Medical History:  Diagnosis Date  . Anxiety   . Cancer ([place])    Stage IV Lung  . Chronic kidney disease   . COPD (chronic obstructive pulmonary disease) ([place])   . Depression   . Diabetes mellitus without complication ([place])     . Hyperlipidemia   . Hypertension    Family History  Problem Relation Age of Onset  . Diabetes Mother   . Hypertension Mother   . Breast cancer Mother   . Thrombosis Mother   . Heart disease Father     MI  . Breast cancer Sister   . Colon cancer Brother   . Lung cancer Brother    Past Surgical History:  Procedure Laterality Date  . CAROTID-SUBCLAVIAN BYPASS GRAFT Right 10/16/2016   Procedure: RIGHT CAROTID-SUBCLAVIAN THROMBECTOMY;  Surgeon: Waynetta Sandy, MD;  Location: [place];  Service: Vascular;  Laterality: Right;  . EYE SURGERY    . FLEXIBLE BRONCHOSCOPY N/A 04/28/2016   Procedure: FLEXIBLE BRONCHOSCOPY;  Surgeon: [name], MD;  Location: [facility];  Service: Pulmonary;  Laterality: N/A;  . IR GENERIC HISTORICAL  10/15/2016   IR PERCUTANEOUS ART THROMBECTOMY/INFUSION INTRACRANIAL INC DIAG ANGIO 10/15/2016 Luanne Bras, MD [facility]  . IR GENERIC HISTORICAL  10/15/2016   IR ANGIOGRAM EXTREMITY RIGHT 10/15/2016 Luanne Bras, MD [facility]  . IR GENERIC HISTORICAL  10/15/2016   IR ANGIO INTRA EXTRACRAN SEL COM CAROTID INNOMINATE UNI R MOD SED 10/15/2016 Luanne Bras, MD [facility]  . MANDIBLE SURGERY    . RADIOLOGY WITH ANESTHESIA N/A 10/15/2016   Procedure: RADIOLOGY WITH ANESTHESIA;  Surgeon: Luanne Bras, MD;  Location: [place];  Service: Radiology;  Laterality: N/A;   Patient Active Problem List   Diagnosis Date Noted  . Paroxysmal atrial fibrillation (HCC)   .  Respiratory failure ([place])   . Endotracheally intubated   . Pneumothorax on right   . Ventilator dependent ([place])   . Thrombosis of right carotid artery   . Stroke (cerebrum) ([place]) 10/15/2016  . Left hip pain 09/08/2016  . Primary cancer of right upper lobe of lung ([place]) 05/05/2016  . Trapezius muscle spasm 01/09/2016  . Type 2 diabetes mellitus with stage 3 chronic kidney disease, with long-term current use of insulin ([place]) 01/02/2016  . Essential hypertension 01/02/2016  .  COPD, severe ([place]) 02/15/2015  . Chronic depression 02/15/2015  . CKD (chronic kidney disease), stage III 02/15/2015  . Acute anxiety 02/15/2015  . Hyperlipidemia 02/15/2015  . Diabetes ([place]) 02/15/2015      Prior to Admission medications   Medication Sig Start Date End Date Taking? Authorizing Provider  albuterol (PROVENTIL HFA;VENTOLIN HFA) 108 (90 Base) MCG/ACT inhaler Inhale 2 puffs into the lungs every 6 (six) hours as needed for wheezing or shortness of breath.   Yes Historical Provider, MD  atorvastatin (LIPITOR) 10 MG tablet Take 10 mg by mouth daily.   Yes Historical Provider, MD  BYDUREON 2 MG PEN INJECT '2MG'$  SUBCUTANEOUSLY WEEKLY 10/29/16  Yes Kathrine Haddock, NP  enoxaparin (LOVENOX) 80 MG/0.8ML injection Inject 0.7 mLs (70 mg total) into the skin every 12 (twelve) hours. 10/27/16  Yes Donzetta Starch, NP  feeding supplement, ENSURE ENLIVE, (ENSURE ENLIVE) LIQD Take 237 mLs by mouth 2 (two) times daily between meals. 10/28/16  Yes Donzetta Starch, NP  Insulin Glargine (TOUJEO SOLOSTAR) 300 UNIT/ML SOPN Inject 30 Units as directed daily. 06/02/16  Yes Kathrine Haddock, NP  ipratropium-albuterol (DUONEB) 0.5-2.5 (3) MG/3ML SOLN Take 3 mLs by nebulization 3 (three) times daily. 10/27/16  Yes Donzetta Starch, NP  LORazepam (ATIVAN) 0.5 MG tablet TAKE 1 TABLET BY MOUTH TWICE DAILY 10/28/16  Yes Kathrine Haddock, NP  metoprolol tartrate (LOPRESSOR) 25 MG tablet Take 1 tablet (25 mg total) by mouth 3 (three) times daily. 10/27/16  Yes Donzetta Starch, NP  ondansetron (ZOFRAN) 8 MG tablet Take 1 tablet by mouth 2 (two) times daily as needed. 05/09/16  Yes Historical Provider, MD  oxyCODONE (OXYCONTIN) 10 mg 12 hr tablet Take 1 tablet (10 mg total) by mouth every 12 (twelve) hours. 10/30/16  Yes Sindy [place], MD  Oxycodone HCl 10 MG TABS Take 0.5-1 tablets (5-10 mg total) by mouth every 4 (four) hours as needed. 10/27/16  Yes Donzetta Starch, NP  pantoprazole (PROTONIX) 40 MG tablet Take 1 tablet (40 mg total) by mouth  daily. 05/05/16  Yes Kathrine Haddock, NP  predniSONE (DELTASONE) 10 MG tablet Take 4 tablets (40 mg total) by mouth daily with breakfast. Taper dose: '40mg'$  x 2 days, 30 mg x 2 days, '20mg'$  x 2 days, 10 mg x 2 days, 5 mg (1/2 tab) x 2 days then stop 10/28/16  Yes Donzetta Starch, NP  ranitidine (ZANTAC) 150 MG tablet Take 150 mg by mouth daily as needed for heartburn.    Yes Historical Provider, MD  tamsulosin (FLOMAX) 0.4 MG CAPS capsule Take 1 capsule (0.4 mg total) by mouth daily. 10/28/16  Yes Donzetta Starch, NP    Allergies Patient has no known allergies.    Social History Social History  Substance Use Topics  . Smoking status: Former Smoker    Years: 20.00    Quit date: 04/22/2014  . Smokeless tobacco: Never Used  . Alcohol use No    Review of Systems Patient denies  headaches, rhinorrhea, blurry vision, numbness, shortness of breath, chest pain, edema, cough, abdominal pain, nausea, vomiting, diarrhea, dysuria, fevers, rashes or hallucinations unless otherwise stated above in HPI. ____________________________________________   PHYSICAL EXAM:  VITAL SIGNS: Vitals:   10/30/16 1832 10/30/16 1930  BP: (!) 179/131 92/65  Pulse: (!) 161 (!) 153  Resp: (!) 30 (!) 26  Temp: 98.4 F (36.9 C)     Constitutional: critically ill appearing in acute respiratory distress Eyes: Conjunctivae are normal. PERRL. EOMI. Head: Atraumatic. Nose: No congestion/rhinnorhea. Mouth/Throat: Mucous membranes are moist.  Oropharynx non-erythematous. Neck: No stridor. Painless ROM. No cervical spine tenderness to palpation Hematological/Lymphatic/Immunilogical: No cervical lymphadenopathy. Cardiovascular: tachycardic, irregularly irregular.   Grossly normal heart sounds.  Good peripheral circulation. Respiratory:tachypneic, use of accessory muscles.  Diffuse expiratory wheezing bilaterally Gastrointestinal: Soft and nontender. No distention. No abdominal bruits. No CVA tenderness. Musculoskeletal: No lower  extremity tenderness nor edema.  No joint effusions. Neurologic:  Normal speech and language. No gross focal neurologic deficits are appreciated. No gait instability. Skin:  Skin is warm, dry and intact. No rash noted. Psychiatric: Mood and affect are normal. Speech and behavior are normal.  ____________________________________________   LABS (all labs ordered are listed, but only abnormal results are displayed)  Results for orders placed or performed during the hospital encounter of 10/30/16 (from the past 24 hour(s))  CBC with Differential/Platelet     Status: Abnormal   Collection Time: 10/30/16  6:35 PM  Result Value Ref Range   WBC 19.6 (H) 3.8 - 10.6 K/uL   RBC 3.87 (L) 4.40 - 5.90 MIL/uL   Hemoglobin 11.4 (L) 13.0 - 18.0 g/dL   HCT 34.6 (L) 40.0 - 52.0 %   MCV 89.4 80.0 - 100.0 fL   MCH 29.4 26.0 - 34.0 pg   MCHC 32.9 32.0 - 36.0 g/dL   RDW 15.2 (H) 11.5 - 14.5 %   Platelets 229 150 - 440 K/uL   Neutrophils Relative % 93 %   Neutro Abs 18.2 (H) 1.4 - 6.5 K/uL   Lymphocytes Relative 3 %   Lymphs Abs 0.6 (L) 1.0 - 3.6 K/uL   Monocytes Relative 2 %   Monocytes Absolute 0.5 0.2 - 1.0 K/uL   Eosinophils Relative 1 %   Eosinophils Absolute 0.1 0 - 0.7 K/uL   Basophils Relative 1 %   Basophils Absolute 0.2 (H) 0 - 0.1 K/uL  Blood gas, arterial     Status: Abnormal   Collection Time: 10/30/16  6:50 PM  Result Value Ref Range   FIO2 100.00    Delivery systems BILEVEL POSITIVE AIRWAY PRESSURE    Inspiratory PAP 12    Expiratory PAP 6    pH, Arterial 7.36 7.350 - 7.450   pCO2 arterial 47 32.0 - 48.0 mmHg   pO2, Arterial 197 (H) 83.0 - 108.0 mmHg   Bicarbonate 26.6 20.0 - 28.0 mmol/L   Acid-Base Excess 0.6 0.0 - 2.0 mmol/L   O2 Saturation 99.7 %   Patient temperature 37.0    Collection site RIGHT RADIAL    Sample type ARTERIAL DRAW    Allens test (pass/fail) PASS PASS   ____________________________________________  EKG My review and personal interpretation at Time:  18:28   Indication: sob  Rate: 156  Rhythm: afib with rvr Axis: left Other: non specific st elevation likely rate dependent ____________________________________________  RADIOLOGY  I personally reviewed all radiographic images ordered to evaluate for the above acute complaints and reviewed radiology reports and findings.  These findings  were personally discussed with the patient.  Please see medical record for radiology report.  ____________________________________________   PROCEDURES  Procedure(s) performed:  Procedures    Critical Care performed: yes CRITICAL CARE Performed by: Merlyn Lot   Total critical care time: 50 minutes  Critical care time was exclusive of separately billable procedures and treating other patients.  Critical care was necessary to treat or prevent imminent or life-threatening deterioration.  Critical care was time spent personally by me on the following activities: development of treatment plan with patient and/or surrogate as well as nursing, discussions with consultants, evaluation of patient's response to treatment, examination of patient, obtaining history from patient or surrogate, ordering and performing treatments and interventions, ordering and review of laboratory studies, ordering and review of radiographic studies, pulse oximetry and re-evaluation of patient's condition.  ____________________________________________   INITIAL IMPRESSION / ASSESSMENT AND PLAN / ED COURSE  Pertinent labs & imaging results that were available during my care of the patient were reviewed by me and considered in my medical decision making (see chart for details).  DDX: Asthma, copd, CHF, pna, ptx, malignancy, Pe, anemia   Ralph Lopez is a 65 y.o. who presents to the ED with acute respiratory distress. Patient critically ill appearing. Placed on BiPAP after stat chest x-ray showed no evidence of pneumothorax. Bedside echo shows hyperdynamic heart.  Given his diffuse wheezing and concern for COPD exacerbation. Also concern for pneumonia as patient with recent hospitalization. Patient also at high risk for pulmonary embolism given his known malignancy but patient has been on Lovenox. However given his tachycardia and abrupt onset do feel patient will require CT imaging to further characterize.  The patient will be placed on continuous pulse oximetry and telemetry for monitoring.  Laboratory evaluation will be sent to evaluate for the above complaints.     Clinical Course as of Oct 30 2128  Thu Oct 30, 2016  1917 Patient now speaking and better sentences states that BiPAP was helping. Blood gases fairly reassuring. I do suspect this is underlying COPD but based on his hypercoagulable state we'll her CT angiogram to further evaluate for pulmonary embolism.  [PR]  2035 Patient is clinically much improved. Distantly tachycardic but he states that he feels much better. Does appear well perfused this point. I will continue to seek rate control.  PAtient will require admission to hospital for further evaluation of his acute respiratory failure.  [PR]  2103 Patient continued to clinically improve. Still tachycardic with soft blood pressure. Bedside rush exam shows evidence of hyperdynamic heart and nearly complete collapsing of the IVC. Consistent with additional volume depletion. Patient placed on additional IV fluids for resuscitation with pressure bag with improvement in his blood pressure. We'll continue to monitor.  [PR]    Clinical Course User Index [PR] Merlyn Lot, MD     ____________________________________________   FINAL CLINICAL IMPRESSION(S) / ED DIAGNOSES  Final diagnoses:  Acute respiratory failure with hypoxia (HCC)  Atrial fibrillation with rapid ventricular response (HCC)  Sepsis, due to unspecified organism ([place])  Malignant neoplasm of lung, unspecified laterality, unspecified part of lung ([place])      NEW MEDICATIONS  STARTED DURING THIS VISIT:  New Prescriptions   No medications on file     Note:  This document was prepared using Dragon voice recognition software and may include unintentional dictation errors.    Merlyn Lot, MD 10/30/16 2132

## 2016-10-30 NOTE — ED Notes (Signed)
Report given to [place], Therapist, sports.

## 2016-10-30 NOTE — Telephone Encounter (Signed)
Called and let Skeet Simmer know that Malachy Mood said orders were OK.

## 2016-10-30 NOTE — Telephone Encounter (Signed)
Left message to keep appt on 4/18

## 2016-10-30 NOTE — ED Triage Notes (Signed)
Pt presents to ED via [facility] for [facility]. Per EMS [facility] started at approx 1730 this evening. EMS reports patient was D/C from [facility] on Monday after being worked up for a code stroke. EMS also reports that patient had had a chest tube due to clots and the chest tube was removed on Saturday. Per EMS pt 90% on 10L NRB at this time.

## 2016-10-30 NOTE — H&P (Signed)
History and Physical   [facility] - [place] @ [facility] Admission History and Physical [employer], D.O.    Patient Name: Ralph Lopez MR#: [id number] Date of Birth: 17-May-1951 Date of Admission: 10/30/2016  Referring MD/NP/PA: Dr. Quentin Cornwall Primary Care Physician: Kathrine Haddock, NP Patient coming from: Home Outpatient Specialists: Dr. Grayland Ormond, Elsie Lincoln,    Chief Complaint:  Chief Complaint  Patient presents with  . Shortness of Breath    HPI: Ralph Lopez is a 66 y.o. 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    Patient has had a comp. Recent medical history including a hospitalization at [facility] for acute left MCA stroke which was revascularized. Upon extubation from the procedure he had a respiratory arrest and was reintubated. A prolonged stay in the ICU with complications including chest tube. He was recently discharged home where he is receiving home physical therapy and rehabilitation. His wife states that this morning and one episode of vomiting about 6 hours later he developed acute onset shortness of breath which was not responding to nebulizer therapy. She brought him to the emergency department where he is on BiPAP. He was found to meet sepsis criteria with possibility of bilateral lower lobe pneumonia. He was also found to be in A. fib with rapid ventricular response for which she was placed on a Cardizem drip with good response.  Other than shortness of breath, nausea and vomiting patient denies any other complaints including fevers and chills. He states that he feels significantly better.   Otherwise there has been no change in status. Patient has been taking medication as prescribed and there has been no recent change in medication or diet.  No recent antibiotics.  There has been no recent  travel or  sick contacts.    EMS/ED Course: Patient received 2 L of IV fluids, cefepime, vancomycin, Cardizem drip.  Review of Systems:  CONSTITUTIONAL: No fever/chills, fatigue, weakness, weight gain/loss, headache. EYES: No blurry or double vision. ENT: No tinnitus, postnasal drip, redness or soreness of the oropharynx. RESPIRATORY: Positive cough, dyspnea, wheeze.  No hemoptysis.  CARDIOVASCULAR: No chest pain, palpitations, syncope, orthopnea. No lower extremity edema.  GASTROINTESTINAL: Positive nausea, vomiting. Negative abdominal pain, diarrhea, constipation.  No hematemesis, melena or hematochezia. GENITOURINARY: No dysuria, frequency, hematuria. ENDOCRINE: No polyuria or nocturia. No heat or cold intolerance. HEMATOLOGY: No anemia, bruising, bleeding. INTEGUMENTARY: No rashes, ulcers, lesions. MUSCULOSKELETAL: No arthritis, gout, dyspnea. NEUROLOGIC: No numbness, tingling, ataxia, seizure-type activity, weakness. PSYCHIATRIC: No anxiety, depression, insomnia.   Past Medical History:  Diagnosis Date  . Anxiety   . Cancer ([place])    Stage IV Lung  . Chronic kidney disease   . COPD (chronic obstructive pulmonary disease) ([place])   . Depression   . Diabetes mellitus without complication ([place])   . Hyperlipidemia   . Hypertension     Past Surgical History:  Procedure Laterality Date  . CAROTID-SUBCLAVIAN BYPASS GRAFT Right 10/16/2016   Procedure: RIGHT CAROTID-SUBCLAVIAN THROMBECTOMY;  Surgeon: Waynetta Sandy, MD;  Location: [place];  Service: Vascular;  Laterality: Right;  . EYE SURGERY    . FLEXIBLE BRONCHOSCOPY N/A 04/28/2016   Procedure: FLEXIBLE BRONCHOSCOPY;  Surgeon: [name], MD;  Location: [facility];  Service: Pulmonary;  Laterality: N/A;  . IR GENERIC HISTORICAL  10/15/2016   IR PERCUTANEOUS ART THROMBECTOMY/INFUSION INTRACRANIAL INC DIAG ANGIO 10/15/2016 Sanjeev  Estanislado Pandy, MD [facility]  . IR GENERIC HISTORICAL  10/15/2016   IR ANGIOGRAM EXTREMITY RIGHT 10/15/2016  Luanne Bras, MD [facility]  . IR GENERIC HISTORICAL  10/15/2016   IR ANGIO INTRA EXTRACRAN SEL COM CAROTID INNOMINATE UNI R MOD SED 10/15/2016 Luanne Bras, MD [facility]  . MANDIBLE SURGERY    . RADIOLOGY WITH ANESTHESIA N/A 10/15/2016   Procedure: RADIOLOGY WITH ANESTHESIA;  Surgeon: Luanne Bras, MD;  Location: [place];  Service: Radiology;  Laterality: N/A;     reports that he quit smoking about 2 years ago. He quit after 20.00 years of use. He has never used smokeless tobacco. He reports that he does not drink alcohol or use drugs.  No Known Allergies  Family History  Problem Relation Age of Onset  . Diabetes Mother   . Hypertension Mother   . Breast cancer Mother   . Thrombosis Mother   . Heart disease Father     MI  . Breast cancer Sister   . Colon cancer Brother   . Lung cancer Brother     Prior to Admission medications   Medication Sig Start Date End Date Taking? Authorizing Provider  albuterol (PROVENTIL HFA;VENTOLIN HFA) 108 (90 Base) MCG/ACT inhaler Inhale 2 puffs into the lungs every 6 (six) hours as needed for wheezing or shortness of breath.   Yes Historical Provider, MD  atorvastatin (LIPITOR) 10 MG tablet Take 10 mg by mouth daily.   Yes Historical Provider, MD  BYDUREON 2 MG PEN INJECT '2MG'$  SUBCUTANEOUSLY WEEKLY 10/29/16  Yes Kathrine Haddock, NP  enoxaparin (LOVENOX) 80 MG/0.8ML injection Inject 0.7 mLs (70 mg total) into the skin every 12 (twelve) hours. 10/27/16  Yes Donzetta Starch, NP  feeding supplement, ENSURE ENLIVE, (ENSURE ENLIVE) LIQD Take 237 mLs by mouth 2 (two) times daily between meals. 10/28/16  Yes Donzetta Starch, NP  Insulin Glargine (TOUJEO SOLOSTAR) 300 UNIT/ML SOPN Inject 30 Units as directed daily. 06/02/16  Yes Kathrine Haddock, NP  ipratropium-albuterol (DUONEB) 0.5-2.5 (3) MG/3ML SOLN Take 3 mLs by nebulization 3 (three) times daily. 10/27/16  Yes Donzetta Starch, NP  LORazepam (ATIVAN) 0.5 MG tablet TAKE 1 TABLET BY MOUTH TWICE DAILY  10/28/16  Yes Kathrine Haddock, NP  metoprolol tartrate (LOPRESSOR) 25 MG tablet Take 1 tablet (25 mg total) by mouth 3 (three) times daily. 10/27/16  Yes Donzetta Starch, NP  ondansetron (ZOFRAN) 8 MG tablet Take 1 tablet by mouth 2 (two) times daily as needed. 05/09/16  Yes Historical Provider, MD  oxyCODONE (OXYCONTIN) 10 mg 12 hr tablet Take 1 tablet (10 mg total) by mouth every 12 (twelve) hours. 10/30/16  Yes Sindy Guadeloupe, MD  Oxycodone HCl 10 MG TABS Take 0.5-1 tablets (5-10 mg total) by mouth every 4 (four) hours as needed. 10/27/16  Yes Donzetta Starch, NP  pantoprazole (PROTONIX) 40 MG tablet Take 1 tablet (40 mg total) by mouth daily. 05/05/16  Yes Kathrine Haddock, NP  predniSONE (DELTASONE) 10 MG tablet Take 4 tablets (40 mg total) by mouth daily with breakfast. Taper dose: '40mg'$  x 2 days, 30 mg x 2 days, '20mg'$  x 2 days, 10 mg x 2 days, 5 mg (1/2 tab) x 2 days then stop 10/28/16  Yes Donzetta Starch, NP  ranitidine (ZANTAC) 150 MG tablet Take 150 mg by mouth daily as needed for heartburn.    Yes Historical Provider, MD  tamsulosin (FLOMAX) 0.4 MG CAPS capsule Take 1 capsule (0.4 mg total) by mouth daily.  10/28/16  Yes Donzetta Starch, NP    Physical Exam: Vitals:   10/30/16 2053 10/30/16 2100 10/30/16 2137 10/30/16 2145  BP:  94/60 (!) 87/69 97/69  Pulse: (!) 166 (!) 121 (!) 127 (!) 123  Resp: (!) 27 (!) 27 (!) 21 (!) 25  Temp:      TempSrc:      SpO2: 99% 100% 100% 100%  Weight:      Height:        GENERAL: 66 y.o.-year-old White male patient, chronically ill-appearing lying in the bed in no acute distress.  Pleasant and cooperative.   HEENT: Head atraumatic, normocephalic. Pupils equal, round, reactive to light and accommodation. No scleral icterus. Extraocular muscles intact.  Mucus membranes moist. NECK: Supple, full range of motion. No JVD. CHEST: Diminished breath sounds at the bases with mild expiratory wheezing througout/  No use of accessory muscles of respiration.  No reproducible chest  wall tenderness.  CARDIOVASCULAR: Tachycardic, irregularly irregular.  No murmurs, rubs, or gallops appreciated. Cap refill <2 seconds. Pulses intact distally.  ABDOMEN: Soft, nondistended, nontender. No rebound, guarding, rigidity. Normoactive bowel sounds present in all four quadrants. No organomegaly or mass. EXTREMITIES: No pedal edema, cyanosis, or clubbing. No calf tenderness or Homan's sign.  NEUROLOGIC: The patient is alert and oriented x 3. Cranial nerves II through XII are grossly intact with no focal sensorimotor deficit. Muscle strength 5/5 in all extremities. Sensation intact. Gait not checked. PSYCHIATRIC:  Normal affect, mood, thought content. SKIN: Warm, dry, and intact without obvious rash, lesion, or ulcer.    Labs on Admission:  CBC:  Recent Labs Lab 10/24/16 0659 10/25/16 0451 10/26/16 0317 10/27/16 0426 10/30/16 1835  WBC 15.3* 12.2* 9.1 7.5 19.6*  NEUTROABS 13.1* 10.7*  --   --  18.2*  HGB 10.4* 9.2* 9.4* 9.6* 11.4*  HCT 31.8* 28.6* 29.1* 29.7* 34.6*  MCV 88.3 88.5 87.7 88.1 89.4  PLT 267 240 246 216 237   Basic Metabolic Panel:  Recent Labs Lab 10/24/16 0330 10/24/16 0343 10/25/16 0451 10/27/16 0426 10/30/16 1835  NA  --  137 134* 135 133*  K  --  3.4* 3.5 4.1 4.8  CL  --  97* 96* 99* 98*  CO2  --  '28 27 29 27  '$ GLUCOSE  --  100* 107* 127* 227*  BUN  --  24* 22* 17 18  CREATININE  --  0.89 1.01 1.03 1.20  CALCIUM  --  9.2 9.2 9.4 9.4  MG 2.0  --  1.8 2.0  --   PHOS  --  3.5 3.1  --   --    GFR: Estimated Creatinine Clearance: 59.5 mL/min (by C-G formula based on SCr of 1.2 mg/dL). Liver Function Tests:  Recent Labs Lab 10/24/16 0343 10/25/16 0451  ALBUMIN 2.2* 2.0*   No results for input(s): LIPASE, AMYLASE in the last 168 hours. No results for input(s): AMMONIA in the last 168 hours. Coagulation Profile:  Recent Labs Lab 10/24/16 0330  INR 1.31   Cardiac Enzymes:  Recent Labs Lab 10/30/16 1835  TROPONINI <0.03   BNP (last  3 results) No results for input(s): PROBNP in the last 8760 hours. HbA1C: No results for input(s): HGBA1C in the last 72 hours. CBG:  Recent Labs Lab 10/26/16 1140 10/26/16 1610 10/26/16 2145 10/27/16 0631 10/27/16 1138  GLUCAP 191* 236* 225* 108* 142*   Lipid Profile: No results for input(s): CHOL, HDL, LDLCALC, TRIG, CHOLHDL, LDLDIRECT in the last 72 hours. Thyroid Function  Tests: No results for input(s): TSH, T4TOTAL, FREET4, T3FREE, THYROIDAB in the last 72 hours. Anemia Panel: No results for input(s): VITAMINB12, FOLATE, FERRITIN, TIBC, IRON, RETICCTPCT in the last 72 hours. Urine analysis:    Component Value Date/Time   COLORURINE STRAW (A) 10/15/2016 1721   APPEARANCEUR CLEAR 10/15/2016 1721   LABSPEC 1.027 10/15/2016 1721   PHURINE 6.0 10/15/2016 1721   GLUCOSEU NEGATIVE 10/15/2016 1721   HGBUR NEGATIVE 10/15/2016 1721   BILIRUBINUR NEGATIVE 10/15/2016 1721   KETONESUR NEGATIVE 10/15/2016 1721   PROTEINUR NEGATIVE 10/15/2016 1721   NITRITE NEGATIVE 10/15/2016 1721   LEUKOCYTESUR NEGATIVE 10/15/2016 1721   Sepsis Labs: '@LABRCNTIP'$ (procalcitonin:4,lacticidven:4) )No results found for this or any previous visit (from the past 240 hour(s)).   Radiological Exams on Admission: Dg Chest Portable 1 View  Result Date: 10/30/2016 CLINICAL DATA:  Shortness of Breath EXAM: PORTABLE CHEST 1 VIEW COMPARISON:  10/27/2016.  CT scan 10/17/2016 FINDINGS: Right hilar mass again noted. Bilateral pulmonary metastases are similar prior. Asymmetric elevation right hemidiaphragm unchanged. Cardiopericardial silhouette is at upper limits of normal for size. Nonacute right rib fractures evident. Telemetry leads overlie the chest. IMPRESSION: Stable exam. Right hilar mass with bilateral pulmonary metastases. No new or acute interval findings. Electronically Signed   By: Misty Stanley M.D.   On: 10/30/2016 18:45    EKG: Atrophic fibrillation with rapid ventricular response at 156 bpm with  leftward axis and nonspecific ST-T wave changes.   Assessment/Plan  This is a 66 y.o. 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, CK D, COPD, diabetes, hypertension, hyperlipidemia now being admitted with:  Acute respiratory failure in a patient with known COPD Sepsis likely secondary to hospital-acquired pneumonia bilateral Atrial fibrillation with rapid ventricular response  - Admit to inpatient ICU with telemetry monitoring - IV antibiotics: Cefepime and vancomycin per pharmacy - Med-Neb and expectorant therapy as needed -Solu-Medrol taper - IV fluid hydration - Pressors if needed for hypotension - Continue BiPAP O2, nothing by mouth on BiPAP - Follow up blood,urine & sputum cultures -Check flu swab - Repeat CBC in am.  - CCM and Infectious disease consultations have been requested  #. History of hypertension -Hold metoprolol for now secondary to hypotension  #. History of GERD -Continue Protonix and Zantac  #. History of BPH - Continue Flomax  #. History of diabetes - Continue Toujeo -Regular insulin sliding scale coverage every 4 hours  #. History of DVT/PE - Continue Lovenox 70 mg every 12  #. History of hyperlipidemia - Continue Lipitor  Admission status: Inpatient ICU IV Fluids: Normal saline Diet/Nutrition: Nothing by mouth on BiPAP Consults called: Critical care  DVT Px: Lovenox, SCDs and early ambulation. Code Status: Full Code  Disposition Plan: To be determined in 3-4 days  All the records are reviewed and case discussed with ED provider. Management plans discussed with the patient and/or family who express understanding and agree with plan of care.  Dayven Linsley D.O. on 10/30/2016 at 10:04 PM Between 7am to 6pm - Pager - [phone] After 6pm go to [email or web address] - [employer] [facility] [place] [facility] Office [phone] CC: Primary care physician; Kathrine Haddock, NP   10/30/2016, 10:04 PM

## 2016-10-30 NOTE — Telephone Encounter (Signed)
Pt called in earlier and left message that is having increased pain in hip that is not relieve with current pain medication. Per Dr. Janese Banks, will prescribe oxycontin q12hr that pt may take in addition to oxycodone for break through pain. Spoke with pt's wife, Jackelyn Poling, and informed her of this information. She verbalized understanding and stated will get his sister to pick up the prescription today. Instructed Debbie to call back if pain is not relieved.

## 2016-10-30 NOTE — Progress Notes (Addendum)
Pharmacy Antibiotic Note  Ralph Lopez is a 66 y.o. male admitted on 10/30/2016 with pneumonia.  Pharmacy has been consulted for vancomcyin dosing.  Plan: Patient will receive vanc 1 g IV x 1 in ED -- will administer an addition vanc 500 mg IV x 1 for a total load of 1.5g (22 mg/kg load). Will initiate vanc 750 mg IV q12h to start 4/13 @ 1000. Will draw a vanc trough 4/14 @ 0900 prior to 4th dose. Trough ss (prior to 4th dose): 16.9 ~ 17 mcg/mL Ke 0.0538 Vd 48L Scr 1.03 - 1.20 Scr is climbing up -- if it continue to climb up will draw the trough sooner. Will start cefepime 2g IV q12h. Will continue to monitor renal function.  Height: '5\' 10"'$  (177.8 cm) Weight: 151 lb (68.5 kg) IBW/kg (Calculated) : 73  Temp (24hrs), Avg:98.4 F (36.9 C), Min:98.4 F (36.9 C), Max:98.4 F (36.9 C)   Recent Labs Lab 10/24/16 0343 10/24/16 0659 10/25/16 0451 10/26/16 0317 10/27/16 0426 10/30/16 1835  WBC  --  15.3* 12.2* 9.1 7.5 19.6*  CREATININE 0.89  --  1.01  --  1.03 1.20    Estimated Creatinine Clearance: 59.5 mL/min (by C-G formula based on SCr of 1.2 mg/dL).    No Known Allergies  Antimicrobials this admission: 4/12 CFP >>   Microbiology results: 4/12 MRSA PCR: NG  Thank you for allowing pharmacy to be a part of this patient's care.  Tobie Lords, PharmD, BCPS Clinical Pharmacist 10/30/2016

## 2016-10-31 ENCOUNTER — Ambulatory Visit: Admission: RE | Admit: 2016-10-31 | Payer: BLUE CROSS/BLUE SHIELD | Source: Ambulatory Visit

## 2016-10-31 DIAGNOSIS — I4891 Unspecified atrial fibrillation: Secondary | ICD-10-CM

## 2016-10-31 DIAGNOSIS — J9601 Acute respiratory failure with hypoxia: Secondary | ICD-10-CM

## 2016-10-31 DIAGNOSIS — C3490 Malignant neoplasm of unspecified part of unspecified bronchus or lung: Secondary | ICD-10-CM

## 2016-10-31 LAB — CBC
HCT: 28.7 % — ABNORMAL LOW (ref 40.0–52.0)
Hemoglobin: 9.2 g/dL — ABNORMAL LOW (ref 13.0–18.0)
MCH: 28.5 pg (ref 26.0–34.0)
MCHC: 32.1 g/dL (ref 32.0–36.0)
MCV: 88.8 fL (ref 80.0–100.0)
PLATELETS: 168 10*3/uL (ref 150–440)
RBC: 3.23 MIL/uL — ABNORMAL LOW (ref 4.40–5.90)
RDW: 15.3 % — AB (ref 11.5–14.5)
WBC: 21.9 10*3/uL — ABNORMAL HIGH (ref 3.8–10.6)

## 2016-10-31 LAB — COMPREHENSIVE METABOLIC PANEL
ALBUMIN: 2.4 g/dL — AB (ref 3.5–5.0)
ALK PHOS: 55 U/L (ref 38–126)
ALT: 38 U/L (ref 17–63)
ALT: 40 U/L (ref 17–63)
ANION GAP: 4 — AB (ref 5–15)
AST: 24 U/L (ref 15–41)
AST: 31 U/L (ref 15–41)
Albumin: 2.3 g/dL — ABNORMAL LOW (ref 3.5–5.0)
Alkaline Phosphatase: 51 U/L (ref 38–126)
Anion gap: 5 (ref 5–15)
BUN: 18 mg/dL (ref 6–20)
BUN: 18 mg/dL (ref 6–20)
CALCIUM: 8.9 mg/dL (ref 8.9–10.3)
CHLORIDE: 105 mmol/L (ref 101–111)
CHLORIDE: 105 mmol/L (ref 101–111)
CO2: 25 mmol/L (ref 22–32)
CO2: 25 mmol/L (ref 22–32)
CREATININE: 1.14 mg/dL (ref 0.61–1.24)
Calcium: 9.1 mg/dL (ref 8.9–10.3)
Creatinine, Ser: 1.22 mg/dL (ref 0.61–1.24)
GFR calc non Af Amer: >60 mL/min (ref >60)
GFR calc non Af Amer: >60 mL/min (ref >60)
GLUCOSE: 183 mg/dL — AB (ref 65–99)
Glucose, Bld: 184 mg/dL — ABNORMAL HIGH (ref 65–99)
Potassium: 4.1 mmol/L (ref 3.5–5.1)
Potassium: 4.6 mmol/L (ref 3.5–5.1)
SODIUM: 134 mmol/L — AB (ref 135–145)
SODIUM: 135 mmol/L (ref 135–145)
Total Bilirubin: 0.6 mg/dL (ref 0.3–1.2)
Total Bilirubin: 0.7 mg/dL (ref 0.3–1.2)
Total Protein: 5.2 g/dL — ABNORMAL LOW (ref 6.5–8.1)
Total Protein: 5.4 g/dL — ABNORMAL LOW (ref 6.5–8.1)

## 2016-10-31 LAB — PROCALCITONIN
Procalcitonin: 22.32 ng/mL
Procalcitonin: 22.87 ng/mL

## 2016-10-31 LAB — LACTIC ACID, PLASMA
LACTIC ACID, VENOUS: 1.1 mmol/L (ref 0.5–1.9)
Lactic Acid, Venous: 2.5 mmol/L (ref 0.5–1.9)

## 2016-10-31 LAB — PROTIME-INR
INR: 1.16
Prothrombin Time: 14.9 seconds (ref 11.4–15.2)

## 2016-10-31 LAB — MRSA PCR SCREENING: MRSA BY PCR: NEGATIVE

## 2016-10-31 LAB — APTT: aPTT: 43 seconds — ABNORMAL HIGH (ref 24–36)

## 2016-10-31 MED ORDER — METOPROLOL TARTRATE 25 MG PO TABS
25.0000 mg | ORAL_TABLET | Freq: Three times a day (TID) | ORAL | Status: DC
  Administered 2016-10-31 – 2016-11-02 (×8): 25 mg via ORAL
  Filled 2016-10-31 (×8): qty 1

## 2016-10-31 MED ORDER — VANCOMYCIN HCL IN DEXTROSE 1-5 GM/200ML-% IV SOLN
1000.0000 mg | Freq: Two times a day (BID) | INTRAVENOUS | Status: DC
  Administered 2016-10-31 – 2016-11-01 (×2): 1000 mg via INTRAVENOUS
  Filled 2016-10-31 (×4): qty 200

## 2016-10-31 NOTE — Progress Notes (Signed)
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 Presently on noninvasive ventilation. Leukocytosis noted with a white count of 22,000. Patient is presently on Maxipime, vancomycin, Solu-Medrol, albuterol, Atrovent and noninvasive ventilation. Patient is a full code per patient and family's request  Anemia. No evidence of active bleeding  Hyperglycemia. On insulin with sliding scale coverage  Lactic acidosis.   Name: Ralph Lopez MRN: [id number] DOB: 1951-04-28    ADMISSION DATE:  10/30/2016   Subjective: Patient states he is feeling somewhat better, still is requiring noninvasive ventilation, has not been tried off of NIV  PAST MEDICAL HISTORY :  Past Medical History:  Diagnosis Date  . Anxiety   . Cancer ([place])    Stage IV Lung  . Chronic kidney disease   . COPD (chronic obstructive pulmonary disease) ([place])   . Depression   . Diabetes mellitus without complication ([place])   . Hyperlipidemia   . Hypertension    Past Surgical History:  Procedure Laterality Date  . CAROTID-SUBCLAVIAN BYPASS GRAFT Right 10/16/2016   Procedure: RIGHT CAROTID-SUBCLAVIAN THROMBECTOMY;  Surgeon: Waynetta Sandy, MD;  Location: [place];  Service: Vascular;  Laterality: Right;  . EYE SURGERY    . FLEXIBLE BRONCHOSCOPY N/A 04/28/2016   Procedure: FLEXIBLE BRONCHOSCOPY;  Surgeon: [name], MD;  Location: [facility];  Service: Pulmonary;  Laterality: N/A;  . IR GENERIC HISTORICAL  10/15/2016   IR PERCUTANEOUS ART THROMBECTOMY/INFUSION INTRACRANIAL INC DIAG ANGIO 10/15/2016 Luanne Bras, MD [facility]  . IR GENERIC HISTORICAL  10/15/2016   IR ANGIOGRAM EXTREMITY RIGHT 10/15/2016 Luanne Bras, MD [facility]  . IR GENERIC HISTORICAL  10/15/2016   IR ANGIO INTRA EXTRACRAN SEL COM CAROTID INNOMINATE  UNI R MOD SED 10/15/2016 Luanne Bras, MD [facility]  . MANDIBLE SURGERY    . RADIOLOGY WITH ANESTHESIA N/A 10/15/2016   Procedure: RADIOLOGY WITH ANESTHESIA;  Surgeon: Luanne Bras, MD;  Location: [facility];  Service: Radiology;  Laterality: N/A;   Prior to Admission medications   Medication Sig Start Date End Date Taking? Authorizing Provider  albuterol (PROVENTIL HFA;VENTOLIN HFA) 108 (90 Base) MCG/ACT inhaler Inhale 2 puffs into the lungs every 6 (six) hours as needed for wheezing or shortness of breath.   Yes Historical Provider, MD  atorvastatin (LIPITOR) 10 MG tablet Take 10 mg by mouth daily.   Yes Historical Provider, MD  BYDUREON 2 MG PEN INJECT '2MG'$  SUBCUTANEOUSLY WEEKLY 10/29/16  Yes Kathrine Haddock, NP  enoxaparin (LOVENOX) 80 MG/0.8ML injection Inject 0.7 mLs (70 mg total) into the skin every 12 (twelve) hours. 10/27/16  Yes Donzetta Starch, NP  feeding supplement, ENSURE ENLIVE, (ENSURE ENLIVE) LIQD Take 237 mLs by mouth 2 (two) times daily between meals. 10/28/16  Yes Donzetta Starch, NP  Insulin Glargine (TOUJEO SOLOSTAR) 300 UNIT/ML SOPN Inject 30 Units as directed daily. 06/02/16  Yes Kathrine Haddock, NP  ipratropium-albuterol (DUONEB) 0.5-2.5 (3) MG/3ML SOLN Take 3 mLs by nebulization 3 (three) times daily. 10/27/16  Yes Donzetta Starch, NP  LORazepam (ATIVAN) 0.5 MG tablet TAKE 1 TABLET BY MOUTH TWICE DAILY 10/28/16  Yes Kathrine Haddock, NP  metoprolol tartrate (LOPRESSOR) 25 MG tablet Take 1 tablet (25 mg total) by mouth 3 (three) times daily. 10/27/16  Yes Donzetta Starch, NP  ondansetron (ZOFRAN) 8 MG tablet Take 1 tablet by mouth 2 (two)  times daily as needed. 05/09/16  Yes Historical Provider, MD  oxyCODONE (OXYCONTIN) 10 mg 12 hr tablet Take 1 tablet (10 mg total) by mouth every 12 (twelve) hours. 10/30/16  Yes Sindy Guadeloupe, MD  Oxycodone HCl 10 MG TABS Take 0.5-1 tablets (5-10 mg total) by mouth every 4 (four) hours as needed. 10/27/16  Yes Donzetta Starch, NP  pantoprazole (PROTONIX) 40 MG  tablet Take 1 tablet (40 mg total) by mouth daily. 05/05/16  Yes Kathrine Haddock, NP  predniSONE (DELTASONE) 10 MG tablet Take 4 tablets (40 mg total) by mouth daily with breakfast. Taper dose: '40mg'$  x 2 days, 30 mg x 2 days, '20mg'$  x 2 days, 10 mg x 2 days, 5 mg (1/2 tab) x 2 days then stop 10/28/16  Yes Donzetta Starch, NP  ranitidine (ZANTAC) 150 MG tablet Take 150 mg by mouth daily as needed for heartburn.    Yes Historical Provider, MD  tamsulosin (FLOMAX) 0.4 MG CAPS capsule Take 1 capsule (0.4 mg total) by mouth daily. 10/28/16  Yes Donzetta Starch, NP   No Known Allergies  FAMILY HISTORY:  Family History  Problem Relation Age of Onset  . Diabetes Mother   . Hypertension Mother   . Breast cancer Mother   . Thrombosis Mother   . Heart disease Father     MI  . Breast cancer Sister   . Colon cancer Brother   . Lung cancer Brother    SOCIAL HISTORY:  reports that he quit smoking about 2 years ago. He quit after 20.00 years of use. He has never used smokeless tobacco. He reports that he does not drink alcohol or use drugs.   VITAL SIGNS: Temp:  [98.4 F (36.9 C)-100 F (37.8 C)] 100 F (37.8 C) (04/13 0430) Pulse Rate:  [88-166] 94 (04/13 0700) Resp:  [17-30] 19 (04/13 0700) BP: (80-179)/(57-131) 121/78 (04/13 0700) SpO2:  [90 %-100 %] 100 % (04/13 0700) FiO2 (%):  [45 %-100 %] 45 % (04/13 0600) Weight:  [68.5 kg (151 lb)-71.7 kg (158 lb 1.1 oz)] 71.7 kg (158 lb 1.1 oz) (04/12 2355) HEMODYNAMICS:   VENTILATOR SETTINGS: FiO2 (%):  [45 %-100 %] 45 % INTAKE / OUTPUT:  Intake/Output Summary (Last 24 hours) at 10/31/16 0924 Last data filed at 10/31/16 0600  Gross per 24 hour  Intake          4727.42 ml  Output                0 ml  Net          4727.42 ml    Physical Examination:   VS: BP 121/78   Pulse 94   Temp 100 F (37.8 C) (Axillary)   Resp 19   Ht '5\' 7"'$  (1.702 m)   Wt 71.7 kg (158 lb 1.1 oz)   SpO2 100%   BMI 24.76 kg/m   General Appearance: No distress    Neuro:without focal findings, mental status, speech normal,. HEENT: PERRLA, EOM intact, no ptosis, no other lesions noticed;  Pulmonary: Markedly diminished breath sounds bilaterally Cardiovascular: Distant heart sounds, tachycardic at 105.    Abdomen: Benign, Soft, non-tender, No masses, hepatosplenomegaly, No lymphadenopathy Skin:   warm, no rashes, no ecchymosis  Musculoskeletal: normal, no cyanosis, clubbing, no edema, warm with normal capillary refill.    LABS: Reviewed   LABORATORY PANEL:   CBC  Recent Labs Lab 10/31/16 0419  WBC 21.9*  HGB 9.2*  HCT 28.7*  PLT 168  Chemistries   Recent Labs Lab 10/25/16 0451 10/27/16 0426  10/31/16 0419  NA 134* 135  < > 134*  K 3.5 4.1  < > 4.6  CL 96* 99*  < > 105  CO2 27 29  < > 25  GLUCOSE 107* 127*  < > 184*  BUN 22* 17  < > 18  CREATININE 1.01 1.03  < > 1.14  CALCIUM 9.2 9.4  < > 9.1  MG 1.8 2.0  --   --   PHOS 3.1  --   --   --   AST  --   --   < > 24  ALT  --   --   < > 38  ALKPHOS  --   --   < > 51  BILITOT  --   --   < > 0.7  < > = values in this interval not displayed.   Recent Labs Lab 10/26/16 1140 10/26/16 1610 10/26/16 2145 10/27/16 0631 10/27/16 1138 10/30/16 2358  GLUCAP 191* 236* 225* 108* 142* 183*    Recent Labs Lab 10/30/16 1850  PHART 7.36  PCO2ART 47  PO2ART 197*    Recent Labs Lab 10/25/16 0451 10/31/16 0017 10/31/16 0419  AST  --  31 24  ALT  --  40 38  ALKPHOS  --  55 51  BILITOT  --  0.6 0.7  ALBUMIN 2.0* 2.4* 2.3*    Cardiac Enzymes  Recent Labs Lab 10/30/16 1835  TROPONINI <0.03    RADIOLOGY:  Ct Angio Chest Pe W And/or Wo Contrast  Result Date: 10/30/2016 CLINICAL DATA:  66 y/o M; acute chest pain with concern for pulmonary embolus. History of pulmonary malignancy. EXAM: CT ANGIOGRAPHY CHEST WITH CONTRAST TECHNIQUE: Multidetector CT imaging of the chest was performed using the standard protocol during bolus administration of intravenous contrast.  Multiplanar CT image reconstructions and MIPs were obtained to evaluate the vascular anatomy. CONTRAST:  75 cc Isovue 370 COMPARISON:  10/17/2016 CT chest FINDINGS: Cardiovascular: There is a right suprahilar mass measuring up to 6.9 cm in the right suprahilar region (series 4, image 39), previously 65 mm, invading the right superior pulmonary vein and extending into the right aspect of the left atrium (series 4, image 52). Motion artifact in the lungs. No pulmonary embolus identified. There is narrowing of the right superior and middle lobe pulmonary artery is due to increasing invasion from right suprahilar are pulmonary mass. Normal caliber thoracic aorta with mild calcific atherosclerosis. Normal heart size. No pericardial effusion. Mild coronary artery calcification. Mediastinum/Nodes: Left hilar lymphadenopathy is increased in size from the prior study. Normal trachea, esophagus, and thyroid gland. Lungs/Pleura: Numerous metastasis are present within the lungs bilaterally which are increased in size from the prior CT for example in the left upper lobe there is a nodule measuring 29 x 27 mm, previously 26 x 25 mm (series 6, image 69). Trace residual right pneumothorax. There consolidations within the left upper lobe lingula and left lower lobe likely representing interval development of pneumonia. Upper Abdomen: No acute abnormality. Musculoskeletal: Air within the anterior chest wall is markedly decreased in comparison with the prior CT of the chest with small residual. Soft tissue lung nodule of the right anterior inferior chest wall subcutaneous fat measuring 16 mm is stable (series 4, image 71). Right lateral sixth and seventh mildly displaced acute rib fractures as seen on prior CT of the chest. Review of the MIP images confirms the above findings. IMPRESSION: 1. Moderate  respiratory motion artifact. No pulmonary embolus identified. 2. Interval development of consolidations within the left lung base  compatible with pneumonia. 3. Interval slight increase in size of right suprahilar mass and metastasis in the lungs bilaterally. 4. The right suprahilar mass invades the right superior pulmonary vein extending into the left atrium. 5. Trace residual right apical pneumothorax and small diminished volume of air in the anterior chest wall. 6. Stable mildly displaced right lateral sixth and seventh rib fractures. Electronically Signed   By: Kristine Garbe M.D.   On: 10/30/2016 22:07   Dg Chest Portable 1 View  Result Date: 10/30/2016 CLINICAL DATA:  Shortness of Breath EXAM: PORTABLE CHEST 1 VIEW COMPARISON:  10/27/2016.  CT scan 10/17/2016 FINDINGS: Right hilar mass again noted. Bilateral pulmonary metastases are similar prior. Asymmetric elevation right hemidiaphragm unchanged. Cardiopericardial silhouette is at upper limits of normal for size. Nonacute right rib fractures evident. Telemetry leads overlie the chest. IMPRESSION: Stable exam. Right hilar mass with bilateral pulmonary metastases. No new or acute interval findings. Electronically Signed   By: Misty Stanley M.D.   On: 10/30/2016 18:45       Hermelinda Dellen, DO Board Certified in Internal Medicine, Pulmonary Medicine, [place], and Sleep Medicine.  ICU Pager [phone] [facility] Office Number: [phone]  Patricia Pesa, M.D.  Merton Border, M.D   10/31/2016, 9:24 AM

## 2016-10-31 NOTE — Progress Notes (Signed)
CRITICAL VALUE ALERT  Critical value received:  Lactic acid 2.5  Date of notification:  10/31/16  Time of notification:  0109  Critical value read back:Yes.    Nurse who received alert:  Lear Ng RN  MD notified (1st page):  Gus Rankin NP  Time of first page:  0120  MD notified (2nd page):  Time of second pagBiency Varughesc NP:  Responding MD:    Time MD responded:  0120, NP in the unit

## 2016-10-31 NOTE — Progress Notes (Signed)
Pharmacy Antibiotic Note  Ralph Lopez is a 66 y.o. male admitted on 10/30/2016 with pneumonia.  Pharmacy has been consulted for vancomycin and cefepime dosing.  Plan: Continue cefepime 2g IV Q12hr.   Transition patient to vancomycin 1g IV Q12hr for goal trough of 15-20. Unless otherwise clincailly indicated, will obtain trough prior to am dose on 4/15.   Height: '5\' 7"'$  (170.2 cm) Weight: 158 lb 1.1 oz (71.7 kg) IBW/kg (Calculated) : 66.1  Temp (24hrs), Avg:98.8 F (37.1 C), Min:98 F (36.7 C), Max:100 F (37.8 C)   Recent Labs Lab 10/25/16 0451 10/26/16 0317 10/27/16 0426 10/30/16 1835 10/31/16 0017 10/31/16 0419  WBC 12.2* 9.1 7.5 19.6*  --  21.9*  CREATININE 1.01  --  1.03 1.20 1.22 1.14  LATICACIDVEN  --   --   --   --  2.5* 1.1    Estimated Creatinine Clearance: 60.4 mL/min (by C-G formula based on SCr of 1.14 mg/dL).    No Known Allergies  Antimicrobials this admission: Cefepime 4/12 >>  Vancomycin 4/12 >>   Dose adjustments this admission: 4/13 Vancomycin transitioned to q12hr.   Microbiology results: 4/12 Sputum: sent  4/12 MRSA PCR: negative  Pharmacy will continue to monitor and adjust per consult.   Teo Moede L 10/31/2016 3:11 PM

## 2016-10-31 NOTE — Progress Notes (Signed)
Inpatient Diabetes Program Recommendations  AACE/ADA: New Consensus Statement on Inpatient Glycemic Control (2015)  Target Ranges:  Prepandial:   less than 140 mg/dL      Peak postprandial:   less than 180 mg/dL (1-2 hours)      Critically ill patients:  140 - 180 mg/dL  Results for Ralph Lopez, Ralph Lopez (MRN [id number]) as of 10/31/2016 12:47  Ref. Range 10/30/2016 18:35 10/31/2016 00:17 10/31/2016 04:19  Glucose Latest Ref Range: 65 - 99 mg/dL 227 (H) 183 (H) 184 (H)   Results for Ralph Lopez, Ralph Lopez (MRN [id number]) as of 10/31/2016 12:47  Ref. Range 10/30/2016 23:58  Glucose-Capillary Latest Ref Range: 65 - 99 mg/dL 183 (H)  Results for Ralph Lopez, Ralph Lopez (MRN [id number]) as of 10/31/2016 12:47  Ref. Range 10/16/2016 05:50  Hemoglobin A1C Latest Ref Range: 4.8 - 5.6 % 5.8 (H)   Review of Glycemic Control  Diabetes history: DM2 Outpatient Diabetes medications: Toujeo 30 units daily, Bydureon 2 mg once a week Current orders for Inpatient glycemic control: Lantus 30 units daily  Inpatient Diabetes Program Recommendations: Insulin - Basal: Noted patient is ordered Lantus 30 units daily and has already received today. In reviewing chart, noted during last admission 10/15/16 to 10/27/16 patient was not ordered basal insulin. Also note patient is currently ordered Solumedrol 60 mg Q6H.  If patient experiences any hypoglycemia, please consider adjusting Lantus order. Correction (SSI): Please consider ordering CBGs with Novolog sensitive correction scale Q4H.  Thanks, Barnie Alderman, RN, MSN, CDE Diabetes Coordinator Inpatient Diabetes Program [phone] (Team Pager from 8am to 5pm)

## 2016-10-31 NOTE — Progress Notes (Signed)
[facility] [place] [place] NAME: Ralph Lopez    MR#:  [id number]  DATE OF BIRTH:  February 24, 1951  SUBJECTIVE:  CHIEF COMPLAINT:   Chief Complaint  Patient presents with  . Shortness of Breath    Hx of Lung cancer s/p chemoa nd radiation, recent stroke- and MCA revascularization with long hospital stay in [facility]- d/c 4 days ago- had vomiting and then in few hours very SOB- found to have b/l pneumonia and required Bipap initially, off on nasal canula now.  Also had A fib with RVR on presentation, but in NSR and off the drip since night  REVIEW OF SYSTEMS:  CONSTITUTIONAL: No fever, fatigue or weakness.  EYES: No blurred or double vision.  EARS, NOSE, AND THROAT: No tinnitus or ear pain.  RESPIRATORY: No cough, shortness of breath, wheezing or hemoptysis.  CARDIOVASCULAR: No chest pain, orthopnea, edema.  GASTROINTESTINAL: No nausea, vomiting, diarrhea or abdominal pain.  GENITOURINARY: No dysuria, hematuria.  ENDOCRINE: No polyuria, nocturia,  HEMATOLOGY: No anemia, easy bruising or bleeding SKIN: No rash or lesion. MUSCULOSKELETAL: No joint pain or arthritis.   NEUROLOGIC: No tingling, numbness, weakness.  PSYCHIATRY: No anxiety or depression.   ROS  DRUG ALLERGIES:  No Known Allergies  VITALS:  Blood pressure (!) 127/96, pulse 98, temperature 99 F (37.2 C), temperature source Axillary, resp. rate (!) 25, height '5\' 7"'$  (1.702 m), weight 71.7 kg (158 lb 1.1 oz), SpO2 100 %.  PHYSICAL EXAMINATION:  GENERAL:  66 y.o.-year-old patient lying in the bed with no acute distress.  EYES: Pupils equal, round, reactive to light and accommodation. No scleral icterus. Extraocular muscles intact.  HEENT: Head atraumatic, normocephalic. Oropharynx and nasopharynx clear.  NECK:  Supple, no jugular venous distention. No thyroid enlargement, no tenderness.  LUNGS: Normal breath sounds bilaterally, no wheezing, some crepitation. No use of accessory muscles  of respiration. On nasal canula oxygen. CARDIOVASCULAR: S1, S2 normal. No murmurs, rubs, or gallops.  ABDOMEN: Soft, nontender, nondistended. Bowel sounds present. No organomegaly or mass.  EXTREMITIES: No pedal edema, cyanosis, or clubbing.  NEUROLOGIC: Cranial nerves II through XII are intact. Muscle strength 5/5 in all extremities. Sensation intact. Gait not checked.  PSYCHIATRIC: The patient is alert and oriented x 3.  SKIN: No obvious rash, lesion, or ulcer.   Physical Exam LABORATORY PANEL:   CBC  Recent Labs Lab 10/31/16 0419  WBC 21.9*  HGB 9.2*  HCT 28.7*  PLT 168   ------------------------------------------------------------------------------------------------------------------  Chemistries   Recent Labs Lab 10/27/16 0426  10/31/16 0419  NA 135  < > 134*  K 4.1  < > 4.6  CL 99*  < > 105  CO2 29  < > 25  GLUCOSE 127*  < > 184*  BUN 17  < > 18  CREATININE 1.03  < > 1.14  CALCIUM 9.4  < > 9.1  MG 2.0  --   --   AST  --   < > 24  ALT  --   < > 38  ALKPHOS  --   < > 51  BILITOT  --   < > 0.7  < > = values in this interval not displayed. ------------------------------------------------------------------------------------------------------------------  Cardiac Enzymes  Recent Labs Lab 10/30/16 1835  TROPONINI <0.03   ------------------------------------------------------------------------------------------------------------------  RADIOLOGY:  Ct Angio Chest Pe W And/or Wo Contrast  Result Date: 10/30/2016 CLINICAL DATA:  66 y/o M; acute chest pain with concern for pulmonary embolus. History of pulmonary malignancy.  EXAM: CT ANGIOGRAPHY CHEST WITH CONTRAST TECHNIQUE: Multidetector CT imaging of the chest was performed using the standard protocol during bolus administration of intravenous contrast. Multiplanar CT image reconstructions and MIPs were obtained to evaluate the vascular anatomy. CONTRAST:  75 cc Isovue 370 COMPARISON:  10/17/2016 CT chest  FINDINGS: Cardiovascular: There is a right suprahilar mass measuring up to 6.9 cm in the right suprahilar region (series 4, image 39), previously 65 mm, invading the right superior pulmonary vein and extending into the right aspect of the left atrium (series 4, image 52). Motion artifact in the lungs. No pulmonary embolus identified. There is narrowing of the right superior and middle lobe pulmonary artery is due to increasing invasion from right suprahilar are pulmonary mass. Normal caliber thoracic aorta with mild calcific atherosclerosis. Normal heart size. No pericardial effusion. Mild coronary artery calcification. Mediastinum/Nodes: Left hilar lymphadenopathy is increased in size from the prior study. Normal trachea, esophagus, and thyroid gland. Lungs/Pleura: Numerous metastasis are present within the lungs bilaterally which are increased in size from the prior CT for example in the left upper lobe there is a nodule measuring 29 x 27 mm, previously 26 x 25 mm (series 6, image 69). Trace residual right pneumothorax. There consolidations within the left upper lobe lingula and left lower lobe likely representing interval development of pneumonia. Upper Abdomen: No acute abnormality. Musculoskeletal: Air within the anterior chest wall is markedly decreased in comparison with the prior CT of the chest with small residual. Soft tissue lung nodule of the right anterior inferior chest wall subcutaneous fat measuring 16 mm is stable (series 4, image 71). Right lateral sixth and seventh mildly displaced acute rib fractures as seen on prior CT of the chest. Review of the MIP images confirms the above findings. IMPRESSION: 1. Moderate respiratory motion artifact. No pulmonary embolus identified. 2. Interval development of consolidations within the left lung base compatible with pneumonia. 3. Interval slight increase in size of right suprahilar mass and metastasis in the lungs bilaterally. 4. The right suprahilar mass  invades the right superior pulmonary vein extending into the left atrium. 5. Trace residual right apical pneumothorax and small diminished volume of air in the anterior chest wall. 6. Stable mildly displaced right lateral sixth and seventh rib fractures. Electronically Signed   By: Kristine Garbe M.D.   On: 10/30/2016 22:07   Dg Chest Portable 1 View  Result Date: 10/30/2016 CLINICAL DATA:  Shortness of Breath EXAM: PORTABLE CHEST 1 VIEW COMPARISON:  10/27/2016.  CT scan 10/17/2016 FINDINGS: Right hilar mass again noted. Bilateral pulmonary metastases are similar prior. Asymmetric elevation right hemidiaphragm unchanged. Cardiopericardial silhouette is at upper limits of normal for size. Nonacute right rib fractures evident. Telemetry leads overlie the chest. IMPRESSION: Stable exam. Right hilar mass with bilateral pulmonary metastases. No new or acute interval findings. Electronically Signed   By: Misty Stanley M.D.   On: 10/30/2016 18:45    ASSESSMENT AND PLAN:   Active Problems:   Acute respiratory failure with hypoxia (HCC)   Atrial fibrillation with rapid ventricular response (HCC)   Malignant neoplasm of lung ([place])   Acute respiratory failure in a patient with known COPD Sepsis likely secondary to hospital-acquired pneumonia bilateral Atrial fibrillation with rapid ventricular response  possible aspiration pneumonia.  - IV antibiotics: Cefepime and vancomycin per pharmacy -Solu-Medrol taper - IV fluid hydration - Continue BiPAP O2, now on nasal canula. - Follow up blood,urine & sputum cultures - appreciated critical care help.  #. History of hypertension -  Hold metoprolol for now secondary to hypotension  #. History of GERD -Continue Protonix and Zantac  #. History of BPH - Continue Flomax  #. History of diabetes - Continue Toujeo -Regular insulin sliding scale coverage every 4 hours  #. History of DVT/PE - Continue Lovenox 70 mg every 12  #. History of  hyperlipidemia - Continue Lipitor  # Lung mass   Pt had received chemo with Dr. Grayland Ormond, now as per CT- it is worse.   Pallaitive care consult.   All the records are reviewed and case discussed with Care Management/Social Workerr. Management plans discussed with the patient, family and they are in agreement.  CODE STATUS: Full.  TOTAL TIME TAKING CARE OF THIS PATIENT: 35 minutes.   Discussed with his wife in room.  POSSIBLE D/C IN 2-3 DAYS, DEPENDING ON CLINICAL CONDITION.   Vaughan Basta M.D on 10/31/2016   Between 7am to 6pm - Pager - [phone]  After 6pm go to [email or web address] - password [email or web address] [facility]  Office  [phone]  CC: Primary care physician; Kathrine Haddock, NP  Note: This dictation was prepared with Dragon dictation along with smaller phrase technology. Any transcriptional errors that result from this process are unintentional.

## 2016-10-31 NOTE — Progress Notes (Signed)
Palliative Medicine consult noted. Due to high referral volume, there will be a delay seeing this patient. A PMT provider will return Monday, April 16, and the patient will be evaluated then. Please call the Palliative Medicine Team office at [phone] if recommendations are needed in the interim.  Thank you for inviting [place] to see this patient.  Marjie Skiff Anastasios Melander, RN, BSN, [facility] 10/31/2016 2:21 PM Cell [phone] 8:00-4:00 Monday-Friday Office [phone]

## 2016-10-31 NOTE — Progress Notes (Signed)
Pt has not voided since admission at midnight, asked pt if he need to void?, pt denies any urge to void, bladder not distended. Pt stated " I only pee when I get in the morning".

## 2016-10-31 NOTE — Evaluation (Signed)
Physical Therapy Evaluation Patient Details Name: Ralph Lopez MRN: [id number] DOB: Feb 17, 1951 Today's Date: 10/31/2016   History of Present Illness  66 y.o. male recently admitted to [facility] 10/15/16 secondary to R-side weakness and aphasia at home. 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 CXR 3/28showed multiple R rib fxs. On 3/29, had R CCA and SCA thromboembolectomy. CXR 3/30 showed R basilar pneumothorax and emphysema with chest tube placement. Pt remained intubated (failed extubation with resp arrest on 3/30)  Extubated 10/22/16. 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 Pt discharged home returns with respiratory failure.  Clinical Impression  Pt did well with PT exam and despite reporting feeling a little weak was able to do all mobility w/o assist, walked 100 ft with walker and generally was safe and confident with all tasks.  He was on 4 liters t/o the session and his sats never appeared to drop below 98%.  Pt with some minimal word finding issues which has been the case since the recent CVA, otherwise pt should be able to go home. He is not at his baseline and would benefit from HHPT though ultimately he did quite well.     Follow Up Recommendations Home health PT    Equipment Recommendations       Recommendations for Other Services       Precautions / Restrictions Precautions Precautions: Fall Restrictions Weight Bearing Restrictions: No      Mobility  Bed Mobility Overal bed mobility: Independent Bed Mobility: Supine to Sit;Sit to Supine     Supine to sit: Modified independent (Device/Increase time) Sit to supine: Modified independent (Device/Increase time)   General bed mobility comments: Pt did well with getting in/out of bed, showed good confidence  Transfers Overall transfer level: Modified  independent Equipment used: Rolling walker (2 wheeled) Transfers: Sit to/from Stand Sit to Stand: Modified independent (Device/Increase time)         General transfer comment: Good confidence and safety  Ambulation/Gait Ambulation/Gait assistance: Supervision Ambulation Distance (Feet): 100 Feet Assistive device: Rolling walker (2 wheeled)       General Gait Details: 4 liters O2 t/o the effort, sats remained in the high 90s the entire time and pt reports only minimal fatigue.  Pt did well with consistent cadence and no LOBs or safety issues.   Stairs            Wheelchair Mobility    Modified Rankin (Stroke Patients Only)       Balance                                             Pertinent Vitals/Pain Pain Assessment: 0-10 Pain Score: 3  (chronic hip/leg pain from mets)    Home Living Family/patient expects to be discharged to:: Private residence Living Arrangements: Spouse/significant other Available Help at Discharge: Family;Available 24 hours/day Type of Home: House Home Access: Stairs to enter Entrance Stairs-Rails: Right Entrance Stairs-Number of Steps: 5   Home Equipment: Walker - 2 wheels;Shower seat;Bedside commode      Prior Function Level of Independence: Independent with assistive device(s)         Comments: Mod indep with intermittent use of RW (doctor recommended for hip metastasis). Pt has not been out of  the house much in the last few months secondary to L>R LE pain secondary to cancer mets     Hand Dominance        Extremity/Trunk Assessment   Upper Extremity Assessment Upper Extremity Assessment: Overall WFL for tasks assessed    Lower Extremity Assessment Lower Extremity Assessment: Overall WFL for tasks assessed       Communication   Communication: No difficulties  Cognition Arousal/Alertness: Awake/alert Behavior During Therapy: WFL for tasks assessed/performed;Restless Overall Cognitive Status:  Within Functional Limits for tasks assessed                                 General Comments: Pt with word finding difficulties but able to converse with therapist.      General Comments      Exercises     Assessment/Plan    PT Assessment Patient needs continued PT services  PT Problem List Decreased strength;Decreased activity tolerance;Decreased balance;Decreased mobility;Decreased knowledge of precautions;Decreased knowledge of use of DME;Cardiopulmonary status limiting activity;Pain       PT Treatment Interventions DME instruction;Gait training;Therapeutic exercise;Balance training;Stair training;Functional mobility training;Therapeutic activities;Patient/family education    PT Goals (Current goals can be found in the Care Plan section)  Acute Rehab PT Goals Patient Stated Goal: get back home PT Goal Formulation: With patient/family Time For Goal Achievement: 11/14/16 Potential to Achieve Goals: Good    Frequency Min 2X/week   Barriers to discharge        Co-evaluation               End of Session Equipment Utilized During Treatment: Gait belt Activity Tolerance: Patient tolerated treatment well Patient left: in bed;with call bell/phone within reach;with family/visitor present Nurse Communication: Mobility status PT Visit Diagnosis: Muscle weakness (generalized) (M62.81);Difficulty in walking, not elsewhere classified (R26.2)    Time: 6047-9987 PT Time Calculation (min) (ACUTE ONLY): 23 min   Charges:   PT Evaluation $PT Eval Low Complexity: 1 Procedure     PT G Codes:        Kreg Shropshire, DPT 10/31/2016, 1:10 PM

## 2016-10-31 NOTE — Progress Notes (Signed)
Cardizem gtt paused, Pt in NSR with rates in 90's.

## 2016-10-31 NOTE — Care Management (Addendum)
RNCM consult received and will follow. Patient appears to have recently been assigned to [place] home health services and I have sent update to [place] with [facility]. Palliative consult is pending as patient discharge planning need may change. Patient is currently getting care through [facility]; they are recommending radiation due to pain in pelvic bone. He lives with his wife that called Ralph Lopez prior to coming to hospital. Per Ralph Lopez nurse patient doesn't appear to understand the potential outcome of his diagnosis. Patient has been going to multiple hospitals for treatment. Hospice was mentioned to patient but he wants aggressive treatment  per [facility]. O2 acute. Palliative consult is pending. Patient is struggling with his diagnosis and it is unclear if patient truly understands the extent of his cancer. PT evaluation pending. He has LTAC benefits if needed.

## 2016-11-01 LAB — BASIC METABOLIC PANEL
ANION GAP: 6 (ref 5–15)
BUN: 20 mg/dL (ref 6–20)
CO2: 27 mmol/L (ref 22–32)
Calcium: 9.4 mg/dL (ref 8.9–10.3)
Chloride: 103 mmol/L (ref 101–111)
Creatinine, Ser: 0.85 mg/dL (ref 0.61–1.24)
Glucose, Bld: 177 mg/dL — ABNORMAL HIGH (ref 65–99)
POTASSIUM: 3.7 mmol/L (ref 3.5–5.1)
SODIUM: 136 mmol/L (ref 135–145)

## 2016-11-01 LAB — CBC
HCT: 27.3 % — ABNORMAL LOW (ref 40.0–52.0)
Hemoglobin: 9 g/dL — ABNORMAL LOW (ref 13.0–18.0)
MCH: 29.3 pg (ref 26.0–34.0)
MCHC: 32.9 g/dL (ref 32.0–36.0)
MCV: 89 fL (ref 80.0–100.0)
Platelets: 159 10*3/uL (ref 150–440)
RBC: 3.07 MIL/uL — AB (ref 4.40–5.90)
RDW: 14.6 % — ABNORMAL HIGH (ref 11.5–14.5)
WBC: 15.4 10*3/uL — AB (ref 3.8–10.6)

## 2016-11-01 LAB — PROCALCITONIN: PROCALCITONIN: 25.12 ng/mL

## 2016-11-01 MED ORDER — CEFEPIME HCL 2 G IJ SOLR
2.0000 g | Freq: Three times a day (TID) | INTRAMUSCULAR | Status: DC
  Administered 2016-11-01 – 2016-11-02 (×3): 2 g via INTRAVENOUS
  Filled 2016-11-01 (×6): qty 2

## 2016-11-01 NOTE — Progress Notes (Addendum)
Pt transferred from ICU to med/surg/oncology. Wife and family with pt. Pt has some late effect of delayed responses, expressive aphasia, occasional deficiency in processing request from previous stroke. New onset of RUE weakness- "can't lift it up"; uses left hand to lift/move right arm. Denies pain/SOB. Noted tublar "whistle" type lung sounds on right with diminished/clear breath sounds. VSS at this time. No sign/symptom bleeding. IVF's continued. Pain controlled with oxycodone.

## 2016-11-01 NOTE — Progress Notes (Addendum)
Patient requesting bipap be applied. Patient and spouse provided education pertaining to bipap at bedtime. Pt states that he does not usually wear this at home but will try tonight. Respiratory therapy notified that patient is ready to apply.

## 2016-11-01 NOTE — Progress Notes (Signed)
Pharmacy Antibiotic Note  Ralph Lopez is a 66 y.o. male admitted on 10/30/2016 with pneumonia.  Pharmacy has been consulted for vancomycin and cefepime dosing.  Plan: Transition to cefepime 2g IV 8 hr for improved renal fxn.   Transition patient to vancomycin 1g IV Q12hr for goal trough of 15-20. Unless otherwise clincailly indicated, will obtain trough prior to am dose on 4/15.     Height: '5\' 7"'$  (170.2 cm) Weight: 158 lb 1.1 oz (71.7 kg) IBW/kg (Calculated) : 66.1  Temp (24hrs), Avg:98 F (36.7 C), Min:97.3 F (36.3 C), Max:98.8 F (37.1 C)   Recent Labs Lab 10/26/16 0317 10/27/16 0426 10/30/16 1835 10/31/16 0017 10/31/16 0419 11/01/16 0658  WBC 9.1 7.5 19.6*  --  21.9* 15.4*  CREATININE  --  1.03 1.20 1.22 1.14 0.85  LATICACIDVEN  --   --   --  2.5* 1.1  --     Estimated Creatinine Clearance: 81 mL/min (by C-G formula based on SCr of 0.85 mg/dL).    No Known Allergies  Antimicrobials this admission: Cefepime 4/12 >>  Vancomycin 4/12 >>   Dose adjustments this admission: 4/13 Vancomycin transitioned to q12hr.   Microbiology results: 4/12 Sputum: sent  4/12 MRSA PCR: negative  Pharmacy will continue to monitor and adjust per consult.   Deionna Marcantonio A 11/01/2016 11:28 AM

## 2016-11-01 NOTE — H&P (Signed)
Patient alert , on room air mid 90's. No complaints of pain. Bi-pap order for night, discontinued tele, using urinal. Pallative care consult ordered. Wife called to inform of patient transfer. Left message. Report called to Opal Sidles.

## 2016-11-01 NOTE — Progress Notes (Signed)
[facility] [place] NAME: Ralph Lopez    MR#:  [id number]  DATE OF BIRTH:  [birth year]  SUBJECTIVE:  CHIEF COMPLAINT:   Chief Complaint  Patient presents with  . Shortness of Breath    Hx of Lung cancer s/p chemoa nd radiation, recent stroke- and MCA revascularization with long hospital stay in [facility]- d/c 4 days ago- had vomiting and then in few hours very SOB- found to have b/l pneumonia and required Bipap initially, off on nasal canula now.  Also had A fib with RVR on presentation, but in NSR and off the drip   Comfortable on room air now, transferred to floor.  REVIEW OF SYSTEMS:  CONSTITUTIONAL: No fever, fatigue or weakness.  EYES: No blurred or double vision.  EARS, NOSE, AND THROAT: No tinnitus or ear pain.  RESPIRATORY: No cough, shortness of breath, wheezing or hemoptysis.  CARDIOVASCULAR: No chest pain, orthopnea, edema.  GASTROINTESTINAL: No nausea, vomiting, diarrhea or abdominal pain.  GENITOURINARY: No dysuria, hematuria.  ENDOCRINE: No polyuria, nocturia,  HEMATOLOGY: No anemia, easy bruising or bleeding SKIN: No rash or lesion. MUSCULOSKELETAL: No joint pain or arthritis.   NEUROLOGIC: No tingling, numbness, weakness.  PSYCHIATRY: No anxiety or depression.   ROS  DRUG ALLERGIES:  No Known Allergies  VITALS:  Blood pressure (!) 145/77, pulse 93, temperature 97.7 F (36.5 C), temperature source Oral, resp. rate 18, height '5\' 7"'$  (1.702 m), weight 71.7 kg (158 lb 1.1 oz), SpO2 97 %.  PHYSICAL EXAMINATION:  GENERAL:  66 y.o.-year-old patient lying in the bed with no acute distress.  EYES: Pupils equal, round, reactive to light and accommodation. No scleral icterus. Extraocular muscles intact.  HEENT: Head atraumatic, normocephalic. Oropharynx and nasopharynx clear.  NECK:  Supple, no jugular venous distention. No thyroid enlargement, no tenderness.  LUNGS: Normal breath sounds bilaterally, no wheezing, some  crepitation. No use of accessory muscles of respiration. On nasal canula oxygen. CARDIOVASCULAR: S1, S2 normal. No murmurs, rubs, or gallops.  ABDOMEN: Soft, nontender, nondistended. Bowel sounds present. No organomegaly or mass.  EXTREMITIES: No pedal edema, cyanosis, or clubbing.  NEUROLOGIC: Cranial nerves II through XII are intact. Muscle strength 5/5 in all extremities. Sensation intact. Gait not checked.  PSYCHIATRIC: The patient is alert and oriented x 3.  SKIN: No obvious rash, lesion, or ulcer.   Physical Exam LABORATORY PANEL:   CBC  Recent Labs Lab 11/01/16 0658  WBC 15.4*  HGB 9.0*  HCT 27.3*  PLT 159   ------------------------------------------------------------------------------------------------------------------  Chemistries   Recent Labs Lab 10/27/16 0426  10/31/16 0419 11/01/16 0658  NA 135  < > 134* 136  K 4.1  < > 4.6 3.7  CL 99*  < > 105 103  CO2 29  < > 25 27  GLUCOSE 127*  < > 184* 177*  BUN 17  < > 18 20  CREATININE 1.03  < > 1.14 0.85  CALCIUM 9.4  < > 9.1 9.4  MG 2.0  --   --   --   AST  --   < > 24  --   ALT  --   < > 38  --   ALKPHOS  --   < > 51  --   BILITOT  --   < > 0.7  --   < > = values in this interval not displayed. ------------------------------------------------------------------------------------------------------------------  Cardiac Enzymes  Recent Labs Lab 10/30/16 [place] <0.03   ------------------------------------------------------------------------------------------------------------------  RADIOLOGY:  Ct Angio Chest Pe W And/or Wo Contrast  Result Date: 10/30/2016 CLINICAL DATA:  66 y/o M; acute chest pain with concern for pulmonary embolus. History of pulmonary malignancy. EXAM: CT ANGIOGRAPHY CHEST WITH CONTRAST TECHNIQUE: Multidetector CT imaging of the chest was performed using the standard protocol during bolus administration of intravenous contrast. Multiplanar CT image reconstructions and MIPs were  obtained to evaluate the vascular anatomy. CONTRAST:  75 cc Isovue 370 COMPARISON:  10/17/2016 CT chest FINDINGS: Cardiovascular: There is a right suprahilar mass measuring up to 6.9 cm in the right suprahilar region (series 4, image 39), previously 65 mm, invading the right superior pulmonary vein and extending into the right aspect of the left atrium (series 4, image 52). Motion artifact in the lungs. No pulmonary embolus identified. There is narrowing of the right superior and middle lobe pulmonary artery is due to increasing invasion from right suprahilar are pulmonary mass. Normal caliber thoracic aorta with mild calcific atherosclerosis. Normal heart size. No pericardial effusion. Mild coronary artery calcification. Mediastinum/Nodes: Left hilar lymphadenopathy is increased in size from the prior study. Normal trachea, esophagus, and thyroid gland. Lungs/Pleura: Numerous metastasis are present within the lungs bilaterally which are increased in size from the prior CT for example in the left upper lobe there is a nodule measuring 29 x 27 mm, previously 26 x 25 mm (series 6, image 69). Trace residual right pneumothorax. There consolidations within the left upper lobe lingula and left lower lobe likely representing interval development of pneumonia. Upper Abdomen: No acute abnormality. Musculoskeletal: Air within the anterior chest wall is markedly decreased in comparison with the prior CT of the chest with small residual. Soft tissue lung nodule of the right anterior inferior chest wall subcutaneous fat measuring 16 mm is stable (series 4, image 71). Right lateral sixth and seventh mildly displaced acute rib fractures as seen on prior CT of the chest. Review of the MIP images confirms the above findings. IMPRESSION: 1. Moderate respiratory motion artifact. No pulmonary embolus identified. 2. Interval development of consolidations within the left lung base compatible with pneumonia. 3. Interval slight increase in  size of right suprahilar mass and metastasis in the lungs bilaterally. 4. The right suprahilar mass invades the right superior pulmonary vein extending into the left atrium. 5. Trace residual right apical pneumothorax and small diminished volume of air in the anterior chest wall. 6. Stable mildly displaced right lateral sixth and seventh rib fractures. Electronically Signed   By: Kristine Garbe M.D.   On: 10/30/2016 22:07   Dg Chest Portable 1 View  Result Date: 10/30/2016 CLINICAL DATA:  Shortness of Breath EXAM: PORTABLE CHEST 1 VIEW COMPARISON:  10/27/2016.  CT scan 10/17/2016 FINDINGS: Right hilar mass again noted. Bilateral pulmonary metastases are similar prior. Asymmetric elevation right hemidiaphragm unchanged. Cardiopericardial silhouette is at upper limits of normal for size. Nonacute right rib fractures evident. Telemetry leads overlie the chest. IMPRESSION: Stable exam. Right hilar mass with bilateral pulmonary metastases. No new or acute interval findings. Electronically Signed   By: Misty Stanley M.D.   On: 10/30/2016 18:45    ASSESSMENT AND PLAN:   Active Problems:   Acute respiratory failure with hypoxia (HCC)   Atrial fibrillation with rapid ventricular response (HCC)   Malignant neoplasm of lung (HCC)   Acute respiratory failure in a patient with known COPD Sepsis secondary to hospital-acquired pneumonia bilateral Atrial fibrillation with rapid ventricular response  possible aspiration pneumonia.  - IV antibiotics: Cefepime and vancomycin per pharmacy -  Solu-Medrol taper - IV fluid hydration - Initially BiPAP O2, then on nasal canula, now on room air. - Follow up blood,urine & sputum cultures - appreciated critical care help.  MRSA PCR negative- Stopped vanc.  #. History of hypertension -Hold metoprolol for now secondary to hypotension   Resumed as BP normal now.  #. History of GERD -Continue Protonix and Zantac  #. History of BPH - Continue  Flomax  #. History of diabetes - Continue Toujeo -Regular insulin sliding scale coverage every 4 hours  #. History of DVT/PE - Continue Lovenox 70 mg every 12  #. History of hyperlipidemia - Continue Lipitor  # Lung mass- extending to Pulm vein and left atreum   Pt had received chemo with Dr. Grayland Ormond, now as per CT- it is worse.   Pallaitive care consult.   All the records are reviewed and case discussed with Care Management/Social Workerr. Management plans discussed with the patient, family and they are in agreement.  CODE STATUS: Full.  TOTAL TIME TAKING CARE OF THIS PATIENT: 35 minutes.   Discussed with his wife in room.  POSSIBLE D/C IN 1-2 DAYS, DEPENDING ON CLINICAL CONDITION.   Vaughan Basta M.D on 11/01/2016   Between 7am to 6pm - Pager - [phone]  After 6pm go to [email or web address] - password [email or web address] [facility]  Office  [phone]  CC: Primary care physician; Kathrine Haddock, NP  Note: This dictation was prepared with Dragon dictation along with smaller phrase technology. Any transcriptional errors that result from this process are unintentional.

## 2016-11-01 NOTE — Progress Notes (Addendum)
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 Weaned off of noninvasive ventilation. Respiratory status has improved. Leukocytosis decreased to 15. Patient is presently on Maxipime, vancomycin, Solu-Medrol, albuterol, Atrovent. Patient is a full code per patient and family's request  Anemia. No evidence of active bleeding, stable  Hyperglycemia. On insulin with sliding scale coverage  Lactic acidosis. Resolved  At this point patient is stable for floor transfer. Patient is presently on room air with oxygen saturation in the mid 90s, hemodynamically stable, voices no complaints, telemetry DC'd, we'll sign off. If can be of any assistance please do not hesitate to reconsult. Pending palliative care input   Name: Ralph Lopez MRN: [id number] DOB: 1951/07/13    ADMISSION DATE:  10/30/2016   Subjective: Patient states he is feeling somewhat better, still is requiring noninvasive ventilation, has not been tried off of NIV  PAST MEDICAL HISTORY :  Past Medical History:  Diagnosis Date  . Anxiety   . Cancer ([place])    Stage IV Lung  . Chronic kidney disease   . COPD (chronic obstructive pulmonary disease) ([place])   . Depression   . Diabetes mellitus without complication ([place])   . Hyperlipidemia   . Hypertension    Past Surgical History:  Procedure Laterality Date  . CAROTID-SUBCLAVIAN BYPASS GRAFT Right 10/16/2016   Procedure: RIGHT CAROTID-SUBCLAVIAN THROMBECTOMY;  Surgeon: Waynetta Sandy, MD;  Location: [facility];  Service: Vascular;  Laterality: Right;  . EYE SURGERY    . FLEXIBLE BRONCHOSCOPY N/A 04/28/2016   Procedure: FLEXIBLE BRONCHOSCOPY;  Surgeon: [name], MD;  Location: [facility];  Service: Pulmonary;  Laterality: N/A;  . IR GENERIC HISTORICAL  10/15/2016   IR PERCUTANEOUS  ART THROMBECTOMY/INFUSION INTRACRANIAL INC DIAG ANGIO 10/15/2016 Luanne Bras, MD [facility]  . IR GENERIC HISTORICAL  10/15/2016   IR ANGIOGRAM EXTREMITY RIGHT 10/15/2016 Luanne Bras, MD [facility]  . IR GENERIC HISTORICAL  10/15/2016   IR ANGIO INTRA EXTRACRAN SEL COM CAROTID INNOMINATE UNI R MOD SED 10/15/2016 Luanne Bras, MD [facility]  . MANDIBLE SURGERY    . RADIOLOGY WITH ANESTHESIA N/A 10/15/2016   Procedure: RADIOLOGY WITH ANESTHESIA;  Surgeon: Luanne Bras, MD;  Location: [facility];  Service: Radiology;  Laterality: N/A;   Prior to Admission medications   Medication Sig Start Date End Date Taking? Authorizing Provider  albuterol (PROVENTIL HFA;VENTOLIN HFA) 108 (90 Base) MCG/ACT inhaler Inhale 2 puffs into the lungs every 6 (six) hours as needed for wheezing or shortness of breath.   Yes Historical Provider, MD  atorvastatin (LIPITOR) 10 MG tablet Take 10 mg by mouth daily.   Yes Historical Provider, MD  BYDUREON 2 MG PEN INJECT '2MG'$  SUBCUTANEOUSLY WEEKLY 10/29/16  Yes Kathrine Haddock, NP  enoxaparin (LOVENOX) 80 MG/0.8ML injection Inject 0.7 mLs (70 mg total) into the skin every 12 (twelve) hours. 10/27/16  Yes Donzetta Starch, NP  feeding supplement, ENSURE ENLIVE, (ENSURE ENLIVE) LIQD Take 237 mLs by mouth 2 (two) times daily between meals. 10/28/16  Yes Donzetta Starch, NP  Insulin Glargine (TOUJEO SOLOSTAR) 300 UNIT/ML SOPN Inject 30 Units as directed daily. 06/02/16  Yes Kathrine Haddock, NP  ipratropium-albuterol (DUONEB) 0.5-2.5 (3) MG/3ML SOLN Take 3 mLs by nebulization 3 (three) times daily. 10/27/16  Yes Donzetta Starch, NP  LORazepam (ATIVAN) 0.5 MG tablet TAKE 1 TABLET  BY MOUTH TWICE DAILY 10/28/16  Yes Kathrine Haddock, NP  metoprolol tartrate (LOPRESSOR) 25 MG tablet Take 1 tablet (25 mg total) by mouth 3 (three) times daily. 10/27/16  Yes Donzetta Starch, NP  ondansetron (ZOFRAN) 8 MG tablet Take 1 tablet by mouth 2 (two) times daily as needed. 05/09/16  Yes Historical  Provider, MD  oxyCODONE (OXYCONTIN) 10 mg 12 hr tablet Take 1 tablet (10 mg total) by mouth every 12 (twelve) hours. 10/30/16  Yes Sindy Guadeloupe, MD  Oxycodone HCl 10 MG TABS Take 0.5-1 tablets (5-10 mg total) by mouth every 4 (four) hours as needed. 10/27/16  Yes Donzetta Starch, NP  pantoprazole (PROTONIX) 40 MG tablet Take 1 tablet (40 mg total) by mouth daily. 05/05/16  Yes Kathrine Haddock, NP  predniSONE (DELTASONE) 10 MG tablet Take 4 tablets (40 mg total) by mouth daily with breakfast. Taper dose: '40mg'$  x 2 days, 30 mg x 2 days, '20mg'$  x 2 days, 10 mg x 2 days, 5 mg (1/2 tab) x 2 days then stop 10/28/16  Yes Donzetta Starch, NP  ranitidine (ZANTAC) 150 MG tablet Take 150 mg by mouth daily as needed for heartburn.    Yes Historical Provider, MD  tamsulosin (FLOMAX) 0.4 MG CAPS capsule Take 1 capsule (0.4 mg total) by mouth daily. 10/28/16  Yes Donzetta Starch, NP   No Known Allergies  FAMILY HISTORY:  Family History  Problem Relation Age of Onset  . Diabetes Mother   . Hypertension Mother   . Breast cancer Mother   . Thrombosis Mother   . Heart disease Father     MI  . Breast cancer Sister   . Colon cancer Brother   . Lung cancer Brother    SOCIAL HISTORY:  reports that he quit smoking about 2 years ago. He quit after 20.00 years of use. He has never used smokeless tobacco. He reports that he does not drink alcohol or use drugs.   VITAL SIGNS: Temp:  [98 F (36.7 C)-99 F (37.2 C)] 98.8 F (37.1 C) (04/13 1940) Pulse Rate:  [94-116] 96 (04/14 0400) Resp:  [17-34] 34 (04/14 0400) BP: (104-169)/(70-114) 147/112 (04/14 0400) SpO2:  [100 %] 100 % (04/14 0400) HEMODYNAMICS:   VENTILATOR SETTINGS:   INTAKE / OUTPUT:  Intake/Output Summary (Last 24 hours) at 11/01/16 0759 Last data filed at 11/01/16 0600  Gross per 24 hour  Intake             1925 ml  Output             2575 ml  Net             -650 ml    Physical Examination:   VS: BP (!) 147/112   Pulse 96   Temp 98.8 F (37.1  C) (Axillary)   Resp (!) 34   Ht '5\' 7"'$  (1.702 m)   Wt 71.7 kg (158 lb 1.1 oz)   SpO2 100%   BMI 24.76 kg/m   General Appearance: No distress  Neuro:without focal findings, mental status, speech normal,. HEENT: PERRLA, EOM intact, no ptosis, no other lesions noticed;  Pulmonary: Markedly diminished breath sounds bilaterally Cardiovascular: Distant heart sounds, tachycardic at 105.    Abdomen: Benign, Soft, non-tender, No masses, hepatosplenomegaly, No lymphadenopathy Skin:   warm, no rashes, no ecchymosis  Musculoskeletal: normal, no cyanosis, clubbing, no edema, warm with normal capillary refill.    LABS: Reviewed   LABORATORY PANEL:   CBC  Recent  Labs Lab 11/01/16 0658  WBC 15.4*  HGB 9.0*  HCT 27.3*  PLT 159    Chemistries   Recent Labs Lab 10/27/16 0426  10/31/16 0419 11/01/16 0658  NA 135  < > 134* 136  K 4.1  < > 4.6 3.7  CL 99*  < > 105 103  CO2 29  < > 25 27  GLUCOSE 127*  < > 184* 177*  BUN 17  < > 18 20  CREATININE 1.03  < > 1.14 0.85  CALCIUM 9.4  < > 9.1 9.4  MG 2.0  --   --   --   AST  --   < > 24  --   ALT  --   < > 38  --   ALKPHOS  --   < > 51  --   BILITOT  --   < > 0.7  --   < > = values in this interval not displayed.   Recent Labs Lab 10/26/16 1140 10/26/16 1610 10/26/16 2145 10/27/16 0631 10/27/16 1138 10/30/16 2358  GLUCAP 191* 236* 225* 108* 142* 183*    Recent Labs Lab 10/30/16 1850  PHART 7.36  PCO2ART 47  PO2ART 197*    Recent Labs Lab 10/31/16 0017 10/31/16 0419  AST 31 24  ALT 40 38  ALKPHOS 55 51  BILITOT 0.6 0.7  ALBUMIN 2.4* 2.3*    Cardiac Enzymes  Recent Labs Lab 10/30/16 1835  TROPONINI <0.03    RADIOLOGY:  Ct Angio Chest Pe W And/or Wo Contrast  Result Date: 10/30/2016 CLINICAL DATA:  66 y/o M; acute chest pain with concern for pulmonary embolus. History of pulmonary malignancy. EXAM: CT ANGIOGRAPHY CHEST WITH CONTRAST TECHNIQUE: Multidetector CT imaging of the chest was performed  using the standard protocol during bolus administration of intravenous contrast. Multiplanar CT image reconstructions and MIPs were obtained to evaluate the vascular anatomy. CONTRAST:  75 cc Isovue 370 COMPARISON:  10/17/2016 CT chest FINDINGS: Cardiovascular: There is a right suprahilar mass measuring up to 6.9 cm in the right suprahilar region (series 4, image 39), previously 65 mm, invading the right superior pulmonary vein and extending into the right aspect of the left atrium (series 4, image 52). Motion artifact in the lungs. No pulmonary embolus identified. There is narrowing of the right superior and middle lobe pulmonary artery is due to increasing invasion from right suprahilar are pulmonary mass. Normal caliber thoracic aorta with mild calcific atherosclerosis. Normal heart size. No pericardial effusion. Mild coronary artery calcification. Mediastinum/Nodes: Left hilar lymphadenopathy is increased in size from the prior study. Normal trachea, esophagus, and thyroid gland. Lungs/Pleura: Numerous metastasis are present within the lungs bilaterally which are increased in size from the prior CT for example in the left upper lobe there is a nodule measuring 29 x 27 mm, previously 26 x 25 mm (series 6, image 69). Trace residual right pneumothorax. There consolidations within the left upper lobe lingula and left lower lobe likely representing interval development of pneumonia. Upper Abdomen: No acute abnormality. Musculoskeletal: Air within the anterior chest wall is markedly decreased in comparison with the prior CT of the chest with small residual. Soft tissue lung nodule of the right anterior inferior chest wall subcutaneous fat measuring 16 mm is stable (series 4, image 71). Right lateral sixth and seventh mildly displaced acute rib fractures as seen on prior CT of the chest. Review of the MIP images confirms the above findings. IMPRESSION: 1. Moderate respiratory motion artifact. No pulmonary embolus  identified. 2. Interval development of consolidations within the left lung base compatible with pneumonia. 3. Interval slight increase in size of right suprahilar mass and metastasis in the lungs bilaterally. 4. The right suprahilar mass invades the right superior pulmonary vein extending into the left atrium. 5. Trace residual right apical pneumothorax and small diminished volume of air in the anterior chest wall. 6. Stable mildly displaced right lateral sixth and seventh rib fractures. Electronically Signed   By: Kristine Garbe M.D.   On: 10/30/2016 22:07   Dg Chest Portable 1 View  Result Date: 10/30/2016 CLINICAL DATA:  Shortness of Breath EXAM: PORTABLE CHEST 1 VIEW COMPARISON:  10/27/2016.  CT scan 10/17/2016 FINDINGS: Right hilar mass again noted. Bilateral pulmonary metastases are similar prior. Asymmetric elevation right hemidiaphragm unchanged. Cardiopericardial silhouette is at upper limits of normal for size. Nonacute right rib fractures evident. Telemetry leads overlie the chest. IMPRESSION: Stable exam. Right hilar mass with bilateral pulmonary metastases. No new or acute interval findings. Electronically Signed   By: Misty Stanley M.D.   On: 10/30/2016 18:45       Hermelinda Dellen, DO Board Certified in Internal Medicine, Pulmonary Medicine, [place], and Sleep Medicine.  ICU Pager [phone] [facility] Office Number: [phone]  Patricia Pesa, M.D.  Merton Border, M.D   11/01/2016, 7:59 AM

## 2016-11-02 ENCOUNTER — Encounter: Payer: Self-pay | Admitting: Radiology

## 2016-11-02 ENCOUNTER — Inpatient Hospital Stay: Payer: BLUE CROSS/BLUE SHIELD

## 2016-11-02 DIAGNOSIS — C7951 Secondary malignant neoplasm of bone: Secondary | ICD-10-CM

## 2016-11-02 DIAGNOSIS — N189 Chronic kidney disease, unspecified: Secondary | ICD-10-CM

## 2016-11-02 DIAGNOSIS — C3411 Malignant neoplasm of upper lobe, right bronchus or lung: Secondary | ICD-10-CM

## 2016-11-02 DIAGNOSIS — R0602 Shortness of breath: Secondary | ICD-10-CM

## 2016-11-02 LAB — VANCOMYCIN, TROUGH: Vancomycin Tr: 13 ug/mL — ABNORMAL LOW (ref 15–20)

## 2016-11-02 LAB — CBC
HEMATOCRIT: 28 % — AB (ref 40.0–52.0)
Hemoglobin: 9.6 g/dL — ABNORMAL LOW (ref 13.0–18.0)
MCH: 30 pg (ref 26.0–34.0)
MCHC: 34.2 g/dL (ref 32.0–36.0)
MCV: 87.7 fL (ref 80.0–100.0)
PLATELETS: 168 10*3/uL (ref 150–440)
RBC: 3.2 MIL/uL — ABNORMAL LOW (ref 4.40–5.90)
RDW: 14.9 % — AB (ref 11.5–14.5)
WBC: 12.2 10*3/uL — AB (ref 3.8–10.6)

## 2016-11-02 LAB — BASIC METABOLIC PANEL
ANION GAP: 8 (ref 5–15)
BUN: 22 mg/dL — ABNORMAL HIGH (ref 6–20)
CALCIUM: 9.5 mg/dL (ref 8.9–10.3)
CO2: 26 mmol/L (ref 22–32)
CREATININE: 0.84 mg/dL (ref 0.61–1.24)
Chloride: 102 mmol/L (ref 101–111)
GLUCOSE: 167 mg/dL — AB (ref 65–99)
Potassium: 3.3 mmol/L — ABNORMAL LOW (ref 3.5–5.1)
Sodium: 136 mmol/L (ref 135–145)

## 2016-11-02 LAB — PROCALCITONIN: PROCALCITONIN: 12.92 ng/mL

## 2016-11-02 LAB — GLUCOSE, CAPILLARY: Glucose-Capillary: 233 mg/dL — ABNORMAL HIGH (ref 65–99)

## 2016-11-02 MED ORDER — AMLODIPINE BESYLATE 5 MG PO TABS: 5.0000 mg | ORAL_TABLET | Freq: Every day | ORAL | 0 refills | Status: AC

## 2016-11-02 MED ORDER — IOPAMIDOL (ISOVUE-300) INJECTION 61%
75.0000 mL | Freq: Once | INTRAVENOUS | Status: AC | PRN
  Administered 2016-11-02: 75 mL via INTRAVENOUS

## 2016-11-02 MED ORDER — AMLODIPINE BESYLATE 5 MG PO TABS
5.0000 mg | ORAL_TABLET | Freq: Every day | ORAL | Status: DC
  Administered 2016-11-02: 10:00:00 5 mg via ORAL
  Filled 2016-11-02: qty 1

## 2016-11-02 MED ORDER — PREDNISONE 50 MG PO TABS
50.0000 mg | ORAL_TABLET | Freq: Every day | ORAL | Status: DC
  Administered 2016-11-02: 08:00:00 50 mg via ORAL
  Filled 2016-11-02: qty 1

## 2016-11-02 MED ORDER — POTASSIUM CHLORIDE CRYS ER 20 MEQ PO TBCR
40.0000 meq | EXTENDED_RELEASE_TABLET | Freq: Once | ORAL | Status: AC
Start: 2016-11-02 — End: 2016-11-02
  Administered 2016-11-02: 10:00:00 40 meq via ORAL
  Filled 2016-11-02: qty 2

## 2016-11-02 MED ORDER — AMOXICILLIN-POT CLAVULANATE 875-125 MG PO TABS: 1.0000 | ORAL_TABLET | Freq: Two times a day (BID) | ORAL | 0 refills | Status: AC

## 2016-11-02 MED ORDER — AMOXICILLIN-POT CLAVULANATE 875-125 MG PO TABS
1.0000 | ORAL_TABLET | Freq: Once | ORAL | Status: AC
  Administered 2016-11-02: 18:00:00 1 via ORAL
  Filled 2016-11-02: qty 1

## 2016-11-02 NOTE — Progress Notes (Signed)
Patient called out and states that bipap is blowing too hard. Pt states that he doesn't normally wear this and would like to remove. Wife is at the bedside and states that he does not wear oxygen or bipap at home. Respiratory notified that patient requested to remove bipap and bipap was removed.

## 2016-11-02 NOTE — Progress Notes (Signed)
Pharmacy Antibiotic Note  Ralph Lopez is a 66 y.o. male admitted on 10/30/2016 with pneumonia.  Pharmacy has been consulted for vancomycin and cefepime dosing.  Vancomycin discontinued 4/14.  Plan: Day 4-  cefepime 2g IV 8 hr     Height: '5\' 7"'$  (170.2 cm) Weight: 158 lb 1.1 oz (71.7 kg) IBW/kg (Calculated) : 66.1  Temp (24hrs), Avg:97.7 F (36.5 C), Min:97.4 F (36.3 C), Max:98.1 F (36.7 C)   Recent Labs Lab 10/27/16 0426 10/30/16 1835 10/31/16 0017 10/31/16 0419 11/01/16 0658 11/02/16 0451  WBC 7.5 19.6*  --  21.9* 15.4* 12.2*  CREATININE 1.03 1.20 1.22 1.14 0.85 0.84  LATICACIDVEN  --   --  2.5* 1.1  --   --     Estimated Creatinine Clearance: 82 mL/min (by C-G formula based on SCr of 0.84 mg/dL).    No Known Allergies  Antimicrobials this admission: Cefepime 4/12 >>  Vancomycin 4/12 >> 4/14  Dose adjustments this admission: 4/13 Vancomycin transitioned to q12hr.   Microbiology results: 4/12 Sputum: sent  4/12 MRSA PCR: negative  Pharmacy will continue to monitor and adjust per consult.   Ralph Lopez A 11/02/2016 9:42 AM

## 2016-11-02 NOTE — Consult Note (Signed)
[facility] Consult Note  MRN : [id number]  Ralph Lopez is a 66 y.o. (21-Nov-1950) male who presents with chief complaint of  Chief Complaint  Patient presents with  . Shortness of Breath   History of Present Illness:  The patient is a 66 year old male with a past medical history of lung cancer (mets to bone), COPD, CCKD DM, HTN, HLD s/p chest tube with removal of chest tube this past Monday (at [facility])?Marland Kitchen He presented to our ED on 10/30/16 in respiratory distress. Recent hospitalization at [facility] for stroke s/p revascularization with respiratory failure. Admitted to [facility] for possible pneumonia related sepsis and Afib.    Patient with swelling located on right neck from "masses" removed during [facility] hospitalization. Denies any pain from site or SOB. No fever, nausea or vomiting.   Consulted by primary team Dr. Anselm Jungling for recommendation in regard to operative intervention and if OK to start Lovenox.   Current Facility-Administered Medications  Medication Dose Route Frequency Provider Last Rate Last Dose  . acetaminophen (TYLENOL) tablet 650 mg  650 mg Oral Q6H PRN Alexis Hugelmeyer, DO       Or  . acetaminophen (TYLENOL) suppository 650 mg  650 mg Rectal Q6H PRN Alexis Hugelmeyer, DO      . albuterol (PROVENTIL) (2.5 MG/3ML) 0.083% nebulizer solution 2.5 mg  2.5 mg Nebulization Q6H PRN Alexis Hugelmeyer, DO      . amLODipine (NORVASC) tablet 5 mg  5 mg Oral Daily Vaughan Basta, MD   5 mg at 11/02/16 1000  . atorvastatin (LIPITOR) tablet 10 mg  10 mg Oral Daily Alexis Hugelmeyer, DO   10 mg at 11/01/16 1747  . bisacodyl (DULCOLAX) EC tablet 5 mg  5 mg Oral Daily PRN Alexis Hugelmeyer, DO      . ceFEPIme (MAXIPIME) 2 g in dextrose 5 % 50 mL IVPB  2 g Intravenous Q8H Alexis Hugelmeyer, DO   2 g at 11/02/16 0956  . chlorhexidine (PERIDEX) 0.12 % solution 15 mL  15 mL Mouth Rinse BID Bincy S Varughese, NP   15 mL at 11/02/16 1000  .  enoxaparin (LOVENOX) injection 70 mg  1 mg/kg Subcutaneous Q12H Alexis Hugelmeyer, DO   Stopped at 11/02/16 1443  . famotidine (PEPCID) tablet 20 mg  20 mg Oral Daily PRN Alexis Hugelmeyer, DO      . insulin glargine (LANTUS) injection 30 Units  30 Units Subcutaneous Daily Alexis Hugelmeyer, DO   30 Units at 11/02/16 0804  . ipratropium (ATROVENT) nebulizer solution 0.5 mg  0.5 mg Nebulization Q6H PRN Alexis Hugelmeyer, DO      . magnesium citrate solution 1 Bottle  1 Bottle Oral Once PRN Alexis Hugelmeyer, DO      . MEDLINE mouth rinse  15 mL Mouth Rinse q12n4p Bincy S Varughese, NP   15 mL at 11/01/16 2155  . metoprolol tartrate (LOPRESSOR) tablet 25 mg  25 mg Oral TID Bincy S Varughese, NP   25 mg at 11/02/16 1000  . ondansetron (ZOFRAN) tablet 4 mg  4 mg Oral Q6H PRN Alexis Hugelmeyer, DO       Or  . ondansetron (ZOFRAN) injection 4 mg  4 mg Intravenous Q6H PRN Alexis Hugelmeyer, DO      . ondansetron (ZOFRAN) tablet 8 mg  8 mg Oral BID PRN Alexis Hugelmeyer, DO      . oxyCODONE (Oxy IR/ROXICODONE) immediate release tablet 5-10 mg  5-10 mg Oral Q4H PRN Ubaldo Glassing  Hugelmeyer, DO   10 mg at 11/02/16 0205  . oxyCODONE (OXYCONTIN) 12 hr tablet 10 mg  10 mg Oral Q12H Alexis Hugelmeyer, DO   10 mg at 11/02/16 1000  . pantoprazole (PROTONIX) EC tablet 40 mg  40 mg Oral Daily Alexis Hugelmeyer, DO   40 mg at 11/02/16 0804  . predniSONE (DELTASONE) tablet 50 mg  50 mg Oral Q breakfast Vaughan Basta, MD   50 mg at 11/02/16 0804  . senna-docusate (Senokot-S) tablet 1 tablet  1 tablet Oral QHS PRN Alexis Hugelmeyer, DO      . sodium chloride flush (NS) 0.9 % injection 3 mL  3 mL Intravenous Q12H Alexis Hugelmeyer, DO   3 mL at 11/02/16 1001  . tamsulosin (FLOMAX) capsule 0.4 mg  0.4 mg Oral Daily Alexis Hugelmeyer, DO   0.4 mg at 11/02/16 7893   Past Medical History:  Diagnosis Date  . Anxiety   . Cancer ([place])    Stage IV Lung  . Chronic kidney disease   . COPD (chronic obstructive pulmonary  disease) ([place])   . Depression   . Diabetes mellitus without complication ([place])   . Hyperlipidemia   . Hypertension    Past Surgical History:  Procedure Laterality Date  . CAROTID-SUBCLAVIAN BYPASS GRAFT Right 10/16/2016   Procedure: RIGHT CAROTID-SUBCLAVIAN THROMBECTOMY;  Surgeon: Waynetta Sandy, MD;  Location: [facility];  Service: Vascular;  Laterality: Right;  . EYE SURGERY    . FLEXIBLE BRONCHOSCOPY N/A 04/28/2016   Procedure: FLEXIBLE BRONCHOSCOPY;  Surgeon: [name], MD;  Location: [facility];  Service: Pulmonary;  Laterality: N/A;  . IR GENERIC HISTORICAL  10/15/2016   IR PERCUTANEOUS ART THROMBECTOMY/INFUSION INTRACRANIAL INC DIAG ANGIO 10/15/2016 Luanne Bras, MD [facility]  . IR GENERIC HISTORICAL  10/15/2016   IR ANGIOGRAM EXTREMITY RIGHT 10/15/2016 Luanne Bras, MD [facility]  . IR GENERIC HISTORICAL  10/15/2016   IR ANGIO INTRA EXTRACRAN SEL COM CAROTID INNOMINATE UNI R MOD SED 10/15/2016 Luanne Bras, MD [facility]  . MANDIBLE SURGERY    . RADIOLOGY WITH ANESTHESIA N/A 10/15/2016   Procedure: RADIOLOGY WITH ANESTHESIA;  Surgeon: Luanne Bras, MD;  Location: [facility];  Service: Radiology;  Laterality: N/A;   Social History Social History  Substance Use Topics  . Smoking status: Former Smoker    Years: 20.00    Quit date: 04/22/2014  . Smokeless tobacco: Never Used  . Alcohol use No   Family History Family History  Problem Relation Age of Onset  . Diabetes Mother   . Hypertension Mother   . Breast cancer Mother   . Thrombosis Mother   . Heart disease Father     MI  . Breast cancer Sister   . Colon cancer Brother   . Lung cancer Brother   Denies family history of PAD, Renal or venous disease.   No Known Allergies  REVIEW OF SYSTEMS (Negative unless checked)  Constitutional: '[]'$ Weight loss  '[]'$ Fever  '[]'$ Chills Cardiac: '[]'$ Chest pain   '[]'$ Chest pressure   '[]'$ Palpitations   '[]'$ Shortness of breath when laying flat   '[]'$ Shortness of breath at  rest   '[]'$ Shortness of breath with exertion. Vascular:  '[]'$ Pain in legs with walking   '[]'$ Pain in legs at rest   '[]'$ Pain in legs when laying flat   '[]'$ Claudication   '[]'$ Pain in feet when walking  '[]'$ Pain in feet at rest  '[]'$ Pain in feet when laying flat   '[]'$ History of DVT   '[]'$ Phlebitis   '[]'$ Swelling in legs   '[]'$   Varicose veins   '[]'$ Non-healing ulcers Pulmonary:   '[x]'$ Uses home oxygen   '[]'$ Productive cough   '[]'$ Hemoptysis   '[]'$ Wheeze  '[x]'$ COPD   '[]'$ Asthma Neurologic:  '[]'$ Dizziness  '[]'$ Blackouts   '[]'$ Seizures   '[x]'$ History of stroke   '[x]'$ History of TIA  '[]'$ Aphasia   '[]'$ Temporary blindness   '[]'$ Dysphagia   '[]'$ Weakness or numbness in arms   '[]'$ Weakness or numbness in legs Musculoskeletal:  '[]'$ Arthritis   '[]'$ Joint swelling   '[]'$ Joint pain   '[]'$ Low back pain Hematologic:  '[]'$ Easy bruising  '[]'$ Easy bleeding   '[]'$ Hypercoagulable state   '[]'$ Anemic  '[]'$ Hepatitis Gastrointestinal:  '[]'$ Blood in stool   '[]'$ Vomiting blood  '[]'$ Gastroesophageal reflux/heartburn   '[]'$ Difficulty swallowing. Genitourinary:  '[]'$ Chronic kidney disease   '[]'$ Difficult urination  '[]'$ Frequent urination  '[]'$ Burning with urination   '[]'$ Blood in urine Skin:  '[]'$ Rashes   '[]'$ Ulcers   '[]'$ Wounds Psychological:  '[]'$ History of anxiety   '[]'$  History of major depression.  Physical Examination  Vitals:   11/02/16 0408 11/02/16 0435 11/02/16 0953 11/02/16 1309  BP: (!) 177/89 (!) 163/94 (!) 171/90 (!) 179/94  Pulse: 85 86 (!) 106 96  Resp:   18   Temp:   97.7 F (36.5 C) 97.9 F (36.6 C)  TempSrc:   Oral Oral  SpO2:   97% 98%  Weight:      Height:       Body mass index is 24.76 kg/m. Gen:  WD/WN, NAD Head: [place]/AT, No temporalis wasting. Prominent temp pulse not noted. Ear/Nose/Throat: Hearing grossly intact, nares w/o erythema or drainage, oropharynx w/o Erythema/Exudate Eyes: Sclera non-icteric, conjunctiva clear Neck: Trachea midline.  No JVD. Right hematoma noted. Soft. Not near trachea. Incision intake with dermabond. No signs of infection noted.  Pulmonary:  Good air movement,  respirations not labored, equal bilaterally.  Cardiac: RRR, normal S1, S2. Vascular:  Vessel Right Left  Radial Palpable Palpable  Ulnar Palpable Palpable  Brachial Palpable Palpable  Carotid Palpable, without bruit Palpable, without bruit  Aorta Not palpable N/A  Femoral Palpable Palpable  Popliteal Palpable Palpable  PT Palpable Palpable  DP Palpable Palpable   Gastrointestinal: soft, non-tender/non-distended. No guarding/reflex.  Musculoskeletal: M/S 5/5 throughout.  Extremities without ischemic changes.  No deformity or atrophy. No edema. Neurologic: Sensation grossly intact in extremities.   Psychiatric: Judgment intact, Mood & affect appropriate for pt's clinical situation. Dermatologic: No rashes or ulcers noted.  No cellulitis or open wounds. Lymph : No Cervical, Axillary, or Inguinal lymphadenopathy.  CBC Lab Results  Component Value Date   WBC 12.2 (H) 11/02/2016   HGB 9.6 (L) 11/02/2016   HCT 28.0 (L) 11/02/2016   MCV 87.7 11/02/2016   PLT 168 11/02/2016   BMET    Component Value Date/Time   NA 136 11/02/2016 0451   NA 141 07/07/2016 0950   NA 139 04/25/2014 0407   K 3.3 (L) 11/02/2016 0451   K 3.6 04/25/2014 0407   CL 102 11/02/2016 0451   CL 111 (H) 04/25/2014 0407   CO2 26 11/02/2016 0451   CO2 24 04/25/2014 0407   GLUCOSE 167 (H) 11/02/2016 0451   GLUCOSE 177 (H) 04/25/2014 0407   BUN 22 (H) 11/02/2016 0451   BUN 21 07/07/2016 0950   BUN 29 (H) 04/25/2014 0407   CREATININE 0.84 11/02/2016 0451   CREATININE 1.50 (H) 04/25/2014 0407   CALCIUM 9.5 11/02/2016 0451   CALCIUM 8.4 (L) 04/25/2014 0407   GFRNONAA >60 11/02/2016 0451   GFRNONAA 43 (L) 04/24/2014 0052   GFRAA >60 11/02/2016 0451  GFRAA 52 (L) 04/24/2014 0052   Estimated Creatinine Clearance: 82 mL/min (by C-G formula based on SCr of 0.84 mg/dL).  COAG Lab Results  Component Value Date   INR 1.16 10/31/2016   INR 1.31 10/24/2016   INR 1.12 10/23/2016   Radiology Ct Angio Head W Or  Wo Contrast  Result Date: 10/15/2016 CLINICAL DATA:  Acute onset right-sided weakness and abnormal speech. EXAM: CT ANGIOGRAPHY HEAD AND NECK TECHNIQUE: Multidetector CT imaging of the head and neck was performed using the standard protocol during bolus administration of intravenous contrast. Multiplanar CT image reconstructions and MIPs were obtained to evaluate the vascular anatomy. Carotid stenosis measurements (when applicable) are obtained utilizing NASCET criteria, using the distal internal carotid diameter as the denominator. CONTRAST:  90 mL Isovue 370 COMPARISON:  MRI brain 05/14/2016 FINDINGS: CTA NECK FINDINGS Aortic arch: Atherosclerotic calcifications are present at the aortic arch without compromise of the great vessel origins. There is no aneurysm. Right carotid system: The a saddle embolus is present at bifurcation of the innominate artery and to the right common carotid artery and subclavian artery. This is nonocclusive. There is some nonocclusive thrombus extending more superiorly within the right common carotid artery is well. Atherosclerotic changes are present at the right carotid bifurcation without significant stenosis. Left carotid system: The left common carotid artery is within normal limits. Atherosclerotic changes are present at left carotid bifurcation. There is calcified plaque proximally. Noncalcified posterior plaque more distally narrows the with 3 mm. This compares with a distal measurement of 4.5 mm. Posterior calcification just below the skullbase may reflect a remote injury. There is no associated stenosis. Vertebral arteries: The vertebral arteries both originate from the subclavian arteries. There is no focal stenosis of the origin. The left vertebral artery the dominant vessel. There is no significant stenosis cannot air vertebral artery in the. Dense atherosclerotic calcifications narrowing of the left vertebral artery limiting to 1.5 mm. Skeleton: Vertebral body heights  and alignment are normal. There is no focal lytic or blastic lesion. The patient is edentulous. Other neck: No significant cervical adenopathy is present. No focal mucosal or submucosal lesions are present. The submandibular and parotid glands are within normal limits bilaterally. Upper chest: A medial right upper lobe chest mass decreased in size from the previous PET scan. The lesion measures 6.0 x 7.3 cm. There is postobstructive pneumonitis in the right upper lobe. A right pleural effusion is present. Ill-defined opacity in the superior segment of the left lower lobe is incompletely imaged. Review of the MIP images confirms the above findings CTA HEAD FINDINGS Anterior circulation: Atherosclerotic calcifications are present in precavernous cavernous internal carotid arteries bilaterally without a significant stenosis relative to the more distal vessel. The terminus is intact bilaterally. The A1 segments are normal. The right M1 segment is normal. A nonocclusive thrombus is present in the distal left M1 segment at the MCA bifurcation. MCA branch vessels opacify the. They are of decreased caliber compared to the MCA branch vessels on the right. The area some atherosclerotic irregularity a MCA branch vessels. No focal occlusion is evident. Increased collaterals are evident on the left. Posterior circulation: Moderate stenosis is present in the dominant left vertebral artery at the dural margin. PICA origins are visualized and normal. The basilar artery is normal. Both posterior cerebral arteries originate from basilar tip. There is mild irregularity without focal stenosis or occlusion. Venous sinuses: Dural sinuses are patent. The left transverse sinus dominant. Anatomic variants: None Delayed phase: 2 punctate areas of  enhancement in the right frontal lobe from previous MRI are not seen on today's study. No areas of enhancement are evident on the source images. Review of the MIP images confirms the above findings  IMPRESSION: 1. Thrombus in the distal left M1 segment to the bifurcation. Flow is present in reduced caliber left MCA branch vessels. The thrombus is likely nonocclusive. Collateral vessels are also present. 2. Saddle embolus at the innominate artery bifurcation into the right common carotid artery and right subclavian artery. This embolus is nonocclusive. 3. Atherosclerotic changes at the carotid bifurcations and cavernous internal carotid arteries bilaterally without significant stenosis. 4. Atherosclerotic calcifications with moderate stenosis in the proximal left vertebral artery. These results were called by telephone at the time of interpretation on 10/15/2016 at 12:30 pm to Dr. Leonel Ramsay , who verbally acknowledged these results. Electronically Signed   By: San Morelle M.D.   On: 10/15/2016 13:05   Ct Head Wo Contrast  Result Date: 10/15/2016 CLINICAL DATA:  Stroke.  Post endovascular revascularization EXAM: CT HEAD WITHOUT CONTRAST TECHNIQUE: Contiguous axial images were obtained from the base of the skull through the vertex without intravenous contrast. COMPARISON:  CT head 10/07/2016 FINDINGS: Brain: Improvement in hypodensity in the left MCA territory compared with the earlier scan. There remains hypodensity in the left inferior temporal lobe extending into the insular cortex compatible with infarction. There is edema effacing the sylvian fissure on the left. Low-density in the frontal operculum has significantly improved as has the edema in the insular cortex. Negative for acute hemorrhage. Ventricle size normal. 2 mm midline shift to the right due to edema. Vascular: Normal arterial and venous enhancement due to prior cerebral angiogram. Skull: Negative Sinuses/Orbits: Mild mucosal edema paranasal sinuses. Other: None IMPRESSION: Improvement in left MCA hypodensity following clot retrieval. There remains low-density in the left inferior temporal lobe and insular cortex with edema which has  improved since earlier today. No acute hemorrhage.  Mild midline shift to the right. Electronically Signed   By: Franchot Gallo M.D.   On: 10/15/2016 14:50   Ct Soft Tissue Neck W Contrast  Result Date: 11/02/2016 CLINICAL DATA:  66 year old male with widely metastatic lung cancer. Status post left MCA infarct and right subclavian/carotid thrombectomy 3 weeks ago. Neck mass. EXAM: CT NECK WITH CONTRAST TECHNIQUE: Multidetector CT imaging of the neck was performed using the standard protocol following the bolus administration of intravenous contrast. CONTRAST:  85m ISOVUE-300 IOPAMIDOL (ISOVUE-300) INJECTION 61% COMPARISON:  Pretreatment CTA head and neck 10/15/2016 FINDINGS: Pharynx and larynx: Larynx and pharynx contours remain normal. Negative parapharyngeal and retropharyngeal spaces. Salivary glands: Negative sublingual space, submandibular glands and parotid glands. Thyroid: Negative thyroid. Lymph nodes: There is no discrete cervical lymphadenopathy. There is stable mild right peritracheal lymphadenopathy in the upper chest measuring 8 mm (series 2, image 88). Vascular: Surgical clips are demonstrated along the proximal right subclavian and right common carotid arteries distal to the right brachiocephalic artery bifurcation which is on series 2, image 90. No residual proximal right common carotid or right subclavian artery stenosis or thrombus. Both the right CCA and right subclavian arteries are patent and have a normal configuration. However, there is a new large low to intermediate density (15-65 Hounsfield units) inverted-U-shaped mass occupying the lateral aspect of the right carotid space at the level of the superior thyroid and larynx, and extending laterally. See coronal images 51 through 55. The mass encompasses an area of 69 x 73 x 64 mm (AP by transverse by CC). There  is severe mass effect on and effacement of the right IJ cava however the right IJ does remain patent (series 2, image 64). The  large mass is also inseparable from the deep aspect of the right sternocleidomastoid muscle. The mass does not appear to affect the right subclavian vein or artery. Major vascular structures in the neck and at the skullbase remain patent despite the above. Limited intracranial: Negative. Visualized orbits: Negative. Mastoids and visualized paranasal sinuses: Visualized paranasal sinuses and mastoids are stable and well pneumatized. Skeleton: Stable visualized osseous structures. Upper chest: The level of the large anterior right perihilar lung mass is not included. Decreased layering right pleural effusion. Partially visible irregular opacity in the lateral and posterior right upper lobe. Stable mild right superior peritracheal lymphadenopathy (8 mm short axis). Stable and negative visualized aortic arch except for calcified atherosclerosis. IMPRESSION: 1. 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 This at least partially occupies the right carotid space, and there is associated severe effacement of the right internal jugular vein which does remain patent. Although nonspecific I favor this is a large hematoma, and I suspect the right IJ is being compressed rather than partially thrombosed (i.e. no thrombus within the lumen of the IJ). I considered metastatic disease as the etiology of this mass, but feel that unlikely (see # 3). 2. The mass does abut the proximal right CCA and right subclavian arteries, but those vessels maintain normal shape and patency. 3. No cervical lymphadenopathy, and no other neck mass. Stable mild right superior peritracheal lymphadenopathy. Electronically Signed   By: Genevie Ann M.D.   On: 11/02/2016 14:06   Ct Angio Neck W Or Wo Contrast  Result Date: 10/15/2016 CLINICAL DATA:  Acute onset right-sided weakness and abnormal speech. EXAM: CT ANGIOGRAPHY HEAD AND NECK TECHNIQUE: Multidetector CT imaging of the head and neck  was performed using the standard protocol during bolus administration of intravenous contrast. Multiplanar CT image reconstructions and MIPs were obtained to evaluate the vascular anatomy. Carotid stenosis measurements (when applicable) are obtained utilizing NASCET criteria, using the distal internal carotid diameter as the denominator. CONTRAST:  90 mL Isovue 370 COMPARISON:  MRI brain 05/14/2016 FINDINGS: CTA NECK FINDINGS Aortic arch: Atherosclerotic calcifications are present at the aortic arch without compromise of the great vessel origins. There is no aneurysm. Right carotid system: The a saddle embolus is present at bifurcation of the innominate artery and to the right common carotid artery and subclavian artery. This is nonocclusive. There is some nonocclusive thrombus extending more superiorly within the right common carotid artery is well. Atherosclerotic changes are present at the right carotid bifurcation without significant stenosis. Left carotid system: The left common carotid artery is within normal limits. Atherosclerotic changes are present at left carotid bifurcation. There is calcified plaque proximally. Noncalcified posterior plaque more distally narrows the with 3 mm. This compares with a distal measurement of 4.5 mm. Posterior calcification just below the skullbase may reflect a remote injury. There is no associated stenosis. Vertebral arteries: The vertebral arteries both originate from the subclavian arteries. There is no focal stenosis of the origin. The left vertebral artery the dominant vessel. There is no significant stenosis cannot air vertebral artery in the. Dense atherosclerotic calcifications narrowing of the left vertebral artery limiting to 1.5 mm. Skeleton: Vertebral body heights and alignment are normal. There is no focal lytic or blastic lesion. The patient is edentulous. Other neck: No significant cervical adenopathy is present. No  focal mucosal or submucosal lesions are  present. The submandibular and parotid glands are within normal limits bilaterally. Upper chest: A medial right upper lobe chest mass decreased in size from the previous PET scan. The lesion measures 6.0 x 7.3 cm. There is postobstructive pneumonitis in the right upper lobe. A right pleural effusion is present. Ill-defined opacity in the superior segment of the left lower lobe is incompletely imaged. Review of the MIP images confirms the above findings CTA HEAD FINDINGS Anterior circulation: Atherosclerotic calcifications are present in precavernous cavernous internal carotid arteries bilaterally without a significant stenosis relative to the more distal vessel. The terminus is intact bilaterally. The A1 segments are normal. The right M1 segment is normal. A nonocclusive thrombus is present in the distal left M1 segment at the MCA bifurcation. MCA branch vessels opacify the. They are of decreased caliber compared to the MCA branch vessels on the right. The area some atherosclerotic irregularity a MCA branch vessels. No focal occlusion is evident. Increased collaterals are evident on the left. Posterior circulation: Moderate stenosis is present in the dominant left vertebral artery at the dural margin. PICA origins are visualized and normal. The basilar artery is normal. Both posterior cerebral arteries originate from basilar tip. There is mild irregularity without focal stenosis or occlusion. Venous sinuses: Dural sinuses are patent. The left transverse sinus dominant. Anatomic variants: None Delayed phase: 2 punctate areas of enhancement in the right frontal lobe from previous MRI are not seen on today's study. No areas of enhancement are evident on the source images. Review of the MIP images confirms the above findings IMPRESSION: 1. Thrombus in the distal left M1 segment to the bifurcation. Flow is present in reduced caliber left MCA branch vessels. The thrombus is likely nonocclusive. Collateral vessels are also  present. 2. Saddle embolus at the innominate artery bifurcation into the right common carotid artery and right subclavian artery. This embolus is nonocclusive. 3. Atherosclerotic changes at the carotid bifurcations and cavernous internal carotid arteries bilaterally without significant stenosis. 4. Atherosclerotic calcifications with moderate stenosis in the proximal left vertebral artery. These results were called by telephone at the time of interpretation on 10/15/2016 at 12:30 pm to Dr. Leonel Ramsay , who verbally acknowledged these results. Electronically Signed   By: San Morelle M.D.   On: 10/15/2016 13:05   Ct Chest Wo Contrast  Result Date: 10/17/2016 CLINICAL DATA:  RIGHT lung mass.  intubated, pneumothorax EXAM: CT CHEST WITHOUT CONTRAST TECHNIQUE: Multidetector CT imaging of the chest was performed following the standard protocol without IV contrast. COMPARISON:  CT chest pain radiograph 10/17/2016, PET-CT 05/02/2016 FINDINGS: Cardiovascular: No pericardial fluid.  Mild coronary calcification. Mediastinum/Nodes: No mediastinal lymphadenopathy. Lungs/Pleura: Large mass centered in the RIGHT middle lobe measuring 6.5 cm. This is hypermetabolic on comparison PET-CT scan. There are multiple rounded nodules in the LEFT lower lobe LEFT upper lobe measuring approximately 2.5 cm each. There are 9 nodules. Single RIGHT upper lobe nodule measuring 3 cm similar. These nodules measure less than 1 cm on comparison PET-CT scan. Endotracheal to in the distal trachea in good position. Moderate size RIGHT pneumothorax. Extensive subcutaneous gas along the RIGHT chest wall. Upper Abdomen: NG tube in stomach Musculoskeletal: Several nondisplaced rib fractures of the RIGHT seventh and eighth ribs. IMPRESSION: 1. Moderate-sized RIGHT pneumothorax as described on radiograph same day. 2. 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 3. Endotracheal tube in good position.  4. RIGHT suprahilar mass. 5. Bilateral round pulmonary metastasis  significantly enlarged from prior PET-CT scan. These results will be called to the ordering clinician or representative by the Radiologist Assistant, and communication documented in the PACS or zVision Dashboard. Electronically Signed   By: Suzy Bouchard M.D.   On: 10/17/2016 14:16   Ct Angio Chest Pe W And/or Wo Contrast  Result Date: 10/30/2016 CLINICAL DATA:  66 y/o M; acute chest pain with concern for pulmonary embolus. History of pulmonary malignancy. EXAM: CT ANGIOGRAPHY CHEST WITH CONTRAST TECHNIQUE: Multidetector CT imaging of the chest was performed using the standard protocol during bolus administration of intravenous contrast. Multiplanar CT image reconstructions and MIPs were obtained to evaluate the vascular anatomy. CONTRAST:  75 cc Isovue 370 COMPARISON:  10/17/2016 CT chest FINDINGS: Cardiovascular: There is a right suprahilar mass measuring up to 6.9 cm in the right suprahilar region (series 4, image 39), previously 65 mm, invading the right superior pulmonary vein and extending into the right aspect of the left atrium (series 4, image 52). Motion artifact in the lungs. No pulmonary embolus identified. There is narrowing of the right superior and middle lobe pulmonary artery is due to increasing invasion from right suprahilar are pulmonary mass. Normal caliber thoracic aorta with mild calcific atherosclerosis. Normal heart size. No pericardial effusion. Mild coronary artery calcification. Mediastinum/Nodes: Left hilar lymphadenopathy is increased in size from the prior study. Normal trachea, esophagus, and thyroid gland. Lungs/Pleura: Numerous metastasis are present within the lungs bilaterally which are increased in size from the prior CT for example in the left upper lobe there is a nodule measuring 29 x 27 mm, previously 26 x 25 mm (series 6, image 69). Trace residual right pneumothorax. There consolidations within the left  upper lobe lingula and left lower lobe likely representing interval development of pneumonia. Upper Abdomen: No acute abnormality. Musculoskeletal: Air within the anterior chest wall is markedly decreased in comparison with the prior CT of the chest with small residual. Soft tissue lung nodule of the right anterior inferior chest wall subcutaneous fat measuring 16 mm is stable (series 4, image 71). Right lateral sixth and seventh mildly displaced acute rib fractures as seen on prior CT of the chest. Review of the MIP images confirms the above findings. IMPRESSION: 1. Moderate respiratory motion artifact. No pulmonary embolus identified. 2. Interval development of consolidations within the left lung base compatible with pneumonia. 3. Interval slight increase in size of right suprahilar mass and metastasis in the lungs bilaterally. 4. The right suprahilar mass invades the right superior pulmonary vein extending into the left atrium. 5. Trace residual right apical pneumothorax and small diminished volume of air in the anterior chest wall. 6. Stable mildly displaced right lateral sixth and seventh rib fractures. Electronically Signed   By: Kristine Garbe M.D.   On: 10/30/2016 22:07   Mr Brain Wo Contrast  Result Date: 10/16/2016 CLINICAL DATA:  66 y/o M; left MCA distribution infarct post revascularization. EXAM: MRI HEAD WITHOUT CONTRAST TECHNIQUE: Multiplanar, multiecho pulse sequences of the brain and surrounding structures were obtained without intravenous contrast. COMPARISON:  10/15/2016 CT angiogram of the head. FINDINGS: Brain: Diffusion restriction is present within the left insula, left anterior temporal lobe, and left lateral frontal lobe in distribution similar to CT perfusion CBF less than 30%. There is an additional area of diffusion restriction within the left parietal lobe not indicated on the perfusion study. Areas of infarction demonstrate T2 FLAIR hyperintense signal abnormality and mild  local mass effect. Motion degraded sagittal T1 weighted sequence. Small chronic infarction  is present within the right parietal cortex. There are mild chronic microvascular ischemic changes of white matter and mild brain parenchymal volume loss. No abnormal susceptibility hypointensity is present to indicate intracranial hemorrhage. No hydrocephalus or extra-axial collection. Vascular: Normal flow voids. Skull and upper cervical spine: Normal marrow signal. Sinuses/Orbits: Negative. Other: None. IMPRESSION: Left MCA distribution diffusion restriction is present in the left insula, left anterior temporal lobe, and left lateral frontal lobe in distribution similar to CT perfusion CBF less than 30%. There is a small additional area of diffusion restriction within the left parietal lobe not indicated on the perfusion study. Findings are compatible with acute infarction. No associated hemorrhage identified. Electronically Signed   By: Kristine Garbe M.D.   On: 10/16/2016 04:49   Ir Angiogram Extremity Right  Result Date: 10/16/2016 INDICATION: Global aphasia. Right-sided weakness. CT perfusion examination revealed mismatch volume of 131 mL with the CBF < 30% volume of 43 mL, and a mismatch ratio of 4.0. Large near complete occlusive filling defect in the distal right middle cerebral artery extending into the bifurcation middle cerebral artery just proximal to the bifurcation and extending into the bifurcation. EXAM: 1. EMERGENT LARGE VESSEL OCCLUSION THROMBOLYSIS (anterior CIRCULATION) COMPARISON:  CT angiogram of 10/15/2016. MEDICATIONS: Ancef 2 g IV. The antibiotic was administered within 1 hour of the procedure. ANESTHESIA/SEDATION: General anesthesia. CONTRAST:  Isovue 300 approximately 60 mL. FLUOROSCOPY TIME:  Fluoroscopy Time: 10 minutes 12 seconds (837 mGy). COMPLICATIONS: None immediate. TECHNIQUE: Following a full explanation of the procedure along with the potential associated complications, an  informed witnessed consent was obtained. The risks of intracranial hemorrhage of 10%, worsening neurological deficit, ventilator dependency, death and inability to revascularize were all reviewed in detail with the patient's wife. The patient was then put under general anesthesia by the Department of Anesthesiology at [facility]. The right groin was prepped and draped in the usual sterile fashion. Thereafter using modified Seldinger technique, transfemoral access into the right common femoral artery was obtained without difficulty. Over a 0.035 inch guidewire a 5 French Pinnacle sheath was inserted. Through this, and also over a 0.035 inch guidewire a 5 [place] JB 1 catheter was advanced to the aortic arch region and selectively positioned in the innominate artery and the left common carotid artery. FINDINGS: The innominate arteriogram demonstrates a long segment lobulated filling defect which extends from the mid innominate artery across the origin of the right common carotid artery and the right vertebral artery. No angiographic flow is noted in the right vertebral artery. However, there is flow noted in the right common carotid artery. The right common carotid artery on the lateral projection demonstrates wide patency at the right carotid artery bifurcation to the supraclinoid segment with opacification of the visualized right MCA distribution on the lateral projection. The left common carotid arteriogram demonstrates the origin of the left external carotid artery to be patent. The opacified portions of the left external carotid artery appear patent. The left internal carotid artery at the bulb to the cranial skull base demonstrates wide patency, with a small shelf-like plaque noted along the posterior wall of the left internal carotid artery at the distal aspect of the bulb. The left internal carotid artery is seen to opacify to the cranial skull base. The petrous segment is widely patent. There is a  focal stenoses of approximately 30% of the caval cavernous segment of the left internal carotid artery. Distal to this, the distal cavernous and the supraclinoid segments are widely patent. The  left middle cerebral artery in its M1 segment demonstrates patency. There is attenuated caliber in the distal left M1 segment with near complete occlusion extending into the inferior division with near complete occlusion of the superior division. Multiple filling defects are seen in this region. The left anterior cerebral artery is seen to opacify normally into the capillary and venous phases. The delayed arterial phase demonstrates partial retrograde opacification of the perisylvian branches from the pericallosal and callosal marginal branches. Also noted is prompt opacification via the anterior communicating artery of the right anterior cerebral A2 segment and the right anterior cerebral A1 segment. Opacification of the right middle cerebral artery M1 segment and distally is also noted from the left common carotid artery injection. PROCEDURE: The diagnostic JB 1 catheter in the left common carotid artery was then exchanged over a 0.035 inch 300 cm Rosen exchange guidewire for a 55 cm 8 French Brite tip neurovascular sheath using biplane roadmap technique and constant fluoroscopic guidance. Good aspiration was obtained from the hub of the 8 French neurovascular sheath. This was then connected to continuous heparinized saline infusion. Over the [employer] guidewire, a 95 cm 8 [place] FlowGate balloon guide catheter which been prepped with 50% contrast and 50% heparinized saline infusion was then advanced and positioned in the left common carotid artery. The guidewire was removed. Good aspiration was obtained from the hub of the 8 [place] FlowGate guide catheter. A gentle constant injection demonstrated no evidence of spasms, dissections or of intraluminal filling defects. Over a 0.035 inch Roadrunner guidewire, using  biplane roadmap technique and constant fluoroscopic guidance, the 8 [place] FlowGate guide catheter was then advanced to the cervical petrous junction of the left internal carotid artery. The guidewire was removed. Good aspiration was obtained from the hub of the 8 [place] FlowGate balloon guide catheter. A gentle contrast injection demonstrated no evidence spasms, dissections or of intraluminal filling defects. At this time, in a coaxial manner and with constant heparinized saline infusion using biplane roadmap technique and constant fluoroscopic guidance, a Trevo ProVue 021 microcatheter was advanced over a 0.014 inch Softip Synchro micro guidewire to the distal end of the 8 [place] FlowGate guide catheter. With the micro guidewire leading with a J-tip configuration, the combination was navigated without difficulty to the supraclinoid left ICA. A torque device was then utilized to advance the micro guidewire to the left middle cerebral artery followed by the microcatheter. The micro guidewire was then advanced without difficulty through the inferior division of left middle cerebral artery into the M2 M3 region followed by the microcatheter. The micro guidewire was removed. Good aspiration was obtained from the hub of the microcatheter. Gentle contrast injection demonstrated a brisk antegrade flow distally. A 4 mm x 40 mm Solitaire FR retrieval device was then purged with 50% contrast and 50% heparinized saline infusion. Thereafter this was advanced again in a coaxial manner and with constant heparinized saline infusion using biplane roadmap technique and constant fluoroscopic guidance to the distal end of the microcatheter. The O ring on the delivery microcatheter was then loosened. With slight forward gentle traction with the right hand on the delivery micro guidewire with the left hand the delivery microcatheter was retrieved unsheathing the distal end and then the proximal portion of the retrieval device. The tip  of the microcatheter was just proximal to the proximal portion of the retrieval device. A brisk control arteriogram performed through the 8 [place] FlowGate guide catheter in the left internal carotid artery demonstrated brisk flow through  the inferior division and partially through the superior division though improved. A TICI 2b reperfusion was noted. The balloon in the distal left internal carotid artery FlowGate guide catheter was then inflated for proximal flow arrest. The proximal portion of the retrieval device was then captured into the microcatheter. There on after with constant aspiration being applied with a 60 mL syringe at the hub of the [facility] guide catheter, the combination of the retrieval device and the microcatheter were retrieved and removed. Aspiration was continued as the balloon was deflated. Free back bleed was noted at the hub of 8 [place] FlowGate guide catheter. The aspirate contained 2 chunks of clots. Also noted in the [employer] was a another piece of mixed bloody and fibrosis clot. A control arteriogram performed through the 8 [place] FlowGate guide catheter in the left internal carotid artery demonstrated complete angiographic revascularization of the occluded left middle cerebral artery distribution. No angiographic evidence of filling defects or occlusions or stenosis was seen. No evidence of extravasation, or mass-effect on the major vessel was noted. Moderate spasm was noted in the inferior division of the left middle cerebral artery which responded promptly to 2 aliquots of 25 mics of nitroglycerin given through the [facility] guide catheter. A final control arteriogram performed through the [facility] guide catheter in the left internal carotid artery demonstrated complete angiographic revascularization of the left MCA distribution. The left anterior cerebral artery appeared patent with brisk flow into the contralateral cerebral hemisphere as described previously. The patient's  neurological status and hemodynamic status remained stable throughout the procedure. The 8 [place] FlowGate guide catheter and the 8 [place] Brite tip neurovascular sheath were retrieved in the abdominal aorta and exchanged over a J-tip guidewire for an 8 [place] Pinnacle sheath. This was then removed successfully with the application of an external closure device with compression at the puncture site for 20 minutes. The groin site appeared soft without evidence of a hematoma. The distal pulses remained stable palpable bilaterally and unchanged compared to prior to the procedure. IMPRESSION: Status post endovascular complete revascularization of left MCA occlusion with 1 pass with the Solitaire FR 4 mm x 40 mm retrieval device achieving a TICI 3 reperfusion. Groin puncture time 13:12. Revascularization with TICI 2b at 13:35. Revascularization with TICI 3 at 13:39. PLAN: Patient to CT scanner for postprocedural CT scan of brain. Electronically Signed   By: Luanne Bras M.D.   On: 10/15/2016 21:34   Ct Cerebral Perfusion W Contrast  Result Date: 10/15/2016 CLINICAL DATA:  Acute onset of right sided weakness and abnormal speech. Lung cancer. EXAM: CT PERFUSION BRAIN TECHNIQUE: Multiphase CT imaging of the brain was performed following IV bolus contrast injection. Subsequent parametric perfusion maps were calculated using RAPID software. CONTRAST:  90 mL Isovue 370 COMPARISON:  MRI brain 05/14/2016. FINDINGS: CT Brain Perfusion Findings: CBF (<30%) Volume: 36m Perfusion (Tmax>6.0s) volume: 1732mMismatch Volume: 13165mnfarction Location:Left MCA territory. Arterial and venous and was are excellent. Minimal patient motion is evident. IMPRESSION: 1. The acute infarct involving the left MCA territory with estimated volume of CT a less than 30% at 43 mL. 2. Mismatch volume 131 mL. These results were called by telephone at the time of interpretation on 10/15/2016 at 12:30 pm to Dr. KIRLeonel Ramsaywho verbally  acknowledged these results. Electronically Signed   By: ChrSan MorelleD.   On: 10/15/2016 12:39   Dg Chest Portable 1 View  Result Date: 10/30/2016 CLINICAL DATA:  Shortness of Breath EXAM: PORTABLE CHEST 1  VIEW COMPARISON:  10/27/2016.  CT scan 10/17/2016 FINDINGS: Right hilar mass again noted. Bilateral pulmonary metastases are similar prior. Asymmetric elevation right hemidiaphragm unchanged. Cardiopericardial silhouette is at upper limits of normal for size. Nonacute right rib fractures evident. Telemetry leads overlie the chest. IMPRESSION: Stable exam. Right hilar mass with bilateral pulmonary metastases. No new or acute interval findings. Electronically Signed   By: Misty Stanley M.D.   On: 10/30/2016 18:45   Dg Chest Port 1 View  Result Date: 10/27/2016 CLINICAL DATA:  Cough and shortness of breath EXAM: PORTABLE CHEST 1 VIEW COMPARISON:  October 25, 2016 FINDINGS: Multiple metastatic foci are noted in both lungs, stable. The dominant mass with adjacent consolidation and volume loss in the right mid lung remain. There is stable elevation of the right hemidiaphragm. Heart size and pulmonary vascularity are normal. No adenopathy. There is aortic atherosclerosis. There are stable rib fractures on the right. There are surgical clips the right neck region. Subcutaneous air on the right is stable. IMPRESSION: Persistent volume loss the right with elevation the right hemidiaphragm. Large mass in the medial right mid lung with adjacent consolidation. Multiple rounded metastatic foci noted bilaterally. Stable cardiac silhouette. There is aortic atherosclerosis. Rib fractures on the right appear stable. No pneumothorax evident. Electronically Signed   By: Lowella Grip III M.D.   On: 10/27/2016 07:26   Dg Chest Port 1 View  Result Date: 10/25/2016 CLINICAL DATA:  Evaluate right-sided pneumothorax. EXAM: PORTABLE CHEST 1 VIEW COMPARISON:  October 24, 2016 FINDINGS: A right central mass persists with  peripheral atelectasis. Multiple masses are seen in the left mid and lower lung. No pneumothorax. Elevation of the right hemidiaphragm remains. The cardiomediastinal silhouette is stable. A small amount of air seen in the soft tissues at the base of the neck on the right. Right-sided rib fractures are noted. IMPRESSION: 1. Bilateral lung masses. No pneumothorax seen today. Persistent right rib fractures. Electronically Signed   By: Dorise Bullion III M.D   On: 10/25/2016 08:23   Dg Chest Port 1 View  Result Date: 10/24/2016 CLINICAL DATA:  Follow-up pneumothorax EXAM: PORTABLE CHEST 1 VIEW COMPARISON:  10/24/2016, 10/23/2016 FINDINGS: Removal of right-sided chest tube. No definitive right pneumothorax. Elevated right diaphragm. Right hilar mass. Multiple bilateral pulmonary masses as before. Stable heart size. Atherosclerosis. IMPRESSION: 1. Removal of right-sided chest tube. No definitive right pneumothorax 2. Grossly stable appearance of the thorax with bilateral lung masses Electronically Signed   By: Donavan Foil M.D.   On: 10/24/2016 18:59   Dg Chest Port 1 View  Result Date: 10/24/2016 CLINICAL DATA:  Hypertension.  Lung carcinoma.  Recent pneumothorax EXAM: PORTABLE CHEST 1 VIEW COMPARISON:  October 23, 2016 FINDINGS: Chest tube is present on the right without appreciable pneumothorax. Multiple mass lesions are noted bilaterally consistent with metastases. The dominant right perihilar mass is stable with patchy consolidation in the right mid lung, stable. There is volume loss on the right with elevation of the right hemidiaphragm. The heart size is normal. The pulmonary vascularity is normal. There is degenerative change in each shoulder. There are rib fractures on the right. There is atherosclerotic calcification in the aorta. IMPRESSION: No pneumothorax with chest tube unchanged in position. Widespread pulmonary metastases. Dominant mass on the right with adjacent consolidation in the right mid lung  stable. Stable cardiac silhouette. There is aortic atherosclerosis. Electronically Signed   By: Lowella Grip III M.D.   On: 10/24/2016 07:58   Dg Chest [facility]  Result Date: 10/23/2016 CLINICAL DATA:  Right pneumothorax. EXAM: PORTABLE CHEST 1 VIEW COMPARISON:  10/22/2016 FINDINGS: The endotracheal and enteric tubes have been removed. Right chest tube remains in place. Cardiomediastinal silhouette is unchanged. Right hemidiaphragm remains elevated. Right hilar mass and additional bilateral lung masses/ nodules are unchanged. Streaky opacity extending laterally from the right hilar mass is similar to the prior study. There may be a small right pleural effusion. No pneumothorax is identified. Soft tissue emphysema is again noted in the right chest wall. IMPRESSION: 1. No pneumothorax. 2. Unchanged appearance of the lungs including bilateral masses. Electronically Signed   By: Logan Bores M.D.   On: 10/23/2016 17:12   Dg Chest Port 1 View  Result Date: 10/22/2016 CLINICAL DATA:  Respiratory failure, known lung malignancy. History of COPD EXAM: PORTABLE CHEST 1 VIEW COMPARISON:  Portable chest x-ray of October 21, 2016 FINDINGS: The right hemidiaphragm is higher today than on yesterday's study. A large right hilar mass is stable. The right chest tube is in stable position. No definite right-sided pneumothorax is observed. Multiple pulmonary parenchymal masses are noted in the left mid and lower lung and at the right lung base. The heart and pulmonary vascularity are normal. The endotracheal tube tip lies 4.7 cm above the carina. The esophagogastric tube tip projects below the inferior margin of the image. IMPRESSION: Fairly stable appearance of the chest allowing for differences in positioning. Multiple bilateral pulmonary masses with dominant right hilar mass. No pneumothorax or pleural effusion. Electronically Signed   By: March  [place] M.D.   On: 10/22/2016 07:06   Dg Chest Port 1 View  Result Date:  10/21/2016 CLINICAL DATA:  Follow-up right-sided pneumothorax with chest tube treatment. EXAM: PORTABLE CHEST 1 VIEW COMPARISON:  Portable chest x-ray of August 22, 2016 FINDINGS: There remains volume loss on the right. No definite pneumothorax is observed. A central soft tissue masslike density is stable in the right hilar region. The right-sided chest tube tip projects over the posterior aspect of the fourth rib and is stable. No pleural effusion is observed. On the left there are persistent nodular masses in the lower lung. The heart and pulmonary vascularity are normal. The endotracheal tube tip lies 3.5 cm above the carina. The esophagogastric tube tip projects below the inferior margin of the image. IMPRESSION: Stable right hilar and left mid and lower lung parenchymal masses. No right-sided pneumothorax is evident today. There is no pleural effusion. The support tubes are in reasonable position. Electronically Signed   By: Omarie  [place] M.D.   On: 10/21/2016 07:05   Dg Chest Port 1 View  Result Date: 10/20/2016 CLINICAL DATA:  Right pneumothorax EXAM: PORTABLE CHEST 1 VIEW COMPARISON:  10/19/2016 FINDINGS: Cardiomediastinal silhouette is stable. Right chest tube is unchanged in position. Again noted right perihilar mass. No pneumothorax. Multiple lung nodules are stable from prior exam. Stable endotracheal and NG tube position. Mild residual subcutaneous emphysema right chest wall and right supraclavicular region. IMPRESSION: Right chest tube is unchanged in position. Again noted right perihilar mass. No pneumothorax. Multiple lung nodules are stable from prior exam. Stable endotracheal and NG tube position. Electronically Signed   By: Lahoma Crocker M.D.   On: 10/20/2016 11:38   Dg Chest Port 1 View  Result Date: 10/19/2016 CLINICAL DATA:  Respiratory failure.  Cancer. EXAM: PORTABLE CHEST 1 VIEW COMPARISON:  10/17/2016, 10/18/2016 FINDINGS: Right chest tube remains in place. No pneumothorax identified on  the right. Decrease in chest wall gas on the right.  Large mass lesion in the right perihilar region. Multiple lung nodules on the left compatible with metastatic cancer. Mild bibasilar atelectasis/ infiltrate with mild progression in the interval. Endotracheal tube in good position. NG tube in place entering the stomach. IMPRESSION: Right chest tube remains in place.  No pneumothorax Mild Progression of bibasilar atelectasis/ infiltrate Endotracheal tube remains in good position. Electronically Signed   By: Franchot Gallo M.D.   On: 10/19/2016 07:14   Dg Chest Port 1 View  Result Date: 10/18/2016 CLINICAL DATA:  Respiratory failure EXAM: PORTABLE CHEST 1 VIEW COMPARISON:  October 17, 2016 FINDINGS: The ETT remains in good position, terminating 3.2 cm above the carina. The enteric tube courses below today's study, below the diaphragm. The right chest tube remains in place and terminates between the medial fourth and fifth posterior ribs. There is significant subcutaneous air in the right neck in lateral chest wall. A discrete pneumothorax is not identified but evaluation is limited as support apparatus overlies the right apex. A right hilar mass and multiple left greater than right pulmonary nodules are unchanged. Right rib fractures are again identified. No other interval changes. IMPRESSION: 1. Support apparatus remains in place. No definitive right-sided pneumothorax but support apparatus obscures the right apex. 2. Unchanged right hilar mass and lung nodules. 3. Extensive subcutaneous air in the right neck and lateral chest wall. Electronically Signed   By: Dorise Bullion III M.D   On: 10/18/2016 07:15   Dg Chest Port 1 View  Result Date: 10/17/2016 CLINICAL DATA:  Right pneumothorax. EXAM: PORTABLE CHEST 1 VIEW COMPARISON:  Chest radiograph and CT 10/17/2016 FINDINGS: Endotracheal tube terminates approximately 3.5 cm above the carina. Enteric tube courses towards the left upper abdomen with tip not  imaged. A right chest tube has been placed and terminates over the medial posterior fourth rib interspace. There is a small right pneumothorax, decreased in size from today's earlier studies. A right hilar mass and multiple left greater than right lung nodules are again noted. No large pleural effusion is seen. Extensive subcutaneous emphysema remains in the right chest wall extending into the neck. Right rib fractures are noted. IMPRESSION: 1. Interval right chest tube placement with decreased size of right pneumothorax. 2. Unchanged right hilar mass and lung nodules. Electronically Signed   By: Logan Bores M.D.   On: 10/17/2016 16:04   Dg Chest Port 1 View  Result Date: 10/17/2016 CLINICAL DATA:  Post endotracheal tube placement EXAM: PORTABLE CHEST 1 VIEW COMPARISON:  10/17/2016 FINDINGS: Cardiomediastinal silhouette is stable. Again noted is right hilar mass. Nodular metastasis left lung are stable. Endotracheal tube in place with tip 3.4 cm above the carina. Extensive subcutaneous emphysema right chest wall again noted. Stable small right lateral pneumothorax. Subcutaneous emphysema right supraclavicular region P IMPRESSION: Again noted is right hilar mass. Nodular metastasis left lung are stable. Endotracheal tube in place with tip 3.4 cm above the carina. Extensive subcutaneous emphysema right chest wall again noted. Stable small right lateral pneumothorax. Electronically Signed   By: Lahoma Crocker M.D.   On: 10/17/2016 10:30   Dg Chest Port 1 View  Result Date: 10/17/2016 CLINICAL DATA:  Intubation. EXAM: PORTABLE CHEST 1 VIEW COMPARISON:  Radiograph of October 16, 2016. FINDINGS: Endotracheal tube is approximately 5 cm above the carina in grossly good position. Large right hilar mass in left pulmonary lesions are noted consistent with metastatic disease. There is interval development of mild right basilar pneumothorax with subcutaneous emphysema seen over the right lateral chest  wall. Right rib  fractures are again noted. IMPRESSION: Endotracheal tube in grossly good position. Stable large right hilar mass and multiple left pulmonary nodules are noted concerning for metastatic disease. Interval development of mild right basilar pneumothorax with associated subcutaneous emphysema overlying right lateral chest wall. Right rib fractures are again noted. Critical Value/emergent results were called by telephone at the time of interpretation on 10/17/2016 at 9:03 am to Dr. Asa Saunas , who verbally acknowledged these results. Electronically Signed   By: Marijo Conception, M.D.   On: 10/17/2016 09:03   Dg Chest Port 1 View  Result Date: 10/16/2016 CLINICAL DATA:  Ventilator dependent respiratory failure. History of CVA, COPD, lung malignancy, acute right rib fractures. EXAM: PORTABLE CHEST 1 VIEW COMPARISON:  Portable chest x-ray of October 15, 2016 FINDINGS: The endotracheal tube tip lies approximately 1.5 cm above the superior margin of the clavicular heads. The lungs are reasonably well inflated. A large right hilar mass is stable. Multiple pulmonary parenchymal masses in the mid and lower left lung also are stable. There is no pneumothorax or pleural effusion. Fractures of the lateral aspects of the right sixth and seventh ribs are again observed. The heart and pulmonary vascularity are normal. IMPRESSION: High positioning of the endotracheal tube. Advancement by at least 5 cm would be useful. The remainder the findings in the chest are stable. Electronically Signed   By: Siris  Martinique M.D.   On: 10/16/2016 07:43   Dg Chest Port 1 View  Result Date: 10/15/2016 CLINICAL DATA:  Status post intubation EXAM: PORTABLE CHEST 1 VIEW COMPARISON:  04/28/2016 FINDINGS: Cardiac shadow is within normal limits. Multiple pulmonary mass lesions are noted particularly in the region of the right hilum but scattered throughout both lungs. An endotracheal tube is noted in satisfactory position. No pneumothorax is noted.  Multiple right rib fractures are seen. IMPRESSION: Multiple right rib fractures. Endotracheal tube in satisfactory position. Changes consistent with lung carcinoma and multiple scattered pulmonary lesions. These have progressed in the interval from the prior exam of 05/02/2016 Electronically Signed   By: Inez Catalina M.D.   On: 10/15/2016 16:18   Ir Percutaneous Art Thrombectomy/infusion Intracranial Inc Diag Angio  Result Date: 10/16/2016 INDICATION: Global aphasia. Right-sided weakness. CT perfusion examination revealed mismatch volume of 131 mL with the CBF < 30% volume of 43 mL, and a mismatch ratio of 4.0. Large near complete occlusive filling defect in the distal right middle cerebral artery extending into the bifurcation middle cerebral artery just proximal to the bifurcation and extending into the bifurcation. EXAM: 1. EMERGENT LARGE VESSEL OCCLUSION THROMBOLYSIS (anterior CIRCULATION) COMPARISON:  CT angiogram of 10/15/2016. MEDICATIONS: Ancef 2 g IV. The antibiotic was administered within 1 hour of the procedure. ANESTHESIA/SEDATION: General anesthesia. CONTRAST:  Isovue 300 approximately 60 mL. FLUOROSCOPY TIME:  Fluoroscopy Time: 10 minutes 12 seconds (837 mGy). COMPLICATIONS: None immediate. TECHNIQUE: Following a full explanation of the procedure along with the potential associated complications, an informed witnessed consent was obtained. The risks of intracranial hemorrhage of 10%, worsening neurological deficit, ventilator dependency, death and inability to revascularize were all reviewed in detail with the patient's wife. The patient was then put under general anesthesia by the Department of Anesthesiology at [facility]. The right groin was prepped and draped in the usual sterile fashion. Thereafter using modified Seldinger technique, transfemoral access into the right common femoral artery was obtained without difficulty. Over a 0.035 inch guidewire a 5 French Pinnacle sheath was  inserted. Through this, and  also over a 0.035 inch guidewire a 5 [place] JB 1 catheter was advanced to the aortic arch region and selectively positioned in the innominate artery and the left common carotid artery. FINDINGS: The innominate arteriogram demonstrates a long segment lobulated filling defect which extends from the mid innominate artery across the origin of the right common carotid artery and the right vertebral artery. No angiographic flow is noted in the right vertebral artery. However, there is flow noted in the right common carotid artery. The right common carotid artery on the lateral projection demonstrates wide patency at the right carotid artery bifurcation to the supraclinoid segment with opacification of the visualized right MCA distribution on the lateral projection. The left common carotid arteriogram demonstrates the origin of the left external carotid artery to be patent. The opacified portions of the left external carotid artery appear patent. The left internal carotid artery at the bulb to the cranial skull base demonstrates wide patency, with a small shelf-like plaque noted along the posterior wall of the left internal carotid artery at the distal aspect of the bulb. The left internal carotid artery is seen to opacify to the cranial skull base. The petrous segment is widely patent. There is a focal stenoses of approximately 30% of the caval cavernous segment of the left internal carotid artery. Distal to this, the distal cavernous and the supraclinoid segments are widely patent. The left middle cerebral artery in its M1 segment demonstrates patency. There is attenuated caliber in the distal left M1 segment with near complete occlusion extending into the inferior division with near complete occlusion of the superior division. Multiple filling defects are seen in this region. The left anterior cerebral artery is seen to opacify normally into the capillary and venous phases. The delayed  arterial phase demonstrates partial retrograde opacification of the perisylvian branches from the pericallosal and callosal marginal branches. Also noted is prompt opacification via the anterior communicating artery of the right anterior cerebral A2 segment and the right anterior cerebral A1 segment. Opacification of the right middle cerebral artery M1 segment and distally is also noted from the left common carotid artery injection. PROCEDURE: The diagnostic JB 1 catheter in the left common carotid artery was then exchanged over a 0.035 inch 300 cm Rosen exchange guidewire for a 55 cm 8 French Brite tip neurovascular sheath using biplane roadmap technique and constant fluoroscopic guidance. Good aspiration was obtained from the hub of the 8 French neurovascular sheath. This was then connected to continuous heparinized saline infusion. Over the [employer] guidewire, a 95 cm 8 [place] FlowGate balloon guide catheter which been prepped with 50% contrast and 50% heparinized saline infusion was then advanced and positioned in the left common carotid artery. The guidewire was removed. Good aspiration was obtained from the hub of the 8 [place] FlowGate guide catheter. A gentle constant injection demonstrated no evidence of spasms, dissections or of intraluminal filling defects. Over a 0.035 inch Roadrunner guidewire, using biplane roadmap technique and constant fluoroscopic guidance, the 8 [place] FlowGate guide catheter was then advanced to the cervical petrous junction of the left internal carotid artery. The guidewire was removed. Good aspiration was obtained from the hub of the 8 [place] FlowGate balloon guide catheter. A gentle contrast injection demonstrated no evidence spasms, dissections or of intraluminal filling defects. At this time, in a coaxial manner and with constant heparinized saline infusion using biplane roadmap technique and constant fluoroscopic guidance, a Trevo ProVue 021 microcatheter was advanced  over a 0.014 inch Softip Synchro  micro guidewire to the distal end of the 8 [place] FlowGate guide catheter. With the micro guidewire leading with a J-tip configuration, the combination was navigated without difficulty to the supraclinoid left ICA. A torque device was then utilized to advance the micro guidewire to the left middle cerebral artery followed by the microcatheter. The micro guidewire was then advanced without difficulty through the inferior division of left middle cerebral artery into the M2 M3 region followed by the microcatheter. The micro guidewire was removed. Good aspiration was obtained from the hub of the microcatheter. Gentle contrast injection demonstrated a brisk antegrade flow distally. A 4 mm x 40 mm Solitaire FR retrieval device was then purged with 50% contrast and 50% heparinized saline infusion. Thereafter this was advanced again in a coaxial manner and with constant heparinized saline infusion using biplane roadmap technique and constant fluoroscopic guidance to the distal end of the microcatheter. The O ring on the delivery microcatheter was then loosened. With slight forward gentle traction with the right hand on the delivery micro guidewire with the left hand the delivery microcatheter was retrieved unsheathing the distal end and then the proximal portion of the retrieval device. The tip of the microcatheter was just proximal to the proximal portion of the retrieval device. A brisk control arteriogram performed through the 8 [place] FlowGate guide catheter in the left internal carotid artery demonstrated brisk flow through the inferior division and partially through the superior division though improved. A TICI 2b reperfusion was noted. The balloon in the distal left internal carotid artery FlowGate guide catheter was then inflated for proximal flow arrest. The proximal portion of the retrieval device was then captured into the microcatheter. There on after with constant aspiration  being applied with a 60 mL syringe at the hub of the [facility] guide catheter, the combination of the retrieval device and the microcatheter were retrieved and removed. Aspiration was continued as the balloon was deflated. Free back bleed was noted at the hub of 8 [place] FlowGate guide catheter. The aspirate contained 2 chunks of clots. Also noted in the [employer]oh[employer] was a another piece of mixed bloody and fibrosis clot. A control arteriogram performed through the 8 [place] FlowGate guide catheter in the left internal carotid artery demonstrated complete angiographic revascularization of the occluded left middle cerebral artery distribution. No angiographic evidence of filling defects or occlusions or stenosis was seen. No evidence of extravasation, or mass-effect on the major vessel was noted. Moderate spasm was noted in the inferior division of the left middle cerebral artery which responded promptly to 2 aliquots of 25 mics of nitroglycerin given through the [facility] guide catheter. A final control arteriogram performed through the [facility] guide catheter in the left internal carotid artery demonstrated complete angiographic revascularization of the left MCA distribution. The left anterior cerebral artery appeared patent with brisk flow into the contralateral cerebral hemisphere as described previously. The patient's neurological status and hemodynamic status remained stable throughout the procedure. The 8 [place] FlowGate guide catheter and the 8 [place] Brite tip neurovascular sheath were retrieved in the abdominal aorta and exchanged over a J-tip guidewire for an 8 [place] Pinnacle sheath. This was then removed successfully with the application of an external closure device with compression at the puncture site for 20 minutes. The groin site appeared soft without evidence of a hematoma. The distal pulses remained stable palpable bilaterally and unchanged compared to prior to the procedure. IMPRESSION: Status post  endovascular complete revascularization of left MCA occlusion with 1 pass  with the Solitaire FR 4 mm x 40 mm retrieval device achieving a TICI 3 reperfusion. Groin puncture time 13:12. Revascularization with TICI 2b at 13:35. Revascularization with TICI 3 at 13:39. PLAN: Patient to CT scanner for postprocedural CT scan of brain. Electronically Signed   By: Luanne Bras M.D.   On: 10/15/2016 21:34   Dg Hip Unilat With Pelvis 2-3 Views Left  Result Date: 10/10/2016 CLINICAL DATA:  Left hip pain, history of metastatic lung cancer. EXAM: DG HIP (WITH OR WITHOUT PELVIS) 2-3V LEFT COMPARISON:  None. FINDINGS: The hip joints appear normal. However, large lytic lesion is seen involving the left superior and inferior pubic rami consistent with metastatic lesion. Sacroiliac joints appear normal. IMPRESSION: Large lytic lesion seen involving the left superior and inferior pubic rami consistent with metastatic lesion. These results will be called to the ordering clinician or representative by the Radiologist Assistant, and communication documented in the PACS or zVision Dashboard. Electronically Signed   By: Marijo Conception, M.D.   On: 10/10/2016 13:45   Ct Head Code Stroke W/o Cm  Result Date: 10/15/2016 CLINICAL DATA:  Code stroke. Acute onset of right-sided weakness. Difficulty with speech. On lung cancer. EXAM: CT HEAD WITHOUT CONTRAST TECHNIQUE: Contiguous axial images were obtained from the base of the skull through the vertex without intravenous contrast. COMPARISON:  None. FINDINGS: Brain: Areas of hypoattenuation involve the left insular cortex. Additional hypoattenuation is present in the left frontal operculum and the super ganglionic left frontal lobe. Decreased attenuation is present within the left temporal tip. There is no hemorrhage or mass lesion. Ventricles are normal size. No other acute infarct present. Vascular: Atherosclerotic calcifications are present at the cavernous internal carotid  arteries and left greater than right vertebral arteries without a hyperdense vessel. Skull: No focal lytic or blastic lesions are present. Sinuses/Orbits: Paranasal sinuses and mastoid air cells are clear. ASPECTS [facility] Stroke Program Early CT Score) - Ganglionic level infarction (caudate, lentiform nuclei, internal capsule, insula, M1-M3 cortex): 5/7 - Supraganglionic infarction (M4-M6 cortex): 2/3 Total score (0-10 with 10 being normal): 7/10 IMPRESSION: 1. Acute nonhemorrhagic infarct involving the left MCA territory with involvement of the left insular cortex, left frontal operculum, left temporal tip, and left super ganglionic frontal lobe. 2. ASPECTS is 7/10 These results were called by telephone at the time of interpretation on 10/15/2016 at 12:30 pm to Dr. Leonel Ramsay , who verbally acknowledged these results. Electronically Signed   By: San Morelle M.D.   On: 10/15/2016 12:33   Ir Angio Intra Extracran Sel Com Carotid Innominate Uni R Mod Sed  Result Date: 10/16/2016 INDICATION: Global aphasia. Right-sided weakness. CT perfusion examination revealed mismatch volume of 131 mL with the CBF < 30% volume of 43 mL, and a mismatch ratio of 4.0. Large near complete occlusive filling defect in the distal right middle cerebral artery extending into the bifurcation middle cerebral artery just proximal to the bifurcation and extending into the bifurcation. EXAM: 1. EMERGENT LARGE VESSEL OCCLUSION THROMBOLYSIS (anterior CIRCULATION) COMPARISON:  CT angiogram of 10/15/2016. MEDICATIONS: Ancef 2 g IV. The antibiotic was administered within 1 hour of the procedure. ANESTHESIA/SEDATION: General anesthesia. CONTRAST:  Isovue 300 approximately 60 mL. FLUOROSCOPY TIME:  Fluoroscopy Time: 10 minutes 12 seconds (837 mGy). COMPLICATIONS: None immediate. TECHNIQUE: Following a full explanation of the procedure along with the potential associated complications, an informed witnessed consent was obtained. The risks  of intracranial hemorrhage of 10%, worsening neurological deficit, ventilator dependency, death and inability to revascularize were  all reviewed in detail with the patient's wife. The patient was then put under general anesthesia by the Department of Anesthesiology at [facility]. The right groin was prepped and draped in the usual sterile fashion. Thereafter using modified Seldinger technique, transfemoral access into the right common femoral artery was obtained without difficulty. Over a 0.035 inch guidewire a 5 French Pinnacle sheath was inserted. Through this, and also over a 0.035 inch guidewire a 5 [place] JB 1 catheter was advanced to the aortic arch region and selectively positioned in the innominate artery and the left common carotid artery. FINDINGS: The innominate arteriogram demonstrates a long segment lobulated filling defect which extends from the mid innominate artery across the origin of the right common carotid artery and the right vertebral artery. No angiographic flow is noted in the right vertebral artery. However, there is flow noted in the right common carotid artery. The right common carotid artery on the lateral projection demonstrates wide patency at the right carotid artery bifurcation to the supraclinoid segment with opacification of the visualized right MCA distribution on the lateral projection. The left common carotid arteriogram demonstrates the origin of the left external carotid artery to be patent. The opacified portions of the left external carotid artery appear patent. The left internal carotid artery at the bulb to the cranial skull base demonstrates wide patency, with a small shelf-like plaque noted along the posterior wall of the left internal carotid artery at the distal aspect of the bulb. The left internal carotid artery is seen to opacify to the cranial skull base. The petrous segment is widely patent. There is a focal stenoses of approximately 30% of the caval  cavernous segment of the left internal carotid artery. Distal to this, the distal cavernous and the supraclinoid segments are widely patent. The left middle cerebral artery in its M1 segment demonstrates patency. There is attenuated caliber in the distal left M1 segment with near complete occlusion extending into the inferior division with near complete occlusion of the superior division. Multiple filling defects are seen in this region. The left anterior cerebral artery is seen to opacify normally into the capillary and venous phases. The delayed arterial phase demonstrates partial retrograde opacification of the perisylvian branches from the pericallosal and callosal marginal branches. Also noted is prompt opacification via the anterior communicating artery of the right anterior cerebral A2 segment and the right anterior cerebral A1 segment. Opacification of the right middle cerebral artery M1 segment and distally is also noted from the left common carotid artery injection. PROCEDURE: The diagnostic JB 1 catheter in the left common carotid artery was then exchanged over a 0.035 inch 300 cm Rosen exchange guidewire for a 55 cm 8 [place] Brite tip neurovascular sheath using biplane roadmap technique and constant fluoroscopic guidance. Good aspiration was obtained from the hub of the 8 French neurovascular sheath. This was then connected to continuous heparinized saline infusion. Over the [employer] guidewire, a 95 cm 8 [place] FlowGate balloon guide catheter which been prepped with 50% contrast and 50% heparinized saline infusion was then advanced and positioned in the left common carotid artery. The guidewire was removed. Good aspiration was obtained from the hub of the 8 [place] FlowGate guide catheter. A gentle constant injection demonstrated no evidence of spasms, dissections or of intraluminal filling defects. Over a 0.035 inch Roadrunner guidewire, using biplane roadmap technique and constant fluoroscopic  guidance, the 8 [place] FlowGate guide catheter was then advanced to the cervical petrous junction of the left internal  carotid artery. The guidewire was removed. Good aspiration was obtained from the hub of the 8 [place] FlowGate balloon guide catheter. A gentle contrast injection demonstrated no evidence spasms, dissections or of intraluminal filling defects. At this time, in a coaxial manner and with constant heparinized saline infusion using biplane roadmap technique and constant fluoroscopic guidance, a Trevo ProVue 021 microcatheter was advanced over a 0.014 inch Softip Synchro micro guidewire to the distal end of the 8 [place] FlowGate guide catheter. With the micro guidewire leading with a J-tip configuration, the combination was navigated without difficulty to the supraclinoid left ICA. A torque device was then utilized to advance the micro guidewire to the left middle cerebral artery followed by the microcatheter. The micro guidewire was then advanced without difficulty through the inferior division of left middle cerebral artery into the M2 M3 region followed by the microcatheter. The micro guidewire was removed. Good aspiration was obtained from the hub of the microcatheter. Gentle contrast injection demonstrated a brisk antegrade flow distally. A 4 mm x 40 mm Solitaire FR retrieval device was then purged with 50% contrast and 50% heparinized saline infusion. Thereafter this was advanced again in a coaxial manner and with constant heparinized saline infusion using biplane roadmap technique and constant fluoroscopic guidance to the distal end of the microcatheter. The O ring on the delivery microcatheter was then loosened. With slight forward gentle traction with the right hand on the delivery micro guidewire with the left hand the delivery microcatheter was retrieved unsheathing the distal end and then the proximal portion of the retrieval device. The tip of the microcatheter was just proximal to the  proximal portion of the retrieval device. A brisk control arteriogram performed through the 8 [place] FlowGate guide catheter in the left internal carotid artery demonstrated brisk flow through the inferior division and partially through the superior division though improved. A TICI 2b reperfusion was noted. The balloon in the distal left internal carotid artery FlowGate guide catheter was then inflated for proximal flow arrest. The proximal portion of the retrieval device was then captured into the microcatheter. There on after with constant aspiration being applied with a 60 mL syringe at the hub of the [facility] guide catheter, the combination of the retrieval device and the microcatheter were retrieved and removed. Aspiration was continued as the balloon was deflated. Free back bleed was noted at the hub of 8 [place] FlowGate guide catheter. The aspirate contained 2 chunks of clots. Also noted in the [employer] was a another piece of mixed bloody and fibrosis clot. A control arteriogram performed through the 8 [place] FlowGate guide catheter in the left internal carotid artery demonstrated complete angiographic revascularization of the occluded left middle cerebral artery distribution. No angiographic evidence of filling defects or occlusions or stenosis was seen. No evidence of extravasation, or mass-effect on the major vessel was noted. Moderate spasm was noted in the inferior division of the left middle cerebral artery which responded promptly to 2 aliquots of 25 mics of nitroglycerin given through the [facility] guide catheter. A final control arteriogram performed through the [facility] guide catheter in the left internal carotid artery demonstrated complete angiographic revascularization of the left MCA distribution. The left anterior cerebral artery appeared patent with brisk flow into the contralateral cerebral hemisphere as described previously. The patient's neurological status and hemodynamic status remained  stable throughout the procedure. The 8 [place] FlowGate guide catheter and the 8 [place] Brite tip neurovascular sheath were retrieved in the abdominal aorta and exchanged over  a J-tip guidewire for an 8 [place] Pinnacle sheath. This was then removed successfully with the application of an external closure device with compression at the puncture site for 20 minutes. The groin site appeared soft without evidence of a hematoma. The distal pulses remained stable palpable bilaterally and unchanged compared to prior to the procedure. IMPRESSION: Status post endovascular complete revascularization of left MCA occlusion with 1 pass with the Solitaire FR 4 mm x 40 mm retrieval device achieving a TICI 3 reperfusion. Groin puncture time 13:12. Revascularization with TICI 2b at 13:35. Revascularization with TICI 3 at 13:39. PLAN: Patient to CT scanner for postprocedural CT scan of brain. Electronically Signed   By: Luanne Bras M.D.   On: 10/15/2016 21:34   Assessment/Plan The patient is a 66 year old male with a past medical history of lung cancer (mets to bone), COPD, CKD, DM, HTN, HLD s/p CVA with right hematoma from excision of neck tumor - stable 1. Neck Hematoma: Non-infected. Is not interfering with breathing. We do not recommend surgical intervention at this time. If any drainage, erythema, pain, fever, nausea or vomiting - patient should follow up with surgical team from [facility].  2. Anticoagulation: OK to start Lovenox. I do not feel his hematoma is actively bleeding.  3. Palliative Care Consult: Unfortunate situation. Patient with metastatic cancer and CVA. Would benefit from palliative care.  Discussed with Dr. Mayme Genta, PA-C  11/02/2016 3:16 PM

## 2016-11-02 NOTE — Progress Notes (Signed)
Pt is being discharged home. Discharge papers given and explained to pt and son, both verbalized understanding. F/U appointment and meds reviewed with pt and son. RX given.

## 2016-11-02 NOTE — Progress Notes (Signed)
[place] [facility] [place] NAME: Kamali Nephew    MR#:  [id number]  DATE OF BIRTH:  20-Apr-1951  SUBJECTIVE:  CHIEF COMPLAINT:   Chief Complaint  Patient presents with  . Shortness of Breath    Hx of Lung cancer s/p chemoa nd radiation, recent stroke- and MCA revascularization with long hospital stay in [facility]- d/c 4 days ago- had vomiting and then in few hours very SOB- found to have b/l pneumonia and required Bipap initially, off on nasal canula now.  Also had A fib with RVR on presentation, but in NSR and off the drip   Comfortable on room air now, transferred to floor.  The patient and his wife noted a swelling on his right side of lower neck which is at the same side where he had vascular surgery 2 weeks ago at [place]:  CONSTITUTIONAL: No fever, fatigue or weakness.  EYES: No blurred or double vision.  EARS, NOSE, AND THROAT: No tinnitus or ear pain.  RESPIRATORY: No cough, shortness of breath, wheezing or hemoptysis.  CARDIOVASCULAR: No chest pain, orthopnea, edema.  GASTROINTESTINAL: No nausea, vomiting, diarrhea or abdominal pain.  GENITOURINARY: No dysuria, hematuria.  ENDOCRINE: No polyuria, nocturia,  HEMATOLOGY: No anemia, easy bruising or bleeding SKIN: No rash or lesion. MUSCULOSKELETAL: No joint pain or arthritis.   NEUROLOGIC: No tingling, numbness, weakness.  PSYCHIATRY: No anxiety or depression.   ROS  DRUG ALLERGIES:  No Known Allergies  VITALS:  Blood pressure (!) 179/94, pulse 96, temperature 97.9 F (36.6 C), temperature source Oral, resp. rate 18, height '5\' 7"'$  (1.702 m), weight 71.7 kg (158 lb 1.1 oz), SpO2 98 %.  PHYSICAL EXAMINATION:  GENERAL:  66 y.o.-year-old patient lying in the bed with no acute distress.  EYES: Pupils equal, round, reactive to light and accommodation. No scleral icterus. Extraocular muscles intact.  HEENT: Head atraumatic, normocephalic. Oropharynx and  nasopharynx clear.  NECK:  Supple, no jugular venous distention. No thyroid enlargement, no tenderness. A right-sided lower neck at the sternoclavicular junction there is a 6 cm x 4 cm size semisolid feeling, nontender, nonpulsatile mass which is at the same site where he has a scar of recent surgery. LUNGS: Normal breath sounds bilaterally, no wheezing, some crepitation. No use of accessory muscles of respiration. On nasal canula oxygen. CARDIOVASCULAR: S1, S2 normal. No murmurs, rubs, or gallops.  ABDOMEN: Soft, nontender, nondistended. Bowel sounds present. No organomegaly or mass.  EXTREMITIES: No pedal edema, cyanosis, or clubbing.  NEUROLOGIC: Cranial nerves II through XII are intact. Muscle strength 5/5 in all extremities. Sensation intact. Gait not checked.  PSYCHIATRIC: The patient is alert and oriented x 3.  SKIN: No obvious rash, lesion, or ulcer.   Physical Exam LABORATORY PANEL:   CBC  Recent Labs Lab 11/02/16 0451  WBC 12.2*  HGB 9.6*  HCT 28.0*  PLT 168   ------------------------------------------------------------------------------------------------------------------  Chemistries   Recent Labs Lab 10/27/16 0426  10/31/16 0419  11/02/16 0451  NA 135  < > 134*  < > 136  K 4.1  < > 4.6  < > 3.3*  CL 99*  < > 105  < > 102  CO2 29  < > 25  < > 26  GLUCOSE 127*  < > 184*  < > 167*  BUN 17  < > 18  < > 22*  CREATININE 1.03  < > 1.14  < > 0.84  CALCIUM 9.4  < >  9.1  < > 9.5  MG 2.0  --   --   --   --   AST  --   < > 24  --   --   ALT  --   < > 38  --   --   ALKPHOS  --   < > 51  --   --   BILITOT  --   < > 0.7  --   --   < > = values in this interval not displayed. ------------------------------------------------------------------------------------------------------------------  Cardiac Enzymes  Recent Labs Lab 10/30/16 1835  TROPONINI <0.03    ------------------------------------------------------------------------------------------------------------------  RADIOLOGY:  Ct Soft Tissue Neck W Contrast  Result Date: 11/02/2016 CLINICAL DATA:  66 year old male with widely metastatic lung cancer. Status post left MCA infarct and right subclavian/carotid thrombectomy 3 weeks ago. Neck mass. EXAM: CT NECK WITH CONTRAST TECHNIQUE: Multidetector CT imaging of the neck was performed using the standard protocol following the bolus administration of intravenous contrast. CONTRAST:  58m ISOVUE-300 IOPAMIDOL (ISOVUE-300) INJECTION 61% COMPARISON:  Pretreatment CTA head and neck 10/15/2016 FINDINGS: Pharynx and larynx: Larynx and pharynx contours remain normal. Negative parapharyngeal and retropharyngeal spaces. Salivary glands: Negative sublingual space, submandibular glands and parotid glands. Thyroid: Negative thyroid. Lymph nodes: There is no discrete cervical lymphadenopathy. There is stable mild right peritracheal lymphadenopathy in the upper chest measuring 8 mm (series 2, image 88). Vascular: Surgical clips are demonstrated along the proximal right subclavian and right common carotid arteries distal to the right brachiocephalic artery bifurcation which is on series 2, image 90. No residual proximal right common carotid or right subclavian artery stenosis or thrombus. Both the right CCA and right subclavian arteries are patent and have a normal configuration. However, there is a new large low to intermediate density (15-65 Hounsfield units) inverted-U-shaped mass occupying the lateral aspect of the right carotid space at the level of the superior thyroid and larynx, and extending laterally. See coronal images 51 through 55. The mass encompasses an area of 69 x 73 x 64 mm (AP by transverse by CC). There is severe mass effect on and effacement of the right IJ cava however the right IJ does remain patent (series 2, image 64). The large mass is also  inseparable from the deep aspect of the right sternocleidomastoid muscle. The mass does not appear to affect the right subclavian vein or artery. Major vascular structures in the neck and at the skullbase remain patent despite the above. Limited intracranial: Negative. Visualized orbits: Negative. Mastoids and visualized paranasal sinuses: Visualized paranasal sinuses and mastoids are stable and well pneumatized. Skeleton: Stable visualized osseous structures. Upper chest: The level of the large anterior right perihilar lung mass is not included. Decreased layering right pleural effusion. Partially visible irregular opacity in the lateral and posterior right upper lobe. Stable mild right superior peritracheal lymphadenopathy (8 mm short axis). Stable and negative visualized aortic arch except for calcified atherosclerosis. IMPRESSION: 1. 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 This at least partially occupies the right carotid space, and there is associated severe effacement of the right internal jugular vein which does remain patent. Although nonspecific I favor this is a large hematoma, and I suspect the right IJ is being compressed rather than partially thrombosed (i.e. no thrombus within the lumen of the IJ). I considered metastatic disease as the etiology of this mass, but feel that unlikely (see # 3). 2. The mass does abut the proximal right  CCA and right subclavian arteries, but those vessels maintain normal shape and patency. 3. No cervical lymphadenopathy, and no other neck mass. Stable mild right superior peritracheal lymphadenopathy. Electronically Signed   By: Genevie Ann M.D.   On: 11/02/2016 14:06    ASSESSMENT AND PLAN:   Active Problems:   Acute respiratory failure with hypoxia (HCC)   Atrial fibrillation with rapid ventricular response (HCC)   Malignant neoplasm of lung (HCC)   Acute respiratory failure in a patient with  known COPD Sepsis secondary to hospital-acquired pneumonia bilateral Atrial fibrillation with rapid ventricular response  possible aspiration pneumonia.  - IV antibiotics: Cefepime and vancomycin per pharmacy -Solu-Medrol taper to oral now. - IV fluid hydration - Initially BiPAP O2, then on nasal canula, now on room air. - Follow up blood,urine & sputum cultures - appreciated critical care help.  MRSA PCR negative- Stopped vanc.  #. History of hypertension -Hold metoprolol for now secondary to hypotension   Resumed as BP normal now.  Added amlodipine as BP is high now.  #. History of GERD -Continue Protonix and Zantac  #. History of BPH - Continue Flomax  #. History of diabetes - Continue Toujeo -Regular insulin sliding scale coverage every 4 hours  #. History of DVT/PE - Continue Lovenox 70 mg every 12  #. History of hyperlipidemia - Continue Lipitor  # Lung mass- extending to Pulm vein and left atreum   Pt had received chemo with Dr. Grayland Ormond, now as per CT- it is worse.   Pallaitive care consult.  # neck mass   CT neck done- it is hematoma at the recent vascular surgery site.   Called vascular consult.   We may have to stop lovenox.  All the records are reviewed and case discussed with Care Management/Social Workerr. Management plans discussed with the patient, family and they are in agreement.  CODE STATUS: Full.  TOTAL TIME TAKING CARE OF THIS PATIENT: 45 minutes.   Discussed with his wife in room and vascular surgeon on phone.  POSSIBLE D/C IN 1-2 DAYS, DEPENDING ON CLINICAL CONDITION.   Vaughan Basta M.D on 11/02/2016   Between 7am to 6pm - Pager - [phone]  After 6pm go to [email or web address] - password [email or web address] [facility]  Office  [phone]  CC: Primary care physician; Kathrine Haddock, NP  Note: This dictation was prepared with Dragon dictation along with smaller phrase technology. Any transcriptional errors  that result from this process are unintentional.

## 2016-11-02 NOTE — Discharge Summary (Signed)
[facility] NAME: Ralph Lopez    MR#:  [id number]  DATE OF BIRTH:  1951/05/01  DATE OF ADMISSION:  10/30/2016 ADMITTING PHYSICIAN: Harvie Bridge, DO  DATE OF DISCHARGE: 11/02/2016  PRIMARY CARE PHYSICIAN: Kathrine Haddock, NP    ADMISSION DIAGNOSIS:  Atrial fibrillation with rapid ventricular response (HCC) [I48.91] Acute respiratory failure with hypoxia (HCC) [J96.01] Malignant neoplasm of lung, unspecified laterality, unspecified part of lung (HCC) [C34.90] Sepsis, due to unspecified organism ([place]) [A41.9]  DISCHARGE DIAGNOSIS:  Active Problems:   Acute respiratory failure with hypoxia (HCC)   Atrial fibrillation with rapid ventricular response (HCC)   Malignant neoplasm of lung (HCC)   Hematoma on neck  SECONDARY DIAGNOSIS:   Past Medical History:  Diagnosis Date  . Anxiety   . Cancer ([place])    Stage IV Lung  . Chronic kidney disease   . COPD (chronic obstructive pulmonary disease) ([place])   . Depression   . Diabetes mellitus without complication ([place])   . Hyperlipidemia   . Hypertension     HOSPITAL COURSE:   Acute respiratory failure in a patient with known COPD Sepsis secondary to hospital-acquired pneumonia bilateral Atrial fibrillation with rapid ventricular response  possible aspiration pneumonia.  - IV antibiotics: Cefepime and vancomycin per pharmacy -Solu-Medrol taper to oral now. - IV fluid hydration - Initially BiPAP O2, then on nasal canula, now on room air. - Follow up blood,urine & sputum cultures - appreciated critical care help.  MRSA PCR negative- Stopped vanc.  oral augmentin to finish course.  #. History of hypertension -Hold metoprolol for now secondary to hypotension   Resumed as BP normal now.  Added amlodipine as BP is high now.  #. History of GERD -Continue Protonix and Zantac  #. History of BPH - Continue Flomax  #. History of diabetes - Continue  Toujeo -Regular insulin sliding scale coverage every 4 hours  #. History of DVT/PE - Continue Lovenox 70 mg every 12  #. History of hyperlipidemia - Continue Lipitor  # Lung mass- extending to Pulm vein and left atreum   Pt had received chemo with Dr. Grayland Ormond, now as per CT- it is worse.   Pallaitive care consult.  # neck mass   CT neck done- it is hematoma at the recent vascular surgery site.   Called vascular consult.   No interventions as per them due to no acute complications from hematoma, approved to continue lovenox and advised to follow with palliative care.  DISCHARGE CONDITIONS:   Stable.  CONSULTS OBTAINED:  Treatment Team:  Algernon Huxley, MD Sela Hua, PA-C  DRUG ALLERGIES:  No Known Allergies  DISCHARGE MEDICATIONS:   Current Discharge Medication List    START taking these medications   Details  amLODipine (NORVASC) 5 MG tablet Take 1 tablet (5 mg total) by mouth daily. Qty: 30 tablet, Refills: 0    amoxicillin-clavulanate (AUGMENTIN) 875-125 MG tablet Take 1 tablet by mouth 2 (two) times daily. Qty: 10 tablet, Refills: 0      CONTINUE these medications which have NOT CHANGED   Details  albuterol (PROVENTIL HFA;VENTOLIN HFA) 108 (90 Base) MCG/ACT inhaler Inhale 2 puffs into the lungs every 6 (six) hours as needed for wheezing or shortness of breath.    atorvastatin (LIPITOR) 10 MG tablet Take 10 mg by mouth daily.    BYDUREON 2 MG PEN INJECT '2MG'$  SUBCUTANEOUSLY WEEKLY Qty: 4 each, Refills: 2    enoxaparin (LOVENOX) 80  MG/0.8ML injection Inject 0.7 mLs (70 mg total) into the skin every 12 (twelve) hours. Qty: 60 Syringe, Refills: 2    feeding supplement, ENSURE ENLIVE, (ENSURE ENLIVE) LIQD Take 237 mLs by mouth 2 (two) times daily between meals. Qty: 237 mL, Refills: 12    Insulin Glargine (TOUJEO SOLOSTAR) 300 UNIT/ML SOPN Inject 30 Units as directed daily. Qty: 4 pen, Refills: 6    ipratropium-albuterol (DUONEB) 0.5-2.5 (3)  MG/3ML SOLN Take 3 mLs by nebulization 3 (three) times daily. Qty: 360 mL, Refills: 0    LORazepam (ATIVAN) 0.5 MG tablet TAKE 1 TABLET BY MOUTH TWICE DAILY Qty: 60 tablet, Refills: 0    metoprolol tartrate (LOPRESSOR) 25 MG tablet Take 1 tablet (25 mg total) by mouth 3 (three) times daily. Qty: 90 tablet, Refills: 2    ondansetron (ZOFRAN) 8 MG tablet Take 1 tablet by mouth 2 (two) times daily as needed.    oxyCODONE (OXYCONTIN) 10 mg 12 hr tablet Take 1 tablet (10 mg total) by mouth every 12 (twelve) hours. Qty: 60 tablet, Refills: 0    Oxycodone HCl 10 MG TABS Take 0.5-1 tablets (5-10 mg total) by mouth every 4 (four) hours as needed. Qty: 90 tablet, Refills: 0    pantoprazole (PROTONIX) 40 MG tablet Take 1 tablet (40 mg total) by mouth daily. Qty: 90 tablet, Refills: 3    predniSONE (DELTASONE) 10 MG tablet Take 4 tablets (40 mg total) by mouth daily with breakfast. Taper dose: '40mg'$  x 2 days, 30 mg x 2 days, '20mg'$  x 2 days, 10 mg x 2 days, 5 mg (1/2 tab) x 2 days then stop Qty: 21 tablet, Refills: 0    ranitidine (ZANTAC) 150 MG tablet Take 150 mg by mouth daily as needed for heartburn.     tamsulosin (FLOMAX) 0.4 MG CAPS capsule Take 1 capsule (0.4 mg total) by mouth daily. Qty: 30 capsule, Refills: 2         DISCHARGE INSTRUCTIONS:    Follow with cancer center in one week.  If you experience worsening of your admission symptoms, develop shortness of breath, life threatening emergency, suicidal or homicidal thoughts you must seek medical attention immediately by calling 911 or calling your MD immediately  if symptoms less severe.  You Must read complete instructions/literature along with all the possible adverse reactions/side effects for all the Medicines you take and that have been prescribed to you. Take any new Medicines after you have completely understood and accept all the possible adverse reactions/side effects.   Please note  You were cared for by a hospitalist  during your hospital stay. If you have any questions about your discharge medications or the care you received while you were in the hospital after you are discharged, you can call the unit and asked to speak with the hospitalist on call if the hospitalist that took care of you is not available. Once you are discharged, your primary care physician will handle any further medical issues. Please note that NO REFILLS for any discharge medications will be authorized once you are discharged, as it is imperative that you return to your primary care physician (or establish a relationship with a primary care physician if you do not have one) for your aftercare needs so that they can reassess your need for medications and monitor your lab values.    Today   CHIEF COMPLAINT:   Chief Complaint  Patient presents with  . Shortness of Breath    HISTORY OF PRESENT ILLNESS:  Ralph Lopez  is a 66 y.o. 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    Patient has had a comp. Recent medical history including a hospitalization at [facility] for acute left MCA stroke which was revascularized. Upon extubation from the procedure he had a respiratory arrest and was reintubated. A prolonged stay in the ICU with complications including chest tube. He was recently discharged home where he is receiving home physical therapy and rehabilitation. His wife states that this morning and one episode of vomiting about 6 hours later he developed acute onset shortness of breath which was not responding to nebulizer therapy. She brought him to the emergency department where he is on BiPAP. He was found to meet sepsis criteria with possibility of bilateral lower lobe pneumonia. He was also found to be in A. fib with rapid ventricular response for which she was placed on a Cardizem drip  with good response.  Other than shortness of breath, nausea and vomiting patient denies any other complaints including fevers and chills. He states that he feels significantly better.   Otherwise there has been no change in status. Patient has been taking medication as prescribed and there has been no recent change in medication or diet.  No recent antibiotics.  There has been no recent  travel or sick contacts.    EMS/ED Course: Patient received 2 L of IV fluids, cefepime, vancomycin, Cardizem drip.   VITAL SIGNS:  Blood pressure (!) 179/94, pulse 96, temperature 97.9 F (36.6 C), temperature source Oral, resp. rate 18, height '5\' 7"'$  (1.702 m), weight 71.7 kg (158 lb 1.1 oz), SpO2 98 %.  I/O:   Intake/Output Summary (Last 24 hours) at 11/02/16 1651 Last data filed at 11/02/16 1418  Gross per 24 hour  Intake          2020.25 ml  Output              400 ml  Net          1620.25 ml    PHYSICAL EXAMINATION:   GENERAL:  66 y.o.-year-old patient lying in the bed with no acute distress.  EYES: Pupils equal, round, reactive to light and accommodation. No scleral icterus. Extraocular muscles intact.  HEENT: Head atraumatic, normocephalic. Oropharynx and nasopharynx clear.  NECK:  Supple, no jugular venous distention. No thyroid enlargement, no tenderness. A right-sided lower neck at the sternoclavicular junction there is a 6 cm x 4 cm size semisolid feeling, nontender, nonpulsatile mass which is at the same site where he has a scar of recent surgery. LUNGS: Normal breath sounds bilaterally, no wheezing, some crepitation. No use of accessory muscles of respiration. On nasal canula oxygen. CARDIOVASCULAR: S1, S2 normal. No murmurs, rubs, or gallops.  ABDOMEN: Soft, nontender, nondistended. Bowel sounds present. No organomegaly or mass.  EXTREMITIES: No pedal edema, cyanosis, or clubbing.  NEUROLOGIC: Cranial nerves II through XII are intact. Muscle strength 5/5 in all extremities. Sensation  intact. Gait not checked.  PSYCHIATRIC: The patient is alert and oriented x 3.  SKIN: No obvious rash, lesion, or ulcer.   DATA REVIEW:   CBC  Recent Labs Lab 11/02/16 0451  WBC 12.2*  HGB 9.6*  HCT 28.0*  PLT 168    Chemistries   Recent Labs Lab 10/27/16 0426  10/31/16 0419  11/02/16 0451  NA 135  < > 134*  < >  136  K 4.1  < > 4.6  < > 3.3*  CL 99*  < > 105  < > 102  CO2 29  < > 25  < > 26  GLUCOSE 127*  < > 184*  < > 167*  BUN 17  < > 18  < > 22*  CREATININE 1.03  < > 1.14  < > 0.84  CALCIUM 9.4  < > 9.1  < > 9.5  MG 2.0  --   --   --   --   AST  --   < > 24  --   --   ALT  --   < > 38  --   --   ALKPHOS  --   < > 51  --   --   BILITOT  --   < > 0.7  --   --   < > = values in this interval not displayed.  Cardiac Enzymes  Recent Labs Lab 10/30/16 1835  TROPONINI <0.03    Microbiology Results  Results for orders placed or performed during the hospital encounter of 10/30/16  MRSA PCR Screening     Status: None   Collection Time: 10/30/16 11:55 PM  Result Value Ref Range Status   MRSA by PCR NEGATIVE NEGATIVE Final    Comment:        The GeneXpert MRSA Assay (FDA approved for NASAL specimens only), is one component of a comprehensive MRSA colonization surveillance program. It is not intended to diagnose MRSA infection nor to guide or monitor treatment for MRSA infections.     RADIOLOGY:  Ct Soft Tissue Neck W Contrast  Result Date: 11/02/2016 CLINICAL DATA:  66 year old male with widely metastatic lung cancer. Status post left MCA infarct and right subclavian/carotid thrombectomy 3 weeks ago. Neck mass. EXAM: CT NECK WITH CONTRAST TECHNIQUE: Multidetector CT imaging of the neck was performed using the standard protocol following the bolus administration of intravenous contrast. CONTRAST:  65m ISOVUE-300 IOPAMIDOL (ISOVUE-300) INJECTION 61% COMPARISON:  Pretreatment CTA head and neck 10/15/2016 FINDINGS: Pharynx and larynx: Larynx and pharynx  contours remain normal. Negative parapharyngeal and retropharyngeal spaces. Salivary glands: Negative sublingual space, submandibular glands and parotid glands. Thyroid: Negative thyroid. Lymph nodes: There is no discrete cervical lymphadenopathy. There is stable mild right peritracheal lymphadenopathy in the upper chest measuring 8 mm (series 2, image 88). Vascular: Surgical clips are demonstrated along the proximal right subclavian and right common carotid arteries distal to the right brachiocephalic artery bifurcation which is on series 2, image 90. No residual proximal right common carotid or right subclavian artery stenosis or thrombus. Both the right CCA and right subclavian arteries are patent and have a normal configuration. However, there is a new large low to intermediate density (15-65 Hounsfield units) inverted-U-shaped mass occupying the lateral aspect of the right carotid space at the level of the superior thyroid and larynx, and extending laterally. See coronal images 51 through 55. The mass encompasses an area of 69 x 73 x 64 mm (AP by transverse by CC). There is severe mass effect on and effacement of the right IJ cava however the right IJ does remain patent (series 2, image 64). The large mass is also inseparable from the deep aspect of the right sternocleidomastoid muscle. The mass does not appear to affect the right subclavian vein or artery. Major vascular structures in the neck and at the skullbase remain patent despite the above. Limited intracranial: Negative. Visualized orbits: Negative. Mastoids and visualized  paranasal sinuses: Visualized paranasal sinuses and mastoids are stable and well pneumatized. Skeleton: Stable visualized osseous structures. Upper chest: The level of the large anterior right perihilar lung mass is not included. Decreased layering right pleural effusion. Partially visible irregular opacity in the lateral and posterior right upper lobe. Stable mild right superior  peritracheal lymphadenopathy (8 mm short axis). Stable and negative visualized aortic arch except for calcified atherosclerosis. IMPRESSION: 1. 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 This at least partially occupies the right carotid space, and there is associated severe effacement of the right internal jugular vein which does remain patent. Although nonspecific I favor this is a large hematoma, and I suspect the right IJ is being compressed rather than partially thrombosed (i.e. no thrombus within the lumen of the IJ). I considered metastatic disease as the etiology of this mass, but feel that unlikely (see # 3). 2. The mass does abut the proximal right CCA and right subclavian arteries, but those vessels maintain normal shape and patency. 3. No cervical lymphadenopathy, and no other neck mass. Stable mild right superior peritracheal lymphadenopathy. Electronically Signed   By: Genevie Ann M.D.   On: 11/02/2016 14:06    EKG:   Orders placed or performed during the hospital encounter of 10/30/16  . ED EKG  . ED EKG  . EKG 12-Lead  . EKG 12-Lead  . EKG 12-Lead      Management plans discussed with the patient, family and they are in agreement.  CODE STATUS:     Code Status Orders        Start     Ordered   10/30/16 2350  Full code  Continuous     10/30/16 2350    Code Status History    Date Active Date Inactive Code Status Order ID Comments User Context   10/15/2016  3:42 PM 10/15/2016  3:42 PM Full Code [id number]  Greta Doom, MD Inpatient   10/15/2016  3:42 PM 10/27/2016  8:21 PM Full Code [id number]  Luanne Bras, MD Inpatient      TOTAL TIME TAKING CARE OF THIS PATIENT: 35 minutes.    Vaughan Basta M.D on 11/02/2016 at 4:51 PM  Between 7am to 6pm - Pager - [phone]  After 6pm go to [email or web address] - password [email or web address] [place]  Office  [phone]  CC: Primary  care physician; Kathrine Haddock, NP   Note: This dictation was prepared with Dragon dictation along with smaller phrase technology. Any transcriptional errors that result from this process are unintentional.

## 2016-11-03 ENCOUNTER — Inpatient Hospital Stay: Payer: BLUE CROSS/BLUE SHIELD | Admitting: Unknown Physician Specialty

## 2016-11-03 ENCOUNTER — Other Ambulatory Visit: Payer: Self-pay

## 2016-11-03 ENCOUNTER — Telehealth: Payer: Self-pay | Admitting: Family Medicine

## 2016-11-03 ENCOUNTER — Other Ambulatory Visit: Payer: Self-pay | Admitting: *Deleted

## 2016-11-03 NOTE — Telephone Encounter (Signed)
I only have as much information as she does.  I would continue whatever care he was getting prior to the last hospitalization unless they have coordinated with hospice care

## 2016-11-03 NOTE — Telephone Encounter (Signed)
Not a [facility] pt, sees [facility] [facility].

## 2016-11-03 NOTE — Telephone Encounter (Signed)
Called and spoke with [place]. She stated that the patient was discharged on the 13th with no orders for home health. Jacqlyn Larsen stated that she is worried about what benefit the patient would get from PT. She also questioned if patient is being discharged to hospice. She stated that she has the patient's discharge summary and that she sees that the patient is having or had a palliative care consult. Jacqlyn Larsen states that she is concerned about the benefits of home health for the patient. I told Jacqlyn Larsen that I would have to send this to Malachy Mood to see what she said and she requested a call back today on the patient and how she should move forward with him. Her callback is [phone].

## 2016-11-03 NOTE — Patient Outreach (Signed)
[facility]) Care Management  11/03/2016  Ralph Lopez Apr 16, 1951 [id number]  EMMI stroke Referral date; 11/03/16 Referral source: EMMI stroke red alert Referral reason: Feeling worse overall: YES, New or worsening pain/ fever/ shortness of breath: YES  Telephone call to patient regarding EMMI stroke red alert. Contact answering phone identified herself as patients wife. States she will have to speak for patient due to patient having a recent stroke and is unable to talk for himself. Wife verified HIPAA for patient. Wife states patient was discharged from the hospital on yesterday. Wife states patient is having cancer pelvic pain and will have follow up with oncologist on 11/05/16. Wife states she will be calling to schedule patients follow up with his primary MD. Wife states patient has all of his medications and is taking them as prescribed. Wife states patient is having home therapy services with [facility]. Wife states she is expecting a call from the hospice program. Wife denies any further needs at this time for patient. Wife states she knows which doctor to call if patient has additional problems.  Wife states she is aware of contacting emergency medical services if needed.   PLAN; RNCM will refer patient to care management assistant to close due refusal of services.   Quinn Plowman RN,BSN,CCM [facility] Telephonic  [phone]

## 2016-11-03 NOTE — Telephone Encounter (Signed)
Called and let Becky know what Malachy Mood said. Jacqlyn Larsen stated that she was going to call and speak with the patient as to what to do or where to go from here.

## 2016-11-03 NOTE — Telephone Encounter (Signed)
Becky from [facility] called and needs to speak to [facility] ASAP regarding this patient.  He has recently been released from hospital and they need some advice on what to do with patient.  Jacqlyn Larsen  [phone]  Thanks

## 2016-11-04 NOTE — Progress Notes (Signed)
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  INTERVAL HISTORY: Patient returns to clinic today for further evaluation and hospital follow-up. He is had multiple hospital admissions initially for CVA and then more recently for sepsis and acute respiratory failure. 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 He continues to have significant weakness and fatigue. He is having significant back pain that is radiating down his leg likely secondary to his known metastatic disease. He has a fair appetite and denies weight loss.  He reports mild shortness of breath with exertion.  He denies any chest pain. He denies any nausea, vomiting, diarrhea, hematuria, or hematochezia.  He has no urinary complaints. Patient offers no further specific complaints.  REVIEW OF SYSTEMS:   Review of Systems  Constitutional: Positive for malaise/fatigue. Negative for chills, fever and weight loss.  HENT: Negative for congestion and hearing loss.   Respiratory: Positive for cough and shortness of breath. Negative for hemoptysis.   Cardiovascular: Negative.  Negative for chest pain and leg swelling.  Gastrointestinal: Positive for constipation. Negative for abdominal pain, blood in stool, diarrhea, nausea and vomiting.  Genitourinary: Negative.  Negative for frequency, hematuria and urgency.  Musculoskeletal: Positive for back pain and joint pain. Negative for myalgias.  Neurological: Positive for weakness. Negative for tingling, sensory change and headaches.  Psychiatric/Behavioral: The patient is not nervous/anxious and does not have insomnia.    As per HPI. Otherwise, a complete  review of systems is negative.   PAST MEDICAL HISTORY: Past Medical History:  Diagnosis Date  . Anxiety   . Cancer ([place])    Stage IV Lung  . Chronic kidney disease   . COPD (chronic obstructive pulmonary disease) ([place])   . Depression   . Diabetes mellitus without complication ([place])   . Hyperlipidemia   . Hypertension     PAST SURGICAL HISTORY: Past Surgical History:  Procedure Laterality Date  . CAROTID-SUBCLAVIAN BYPASS GRAFT Right 10/16/2016   Procedure: RIGHT CAROTID-SUBCLAVIAN THROMBECTOMY;  Surgeon: Waynetta Sandy, MD;  Location: [place];  Service: Vascular;  Laterality: Right;  . EYE SURGERY    . FLEXIBLE BRONCHOSCOPY N/A 04/28/2016   Procedure: FLEXIBLE BRONCHOSCOPY;  Surgeon: [name], MD;  Location: [facility];  Service: Pulmonary;  Laterality: N/A;  . IR GENERIC HISTORICAL  10/15/2016   IR PERCUTANEOUS ART THROMBECTOMY/INFUSION INTRACRANIAL INC DIAG ANGIO 10/15/2016 Luanne Bras, MD [facility]  . IR GENERIC HISTORICAL  10/15/2016   IR ANGIOGRAM EXTREMITY RIGHT 10/15/2016 Luanne Bras, MD [facility]  . IR GENERIC HISTORICAL  10/15/2016   IR ANGIO INTRA EXTRACRAN SEL COM CAROTID INNOMINATE UNI R MOD SED 10/15/2016 Luanne Bras, MD [facility]  . MANDIBLE SURGERY    . RADIOLOGY WITH ANESTHESIA N/A 10/15/2016   Procedure: RADIOLOGY WITH ANESTHESIA;  Surgeon: Luanne Bras, MD;  Location: [facility];  Service: Radiology;  Laterality: N/A;    FAMILY HISTORY: Family History  Problem Relation Age of Onset  . Diabetes Mother   . Hypertension Mother   . Breast cancer Mother   . Thrombosis Mother   . Heart disease Father     MI  . Breast cancer Sister   . Colon cancer Brother   .  Lung cancer Brother     ADVANCED DIRECTIVES (Y/N):  N  HEALTH MAINTENANCE: Social History  Substance Use Topics  . Smoking status: Former Smoker    Years: 20.00    Quit date: 04/22/2014  . Smokeless tobacco: Never Used  . Alcohol use No      Colonoscopy:  PAP:  Bone density:  Lipid panel:  No Known Allergies  Current Outpatient Prescriptions  Medication Sig Dispense Refill  . albuterol (PROVENTIL HFA;VENTOLIN HFA) 108 (90 Base) MCG/ACT inhaler Inhale 2 puffs into the lungs every 6 (six) hours as needed for wheezing or shortness of breath.    Marland Kitchen amLODipine (NORVASC) 5 MG tablet Take 1 tablet (5 mg total) by mouth daily. 30 tablet 0  . atorvastatin (LIPITOR) 10 MG tablet Take 10 mg by mouth daily.    Marland Kitchen BYDUREON 2 MG PEN INJECT '2MG'$  SUBCUTANEOUSLY WEEKLY 4 each 2  . enoxaparin (LOVENOX) 80 MG/0.8ML injection Inject 0.7 mLs (70 mg total) into the skin every 12 (twelve) hours. 60 Syringe 2  . feeding supplement, ENSURE ENLIVE, (ENSURE ENLIVE) LIQD Take 237 mLs by mouth 2 (two) times daily between meals. 237 mL 12  . Insulin Glargine (TOUJEO SOLOSTAR) 300 UNIT/ML SOPN Inject 30 Units as directed daily. 4 pen 6  . ipratropium-albuterol (DUONEB) 0.5-2.5 (3) MG/3ML SOLN Take 3 mLs by nebulization 3 (three) times daily. 360 mL 0  . LORazepam (ATIVAN) 0.5 MG tablet TAKE 1 TABLET BY MOUTH TWICE DAILY 60 tablet 0  . metoprolol tartrate (LOPRESSOR) 25 MG tablet Take 1 tablet (25 mg total) by mouth 3 (three) times daily. 90 tablet 2  . ondansetron (ZOFRAN) 8 MG tablet Take 1 tablet by mouth 2 (two) times daily as needed.    Marland Kitchen oxyCODONE (OXYCONTIN) 10 mg 12 hr tablet Take 1 tablet (10 mg total) by mouth every 12 (twelve) hours. 60 tablet 0  . Oxycodone HCl 10 MG TABS Take 0.5-1 tablets (5-10 mg total) by mouth every 4 (four) hours as needed. 90 tablet 0  . pantoprazole (PROTONIX) 40 MG tablet Take 1 tablet (40 mg total) by mouth daily. 90 tablet 3  . predniSONE (DELTASONE) 10 MG tablet Take 4 tablets (40 mg total) by mouth daily with breakfast. Taper dose: '40mg'$  x 2 days, 30 mg x 2 days, '20mg'$  x 2 days, 10 mg x 2 days, 5 mg (1/2 tab) x 2 days then stop 21 tablet 0  . ranitidine (ZANTAC) 150 MG tablet Take 150 mg by mouth daily as needed for  heartburn.     . tamsulosin (FLOMAX) 0.4 MG CAPS capsule Take 1 capsule (0.4 mg total) by mouth daily. 30 capsule 2  . fentaNYL (DURAGESIC - DOSED MCG/HR) 25 MCG/HR patch Place 1 patch (25 mcg total) onto the skin every 3 (three) days. 10 patch 0   No current facility-administered medications for this visit.     OBJECTIVE: Vitals:   11/05/16 1034  BP: 134/84  Pulse: (!) 116  Resp: 18  Temp: 97.6 F (36.4 C)     Body mass index is 24.1 kg/m.    ECOG FS:2 - Symptomatic, <50% confined to bed  General: Well-developed, well-nourished, no acute distress. Sitting in a wheelchair. Eyes: Pink conjunctiva, anicteric sclera. Lungs: Clear to auscultation bilaterally. Heart: Regular rate and rhythm. No rubs, murmurs, or gallops. Abdomen: Soft, nontender, nondistended. No organomegaly noted, normoactive bowel sounds. Musculoskeletal: No edema, cyanosis, or clubbing. Neuro: Alert, answering all questions appropriately. Cranial nerves grossly intact. Skin: No rashes or petechiae  noted. Psych: Normal affect.   LAB RESULTS:  Lab Results  Component Value Date   NA 134 (L) 11/05/2016   K 3.2 (L) 11/05/2016   CL 96 (L) 11/05/2016   CO2 29 11/05/2016   GLUCOSE 207 (H) 11/05/2016   BUN 18 11/05/2016   CREATININE 1.05 11/05/2016   CALCIUM 9.5 11/05/2016   PROT 6.2 (L) 11/05/2016   ALBUMIN 2.9 (L) 11/05/2016   AST 18 11/05/2016   ALT 18 11/05/2016   ALKPHOS 73 11/05/2016   BILITOT 1.0 11/05/2016   GFRNONAA >60 11/05/2016   GFRAA >60 11/05/2016    Lab Results  Component Value Date   WBC 14.8 (H) 11/05/2016   NEUTROABS 13.5 (H) 11/05/2016   HGB 9.6 (L) 11/05/2016   HCT 28.9 (L) 11/05/2016   MCV 88.6 11/05/2016   PLT 160 11/05/2016     STUDIES: Ct Angio Head W Or Wo Contrast  Result Date: 10/15/2016 CLINICAL DATA:  Acute onset right-sided weakness and abnormal speech. EXAM: CT ANGIOGRAPHY HEAD AND NECK TECHNIQUE: Multidetector CT imaging of the head and neck was performed using  the standard protocol during bolus administration of intravenous contrast. Multiplanar CT image reconstructions and MIPs were obtained to evaluate the vascular anatomy. Carotid stenosis measurements (when applicable) are obtained utilizing NASCET criteria, using the distal internal carotid diameter as the denominator. CONTRAST:  90 mL Isovue 370 COMPARISON:  MRI brain 05/14/2016 FINDINGS: CTA NECK FINDINGS Aortic arch: Atherosclerotic calcifications are present at the aortic arch without compromise of the great vessel origins. There is no aneurysm. Right carotid system: The a saddle embolus is present at bifurcation of the innominate artery and to the right common carotid artery and subclavian artery. This is nonocclusive. There is some nonocclusive thrombus extending more superiorly within the right common carotid artery is well. Atherosclerotic changes are present at the right carotid bifurcation without significant stenosis. Left carotid system: The left common carotid artery is within normal limits. Atherosclerotic changes are present at left carotid bifurcation. There is calcified plaque proximally. Noncalcified posterior plaque more distally narrows the with 3 mm. This compares with a distal measurement of 4.5 mm. Posterior calcification just below the skullbase may reflect a remote injury. There is no associated stenosis. Vertebral arteries: The vertebral arteries both originate from the subclavian arteries. There is no focal stenosis of the origin. The left vertebral artery the dominant vessel. There is no significant stenosis cannot air vertebral artery in the. Dense atherosclerotic calcifications narrowing of the left vertebral artery limiting to 1.5 mm. Skeleton: Vertebral body heights and alignment are normal. There is no focal lytic or blastic lesion. The patient is edentulous. Other neck: No significant cervical adenopathy is present. No focal mucosal or submucosal lesions are present. The  submandibular and parotid glands are within normal limits bilaterally. Upper chest: A medial right upper lobe chest mass decreased in size from the previous PET scan. The lesion measures 6.0 x 7.3 cm. There is postobstructive pneumonitis in the right upper lobe. A right pleural effusion is present. Ill-defined opacity in the superior segment of the left lower lobe is incompletely imaged. Review of the MIP images confirms the above findings CTA HEAD FINDINGS Anterior circulation: Atherosclerotic calcifications are present in precavernous cavernous internal carotid arteries bilaterally without a significant stenosis relative to the more distal vessel. The terminus is intact bilaterally. The A1 segments are normal. The right M1 segment is normal. A nonocclusive thrombus is present in the distal left M1 segment at the MCA bifurcation. MCA branch  vessels opacify the. They are of decreased caliber compared to the MCA branch vessels on the right. The area some atherosclerotic irregularity a MCA branch vessels. No focal occlusion is evident. Increased collaterals are evident on the left. Posterior circulation: Moderate stenosis is present in the dominant left vertebral artery at the dural margin. PICA origins are visualized and normal. The basilar artery is normal. Both posterior cerebral arteries originate from basilar tip. There is mild irregularity without focal stenosis or occlusion. Venous sinuses: Dural sinuses are patent. The left transverse sinus dominant. Anatomic variants: None Delayed phase: 2 punctate areas of enhancement in the right frontal lobe from previous MRI are not seen on today's study. No areas of enhancement are evident on the source images. Review of the MIP images confirms the above findings IMPRESSION: 1. Thrombus in the distal left M1 segment to the bifurcation. Flow is present in reduced caliber left MCA branch vessels. The thrombus is likely nonocclusive. Collateral vessels are also present. 2.  Saddle embolus at the innominate artery bifurcation into the right common carotid artery and right subclavian artery. This embolus is nonocclusive. 3. Atherosclerotic changes at the carotid bifurcations and cavernous internal carotid arteries bilaterally without significant stenosis. 4. Atherosclerotic calcifications with moderate stenosis in the proximal left vertebral artery. These results were called by telephone at the time of interpretation on 10/15/2016 at 12:30 pm to Dr. Leonel Ramsay , who verbally acknowledged these results. Electronically Signed   By: San Morelle M.D.   On: 10/15/2016 13:05   Dg Chest 2 View  Result Date: 11/06/2016 CLINICAL DATA:  Two weeks of productive cough. Known lung malignancy. EXAM: CHEST  2 VIEW COMPARISON:  CT scan chest of October 30, 2016 FINDINGS: There remains volume loss on the right consistent with pleural effusion. A large right hilar mass persists. Large rounded parenchymal masses are present in the right lung and in the left mid and lower lung. There is a trace of pleural fluid on the left. There is no pneumothorax. The heart and pulmonary vascularity are normal. There is calcification in the wall of the aortic arch. IMPRESSION: There remains a large right hilar mass. Multiple bilateral pulmonary parenchymal masses persist. A moderate-sized right and small left pleural effusion are present. No CHF nor pneumonia. Electronically Signed   By: Graylon  Martinique M.D.   On: 11/06/2016 13:02   Ct Head Wo Contrast  Result Date: 10/15/2016 CLINICAL DATA:  Stroke.  Post endovascular revascularization EXAM: CT HEAD WITHOUT CONTRAST TECHNIQUE: Contiguous axial images were obtained from the base of the skull through the vertex without intravenous contrast. COMPARISON:  CT head 10/07/2016 FINDINGS: Brain: Improvement in hypodensity in the left MCA territory compared with the earlier scan. There remains hypodensity in the left inferior temporal lobe extending into the insular  cortex compatible with infarction. There is edema effacing the sylvian fissure on the left. Low-density in the frontal operculum has significantly improved as has the edema in the insular cortex. Negative for acute hemorrhage. Ventricle size normal. 2 mm midline shift to the right due to edema. Vascular: Normal arterial and venous enhancement due to prior cerebral angiogram. Skull: Negative Sinuses/Orbits: Mild mucosal edema paranasal sinuses. Other: None IMPRESSION: Improvement in left MCA hypodensity following clot retrieval. There remains low-density in the left inferior temporal lobe and insular cortex with edema which has improved since earlier today. No acute hemorrhage.  Mild midline shift to the right. Electronically Signed   By: Franchot Gallo M.D.   On: 10/15/2016 14:50  Ct Soft Tissue Neck W Contrast  Result Date: 11/02/2016 CLINICAL DATA:  66 year old male with widely metastatic lung cancer. Status post left MCA infarct and right subclavian/carotid thrombectomy 3 weeks ago. Neck mass. EXAM: CT NECK WITH CONTRAST TECHNIQUE: Multidetector CT imaging of the neck was performed using the standard protocol following the bolus administration of intravenous contrast. CONTRAST:  53m ISOVUE-300 IOPAMIDOL (ISOVUE-300) INJECTION 61% COMPARISON:  Pretreatment CTA head and neck 10/15/2016 FINDINGS: Pharynx and larynx: Larynx and pharynx contours remain normal. Negative parapharyngeal and retropharyngeal spaces. Salivary glands: Negative sublingual space, submandibular glands and parotid glands. Thyroid: Negative thyroid. Lymph nodes: There is no discrete cervical lymphadenopathy. There is stable mild right peritracheal lymphadenopathy in the upper chest measuring 8 mm (series 2, image 88). Vascular: Surgical clips are demonstrated along the proximal right subclavian and right common carotid arteries distal to the right brachiocephalic artery bifurcation which is on series 2, image 90. No residual proximal right  common carotid or right subclavian artery stenosis or thrombus. Both the right CCA and right subclavian arteries are patent and have a normal configuration. However, there is a new large low to intermediate density (15-65 Hounsfield units) inverted-U-shaped mass occupying the lateral aspect of the right carotid space at the level of the superior thyroid and larynx, and extending laterally. See coronal images 51 through 55. The mass encompasses an area of 69 x 73 x 64 mm (AP by transverse by CC). There is severe mass effect on and effacement of the right IJ cava however the right IJ does remain patent (series 2, image 64). The large mass is also inseparable from the deep aspect of the right sternocleidomastoid muscle. The mass does not appear to affect the right subclavian vein or artery. Major vascular structures in the neck and at the skullbase remain patent despite the above. Limited intracranial: Negative. Visualized orbits: Negative. Mastoids and visualized paranasal sinuses: Visualized paranasal sinuses and mastoids are stable and well pneumatized. Skeleton: Stable visualized osseous structures. Upper chest: The level of the large anterior right perihilar lung mass is not included. Decreased layering right pleural effusion. Partially visible irregular opacity in the lateral and posterior right upper lobe. Stable mild right superior peritracheal lymphadenopathy (8 mm short axis). Stable and negative visualized aortic arch except for calcified atherosclerosis. IMPRESSION: 1. 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 This at least partially occupies the right carotid space, and there is associated severe effacement of the right internal jugular vein which does remain patent. Although nonspecific I favor this is a large hematoma, and I suspect the right IJ is being compressed rather than partially thrombosed (i.e. no thrombus within the lumen of  the IJ). I considered metastatic disease as the etiology of this mass, but feel that unlikely (see # 3). 2. The mass does abut the proximal right CCA and right subclavian arteries, but those vessels maintain normal shape and patency. 3. No cervical lymphadenopathy, and no other neck mass. Stable mild right superior peritracheal lymphadenopathy. Electronically Signed   By: HGenevie AnnM.D.   On: 11/02/2016 14:06   Ct Angio Neck W Or Wo Contrast  Result Date: 10/15/2016 CLINICAL DATA:  Acute onset right-sided weakness and abnormal speech. EXAM: CT ANGIOGRAPHY HEAD AND NECK TECHNIQUE: Multidetector CT imaging of the head and neck was performed using the standard protocol during bolus administration of intravenous contrast. Multiplanar CT image reconstructions and MIPs were obtained to evaluate the vascular anatomy. Carotid stenosis measurements (when  applicable) are obtained utilizing NASCET criteria, using the distal internal carotid diameter as the denominator. CONTRAST:  90 mL Isovue 370 COMPARISON:  MRI brain 05/14/2016 FINDINGS: CTA NECK FINDINGS Aortic arch: Atherosclerotic calcifications are present at the aortic arch without compromise of the great vessel origins. There is no aneurysm. Right carotid system: The a saddle embolus is present at bifurcation of the innominate artery and to the right common carotid artery and subclavian artery. This is nonocclusive. There is some nonocclusive thrombus extending more superiorly within the right common carotid artery is well. Atherosclerotic changes are present at the right carotid bifurcation without significant stenosis. Left carotid system: The left common carotid artery is within normal limits. Atherosclerotic changes are present at left carotid bifurcation. There is calcified plaque proximally. Noncalcified posterior plaque more distally narrows the with 3 mm. This compares with a distal measurement of 4.5 mm. Posterior calcification just below the skullbase may  reflect a remote injury. There is no associated stenosis. Vertebral arteries: The vertebral arteries both originate from the subclavian arteries. There is no focal stenosis of the origin. The left vertebral artery the dominant vessel. There is no significant stenosis cannot air vertebral artery in the. Dense atherosclerotic calcifications narrowing of the left vertebral artery limiting to 1.5 mm. Skeleton: Vertebral body heights and alignment are normal. There is no focal lytic or blastic lesion. The patient is edentulous. Other neck: No significant cervical adenopathy is present. No focal mucosal or submucosal lesions are present. The submandibular and parotid glands are within normal limits bilaterally. Upper chest: A medial right upper lobe chest mass decreased in size from the previous PET scan. The lesion measures 6.0 x 7.3 cm. There is postobstructive pneumonitis in the right upper lobe. A right pleural effusion is present. Ill-defined opacity in the superior segment of the left lower lobe is incompletely imaged. Review of the MIP images confirms the above findings CTA HEAD FINDINGS Anterior circulation: Atherosclerotic calcifications are present in precavernous cavernous internal carotid arteries bilaterally without a significant stenosis relative to the more distal vessel. The terminus is intact bilaterally. The A1 segments are normal. The right M1 segment is normal. A nonocclusive thrombus is present in the distal left M1 segment at the MCA bifurcation. MCA branch vessels opacify the. They are of decreased caliber compared to the MCA branch vessels on the right. The area some atherosclerotic irregularity a MCA branch vessels. No focal occlusion is evident. Increased collaterals are evident on the left. Posterior circulation: Moderate stenosis is present in the dominant left vertebral artery at the dural margin. PICA origins are visualized and normal. The basilar artery is normal. Both posterior cerebral  arteries originate from basilar tip. There is mild irregularity without focal stenosis or occlusion. Venous sinuses: Dural sinuses are patent. The left transverse sinus dominant. Anatomic variants: None Delayed phase: 2 punctate areas of enhancement in the right frontal lobe from previous MRI are not seen on today's study. No areas of enhancement are evident on the source images. Review of the MIP images confirms the above findings IMPRESSION: 1. Thrombus in the distal left M1 segment to the bifurcation. Flow is present in reduced caliber left MCA branch vessels. The thrombus is likely nonocclusive. Collateral vessels are also present. 2. Saddle embolus at the innominate artery bifurcation into the right common carotid artery and right subclavian artery. This embolus is nonocclusive. 3. Atherosclerotic changes at the carotid bifurcations and cavernous internal carotid arteries bilaterally without significant stenosis. 4. Atherosclerotic calcifications with moderate stenosis in the  proximal left vertebral artery. These results were called by telephone at the time of interpretation on 10/15/2016 at 12:30 pm to Dr. Leonel Ramsay , who verbally acknowledged these results. Electronically Signed   By: San Morelle M.D.   On: 10/15/2016 13:05   Ct Chest Wo Contrast  Result Date: 10/17/2016 CLINICAL DATA:  RIGHT lung mass.  intubated, pneumothorax EXAM: CT CHEST WITHOUT CONTRAST TECHNIQUE: Multidetector CT imaging of the chest was performed following the standard protocol without IV contrast. COMPARISON:  CT chest pain radiograph 10/17/2016, PET-CT 05/02/2016 FINDINGS: Cardiovascular: No pericardial fluid.  Mild coronary calcification. Mediastinum/Nodes: No mediastinal lymphadenopathy. Lungs/Pleura: Large mass centered in the RIGHT middle lobe measuring 6.5 cm. This is hypermetabolic on comparison PET-CT scan. There are multiple rounded nodules in the LEFT lower lobe LEFT upper lobe measuring approximately 2.5 cm  each. There are 9 nodules. Single RIGHT upper lobe nodule measuring 3 cm similar. These nodules measure less than 1 cm on comparison PET-CT scan. Endotracheal to in the distal trachea in good position. Moderate size RIGHT pneumothorax. Extensive subcutaneous gas along the RIGHT chest wall. Upper Abdomen: NG tube in stomach Musculoskeletal: Several nondisplaced rib fractures of the RIGHT seventh and eighth ribs. IMPRESSION: 1. Moderate-sized RIGHT pneumothorax as described on radiograph same day. 2. 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 3. Endotracheal tube in good position. 4. RIGHT suprahilar mass. 5. Bilateral round pulmonary metastasis significantly enlarged from prior PET-CT scan. These results will be called to the ordering clinician or representative by the Radiologist Assistant, and communication documented in the PACS or zVision Dashboard. Electronically Signed   By: Suzy Bouchard M.D.   On: 10/17/2016 14:16   Ct Angio Chest Pe W And/or Wo Contrast  Result Date: 10/30/2016 CLINICAL DATA:  66 y/o M; acute chest pain with concern for pulmonary embolus. History of pulmonary malignancy. EXAM: CT ANGIOGRAPHY CHEST WITH CONTRAST TECHNIQUE: Multidetector CT imaging of the chest was performed using the standard protocol during bolus administration of intravenous contrast. Multiplanar CT image reconstructions and MIPs were obtained to evaluate the vascular anatomy. CONTRAST:  75 cc Isovue 370 COMPARISON:  10/17/2016 CT chest FINDINGS: Cardiovascular: There is a right suprahilar mass measuring up to 6.9 cm in the right suprahilar region (series 4, image 39), previously 65 mm, invading the right superior pulmonary vein and extending into the right aspect of the left atrium (series 4, image 52). Motion artifact in the lungs. No pulmonary embolus identified. There is narrowing of the right superior and middle lobe pulmonary artery is due to increasing invasion  from right suprahilar are pulmonary mass. Normal caliber thoracic aorta with mild calcific atherosclerosis. Normal heart size. No pericardial effusion. Mild coronary artery calcification. Mediastinum/Nodes: Left hilar lymphadenopathy is increased in size from the prior study. Normal trachea, esophagus, and thyroid gland. Lungs/Pleura: Numerous metastasis are present within the lungs bilaterally which are increased in size from the prior CT for example in the left upper lobe there is a nodule measuring 29 x 27 mm, previously 26 x 25 mm (series 6, image 69). Trace residual right pneumothorax. There consolidations within the left upper lobe lingula and left lower lobe likely representing interval development of pneumonia. Upper Abdomen: No acute abnormality. Musculoskeletal: Air within the anterior chest wall is markedly decreased in comparison with the prior CT of the chest with small residual. Soft tissue lung nodule of the right anterior inferior chest wall subcutaneous fat measuring 16 mm is stable (series 4, image 71). Right  lateral sixth and seventh mildly displaced acute rib fractures as seen on prior CT of the chest. Review of the MIP images confirms the above findings. IMPRESSION: 1. Moderate respiratory motion artifact. No pulmonary embolus identified. 2. Interval development of consolidations within the left lung base compatible with pneumonia. 3. Interval slight increase in size of right suprahilar mass and metastasis in the lungs bilaterally. 4. The right suprahilar mass invades the right superior pulmonary vein extending into the left atrium. 5. Trace residual right apical pneumothorax and small diminished volume of air in the anterior chest wall. 6. Stable mildly displaced right lateral sixth and seventh rib fractures. Electronically Signed   By: Kristine Garbe M.D.   On: 10/30/2016 22:07   Mr Brain Wo Contrast  Result Date: 10/16/2016 CLINICAL DATA:  66 y/o M; left MCA distribution infarct  post revascularization. EXAM: MRI HEAD WITHOUT CONTRAST TECHNIQUE: Multiplanar, multiecho pulse sequences of the brain and surrounding structures were obtained without intravenous contrast. COMPARISON:  10/15/2016 CT angiogram of the head. FINDINGS: Brain: Diffusion restriction is present within the left insula, left anterior temporal lobe, and left lateral frontal lobe in distribution similar to CT perfusion CBF less than 30%. There is an additional area of diffusion restriction within the left parietal lobe not indicated on the perfusion study. Areas of infarction demonstrate T2 FLAIR hyperintense signal abnormality and mild local mass effect. Motion degraded sagittal T1 weighted sequence. Small chronic infarction is present within the right parietal cortex. There are mild chronic microvascular ischemic changes of white matter and mild brain parenchymal volume loss. No abnormal susceptibility hypointensity is present to indicate intracranial hemorrhage. No hydrocephalus or extra-axial collection. Vascular: Normal flow voids. Skull and upper cervical spine: Normal marrow signal. Sinuses/Orbits: Negative. Other: None. IMPRESSION: Left MCA distribution diffusion restriction is present in the left insula, left anterior temporal lobe, and left lateral frontal lobe in distribution similar to CT perfusion CBF less than 30%. There is a small additional area of diffusion restriction within the left parietal lobe not indicated on the perfusion study. Findings are compatible with acute infarction. No associated hemorrhage identified. Electronically Signed   By: Kristine Garbe M.D.   On: 10/16/2016 04:49   Ir Angiogram Extremity Right  Result Date: 10/16/2016 INDICATION: Global aphasia. Right-sided weakness. CT perfusion examination revealed mismatch volume of 131 mL with the CBF < 30% volume of 43 mL, and a mismatch ratio of 4.0. Large near complete occlusive filling defect in the distal right middle cerebral  artery extending into the bifurcation middle cerebral artery just proximal to the bifurcation and extending into the bifurcation. EXAM: 1. EMERGENT LARGE VESSEL OCCLUSION THROMBOLYSIS (anterior CIRCULATION) COMPARISON:  CT angiogram of 10/15/2016. MEDICATIONS: Ancef 2 g IV. The antibiotic was administered within 1 hour of the procedure. ANESTHESIA/SEDATION: General anesthesia. CONTRAST:  Isovue 300 approximately 60 mL. FLUOROSCOPY TIME:  Fluoroscopy Time: 10 minutes 12 seconds (837 mGy). COMPLICATIONS: None immediate. TECHNIQUE: Following a full explanation of the procedure along with the potential associated complications, an informed witnessed consent was obtained. The risks of intracranial hemorrhage of 10%, worsening neurological deficit, ventilator dependency, death and inability to revascularize were all reviewed in detail with the patient's wife. The patient was then put under general anesthesia by the Department of Anesthesiology at [facility]. The right groin was prepped and draped in the usual sterile fashion. Thereafter using modified Seldinger technique, transfemoral access into the right common femoral artery was obtained without difficulty. Over a 0.035 inch guidewire a [place]  sheath was inserted. Through this, and also over a 0.035 inch guidewire a 5 [place] JB 1 catheter was advanced to the aortic arch region and selectively positioned in the innominate artery and the left common carotid artery. FINDINGS: The innominate arteriogram demonstrates a long segment lobulated filling defect which extends from the mid innominate artery across the origin of the right common carotid artery and the right vertebral artery. No angiographic flow is noted in the right vertebral artery. However, there is flow noted in the right common carotid artery. The right common carotid artery on the lateral projection demonstrates wide patency at the right carotid artery bifurcation to the supraclinoid  segment with opacification of the visualized right MCA distribution on the lateral projection. The left common carotid arteriogram demonstrates the origin of the left external carotid artery to be patent. The opacified portions of the left external carotid artery appear patent. The left internal carotid artery at the bulb to the cranial skull base demonstrates wide patency, with a small shelf-like plaque noted along the posterior wall of the left internal carotid artery at the distal aspect of the bulb. The left internal carotid artery is seen to opacify to the cranial skull base. The petrous segment is widely patent. There is a focal stenoses of approximately 30% of the caval cavernous segment of the left internal carotid artery. Distal to this, the distal cavernous and the supraclinoid segments are widely patent. The left middle cerebral artery in its M1 segment demonstrates patency. There is attenuated caliber in the distal left M1 segment with near complete occlusion extending into the inferior division with near complete occlusion of the superior division. Multiple filling defects are seen in this region. The left anterior cerebral artery is seen to opacify normally into the capillary and venous phases. The delayed arterial phase demonstrates partial retrograde opacification of the perisylvian branches from the pericallosal and callosal marginal branches. Also noted is prompt opacification via the anterior communicating artery of the right anterior cerebral A2 segment and the right anterior cerebral A1 segment. Opacification of the right middle cerebral artery M1 segment and distally is also noted from the left common carotid artery injection. PROCEDURE: The diagnostic JB 1 catheter in the left common carotid artery was then exchanged over a 0.035 inch 300 cm Rosen exchange guidewire for a 55 cm 8 [place] Brite tip neurovascular sheath using biplane roadmap technique and constant fluoroscopic guidance. Good  aspiration was obtained from the hub of the 8 French neurovascular sheath. This was then connected to continuous heparinized saline infusion. Over the [employer] guidewire, a 95 cm 8 [place] FlowGate balloon guide catheter which been prepped with 50% contrast and 50% heparinized saline infusion was then advanced and positioned in the left common carotid artery. The guidewire was removed. Good aspiration was obtained from the hub of the 8 [place] FlowGate guide catheter. A gentle constant injection demonstrated no evidence of spasms, dissections or of intraluminal filling defects. Over a 0.035 inch Roadrunner guidewire, using biplane roadmap technique and constant fluoroscopic guidance, the 8 [place] FlowGate guide catheter was then advanced to the cervical petrous junction of the left internal carotid artery. The guidewire was removed. Good aspiration was obtained from the hub of the 8 [place] FlowGate balloon guide catheter. A gentle contrast injection demonstrated no evidence spasms, dissections or of intraluminal filling defects. At this time, in a coaxial manner and with constant heparinized saline infusion using biplane roadmap technique and constant fluoroscopic guidance, a Trevo ProVue 021 microcatheter was advanced  over a 0.014 inch Softip Synchro micro guidewire to the distal end of the 8 [place] FlowGate guide catheter. With the micro guidewire leading with a J-tip configuration, the combination was navigated without difficulty to the supraclinoid left ICA. A torque device was then utilized to advance the micro guidewire to the left middle cerebral artery followed by the microcatheter. The micro guidewire was then advanced without difficulty through the inferior division of left middle cerebral artery into the M2 M3 region followed by the microcatheter. The micro guidewire was removed. Good aspiration was obtained from the hub of the microcatheter. Gentle contrast injection demonstrated a brisk antegrade  flow distally. A 4 mm x 40 mm Solitaire FR retrieval device was then purged with 50% contrast and 50% heparinized saline infusion. Thereafter this was advanced again in a coaxial manner and with constant heparinized saline infusion using biplane roadmap technique and constant fluoroscopic guidance to the distal end of the microcatheter. The O ring on the delivery microcatheter was then loosened. With slight forward gentle traction with the right hand on the delivery micro guidewire with the left hand the delivery microcatheter was retrieved unsheathing the distal end and then the proximal portion of the retrieval device. The tip of the microcatheter was just proximal to the proximal portion of the retrieval device. A brisk control arteriogram performed through the 8 [place] FlowGate guide catheter in the left internal carotid artery demonstrated brisk flow through the inferior division and partially through the superior division though improved. A TICI 2b reperfusion was noted. The balloon in the distal left internal carotid artery FlowGate guide catheter was then inflated for proximal flow arrest. The proximal portion of the retrieval device was then captured into the microcatheter. There on after with constant aspiration being applied with a 60 mL syringe at the hub of the [facility] guide catheter, the combination of the retrieval device and the microcatheter were retrieved and removed. Aspiration was continued as the balloon was deflated. Free back bleed was noted at the hub of 8 [place] FlowGate guide catheter. The aspirate contained 2 chunks of clots. Also noted in the [employer]oh[employer] was a another piece of mixed bloody and fibrosis clot. A control arteriogram performed through the 8 [place] FlowGate guide catheter in the left internal carotid artery demonstrated complete angiographic revascularization of the occluded left middle cerebral artery distribution. No angiographic evidence of filling defects or occlusions or  stenosis was seen. No evidence of extravasation, or mass-effect on the major vessel was noted. Moderate spasm was noted in the inferior division of the left middle cerebral artery which responded promptly to 2 aliquots of 25 mics of nitroglycerin given through the [facility] guide catheter. A final control arteriogram performed through the [facility] guide catheter in the left internal carotid artery demonstrated complete angiographic revascularization of the left MCA distribution. The left anterior cerebral artery appeared patent with brisk flow into the contralateral cerebral hemisphere as described previously. The patient's neurological status and hemodynamic status remained stable throughout the procedure. The 8 [place] FlowGate guide catheter and the 8 [place] Brite tip neurovascular sheath were retrieved in the abdominal aorta and exchanged over a J-tip guidewire for an 8 [place] Pinnacle sheath. This was then removed successfully with the application of an external closure device with compression at the puncture site for 20 minutes. The groin site appeared soft without evidence of a hematoma. The distal pulses remained stable palpable bilaterally and unchanged compared to prior to the procedure. IMPRESSION: Status post endovascular complete revascularization of  left MCA occlusion with 1 pass with the Solitaire FR 4 mm x 40 mm retrieval device achieving a TICI 3 reperfusion. Groin puncture time 13:12. Revascularization with TICI 2b at 13:35. Revascularization with TICI 3 at 13:39. PLAN: Patient to CT scanner for postprocedural CT scan of brain. Electronically Signed   By: Luanne Bras M.D.   On: 10/15/2016 21:34   Ct Cerebral Perfusion W Contrast  Result Date: 10/15/2016 CLINICAL DATA:  Acute onset of right sided weakness and abnormal speech. Lung cancer. EXAM: CT PERFUSION BRAIN TECHNIQUE: Multiphase CT imaging of the brain was performed following IV bolus contrast injection. Subsequent parametric  perfusion maps were calculated using RAPID software. CONTRAST:  90 mL Isovue 370 COMPARISON:  MRI brain 05/14/2016. FINDINGS: CT Brain Perfusion Findings: CBF (<30%) Volume: 65m Perfusion (Tmax>6.0s) volume: 174mMismatch Volume: 13118mnfarction Location:Left MCA territory. Arterial and venous and was are excellent. Minimal patient motion is evident. IMPRESSION: 1. The acute infarct involving the left MCA territory with estimated volume of CT a less than 30% at 43 mL. 2. Mismatch volume 131 mL. These results were called by telephone at the time of interpretation on 10/15/2016 at 12:30 pm to Dr. KIRLeonel Ramsaywho verbally acknowledged these results. Electronically Signed   By: ChrSan MorelleD.   On: 10/15/2016 12:39   Dg Chest Portable 1 View  Result Date: 10/30/2016 CLINICAL DATA:  Shortness of Breath EXAM: PORTABLE CHEST 1 VIEW COMPARISON:  10/27/2016.  CT scan 10/17/2016 FINDINGS: Right hilar mass again noted. Bilateral pulmonary metastases are similar prior. Asymmetric elevation right hemidiaphragm unchanged. Cardiopericardial silhouette is at upper limits of normal for size. Nonacute right rib fractures evident. Telemetry leads overlie the chest. IMPRESSION: Stable exam. Right hilar mass with bilateral pulmonary metastases. No new or acute interval findings. Electronically Signed   By: EriMisty StanleyD.   On: 10/30/2016 18:45   Dg Chest Port 1 View  Result Date: 10/27/2016 CLINICAL DATA:  Cough and shortness of breath EXAM: PORTABLE CHEST 1 VIEW COMPARISON:  October 25, 2016 FINDINGS: Multiple metastatic foci are noted in both lungs, stable. The dominant mass with adjacent consolidation and volume loss in the right mid lung remain. There is stable elevation of the right hemidiaphragm. Heart size and pulmonary vascularity are normal. No adenopathy. There is aortic atherosclerosis. There are stable rib fractures on the right. There are surgical clips the right neck region. Subcutaneous air on the  right is stable. IMPRESSION: Persistent volume loss the right with elevation the right hemidiaphragm. Large mass in the medial right mid lung with adjacent consolidation. Multiple rounded metastatic foci noted bilaterally. Stable cardiac silhouette. There is aortic atherosclerosis. Rib fractures on the right appear stable. No pneumothorax evident. Electronically Signed   By: WilLowella GripI M.D.   On: 10/27/2016 07:26   Dg Chest Port 1 View  Result Date: 10/25/2016 CLINICAL DATA:  Evaluate right-sided pneumothorax. EXAM: PORTABLE CHEST 1 VIEW COMPARISON:  October 24, 2016 FINDINGS: A right central mass persists with peripheral atelectasis. Multiple masses are seen in the left mid and lower lung. No pneumothorax. Elevation of the right hemidiaphragm remains. The cardiomediastinal silhouette is stable. A small amount of air seen in the soft tissues at the base of the neck on the right. Right-sided rib fractures are noted. IMPRESSION: 1. Bilateral lung masses. No pneumothorax seen today. Persistent right rib fractures. Electronically Signed   By: DavDorise BullionI M.D   On: 10/25/2016 08:23   Dg Chest Port 1 [id number]  Result Date: 10/24/2016 CLINICAL DATA:  Follow-up pneumothorax EXAM: PORTABLE CHEST 1 VIEW COMPARISON:  10/24/2016, 10/23/2016 FINDINGS: Removal of right-sided chest tube. No definitive right pneumothorax. Elevated right diaphragm. Right hilar mass. Multiple bilateral pulmonary masses as before. Stable heart size. Atherosclerosis. IMPRESSION: 1. Removal of right-sided chest tube. No definitive right pneumothorax 2. Grossly stable appearance of the thorax with bilateral lung masses Electronically Signed   By: Donavan Foil M.D.   On: 10/24/2016 18:59   Dg Chest Port 1 View  Result Date: 10/24/2016 CLINICAL DATA:  Hypertension.  Lung carcinoma.  Recent pneumothorax EXAM: PORTABLE CHEST 1 VIEW COMPARISON:  October 23, 2016 FINDINGS: Chest tube is present on the right without appreciable pneumothorax.  Multiple mass lesions are noted bilaterally consistent with metastases. The dominant right perihilar mass is stable with patchy consolidation in the right mid lung, stable. There is volume loss on the right with elevation of the right hemidiaphragm. The heart size is normal. The pulmonary vascularity is normal. There is degenerative change in each shoulder. There are rib fractures on the right. There is atherosclerotic calcification in the aorta. IMPRESSION: No pneumothorax with chest tube unchanged in position. Widespread pulmonary metastases. Dominant mass on the right with adjacent consolidation in the right mid lung stable. Stable cardiac silhouette. There is aortic atherosclerosis. Electronically Signed   By: Lowella Grip III M.D.   On: 10/24/2016 07:58   Dg Chest Port 1 View  Result Date: 10/23/2016 CLINICAL DATA:  Right pneumothorax. EXAM: PORTABLE CHEST 1 VIEW COMPARISON:  10/22/2016 FINDINGS: The endotracheal and enteric tubes have been removed. Right chest tube remains in place. Cardiomediastinal silhouette is unchanged. Right hemidiaphragm remains elevated. Right hilar mass and additional bilateral lung masses/ nodules are unchanged. Streaky opacity extending laterally from the right hilar mass is similar to the prior study. There may be a small right pleural effusion. No pneumothorax is identified. Soft tissue emphysema is again noted in the right chest wall. IMPRESSION: 1. No pneumothorax. 2. Unchanged appearance of the lungs including bilateral masses. Electronically Signed   By: Logan Bores M.D.   On: 10/23/2016 17:12   Dg Chest Port 1 View  Result Date: 10/22/2016 CLINICAL DATA:  Respiratory failure, known lung malignancy. History of COPD EXAM: PORTABLE CHEST 1 VIEW COMPARISON:  Portable chest x-ray of October 21, 2016 FINDINGS: The right hemidiaphragm is higher today than on yesterday's study. A large right hilar mass is stable. The right chest tube is in stable position. No definite  right-sided pneumothorax is observed. Multiple pulmonary parenchymal masses are noted in the left mid and lower lung and at the right lung base. The heart and pulmonary vascularity are normal. The endotracheal tube tip lies 4.7 cm above the carina. The esophagogastric tube tip projects below the inferior margin of the image. IMPRESSION: Fairly stable appearance of the chest allowing for differences in positioning. Multiple bilateral pulmonary masses with dominant right hilar mass. No pneumothorax or pleural effusion. Electronically Signed   By: Daniell  [place] M.D.   On: 10/22/2016 07:06   Dg Chest Port 1 View  Result Date: 10/21/2016 CLINICAL DATA:  Follow-up right-sided pneumothorax with chest tube treatment. EXAM: PORTABLE CHEST 1 VIEW COMPARISON:  Portable chest x-ray of August 22, 2016 FINDINGS: There remains volume loss on the right. No definite pneumothorax is observed. A central soft tissue masslike density is stable in the right hilar region. The right-sided chest tube tip projects over the posterior aspect of the fourth rib and is stable. No pleural effusion  is observed. On the left there are persistent nodular masses in the lower lung. The heart and pulmonary vascularity are normal. The endotracheal tube tip lies 3.5 cm above the carina. The esophagogastric tube tip projects below the inferior margin of the image. IMPRESSION: Stable right hilar and left mid and lower lung parenchymal masses. No right-sided pneumothorax is evident today. There is no pleural effusion. The support tubes are in reasonable position. Electronically Signed   By: Verdell  [place] M.D.   On: 10/21/2016 07:05   Dg Chest Port 1 View  Result Date: 10/20/2016 CLINICAL DATA:  Right pneumothorax EXAM: PORTABLE CHEST 1 VIEW COMPARISON:  10/19/2016 FINDINGS: Cardiomediastinal silhouette is stable. Right chest tube is unchanged in position. Again noted right perihilar mass. No pneumothorax. Multiple lung nodules are stable from prior  exam. Stable endotracheal and NG tube position. Mild residual subcutaneous emphysema right chest wall and right supraclavicular region. IMPRESSION: Right chest tube is unchanged in position. Again noted right perihilar mass. No pneumothorax. Multiple lung nodules are stable from prior exam. Stable endotracheal and NG tube position. Electronically Signed   By: Lahoma Crocker M.D.   On: 10/20/2016 11:38   Dg Chest Port 1 View  Result Date: 10/19/2016 CLINICAL DATA:  Respiratory failure.  Cancer. EXAM: PORTABLE CHEST 1 VIEW COMPARISON:  10/17/2016, 10/18/2016 FINDINGS: Right chest tube remains in place. No pneumothorax identified on the right. Decrease in chest wall gas on the right. Large mass lesion in the right perihilar region. Multiple lung nodules on the left compatible with metastatic cancer. Mild bibasilar atelectasis/ infiltrate with mild progression in the interval. Endotracheal tube in good position. NG tube in place entering the stomach. IMPRESSION: Right chest tube remains in place.  No pneumothorax Mild Progression of bibasilar atelectasis/ infiltrate Endotracheal tube remains in good position. Electronically Signed   By: Franchot Gallo M.D.   On: 10/19/2016 07:14   Dg Chest Port 1 View  Result Date: 10/18/2016 CLINICAL DATA:  Respiratory failure EXAM: PORTABLE CHEST 1 VIEW COMPARISON:  October 17, 2016 FINDINGS: The ETT remains in good position, terminating 3.2 cm above the carina. The enteric tube courses below today's study, below the diaphragm. The right chest tube remains in place and terminates between the medial fourth and fifth posterior ribs. There is significant subcutaneous air in the right neck in lateral chest wall. A discrete pneumothorax is not identified but evaluation is limited as support apparatus overlies the right apex. A right hilar mass and multiple left greater than right pulmonary nodules are unchanged. Right rib fractures are again identified. No other interval changes.  IMPRESSION: 1. Support apparatus remains in place. No definitive right-sided pneumothorax but support apparatus obscures the right apex. 2. Unchanged right hilar mass and lung nodules. 3. Extensive subcutaneous air in the right neck and lateral chest wall. Electronically Signed   By: Dorise Bullion III M.D   On: 10/18/2016 07:15   Dg Chest Port 1 View  Result Date: 10/17/2016 CLINICAL DATA:  Right pneumothorax. EXAM: PORTABLE CHEST 1 VIEW COMPARISON:  Chest radiograph and CT 10/17/2016 FINDINGS: Endotracheal tube terminates approximately 3.5 cm above the carina. Enteric tube courses towards the left upper abdomen with tip not imaged. A right chest tube has been placed and terminates over the medial posterior fourth rib interspace. There is a small right pneumothorax, decreased in size from today's earlier studies. A right hilar mass and multiple left greater than right lung nodules are again noted. No large pleural effusion is seen. Extensive subcutaneous emphysema  remains in the right chest wall extending into the neck. Right rib fractures are noted. IMPRESSION: 1. Interval right chest tube placement with decreased size of right pneumothorax. 2. Unchanged right hilar mass and lung nodules. Electronically Signed   By: Logan Bores M.D.   On: 10/17/2016 16:04   Dg Chest Port 1 View  Result Date: 10/17/2016 CLINICAL DATA:  Post endotracheal tube placement EXAM: PORTABLE CHEST 1 VIEW COMPARISON:  10/17/2016 FINDINGS: Cardiomediastinal silhouette is stable. Again noted is right hilar mass. Nodular metastasis left lung are stable. Endotracheal tube in place with tip 3.4 cm above the carina. Extensive subcutaneous emphysema right chest wall again noted. Stable small right lateral pneumothorax. Subcutaneous emphysema right supraclavicular region P IMPRESSION: Again noted is right hilar mass. Nodular metastasis left lung are stable. Endotracheal tube in place with tip 3.4 cm above the carina. Extensive subcutaneous  emphysema right chest wall again noted. Stable small right lateral pneumothorax. Electronically Signed   By: Lahoma Crocker M.D.   On: 10/17/2016 10:30   Dg Chest Port 1 View  Result Date: 10/17/2016 CLINICAL DATA:  Intubation. EXAM: PORTABLE CHEST 1 VIEW COMPARISON:  Radiograph of October 16, 2016. FINDINGS: Endotracheal tube is approximately 5 cm above the carina in grossly good position. Large right hilar mass in left pulmonary lesions are noted consistent with metastatic disease. There is interval development of mild right basilar pneumothorax with subcutaneous emphysema seen over the right lateral chest wall. Right rib fractures are again noted. IMPRESSION: Endotracheal tube in grossly good position. Stable large right hilar mass and multiple left pulmonary nodules are noted concerning for metastatic disease. Interval development of mild right basilar pneumothorax with associated subcutaneous emphysema overlying right lateral chest wall. Right rib fractures are again noted. Critical Value/emergent results were called by telephone at the time of interpretation on 10/17/2016 at 9:03 am to Dr. Asa Saunas , who verbally acknowledged these results. Electronically Signed   By: Marijo Conception, M.D.   On: 10/17/2016 09:03   Dg Chest Port 1 View  Result Date: 10/16/2016 CLINICAL DATA:  Ventilator dependent respiratory failure. History of CVA, COPD, lung malignancy, acute right rib fractures. EXAM: PORTABLE CHEST 1 VIEW COMPARISON:  Portable chest x-ray of October 15, 2016 FINDINGS: The endotracheal tube tip lies approximately 1.5 cm above the superior margin of the clavicular heads. The lungs are reasonably well inflated. A large right hilar mass is stable. Multiple pulmonary parenchymal masses in the mid and lower left lung also are stable. There is no pneumothorax or pleural effusion. Fractures of the lateral aspects of the right sixth and seventh ribs are again observed. The heart and pulmonary vascularity are  normal. IMPRESSION: High positioning of the endotracheal tube. Advancement by at least 5 cm would be useful. The remainder the findings in the chest are stable. Electronically Signed   By: Jarick  Martinique M.D.   On: 10/16/2016 07:43   Dg Chest Port 1 View  Result Date: 10/15/2016 CLINICAL DATA:  Status post intubation EXAM: PORTABLE CHEST 1 VIEW COMPARISON:  04/28/2016 FINDINGS: Cardiac shadow is within normal limits. Multiple pulmonary mass lesions are noted particularly in the region of the right hilum but scattered throughout both lungs. An endotracheal tube is noted in satisfactory position. No pneumothorax is noted. Multiple right rib fractures are seen. IMPRESSION: Multiple right rib fractures. Endotracheal tube in satisfactory position. Changes consistent with lung carcinoma and multiple scattered pulmonary lesions. These have progressed in the interval from the prior exam of 05/02/2016 Electronically Signed  By: Inez Catalina M.D.   On: 10/15/2016 16:18   Ir Percutaneous Art Thrombectomy/infusion Intracranial Inc Diag Angio  Result Date: 10/16/2016 INDICATION: Global aphasia. Right-sided weakness. CT perfusion examination revealed mismatch volume of 131 mL with the CBF < 30% volume of 43 mL, and a mismatch ratio of 4.0. Large near complete occlusive filling defect in the distal right middle cerebral artery extending into the bifurcation middle cerebral artery just proximal to the bifurcation and extending into the bifurcation. EXAM: 1. EMERGENT LARGE VESSEL OCCLUSION THROMBOLYSIS (anterior CIRCULATION) COMPARISON:  CT angiogram of 10/15/2016. MEDICATIONS: Ancef 2 g IV. The antibiotic was administered within 1 hour of the procedure. ANESTHESIA/SEDATION: General anesthesia. CONTRAST:  Isovue 300 approximately 60 mL. FLUOROSCOPY TIME:  Fluoroscopy Time: 10 minutes 12 seconds (837 mGy). COMPLICATIONS: None immediate. TECHNIQUE: Following a full explanation of the procedure along with the potential  associated complications, an informed witnessed consent was obtained. The risks of intracranial hemorrhage of 10%, worsening neurological deficit, ventilator dependency, death and inability to revascularize were all reviewed in detail with the patient's wife. The patient was then put under general anesthesia by the Department of Anesthesiology at [facility]. The right groin was prepped and draped in the usual sterile fashion. Thereafter using modified Seldinger technique, transfemoral access into the right common femoral artery was obtained without difficulty. Over a 0.035 inch guidewire a 5 French Pinnacle sheath was inserted. Through this, and also over a 0.035 inch guidewire a 5 [place] JB 1 catheter was advanced to the aortic arch region and selectively positioned in the innominate artery and the left common carotid artery. FINDINGS: The innominate arteriogram demonstrates a long segment lobulated filling defect which extends from the mid innominate artery across the origin of the right common carotid artery and the right vertebral artery. No angiographic flow is noted in the right vertebral artery. However, there is flow noted in the right common carotid artery. The right common carotid artery on the lateral projection demonstrates wide patency at the right carotid artery bifurcation to the supraclinoid segment with opacification of the visualized right MCA distribution on the lateral projection. The left common carotid arteriogram demonstrates the origin of the left external carotid artery to be patent. The opacified portions of the left external carotid artery appear patent. The left internal carotid artery at the bulb to the cranial skull base demonstrates wide patency, with a small shelf-like plaque noted along the posterior wall of the left internal carotid artery at the distal aspect of the bulb. The left internal carotid artery is seen to opacify to the cranial skull base. The petrous segment is  widely patent. There is a focal stenoses of approximately 30% of the caval cavernous segment of the left internal carotid artery. Distal to this, the distal cavernous and the supraclinoid segments are widely patent. The left middle cerebral artery in its M1 segment demonstrates patency. There is attenuated caliber in the distal left M1 segment with near complete occlusion extending into the inferior division with near complete occlusion of the superior division. Multiple filling defects are seen in this region. The left anterior cerebral artery is seen to opacify normally into the capillary and venous phases. The delayed arterial phase demonstrates partial retrograde opacification of the perisylvian branches from the pericallosal and callosal marginal branches. Also noted is prompt opacification via the anterior communicating artery of the right anterior cerebral A2 segment and the right anterior cerebral A1 segment. Opacification of the right middle cerebral artery M1 segment and distally  is also noted from the left common carotid artery injection. PROCEDURE: The diagnostic JB 1 catheter in the left common carotid artery was then exchanged over a 0.035 inch 300 cm Rosen exchange guidewire for a 55 cm 8 French Brite tip neurovascular sheath using biplane roadmap technique and constant fluoroscopic guidance. Good aspiration was obtained from the hub of the 8 French neurovascular sheath. This was then connected to continuous heparinized saline infusion. Over the [employer] guidewire, a 95 cm 8 [place] FlowGate balloon guide catheter which been prepped with 50% contrast and 50% heparinized saline infusion was then advanced and positioned in the left common carotid artery. The guidewire was removed. Good aspiration was obtained from the hub of the 8 [place] FlowGate guide catheter. A gentle constant injection demonstrated no evidence of spasms, dissections or of intraluminal filling defects. Over a 0.035 inch  Roadrunner guidewire, using biplane roadmap technique and constant fluoroscopic guidance, the 8 [place] FlowGate guide catheter was then advanced to the cervical petrous junction of the left internal carotid artery. The guidewire was removed. Good aspiration was obtained from the hub of the 8 [place] FlowGate balloon guide catheter. A gentle contrast injection demonstrated no evidence spasms, dissections or of intraluminal filling defects. At this time, in a coaxial manner and with constant heparinized saline infusion using biplane roadmap technique and constant fluoroscopic guidance, a Trevo ProVue 021 microcatheter was advanced over a 0.014 inch Softip Synchro micro guidewire to the distal end of the 8 [place] FlowGate guide catheter. With the micro guidewire leading with a J-tip configuration, the combination was navigated without difficulty to the supraclinoid left ICA. A torque device was then utilized to advance the micro guidewire to the left middle cerebral artery followed by the microcatheter. The micro guidewire was then advanced without difficulty through the inferior division of left middle cerebral artery into the M2 M3 region followed by the microcatheter. The micro guidewire was removed. Good aspiration was obtained from the hub of the microcatheter. Gentle contrast injection demonstrated a brisk antegrade flow distally. A 4 mm x 40 mm Solitaire FR retrieval device was then purged with 50% contrast and 50% heparinized saline infusion. Thereafter this was advanced again in a coaxial manner and with constant heparinized saline infusion using biplane roadmap technique and constant fluoroscopic guidance to the distal end of the microcatheter. The O ring on the delivery microcatheter was then loosened. With slight forward gentle traction with the right hand on the delivery micro guidewire with the left hand the delivery microcatheter was retrieved unsheathing the distal end and then the proximal portion of  the retrieval device. The tip of the microcatheter was just proximal to the proximal portion of the retrieval device. A brisk control arteriogram performed through the 8 [place] FlowGate guide catheter in the left internal carotid artery demonstrated brisk flow through the inferior division and partially through the superior division though improved. A TICI 2b reperfusion was noted. The balloon in the distal left internal carotid artery FlowGate guide catheter was then inflated for proximal flow arrest. The proximal portion of the retrieval device was then captured into the microcatheter. There on after with constant aspiration being applied with a 60 mL syringe at the hub of the [facility] guide catheter, the combination of the retrieval device and the microcatheter were retrieved and removed. Aspiration was continued as the balloon was deflated. Free back bleed was noted at the hub of 8 [place] FlowGate guide catheter. The aspirate contained 2 chunks of clots. Also noted in  the [employer] was a another piece of mixed bloody and fibrosis clot. A control arteriogram performed through the 8 [place] FlowGate guide catheter in the left internal carotid artery demonstrated complete angiographic revascularization of the occluded left middle cerebral artery distribution. No angiographic evidence of filling defects or occlusions or stenosis was seen. No evidence of extravasation, or mass-effect on the major vessel was noted. Moderate spasm was noted in the inferior division of the left middle cerebral artery which responded promptly to 2 aliquots of 25 mics of nitroglycerin given through the [facility] guide catheter. A final control arteriogram performed through the [facility] guide catheter in the left internal carotid artery demonstrated complete angiographic revascularization of the left MCA distribution. The left anterior cerebral artery appeared patent with brisk flow into the contralateral cerebral hemisphere as described  previously. The patient's neurological status and hemodynamic status remained stable throughout the procedure. The 8 [place] FlowGate guide catheter and the 8 [place] Brite tip neurovascular sheath were retrieved in the abdominal aorta and exchanged over a J-tip guidewire for an 8 [place] Pinnacle sheath. This was then removed successfully with the application of an external closure device with compression at the puncture site for 20 minutes. The groin site appeared soft without evidence of a hematoma. The distal pulses remained stable palpable bilaterally and unchanged compared to prior to the procedure. IMPRESSION: Status post endovascular complete revascularization of left MCA occlusion with 1 pass with the Solitaire FR 4 mm x 40 mm retrieval device achieving a TICI 3 reperfusion. Groin puncture time 13:12. Revascularization with TICI 2b at 13:35. Revascularization with TICI 3 at 13:39. PLAN: Patient to CT scanner for postprocedural CT scan of brain. Electronically Signed   By: Luanne Bras M.D.   On: 10/15/2016 21:34   Ct Head Code Stroke W/o Cm  Result Date: 10/15/2016 CLINICAL DATA:  Code stroke. Acute onset of right-sided weakness. Difficulty with speech. On lung cancer. EXAM: CT HEAD WITHOUT CONTRAST TECHNIQUE: Contiguous axial images were obtained from the base of the skull through the vertex without intravenous contrast. COMPARISON:  None. FINDINGS: Brain: Areas of hypoattenuation involve the left insular cortex. Additional hypoattenuation is present in the left frontal operculum and the super ganglionic left frontal lobe. Decreased attenuation is present within the left temporal tip. There is no hemorrhage or mass lesion. Ventricles are normal size. No other acute infarct present. Vascular: Atherosclerotic calcifications are present at the cavernous internal carotid arteries and left greater than right vertebral arteries without a hyperdense vessel. Skull: No focal lytic or blastic lesions are  present. Sinuses/Orbits: Paranasal sinuses and mastoid air cells are clear. ASPECTS [facility] Stroke Program Early CT Score) - Ganglionic level infarction (caudate, lentiform nuclei, internal capsule, insula, M1-M3 cortex): 5/7 - Supraganglionic infarction (M4-M6 cortex): 2/3 Total score (0-10 with 10 being normal): 7/10 IMPRESSION: 1. Acute nonhemorrhagic infarct involving the left MCA territory with involvement of the left insular cortex, left frontal operculum, left temporal tip, and left super ganglionic frontal lobe. 2. ASPECTS is 7/10 These results were called by telephone at the time of interpretation on 10/15/2016 at 12:30 pm to Dr. Leonel Ramsay , who verbally acknowledged these results. Electronically Signed   By: San Morelle M.D.   On: 10/15/2016 12:33   Ir Angio Intra Extracran Sel Com Carotid Innominate Uni R Mod Sed  Result Date: 10/16/2016 INDICATION: Global aphasia. Right-sided weakness. CT perfusion examination revealed mismatch volume of 131 mL with the CBF < 30% volume of 43 mL, and a mismatch ratio of  4.0. Large near complete occlusive filling defect in the distal right middle cerebral artery extending into the bifurcation middle cerebral artery just proximal to the bifurcation and extending into the bifurcation. EXAM: 1. EMERGENT LARGE VESSEL OCCLUSION THROMBOLYSIS (anterior CIRCULATION) COMPARISON:  CT angiogram of 10/15/2016. MEDICATIONS: Ancef 2 g IV. The antibiotic was administered within 1 hour of the procedure. ANESTHESIA/SEDATION: General anesthesia. CONTRAST:  Isovue 300 approximately 60 mL. FLUOROSCOPY TIME:  Fluoroscopy Time: 10 minutes 12 seconds (837 mGy). COMPLICATIONS: None immediate. TECHNIQUE: Following a full explanation of the procedure along with the potential associated complications, an informed witnessed consent was obtained. The risks of intracranial hemorrhage of 10%, worsening neurological deficit, ventilator dependency, death and inability to revascularize were  all reviewed in detail with the patient's wife. The patient was then put under general anesthesia by the Department of Anesthesiology at [facility]. The right groin was prepped and draped in the usual sterile fashion. Thereafter using modified Seldinger technique, transfemoral access into the right common femoral artery was obtained without difficulty. Over a 0.035 inch guidewire a 5 French Pinnacle sheath was inserted. Through this, and also over a 0.035 inch guidewire a 5 [place] JB 1 catheter was advanced to the aortic arch region and selectively positioned in the innominate artery and the left common carotid artery. FINDINGS: The innominate arteriogram demonstrates a long segment lobulated filling defect which extends from the mid innominate artery across the origin of the right common carotid artery and the right vertebral artery. No angiographic flow is noted in the right vertebral artery. However, there is flow noted in the right common carotid artery. The right common carotid artery on the lateral projection demonstrates wide patency at the right carotid artery bifurcation to the supraclinoid segment with opacification of the visualized right MCA distribution on the lateral projection. The left common carotid arteriogram demonstrates the origin of the left external carotid artery to be patent. The opacified portions of the left external carotid artery appear patent. The left internal carotid artery at the bulb to the cranial skull base demonstrates wide patency, with a small shelf-like plaque noted along the posterior wall of the left internal carotid artery at the distal aspect of the bulb. The left internal carotid artery is seen to opacify to the cranial skull base. The petrous segment is widely patent. There is a focal stenoses of approximately 30% of the caval cavernous segment of the left internal carotid artery. Distal to this, the distal cavernous and the supraclinoid segments are widely  patent. The left middle cerebral artery in its M1 segment demonstrates patency. There is attenuated caliber in the distal left M1 segment with near complete occlusion extending into the inferior division with near complete occlusion of the superior division. Multiple filling defects are seen in this region. The left anterior cerebral artery is seen to opacify normally into the capillary and venous phases. The delayed arterial phase demonstrates partial retrograde opacification of the perisylvian branches from the pericallosal and callosal marginal branches. Also noted is prompt opacification via the anterior communicating artery of the right anterior cerebral A2 segment and the right anterior cerebral A1 segment. Opacification of the right middle cerebral artery M1 segment and distally is also noted from the left common carotid artery injection. PROCEDURE: The diagnostic JB 1 catheter in the left common carotid artery was then exchanged over a 0.035 inch 300 cm Rosen exchange guidewire for a 55 cm 8 [place] Brite tip neurovascular sheath using biplane roadmap technique and constant fluoroscopic guidance.  Good aspiration was obtained from the hub of the 8 French neurovascular sheath. This was then connected to continuous heparinized saline infusion. Over the [employer] guidewire, a 95 cm 8 [place] FlowGate balloon guide catheter which been prepped with 50% contrast and 50% heparinized saline infusion was then advanced and positioned in the left common carotid artery. The guidewire was removed. Good aspiration was obtained from the hub of the 8 [place] FlowGate guide catheter. A gentle constant injection demonstrated no evidence of spasms, dissections or of intraluminal filling defects. Over a 0.035 inch Roadrunner guidewire, using biplane roadmap technique and constant fluoroscopic guidance, the 8 [place] FlowGate guide catheter was then advanced to the cervical petrous junction of the left internal carotid artery.  The guidewire was removed. Good aspiration was obtained from the hub of the 8 [place] FlowGate balloon guide catheter. A gentle contrast injection demonstrated no evidence spasms, dissections or of intraluminal filling defects. At this time, in a coaxial manner and with constant heparinized saline infusion using biplane roadmap technique and constant fluoroscopic guidance, a Trevo ProVue 021 microcatheter was advanced over a 0.014 inch Softip Synchro micro guidewire to the distal end of the 8 [place] FlowGate guide catheter. With the micro guidewire leading with a J-tip configuration, the combination was navigated without difficulty to the supraclinoid left ICA. A torque device was then utilized to advance the micro guidewire to the left middle cerebral artery followed by the microcatheter. The micro guidewire was then advanced without difficulty through the inferior division of left middle cerebral artery into the M2 M3 region followed by the microcatheter. The micro guidewire was removed. Good aspiration was obtained from the hub of the microcatheter. Gentle contrast injection demonstrated a brisk antegrade flow distally. A 4 mm x 40 mm Solitaire FR retrieval device was then purged with 50% contrast and 50% heparinized saline infusion. Thereafter this was advanced again in a coaxial manner and with constant heparinized saline infusion using biplane roadmap technique and constant fluoroscopic guidance to the distal end of the microcatheter. The O ring on the delivery microcatheter was then loosened. With slight forward gentle traction with the right hand on the delivery micro guidewire with the left hand the delivery microcatheter was retrieved unsheathing the distal end and then the proximal portion of the retrieval device. The tip of the microcatheter was just proximal to the proximal portion of the retrieval device. A brisk control arteriogram performed through the 8 [place] FlowGate guide catheter in the left  internal carotid artery demonstrated brisk flow through the inferior division and partially through the superior division though improved. A TICI 2b reperfusion was noted. The balloon in the distal left internal carotid artery FlowGate guide catheter was then inflated for proximal flow arrest. The proximal portion of the retrieval device was then captured into the microcatheter. There on after with constant aspiration being applied with a 60 mL syringe at the hub of the [facility] guide catheter, the combination of the retrieval device and the microcatheter were retrieved and removed. Aspiration was continued as the balloon was deflated. Free back bleed was noted at the hub of 8 [place] FlowGate guide catheter. The aspirate contained 2 chunks of clots. Also noted in the [employer] was a another piece of mixed bloody and fibrosis clot. A control arteriogram performed through the 8 [place] FlowGate guide catheter in the left internal carotid artery demonstrated complete angiographic revascularization of the occluded left middle cerebral artery distribution. No angiographic evidence of filling defects or occlusions or stenosis  was seen. No evidence of extravasation, or mass-effect on the major vessel was noted. Moderate spasm was noted in the inferior division of the left middle cerebral artery which responded promptly to 2 aliquots of 25 mics of nitroglycerin given through the [facility] guide catheter. A final control arteriogram performed through the [facility] guide catheter in the left internal carotid artery demonstrated complete angiographic revascularization of the left MCA distribution. The left anterior cerebral artery appeared patent with brisk flow into the contralateral cerebral hemisphere as described previously. The patient's neurological status and hemodynamic status remained stable throughout the procedure. The 8 [place] FlowGate guide catheter and the 8 [place] Brite tip neurovascular sheath were retrieved in  the abdominal aorta and exchanged over a J-tip guidewire for an 8 [place] Pinnacle sheath. This was then removed successfully with the application of an external closure device with compression at the puncture site for 20 minutes. The groin site appeared soft without evidence of a hematoma. The distal pulses remained stable palpable bilaterally and unchanged compared to prior to the procedure. IMPRESSION: Status post endovascular complete revascularization of left MCA occlusion with 1 pass with the Solitaire FR 4 mm x 40 mm retrieval device achieving a TICI 3 reperfusion. Groin puncture time 13:12. Revascularization with TICI 2b at 13:35. Revascularization with TICI 3 at 13:39. PLAN: Patient to CT scanner for postprocedural CT scan of brain. Electronically Signed   By: Luanne Bras M.D.   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 Repeat MRI in October 16, 2016 was without contrast, but did not comment on this lesion. CT of the chest recently revealed possible progression of disease, but it setting of sepsis and bilateral pneumonia is difficult to confirm. The isolated metastasis in patient's left pubic bone has progressed and patient never was able to initiate XRT secondary to his CVA. A referral was given back to radiation oncology for further evaluation and treatment. Foundation one tests have been ordered and are pending at time of dictation. Return to clinic in 3 weeks with repeat laboratory work, further evaluation, and treatment planning if necessary.  2. Left pubic bone metastasis: Patient will complete his XRT on Nov 28, 2016. 3. Brain lesion: 2 mm focus concerning for  metastatic disease, but too small to characterize. Repeat brain imaging as above. Consider MRI with contrast in the future. 4. Pain: Continue fentanyl patch and oxycodone as needed. 5. Pulmonary embolism: Patient currently taking Lovenox 2 times per day. 6. Insomnia: Continue Ambien as needed. 7. Constipation: Continue MiraLAX. 8. CVA: Patient's expressive aphasia is improving. Monitor.  9. Pneumonia: Chest x-ray from today revealed improvement.  Patient expressed understanding and was in agreement with this plan. He also understands that He can call clinic at any time with any questions, concerns, or complaints.   Cancer Staging Primary cancer of right upper lobe of lung [facility]) Staging form: Lung, AJCC 7th Edition - Clinical stage from 05/09/2016: Stage IV (T3, N2, M1b) - Signed by Lloyd Huger, MD on 05/09/2016    Lloyd Huger, MD 11/10/16 1:44 PM

## 2016-11-05 ENCOUNTER — Inpatient Hospital Stay (HOSPITAL_BASED_OUTPATIENT_CLINIC_OR_DEPARTMENT_OTHER): Payer: BLUE CROSS/BLUE SHIELD | Admitting: Oncology

## 2016-11-05 ENCOUNTER — Inpatient Hospital Stay
Admission: RE | Admit: 2016-11-05 | Discharge: 2016-11-05 | Disposition: A | Discharge status: Home / Self Care | Payer: BLUE CROSS/BLUE SHIELD | Source: Ambulatory Visit | Attending: Radiation Oncology | Admitting: Radiation Oncology

## 2016-11-05 ENCOUNTER — Inpatient Hospital Stay: Payer: BLUE CROSS/BLUE SHIELD | Attending: Oncology

## 2016-11-05 VITALS — BP 134/84 | HR 116 | Temp 97.6°F | Resp 18 | Wt 153.9 lb

## 2016-11-05 DIAGNOSIS — C3411 Malignant neoplasm of upper lobe, right bronchus or lung: Secondary | ICD-10-CM

## 2016-11-05 DIAGNOSIS — E785 Hyperlipidemia, unspecified: Secondary | ICD-10-CM | POA: Insufficient documentation

## 2016-11-05 DIAGNOSIS — I129 Hypertensive chronic kidney disease with stage 1 through stage 4 chronic kidney disease, or unspecified chronic kidney disease: Secondary | ICD-10-CM | POA: Insufficient documentation

## 2016-11-05 DIAGNOSIS — N189 Chronic kidney disease, unspecified: Secondary | ICD-10-CM | POA: Diagnosis not present

## 2016-11-05 DIAGNOSIS — F329 Major depressive disorder, single episode, unspecified: Secondary | ICD-10-CM | POA: Insufficient documentation

## 2016-11-05 DIAGNOSIS — Z8673 Personal history of transient ischemic attack (TIA), and cerebral infarction without residual deficits: Secondary | ICD-10-CM

## 2016-11-05 DIAGNOSIS — E1122 Type 2 diabetes mellitus with diabetic chronic kidney disease: Secondary | ICD-10-CM | POA: Diagnosis not present

## 2016-11-05 DIAGNOSIS — K5900 Constipation, unspecified: Secondary | ICD-10-CM

## 2016-11-05 DIAGNOSIS — F419 Anxiety disorder, unspecified: Secondary | ICD-10-CM | POA: Diagnosis not present

## 2016-11-05 DIAGNOSIS — C7951 Secondary malignant neoplasm of bone: Secondary | ICD-10-CM | POA: Diagnosis not present

## 2016-11-05 DIAGNOSIS — Z87891 Personal history of nicotine dependence: Secondary | ICD-10-CM | POA: Diagnosis not present

## 2016-11-05 DIAGNOSIS — Z794 Long term (current) use of insulin: Secondary | ICD-10-CM | POA: Insufficient documentation

## 2016-11-05 DIAGNOSIS — R4701 Aphasia: Secondary | ICD-10-CM

## 2016-11-05 DIAGNOSIS — J440 Chronic obstructive pulmonary disease with acute lower respiratory infection: Secondary | ICD-10-CM | POA: Diagnosis not present

## 2016-11-05 DIAGNOSIS — Z79899 Other long term (current) drug therapy: Secondary | ICD-10-CM | POA: Insufficient documentation

## 2016-11-05 DIAGNOSIS — C7931 Secondary malignant neoplasm of brain: Secondary | ICD-10-CM

## 2016-11-05 LAB — COMPREHENSIVE METABOLIC PANEL
ALT: 18 U/L (ref 17–63)
AST: 18 U/L (ref 15–41)
Albumin: 2.9 g/dL — ABNORMAL LOW (ref 3.5–5.0)
Alkaline Phosphatase: 73 U/L (ref 38–126)
Anion gap: 9 (ref 5–15)
BUN: 18 mg/dL (ref 6–20)
CHLORIDE: 96 mmol/L — AB (ref 101–111)
CO2: 29 mmol/L (ref 22–32)
CREATININE: 1.05 mg/dL (ref 0.61–1.24)
Calcium: 9.5 mg/dL (ref 8.9–10.3)
GFR calc non Af Amer: >60 mL/min (ref >60)
Glucose, Bld: 207 mg/dL — ABNORMAL HIGH (ref 65–99)
Potassium: 3.2 mmol/L — ABNORMAL LOW (ref 3.5–5.1)
Sodium: 134 mmol/L — ABNORMAL LOW (ref 135–145)
Total Bilirubin: 1 mg/dL (ref 0.3–1.2)
Total Protein: 6.2 g/dL — ABNORMAL LOW (ref 6.5–8.1)

## 2016-11-05 LAB — CBC WITH DIFFERENTIAL/PLATELET
BASOS ABS: 0 10*3/uL (ref 0–0.1)
Basophils Relative: 0 %
EOS ABS: 0 10*3/uL (ref 0–0.7)
EOS PCT: 0 %
HCT: 28.9 % — ABNORMAL LOW (ref 40.0–52.0)
Hemoglobin: 9.6 g/dL — ABNORMAL LOW (ref 13.0–18.0)
Lymphocytes Relative: 2 %
Lymphs Abs: 0.3 10*3/uL — ABNORMAL LOW (ref 1.0–3.6)
MCH: 29.4 pg (ref 26.0–34.0)
MCHC: 33.2 g/dL (ref 32.0–36.0)
MCV: 88.6 fL (ref 80.0–100.0)
Monocytes Absolute: 1 10*3/uL (ref 0.2–1.0)
Monocytes Relative: 7 %
Neutro Abs: 13.5 10*3/uL — ABNORMAL HIGH (ref 1.4–6.5)
Neutrophils Relative %: 91 %
PLATELETS: 160 10*3/uL (ref 150–440)
RBC: 3.26 MIL/uL — AB (ref 4.40–5.90)
RDW: 15.4 % — ABNORMAL HIGH (ref 11.5–14.5)
WBC: 14.8 10*3/uL — AB (ref 3.8–10.6)

## 2016-11-05 MED ORDER — FENTANYL 25 MCG/HR TD PT72: 25.0000 ug | MEDICATED_PATCH | TRANSDERMAL | 0 refills | Status: AC

## 2016-11-05 NOTE — Progress Notes (Signed)
Complains of right arm and right leg pain. Current pain meds not controlling pain very well.

## 2016-11-06 ENCOUNTER — Ambulatory Visit
Admission: RE | Admit: 2016-11-06 | Discharge: 2016-11-06 | Disposition: A | Discharge status: Home / Self Care | Payer: BLUE CROSS/BLUE SHIELD | Source: Ambulatory Visit | Attending: Oncology | Admitting: Oncology

## 2016-11-06 DIAGNOSIS — C3411 Malignant neoplasm of upper lobe, right bronchus or lung: Secondary | ICD-10-CM | POA: Insufficient documentation

## 2016-11-06 DIAGNOSIS — J90 Pleural effusion, not elsewhere classified: Secondary | ICD-10-CM | POA: Diagnosis not present

## 2016-11-06 DIAGNOSIS — Z8701 Personal history of pneumonia (recurrent): Secondary | ICD-10-CM | POA: Diagnosis not present

## 2016-11-06 DIAGNOSIS — J189 Pneumonia, unspecified organism: Secondary | ICD-10-CM | POA: Diagnosis present

## 2016-11-07 ENCOUNTER — Encounter: Payer: Self-pay | Admitting: Vascular Surgery

## 2016-11-10 ENCOUNTER — Telehealth: Payer: Self-pay | Admitting: Unknown Physician Specialty

## 2016-11-10 ENCOUNTER — Ambulatory Visit
Admission: RE | Admit: 2016-11-10 | Discharge: 2016-11-10 | Disposition: A | Discharge status: Home / Self Care | Payer: BLUE CROSS/BLUE SHIELD | Source: Ambulatory Visit | Attending: Radiation Oncology | Admitting: Radiation Oncology

## 2016-11-10 DIAGNOSIS — C3411 Malignant neoplasm of upper lobe, right bronchus or lung: Secondary | ICD-10-CM | POA: Diagnosis present

## 2016-11-10 DIAGNOSIS — Z923 Personal history of irradiation: Secondary | ICD-10-CM | POA: Diagnosis not present

## 2016-11-10 DIAGNOSIS — Z87891 Personal history of nicotine dependence: Secondary | ICD-10-CM | POA: Diagnosis not present

## 2016-11-10 DIAGNOSIS — C7951 Secondary malignant neoplasm of bone: Secondary | ICD-10-CM | POA: Diagnosis not present

## 2016-11-10 NOTE — Telephone Encounter (Signed)
Referral form faxed to [facility].

## 2016-11-10 NOTE — Telephone Encounter (Signed)
Called and spoke with [facility] because Jacqlyn Larsen was not available. Chelsea stated that they just need a new referral for home health services because when the patient got out of the hospital, it was at the end of their period with the patient so they need to new referral to get started back with the patient. Will fill out form, have provider sign, and fax to [facility]

## 2016-11-13 ENCOUNTER — Telehealth: Payer: Self-pay | Admitting: Unknown Physician Specialty

## 2016-11-13 ENCOUNTER — Encounter: Payer: Self-pay | Admitting: Oncology

## 2016-11-13 ENCOUNTER — Encounter: Payer: Self-pay | Admitting: Unknown Physician Specialty

## 2016-11-13 LAB — CYTOLOGY - NON PAP

## 2016-11-13 NOTE — Telephone Encounter (Signed)
Skeet Simmer the PT with [facility] needs verbal order patient to have PT 1 x week for 4 weeks and also an eval for speech therapy and swallow eval for possible aphasia.  Please call Skeet Simmer at [phone]  Thanks

## 2016-11-14 ENCOUNTER — Telehealth: Payer: Self-pay | Admitting: *Deleted

## 2016-11-14 ENCOUNTER — Ambulatory Visit: Payer: BLUE CROSS/BLUE SHIELD

## 2016-11-14 ENCOUNTER — Encounter: Payer: BLUE CROSS/BLUE SHIELD | Admitting: Vascular Surgery

## 2016-11-14 NOTE — Telephone Encounter (Signed)
Called and let Skeet Simmer know that Malachy Mood said orders were OK.

## 2016-11-14 NOTE — Telephone Encounter (Addendum)
Called to states that Ralph Lopez is not going to get any better and has rapidly declined and she is unable to get him out of the house for any appts. Need to cancel PET appt She is having to dress him. She is asking for a Hospice Referral. She states she had to cancel his radiation dry run for this reason as well. She says she needs help with him and would like the referral to be made ASAP. Please advise

## 2016-11-14 NOTE — Telephone Encounter (Signed)
Given.  thanks

## 2016-11-14 NOTE — Telephone Encounter (Signed)
Routing to provider  

## 2016-11-14 NOTE — Telephone Encounter (Signed)
Yes please

## 2016-11-14 NOTE — Telephone Encounter (Signed)
Verbal order was called to Triage nurse at [facility]

## 2016-11-17 ENCOUNTER — Ambulatory Visit: Payer: BLUE CROSS/BLUE SHIELD

## 2016-11-17 ENCOUNTER — Other Ambulatory Visit: Payer: Self-pay | Admitting: *Deleted

## 2016-11-17 MED ORDER — OXYCODONE HCL 10 MG PO TABS: 10.0000 mg | ORAL_TABLET | ORAL | 0 refills | Status: AC | PRN

## 2016-11-18 ENCOUNTER — Ambulatory Visit: Payer: BLUE CROSS/BLUE SHIELD

## 2016-11-18 ENCOUNTER — Encounter: Payer: Self-pay | Admitting: Oncology

## 2016-11-19 ENCOUNTER — Telehealth (HOSPITAL_COMMUNITY): Payer: Self-pay

## 2016-11-19 ENCOUNTER — Ambulatory Visit: Payer: BLUE CROSS/BLUE SHIELD

## 2016-11-19 NOTE — Telephone Encounter (Signed)
Called to schedule f/u. Family answered the phone and stated that pt had only 6 months to live and hospice had been called in. AW

## 2016-11-20 ENCOUNTER — Ambulatory Visit: Payer: BLUE CROSS/BLUE SHIELD

## 2016-11-21 ENCOUNTER — Telehealth: Payer: Self-pay | Admitting: *Deleted

## 2016-11-21 ENCOUNTER — Ambulatory Visit: Payer: BLUE CROSS/BLUE SHIELD

## 2016-11-21 NOTE — Telephone Encounter (Signed)
Called to report that Mr Purdum passed away at 6:45 PM 12/14/2016

## 2016-11-24 ENCOUNTER — Ambulatory Visit: Payer: BLUE CROSS/BLUE SHIELD

## 2016-11-24 ENCOUNTER — Encounter: Payer: Self-pay | Admitting: Oncology

## 2016-11-25 ENCOUNTER — Ambulatory Visit: Payer: BLUE CROSS/BLUE SHIELD

## 2016-11-26 ENCOUNTER — Ambulatory Visit: Payer: BLUE CROSS/BLUE SHIELD | Admitting: Oncology

## 2016-11-26 ENCOUNTER — Other Ambulatory Visit: Payer: BLUE CROSS/BLUE SHIELD

## 2016-11-26 ENCOUNTER — Ambulatory Visit: Payer: BLUE CROSS/BLUE SHIELD

## 2016-11-27 ENCOUNTER — Ambulatory Visit: Payer: BLUE CROSS/BLUE SHIELD

## 2016-11-28 ENCOUNTER — Ambulatory Visit: Payer: BLUE CROSS/BLUE SHIELD

## 2016-12-01 ENCOUNTER — Ambulatory Visit: Payer: BLUE CROSS/BLUE SHIELD

## 2016-12-02 ENCOUNTER — Telehealth: Payer: Self-pay | Admitting: Unknown Physician Specialty

## 2016-12-02 ENCOUNTER — Ambulatory Visit: Payer: BLUE CROSS/BLUE SHIELD

## 2016-12-02 NOTE — Telephone Encounter (Signed)
Called wife to offer condolences.

## 2016-12-03 ENCOUNTER — Ambulatory Visit: Payer: BLUE CROSS/BLUE SHIELD

## 2016-12-04 ENCOUNTER — Ambulatory Visit: Payer: BLUE CROSS/BLUE SHIELD

## 2016-12-05 ENCOUNTER — Ambulatory Visit: Payer: BLUE CROSS/BLUE SHIELD

## 2016-12-08 ENCOUNTER — Ambulatory Visit: Payer: BLUE CROSS/BLUE SHIELD

## 2016-12-19 NOTE — Addendum Note (Signed)
Addendum  created 12/19/16 1301 by Rica Koyanagi, MD   Sign clinical note

## 2016-12-19 DEATH — deceased

## 2016-12-24 ENCOUNTER — Ambulatory Visit: Payer: BLUE CROSS/BLUE SHIELD | Admitting: Neurology

## 2017-01-24 ENCOUNTER — Other Ambulatory Visit: Payer: Self-pay | Admitting: Nurse Practitioner

## 2017-06-01 IMAGING — CT CT CHEST W/O CM
2 of 3 series · 15 of 36 positions shown, 18 images · non-contrast
Comparison: CT chest pain radiograph 10/17/2016, PET-CT 05/02/2016

CLINICAL DATA: RIGHT lung mass.  intubated, pneumothorax

EXAM:
CT CHEST WITHOUT CONTRAST
TECHNIQUE: Multidetector CT imaging of the chest was performed following the
standard protocol without IV contrast.

[Series 3: chest w/o 2mm st · axial · non-contrast · 0.87mm/px · z∈[+1283,+1605]mm · 12 of 189 slices shown, 15 images]
[im 14/189  mediastinal]
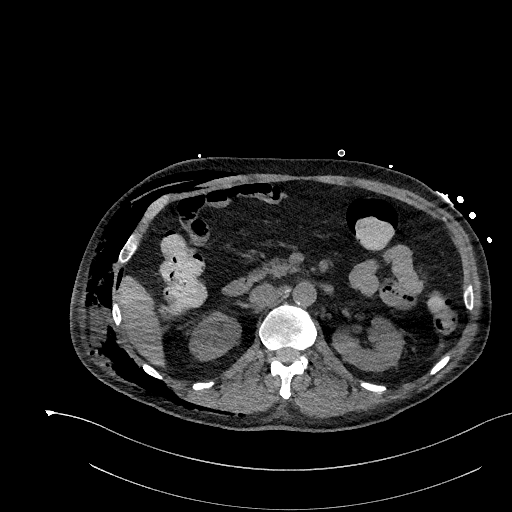
[im 14/189  lung]
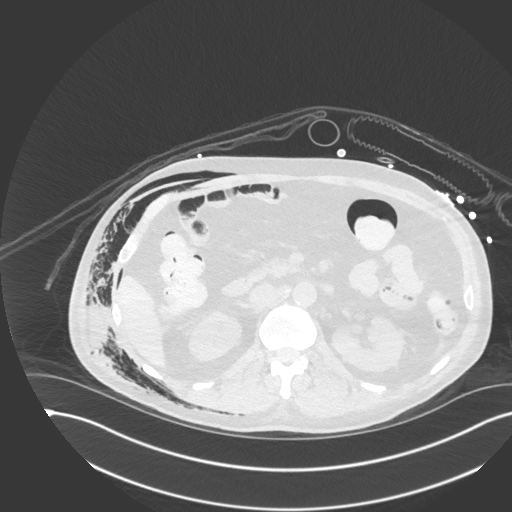
[im 28/189  lung]
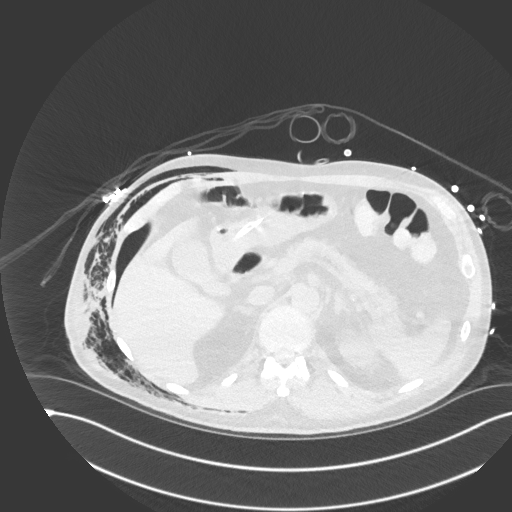
[im 42/189  lung]
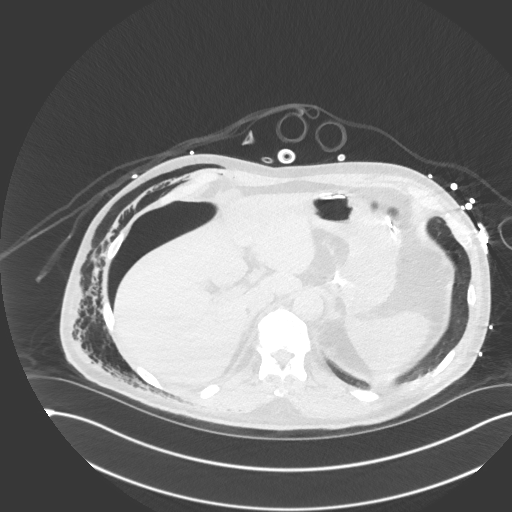
[im 56/189  lung]
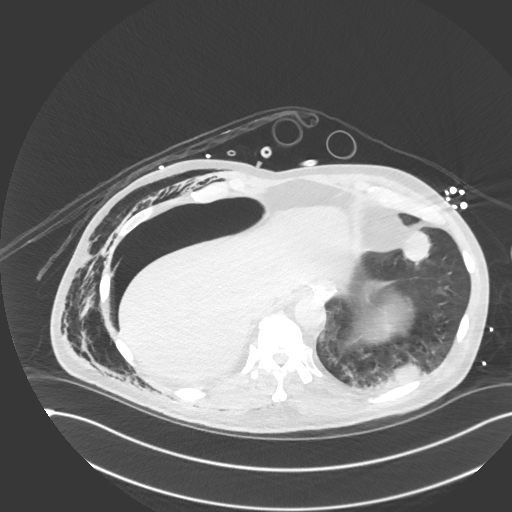
[im 70/189  mediastinal]
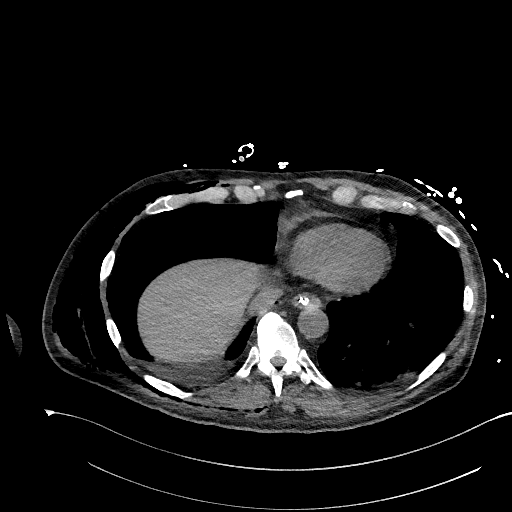
[im 70/189  lung]
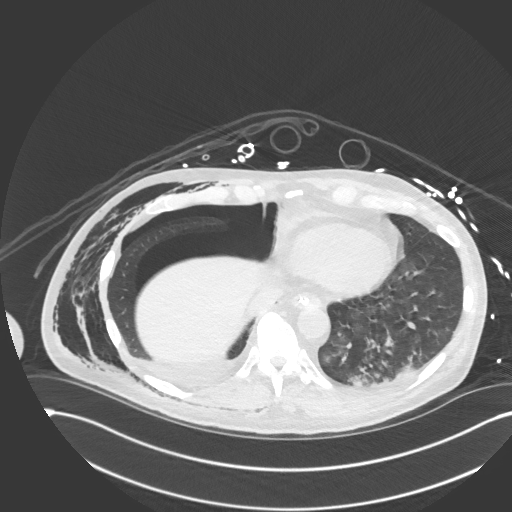
[im 84/189  lung]
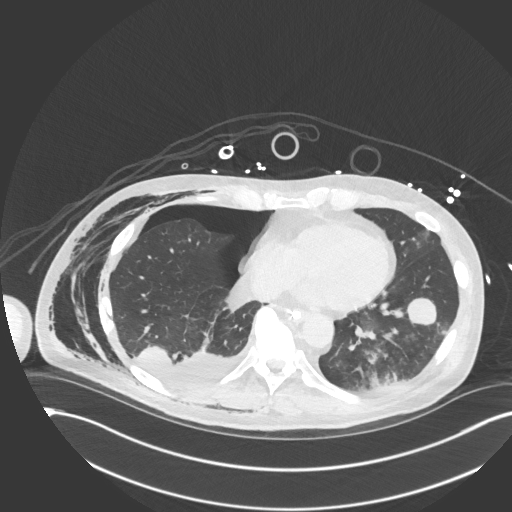
[im 105/189  lung]
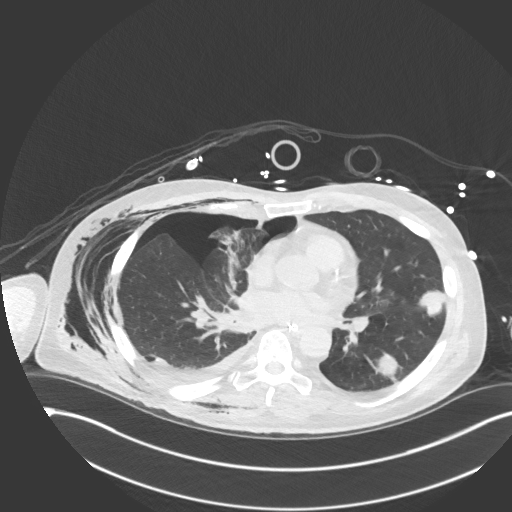
[im 119/189  lung]
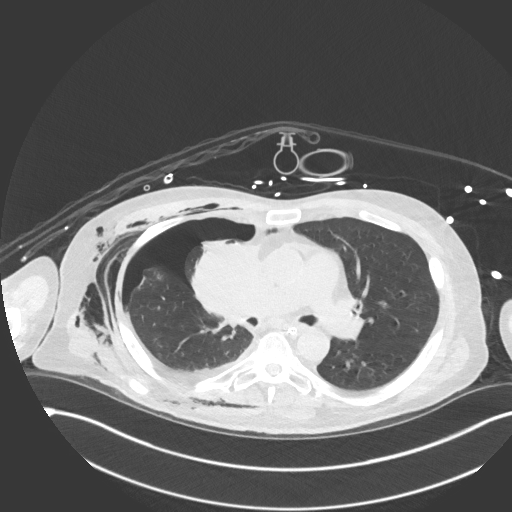
[im 133/189  mediastinal]
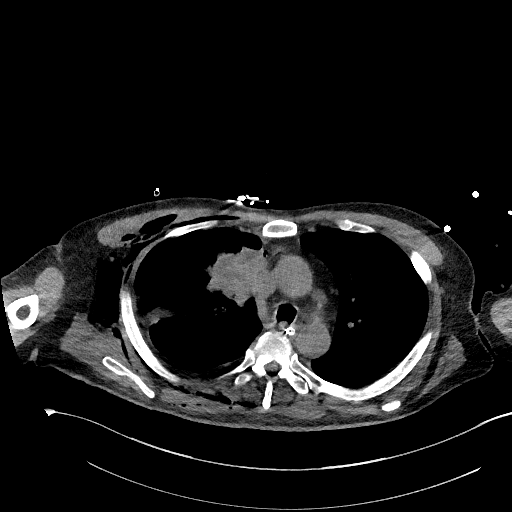
[im 133/189  lung]
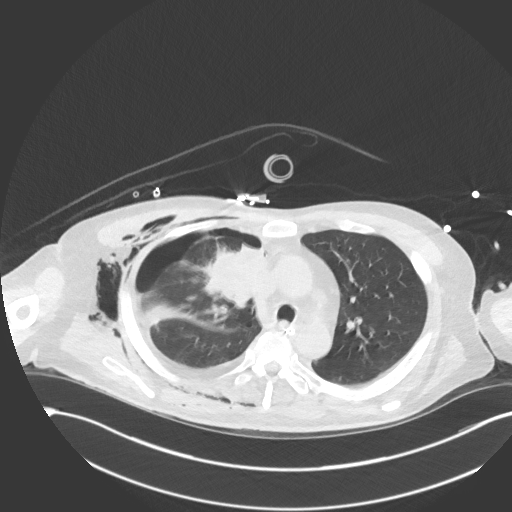
[im 147/189  lung]
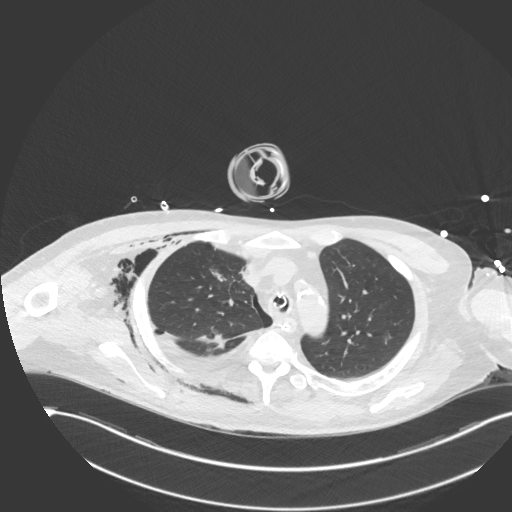
[im 161/189  lung]
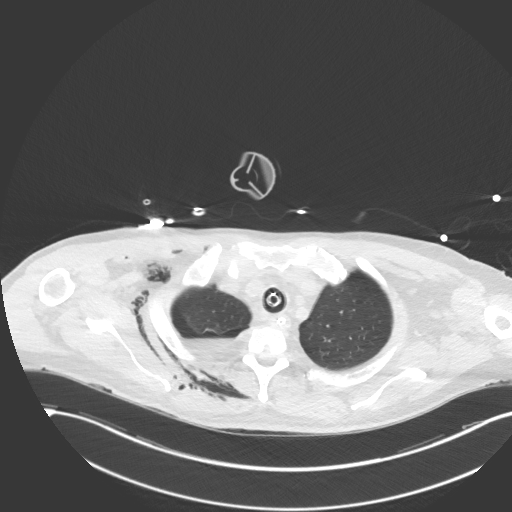
[im 175/189  lung]
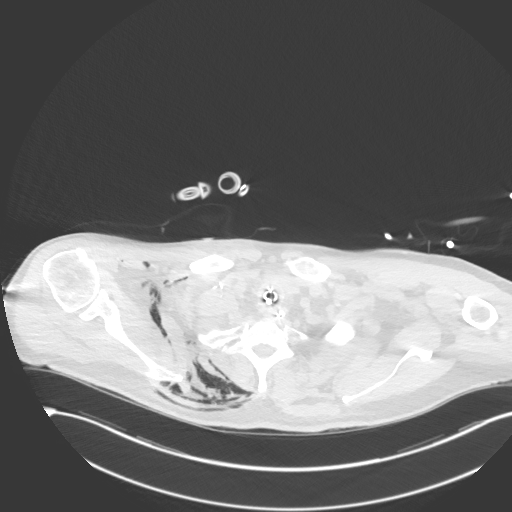

[Series 5: chest w/o 3mm st cor · coronal · non-contrast · 0.67mm/px · 3 of 101 slices shown]
[im 21/101  lung]
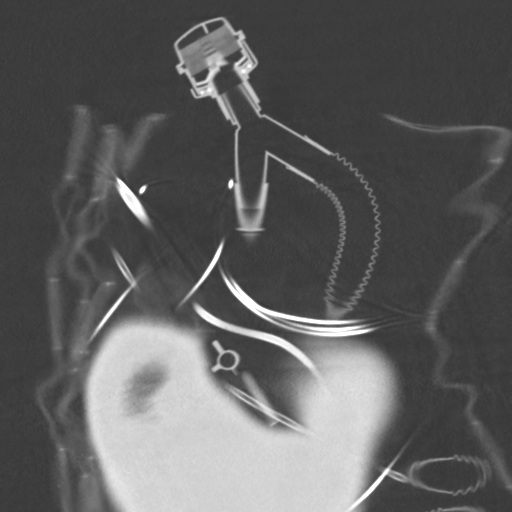
[im 41/101  lung]
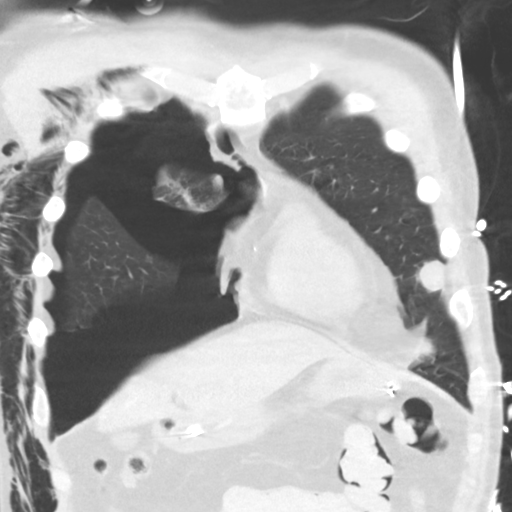
[im 61/101  lung]
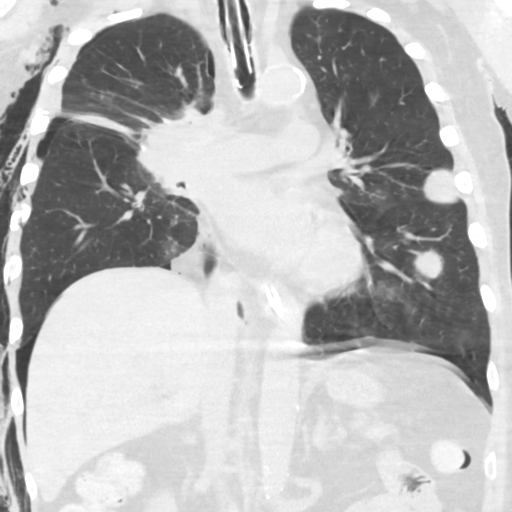

[15 of 36 positions shown; findings below may reference images not displayed]

FINDINGS: Cardiovascular: No pericardial fluid.  Mild coronary calcification.

Mediastinum/Nodes: No mediastinal lymphadenopathy.

Lungs/Pleura: Large mass centered in the RIGHT middle lobe measuring
6.5 cm. This is hypermetabolic on comparison PET-CT scan.

There are multiple rounded nodules in the LEFT lower lobe LEFT upper
lobe measuring approximately 2.5 cm each. There are 9 nodules.
Single RIGHT upper lobe nodule measuring 3 cm similar. These nodules
measure less than 1 cm on comparison PET-CT scan.

Endotracheal to in the distal trachea in good position.

Moderate size RIGHT pneumothorax.

Extensive subcutaneous gas along the RIGHT chest wall.

Upper Abdomen: NG tube in stomach

Musculoskeletal: Several nondisplaced rib fractures of the RIGHT
seventh and eighth ribs.
IMPRESSION: 1. Moderate-sized RIGHT pneumothorax as described on radiograph same
day.
2. Extensive emphysema along the RIGHT chest wall with associated
RIGHT lateral rib fractures.
3. Endotracheal tube in good position.
4. RIGHT suprahilar mass.
5. Bilateral round pulmonary metastasis significantly enlarged from
prior PET-CT scan.

These results will be called to the ordering clinician or
representative by the Radiologist Assistant, and communication
documented in the PACS or zVision Dashboard.

## 2017-06-01 IMAGING — DX DG CHEST 1V PORT
1 series · 1 of 1 positions shown · non-contrast
Comparison: 10/17/2016

CLINICAL DATA: Post endotracheal tube placement

EXAM:
PORTABLE CHEST 1 VIEW

[chest ap]
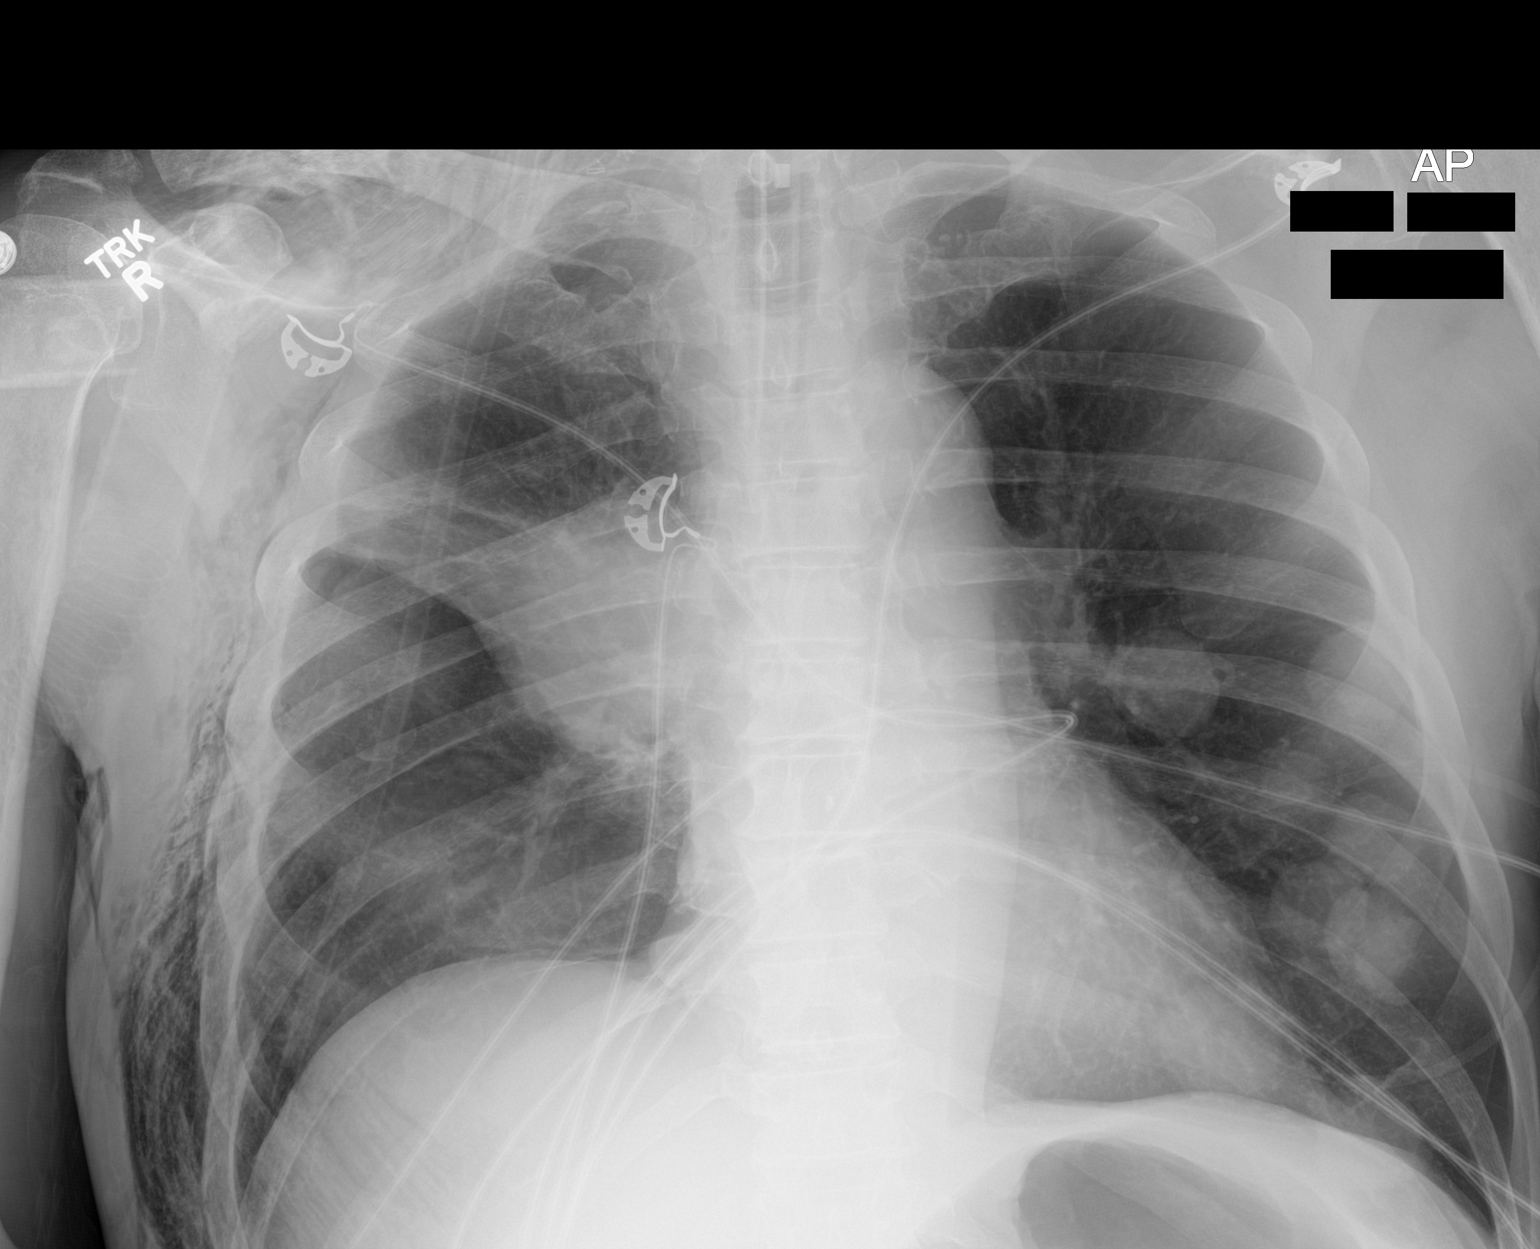

[1 of 1 positions shown; findings below may reference images not displayed]

FINDINGS: Cardiomediastinal silhouette is stable. Again noted is right hilar
mass. Nodular metastasis left lung are stable. Endotracheal tube in
place with tip 3.4 cm above the carina. Extensive subcutaneous
emphysema right chest wall again noted. Stable small right lateral
pneumothorax. Subcutaneous emphysema right supraclavicular region P
IMPRESSION: Again noted is right hilar mass. Nodular metastasis left lung are
stable. Endotracheal tube in place with tip 3.4 cm above the carina.
Extensive subcutaneous emphysema right chest wall again noted.
Stable small right lateral pneumothorax.
